# Patient Record
Sex: Female | Born: 1950 | Race: White | Hispanic: No | Smoking: Current every day smoker
Health system: Southern US, Community
[De-identification: ages and names within clinical notes are randomized; demographics above are authoritative.]

## PROBLEM LIST (undated history)

## (undated) ENCOUNTER — Telehealth: Attending: Family | Primary: Family

## (undated) ENCOUNTER — Encounter

## (undated) ENCOUNTER — Ambulatory Visit: Payer: MEDICARE | Attending: Family | Primary: Family

## (undated) ENCOUNTER — Ambulatory Visit: Payer: MEDICARE | Attending: Medical | Primary: Medical

## (undated) ENCOUNTER — Encounter: Attending: Family | Primary: Family

## (undated) ENCOUNTER — Ambulatory Visit: Payer: MEDICARE

## (undated) ENCOUNTER — Telehealth

## (undated) ENCOUNTER — Ambulatory Visit

## (undated) ENCOUNTER — Inpatient Hospital Stay

## (undated) ENCOUNTER — Telehealth: Payer: MEDICARE | Attending: Family | Primary: Family

## (undated) ENCOUNTER — Encounter: Attending: Gastroenterology | Primary: Gastroenterology

## (undated) ENCOUNTER — Ambulatory Visit: Payer: MEDICARE | Attending: Anesthesiology | Primary: Anesthesiology

## (undated) DIAGNOSIS — I10 Essential (primary) hypertension: Secondary | ICD-10-CM

## (undated) DIAGNOSIS — E559 Vitamin D deficiency, unspecified: Secondary | ICD-10-CM

## (undated) DIAGNOSIS — J449 Chronic obstructive pulmonary disease, unspecified: Secondary | ICD-10-CM

## (undated) DIAGNOSIS — R7303 Prediabetes: Secondary | ICD-10-CM

## (undated) DIAGNOSIS — K860 Alcohol-induced chronic pancreatitis: Secondary | ICD-10-CM

## (undated) DIAGNOSIS — B1920 Unspecified viral hepatitis C without hepatic coma: Secondary | ICD-10-CM

## (undated) DIAGNOSIS — Q780 Osteogenesis imperfecta: Secondary | ICD-10-CM

## (undated) DIAGNOSIS — E079 Disorder of thyroid, unspecified: Secondary | ICD-10-CM

## (undated) DIAGNOSIS — E876 Hypokalemia: Secondary | ICD-10-CM

## (undated) DIAGNOSIS — E7800 Pure hypercholesterolemia, unspecified: Secondary | ICD-10-CM

## (undated) DIAGNOSIS — G894 Chronic pain syndrome: Secondary | ICD-10-CM

## (undated) HISTORY — DX: Prediabetes: R73.03

## (undated) HISTORY — PX: PACEMAKER IMPLANT: EP1218

## (undated) HISTORY — PX: APPENDECTOMY: SHX54

## (undated) HISTORY — DX: Alcohol-induced chronic pancreatitis: K86.0

## (undated) HISTORY — DX: Chronic pain syndrome: G89.4

## (undated) HISTORY — DX: Hypokalemia: E87.6

## (undated) HISTORY — PX: TONSILLECTOMY: SUR1361

## (undated) HISTORY — DX: Unspecified viral hepatitis C without hepatic coma: B19.20

## (undated) HISTORY — PX: ABDOMINAL HYSTERECTOMY: SHX81

## (undated) HISTORY — DX: Vitamin D deficiency, unspecified: E55.9

## (undated) HISTORY — PX: ERCP: SHX60

---
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
## 2006-10-23 ENCOUNTER — Emergency Department (HOSPITAL_COMMUNITY): Admission: EM | Admit: 2006-10-23 | Discharge: 2006-10-23 | Payer: Self-pay | Admitting: Emergency Medicine

## 2015-09-06 DIAGNOSIS — J449 Chronic obstructive pulmonary disease, unspecified: Secondary | ICD-10-CM | POA: Insufficient documentation

## 2015-09-06 DIAGNOSIS — I495 Sick sinus syndrome: Secondary | ICD-10-CM

## 2015-09-06 HISTORY — DX: Sick sinus syndrome: I49.5

## 2016-06-06 DIAGNOSIS — M7502 Adhesive capsulitis of left shoulder: Secondary | ICD-10-CM

## 2016-06-06 HISTORY — DX: Adhesive capsulitis of left shoulder: M75.02

## 2016-07-08 DIAGNOSIS — E039 Hypothyroidism, unspecified: Secondary | ICD-10-CM

## 2016-07-08 DIAGNOSIS — H9191 Unspecified hearing loss, right ear: Secondary | ICD-10-CM

## 2016-07-08 HISTORY — DX: Hypothyroidism, unspecified: E03.9

## 2016-07-08 HISTORY — DX: Unspecified hearing loss, right ear: H91.91

## 2017-02-27 ENCOUNTER — Ambulatory Visit (HOSPITAL_COMMUNITY): Payer: Self-pay | Admitting: Psychiatry

## 2017-04-07 ENCOUNTER — Ambulatory Visit
Admission: RE | Admit: 2017-04-07 | Discharge: 2017-04-07 | Disposition: A | Discharge status: Home / Self Care | Payer: TRICARE (CHAMPUS) | Attending: Obstetrics & Gynecology | Admitting: Obstetrics & Gynecology

## 2017-04-07 DIAGNOSIS — Z124 Encounter for screening for malignant neoplasm of cervix: Principal | ICD-10-CM

## 2017-04-20 ENCOUNTER — Ambulatory Visit
Admission: RE | Admit: 2017-04-20 | Discharge: 2017-04-20 | Disposition: A | Discharge status: Home / Self Care | Payer: TRICARE (CHAMPUS) | Attending: Gastroenterology | Admitting: Gastroenterology

## 2017-04-20 DIAGNOSIS — B182 Chronic viral hepatitis C: Principal | ICD-10-CM

## 2017-04-28 MED ORDER — LEDIPASVIR 90 MG-SOFOSBUVIR 400 MG TABLET
ORAL_TABLET | Freq: Every day | ORAL | 2 refills | 0 days | Status: CP
Start: 2017-04-28 — End: 2017-04-30

## 2017-04-29 NOTE — Unmapped (Signed)
Per test claim for Harvoni at the [facility], patient needs Medication Assistance Program for High Cop[place].

## 2017-04-30 ENCOUNTER — Ambulatory Visit: Admission: RE | Admit: 2017-04-30 | Discharge: 2017-04-30 | Payer: TRICARE (CHAMPUS)

## 2017-04-30 DIAGNOSIS — Z1211 Encounter for screening for malignant neoplasm of colon: Principal | ICD-10-CM

## 2017-04-30 MED ORDER — LEDIPASVIR 90 MG-SOFOSBUVIR 400 MG TABLET
ORAL_TABLET | Freq: Every day | ORAL | 2 refills | 0.00000 days | Status: CP
Start: 2017-04-30 — End: 2018-05-04

## 2017-04-30 NOTE — Unmapped (Signed)
Initial Counseling for HCV Treatment     Planned regimen: Harvoni (ledipasvir/sofosbuvir 90/400mg ) x 12 weeks  Planned start date: TBD, pending delivery    Pharmacy: [facility] [phone]    Patient is ready to start Harvoni.     Following topics were discussed during counseling:      1. Indications for medication, dosage and administration.     A. Harvoni 90/400mg  1 tablet to take daily with or without food. Patient plans to take in the mornings.    2. Common side effects of medications and management strategies. (fatigue, headache)    3. Importance of adherence to regimen, follow-up clinic visits and lab monitoring.     A. Asked patient to call [facility] [phone] to establish start date for treatment and to schedule appointment 4 weeks before starting treatment. Also sent a message to [place] [facility] to call patient for follow up appointment.    4. Drug-drug interaction.      A. Current medications have been reviewed and assessed for possible interaction. Endorses Tums prn.  We discussed the mechanism of drug-drug interaction with acid lowering agents. Instructed to separate by at least 4 hours. Advised to check with MD or pharmacist before taking any OTC/herbal medications, with emphasis regarding indigestion/heartburn medications.  Denies use of herbal medication such as milk thistle or St. John's wart.  Allergies have been verified.    5. Importance of informing pharmacy and clinic of updated contact information. Stressed importance of being able to reach over the phone to set up refills. Advised patient to call pharmacy when down to about 7-14 day supply left to ensure there's no interruption in therapy.    Patient verbalized understanding. Provided contact information for any questions/concerns.     Park Breed, Pharm D., BCPS, CGP, CPP  [facility]  [address]  [place], [place] [address]  [phone]

## 2017-05-07 NOTE — Unmapped (Signed)
Reason for call: Patient had questions r/t Harvoni    Genotype? 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 Patient changed start date to 05/13/17 and stated she will take Harvoni in the morning with her other medications. Patient is aware if she needs to take Antacids she will separate by at least 4 hrs.   Patient stated she takes Tylenol PM and was not sure if she could take the medication. Educated patient that she may take up to 2,000 mg of Tylenol per day. Patient is aware that Tylenol can be used for potential headaches. Patient verbalized understanding.     Patient was instructed to call and make follow up appointment with [place] [facility] at [phone]. Patient stated transportation is an issue and her car is in bad shape and she has to pay someone gas money. Patient was hoping she could have labs completed in [facility]. Notified patient that I will address this with the Provider and get back with her.         Vertell Limber RN, BSN  Nursing Care Coordinator   Pharmacy Adult GI Medicine  [facility]  [address]   [place], [place] [address]  [phone]

## 2017-05-08 NOTE — Unmapped (Signed)
Reason for call: Patient returned call for TW #4 follow up questions    HCV   Genotype? 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 Notified patient that the Providers would like her to call Vincenza Hews at phone number [phone] to schedule the TW # 4 office visit. Providers will be able to go over  ultra sound results and provide assessment and labs at time of visit. Notified patient that I see she has [employer]. Instructed patient to call the insurance to see if she quantifies for transportation assistance. Patient stated her husband was vested at the [place] and she continues with the insurance benefits. Stressed the importance of follow up care. Education provided on every 6 months visits for [facility] monitoring. Patient verbalized understanding.         Vertell Limber RN, BSN  Nursing Care Coordinator   Pharmacy Adult GI Medicine  [facility]  [address]   [place], [place] [address]  [phone]

## 2017-05-12 ENCOUNTER — Ambulatory Visit
Admission: RE | Admit: 2017-05-12 | Discharge: 2017-05-12 | Disposition: A | Discharge status: Home / Self Care | Payer: TRICARE (CHAMPUS) | Attending: Obstetrics & Gynecology | Admitting: Obstetrics & Gynecology

## 2017-05-12 ENCOUNTER — Ambulatory Visit
Admission: RE | Admit: 2017-05-12 | Discharge: 2017-05-12 | Disposition: A | Discharge status: Home / Self Care | Payer: TRICARE (CHAMPUS)

## 2017-05-12 DIAGNOSIS — R87610 Atypical squamous cells of undetermined significance on cytologic smear of cervix (ASC-US): Principal | ICD-10-CM

## 2017-05-12 DIAGNOSIS — B182 Chronic viral hepatitis C: Principal | ICD-10-CM

## 2017-05-12 DIAGNOSIS — R87810 Cervical high risk human papillomavirus (HPV) DNA test positive: Secondary | ICD-10-CM

## 2017-05-12 NOTE — Unmapped (Signed)
Colposcopy: What to Expect at Home  Your Recovery  You may feel some soreness in your vagina for a day or two if you had a biopsy. Some vaginal bleeding or discharge is normal for up to a week after a biopsy. The discharge may be dark-colored if a solution was put on your cervix. You can use a sanitary pad for the bleeding.  It may take a week or two for you to get the test results.  This care sheet gives you a general idea about how long it will take for you to recover. But each person recovers at a different pace. Follow the steps below to feel better as quickly as possible.  How can you care for yourself at home?  Activity  ?? ?? You can return to work and most daily activities right after the test.   Exercise  ?? ?? Do not exercise for 1 day after the test.   Medicines  ?? ?? Your doctor will tell you if and when you can restart your medicines. He or she will also give you instructions about taking any new medicines.   ?? ?? If you take blood thinners, such as warfarin (Coumadin), clopidogrel (Plavix), or aspirin, be sure to talk to your doctor. He or she will tell you if and when to start taking those medicines again. Make sure that you understand exactly what your doctor wants you to do.   ?? ?? Take an over-the-counter pain medicine, such as acetaminophen (Tylenol), ibuprofen (Advil, Motrin), or naproxen (Aleve). Be safe with medicines. Read and follow all instructions on the label. Do not take two or more pain medicines at the same time unless the doctor told you to. Many pain medicines have acetaminophen, which is Tylenol. Too much acetaminophen (Tylenol) can be harmful.   Other instructions  ?? ?? Use a pad if you have some bleeding.   ?? ?? Do not douche, have sexual intercourse, or use tampons for 1 week if you had a biopsy. This will allow time for your cervix to heal.   ?? ?? You can take a bath or shower anytime after the test.   Follow-up care is a key part of your treatment and safety. Be sure to make and go to all appointments, and call your doctor if you are having problems. It's also a good idea to know your test results and keep a list of the medicines you take.  When should you call for help?  Call your doctor now or seek immediate medical care if:  ?? ?? You have severe vaginal bleeding. This means that you are soaking through your usual pads or tampons each hour for 2 or more hours.   ?? ?? You have pain that does not get better after you take pain medicine.   ?? ?? You have signs of infection, such as:  ?? Increased pain.  ?? Bad-smelling vaginal discharge.  ?? A fever.   ??Watch closely for any changes in your health, and be sure to contact your doctor if:  ?? ?? You have questions or concerns.   Where can you learn more?  Go to [facility] at [email or web address]  Select Preferences in the upper right hand corner, then select Health Library under Resources. Enter M523 in the search box to learn more about Colposcopy: What to Expect at Home.  Current as of: Feb 15, 2016  Content Version: 11.7  ?? 2006-2018 [employer]. Care instructions adapted under license by [facility]. If  you have questions about a medical condition or this instruction, always ask your healthcare professional. Healthwise, Incorporated disclaims any warranty or liability for your use of this information.

## 2017-05-12 NOTE — Unmapped (Signed)
CC: ASCUS, positive HPV    HPI:   Zoe Rice is a 66 y.o. y/o G3P1021  who presents today for evaluation and management of a Pap smear on 04/07/2017 that showed ASCUS with positive HPV.      Patient has a history of abnormal Pap smears with cryotherapy in the past. We discussed in depth her pap smear result and the connection with the Human Papilloma Virus.  We also discussed the importance of routine follow up and that dysplasia is a condition that can lead to cancer if ignored.  The risks and benefits of the procedure and written informed consent obtained.    Past obstetric, gynecologic, medical, surgical, family, and social history reviewed and updated.     LMP: No LMP recorded. Patient is postmenopausal.     ROS: Otherwise negative across the balance of 10 systems except as noted per HPI.      Physical Exam:  -Vitals:   BP 84/62  - Ht 162.6 cm (5' 4)  - Wt 54.4 kg (120 lb)  - BMI 20.60 kg/m??   Constitutional: Well-developed, well-nourished female in no acute distress  Respiratory: Clear to auscultation bilaterally. Good air movement with normal work of   breathing.  Cardiovascular: Regular rate and rhythm. Extremities grossly normal, nontender with no edema; pulses regular  Gastrointestinal: Soft, nontender, nondistended. No masses or hernias appreciated. No hepatosplenomegaly. No fluid wave. No rebound or guarding.  Genitourinary:     -EGBUS are normal.         -Vagina is pink, well rugated vaginal mucosa, and without gross lesions: yes         -Cervix:           -Pap smear performed today: no  Procedure Note:          -Colposcopy performed with 4% acetic acid.               -Satisfactory: yes               -Lesion(s) Identified:yes date mosaicism               -Location(s): 9 o'clock.               -Biopsy performed at this area, postmenopausal tissue very firm and difficult to biopsy. Hemostasis identified afterwards with monsels               -ECC performed        Assessment/Plan:      Zoe Rice is a 66 y.o. female [phone] with ASCUS, positive HPV. Colposcopy with ECC and directed biopsies were performed today. She'll follow-up next week to review the results.    Chaperone was present for entire exam and procedure

## 2017-05-25 NOTE — Unmapped (Signed)
Follow-Up Counseling for HCV Treatment    Regimen: Harvoni (ledipasvir/sofosbuvir 90/400mg ) x 12 weeks  Start Date: 05/13/17  Completed Treatment Week #2    Pharmacy: [facility] [phone]    Following topics were reviewed during the phone call:  Patient Counseling    Counseled the patient on the following:  doses and administration discussed, possible adverse effects and management discussed, lab monitoring and follow-up discussed, therapeutic rationale discussed, adherence and missed doses discussed, pharmacy contact information discussed         1. Medication administration - Takes Harvoni every morning from 7:30am-8am    2. Importance of adherence -   Medication Adherence    Patient reported X missed doses in the last month:  0  Specialty Medication:  Harvoni  Patient is on additional specialty medications:  No  Patient is on more than two specialty medications:  No  Any gaps in refill history greater than 2 weeks in the last 3 months:  no  Demonstrates understanding of importance of adherence:  yes  Informant:  patient  Reliability of informant:  fairly reliable  Provider-estimated medication adherence level:  76-89%  Patient is at risk for Non-Adherence:  No  Reasons for non-adherence:  patient forgets  Confirmed plan for next specialty medication refill:  outside pharmacy  Medication Assistance Program  Refill Coordination  Shipping Information       Pill count over the phone revealed #16 tablets which is inappropriate, pt should have #14 tablets.  Pt does not recall missing 2 doses and thought she had been 100% adherent.  She currently takes her Harvoni every morning with her other medications, suggested she add a reminder/alarm on her phone.  Also suggested she marks off on her calendar after taking her Harvoni dose to double check that she has taken daily dose.  Pt verbalized understanding and will implement new adherence tools.     3. Side effects - Pt reported initial fatigue and headaches at the start of treatment but both symptoms have now subsided.  Pt reports taking 500mg  of acetaminophen once at the start of treatment for headaches which relieved headache pain, but has not had to take acetaminophen for headaches again.  Suggested patient is drinking enough fluids, and to rest and take short naps if need be.    Adverse Effects    Fatigue:  Pos (Comment: Pt reports fatigue at the start of treatment but symptoms have now subsided.)  Headaches:  Pos (Comment: Pt reports mild headaches but have subsided on its own.  Took 500mg  of acetaminophen one time to relieve symptoms.)         4. Drug-drug interaction -  Pt says she takes 500mg -1,000mg  of acetaminophen before she sleeps every night but is aware not to exceed 2,000mg /day, including if she needs to take acetaminophen for headache. Pt does not plan to need to take acetaminophen again for headaches.  Pt says she had a stomachache unrelated to Harvoni, and took 1 Pepto-Bismol tablet 5 hours after Harvoni dose.  Advised patient that this is appropriate, and confirmed that she has been trying to avoid all antacids/heartburn medications in general.  On another occasion, pt took Doculax in the evening to normalize bowel movement, but says that was a one time dose.  Advised patient that was appropriate, and to maintain a high-fiber diet and drink enough fluids to keep regular bowel movements.   Drug Interactions    Drug interactions evaluated:  yes  Clinically relevant drug interactions identified:  yes  Interactions list:  Tums   Drug management plan:  Instructed to separate by 4 hours.   Provided the patient with educational material regarding drug interactions:  not applicable         5. Follow up - Has follow up appointment scheduled in HCV treatment clinic on 06/10/17 with DNP Owens Shark.  Advised patient to call pharmacy when down to about 7 day supply left to ensure there's no interruption in therapy.  Provided patient with [facility]'s phone number.       All questions were answered. Pt verbalized understanding.     Casimer Bilis, PharmD Candidate  May 26, 2017 2:20 PM    I reviewed the findings, assessment and plan with Pharmacy Student and agree with the findings and plan as documented in the note.  Park Breed, Pharm D., BCPS, BCGP, CPP  [facility]  [address]  [place], [place] [address]  [phone]

## 2017-05-27 ENCOUNTER — Ambulatory Visit
Admission: RE | Admit: 2017-05-27 | Discharge: 2017-05-27 | Payer: TRICARE (CHAMPUS) | Attending: Obstetrics & Gynecology | Admitting: Obstetrics & Gynecology

## 2017-05-27 ENCOUNTER — Ambulatory Visit: Admission: RE | Admit: 2017-05-27 | Discharge: 2017-05-27 | Payer: TRICARE (CHAMPUS)

## 2017-05-27 DIAGNOSIS — Z45018 Encounter for adjustment and management of other part of cardiac pacemaker: Secondary | ICD-10-CM

## 2017-05-27 DIAGNOSIS — R87810 Cervical high risk human papillomavirus (HPV) DNA test positive: Secondary | ICD-10-CM

## 2017-05-27 DIAGNOSIS — Z720 Tobacco use: Secondary | ICD-10-CM

## 2017-05-27 DIAGNOSIS — R0989 Other specified symptoms and signs involving the circulatory and respiratory systems: Principal | ICD-10-CM

## 2017-05-27 DIAGNOSIS — I495 Sick sinus syndrome: Secondary | ICD-10-CM

## 2017-05-27 DIAGNOSIS — R87610 Atypical squamous cells of undetermined significance on cytologic smear of cervix (ASC-US): Principal | ICD-10-CM

## 2017-05-27 NOTE — Unmapped (Signed)
05/27/2017    Zoe Rice date of birth December 29, 1950 follows up for pacemaker placed for sick sinus syndrome in 2004, with generator change out 2017. She has been interrogated since then and the device is functioning fine. She has no complaints of palpitations. She does have dizziness and lightheadedness, reports very variable blood pressures, but even when ill and not able to eat and drink normally she continues to take hydrochlorothiazide 25 mg and lisinopril 40. I suggested she wait till her noon meal to check her blood pressure again and take those if her pressure is above the current readings 110/76. Not sure she needs a diuretic at all given her weight. She is losing weight. She says she stays hydrated but she was orthostatic here in the office. She did not fall or lose her balance, but going from a chair up to the exam table she was lightheaded and the initial blood pressure was low at 98/64.    Echocardiogram done prior to the device change in early 2017 was essentially normal. There was some tricuspid regurgitation and she has likely had it for RV systolic pressure elevation and right sided fluid accumulation. She has some problems with her liver but is following up at [facility]. She started Harvoni for hep C this year. Not sure why there are muscle spasms requiring baclofen. She is also now mildly hypothyroid. She continues to smoke a bit less than a pack a day.     In any case we are making no changes to her blood pressure medicines other than for her to monitor more carefully and to adjust as needed. She is welcome to call anytime there is a question.    On exam, thin 66 year old woman who is alert pleasant and very talkative. She lives alone but manages fine. Initial blood pressure 98/64 in the left arm, on my repeat I got 110/76 in both arms heart sounds are regular chest is slightly coarse but there are no regional variations, no rales rhonchi or wheezes abdomen is flat soft nontender lower extremities no edema. She weighs 118 pounds. She says she is losing weight.    Assessment: Sick sinus syndrome history, status post [employer] dual-chamber pacemaker 2004 with change out 2017.   Hypertension with low readings occasionally possibly volume related.   Normal LV size and function on echo January 2017.   Hepatitis C, being treated.   Hypothyroidism treated.    Plan no change, follow-up with Dr. Rudolpho Sevin in one year and in the device clinic as scheduled

## 2017-05-27 NOTE — Unmapped (Signed)
Chief Complaint   Patient presents with   ??? Follow-up     Colpo results       Zoe Rice is here for Follow-up of colposcopy performed on 05/12/2017. She has no new problems or complaints. We reviewed her biopsy results which show reactive atypia but no dysplasia. There is correlation between her Pap smear in the biopsies. Given these findings, I recommended that we repeat a Pap smear with HPV testing in 1 year. She voiced understanding and understands the importance of follow-up. All questions have been answered.         Current Outpatient Prescriptions:   ???  albuterol (PROVENTIL HFA;VENTOLIN HFA) 90 mcg/actuation inhaler, Inhale 2 puffs every four (4) hours as needed for wheezing or shortness of breath (and cough)., Disp: 54 g, Rfl: 1  ???  alendronate (FOSAMAX) 70 MG tablet, Take 1 tablet (70 mg total) by mouth every seven (7) days., Disp: 12 tablet, Rfl: 1  ???  baclofen (LIORESAL) 10 MG tablet, Take 1 tablet (10 mg total) by mouth every eight (8) hours as needed for muscle spasms., Disp: 90 tablet, Rfl: 0  ???  calcium carbonate (TUMS) 200 mg calcium (500 mg) chewable tablet, Chew 1 tablet (500 mg total) 2 (two) times a day with meals., Disp: 60 tablet, Rfl: 0  ???  cholecalciferol, vitamin D3, 400 unit Tab, Take 1 tablet (400 Units total) by mouth 2 (two) times a day with meals., Disp: 60 tablet, Rfl: 0  ???  gabapentin (NEURONTIN) 300 MG capsule, Take 1 capsule (300 mg total) by mouth Two (2) times a day., Disp: 180 capsule, Rfl: 0  ???  hydroCHLOROthiazide (HYDRODIURIL) 25 MG tablet, Take 1 tablet (25 mg total) by mouth daily., Disp: 90 tablet, Rfl: 1  ???  ledipasvir 90 mg-sofosbuvir 400 mg (HARVONI) tablet, Take 1 tablet by mouth daily., Disp: 28 tablet, Rfl: 2  ???  levothyroxine (SYNTHROID, LEVOTHROID) 25 MCG tablet, Take 1 tablet (25 mcg total) by mouth daily at 0600., Disp: 90 tablet, Rfl: 1  ???  lisinopril (PRINIVIL,ZESTRIL) 40 MG tablet, Take 1 tablet (40 mg total) by mouth daily., Disp: 90 tablet, Rfl: 1  ??? tiotropium bromide 1.25 mcg/actuation Mist, Inhale 2.5 mcg daily., Disp: 225 g, Rfl: 1    Allergies   Allergen Reactions   ??? Antihistamines - Alkylamine Nausea Only   ??? Aspirin Nausea Only     GI upset       Past Medical History:   Diagnosis Date   ??? Abnormal Pap smear of cervix    ??? Abuse, drug or alcohol    ??? Alcoholism (CMS-HCC)    ??? Arthritis    ??? COPD (chronic obstructive pulmonary disease) (CMS-HCC)    ??? Coronary artery disease    ??? GERD (gastroesophageal reflux disease)    ??? Hepatitis C 1972   ??? HPV (human papilloma virus) infection    ??? Hyperlipidemia    ??? Hypertension    ??? Infectious viral hepatitis 1972   ??? Myocardial infarction (CMS-HCC)    ??? Post-menopausal    ??? Shortness of breath    ??? SSS (sick sinus syndrome) (CMS-HCC)        Past Surgical History:   Procedure Laterality Date   ??? APPENDECTOMY     ??? INSERT / REPLACE / REMOVE PACEMAKER  2004   ??? ORIF ANKLE FRACTURE Right    ??? PR REMVL PERM PM PLS GEN W/REPL PLSE GEN 2 LEAD SYS N/A 02/20/2016    Procedure:  dppm c/o DEVICE Radio broadcast assistant;  Surgeon: Sandy Salaam, MD;  Location: [facility] EP;  Service: Cardiology   ??? TONSILLECTOMY     ??? TUBAL LIGATION         OB History   Gravida Para Term Preterm AB Living   3 1 1   2 1    SAB TAB Ectopic Molar Multiple Live Births   1   1            # Outcome Date GA Lbr Len/2nd Weight Sex Delivery Anes PTL Lv   3 SAB            2 Ectopic            1 Term                   Social History     Social History   ??? Marital status: Widowed     Spouse name: N/A   ??? Number of children: N/A   ??? Years of education: N/A     Social History Main Topics   ??? Smoking status: Current Every Day Smoker     Packs/day: 0.50     Years: 50.00     Types: Cigarettes   ??? Smokeless tobacco: Never Used      Comment: Per pt hx of smoking for 50 yrs    ??? 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    ??? Drug use: Yes     Types: Hydrocodone   ??? Sexual activity: Not on file     Other Topics Concern   ??? Not on file     Social History Narrative   ??? No narrative on file       Family History   Problem Relation Age of Onset   ??? Emphysema Mother    ??? Cancer Mother         LUNG   ??? COPD Mother    ??? Hypothyroidism Mother    ??? Heart attack Father    ??? Early death Father    ??? Hypothyroidism Daughter    ??? Hypothyroidism Grandchild          Review Of Systems   REVIEW OF SYSTEMS:  General: no fevers  Skin: no rashes, lesions, itching,  HEENT: no frequent headaches, hearing changes, sore throat, dental problems, sinus problems    Lymph: no tender or swollen lymph nodes   Breasts: no lumps, nipple discharge, breast pain  Resp: no SOB, cough, wheezing  Cardio: no chest pain, palpitations, edema  [id number], bloating, reflux, blood in stool, leakage of stool   GU: no frequency, dysuria, flank pain, hematuria, incontinence, retention        Musk: no weakness, arthralgia, joint swelling, ROM limitations  Endocrine: no polydypsia, polyphagia, polyuria, heat/cold intolerance, hot flashes, dry skin  Psych: no depression, anxiety, panic attacks           PHYSICAL EXAM:  There were no vitals taken for this visit.  Constitutional: Well-developed, well-nourished female in no acute distress    Assessment/Plan:      Zoe Rice is a 66 y.o. female [phone] for ASCUS, positive HPV. Colposcopy shows reactive atypia with no dysplasia. We will repeat a Pap smear with HPV testing in 1 year.    Problem List Items Addressed This Visit     None  Visit Diagnoses     ASCUS with positive high risk HPV cervical    -  Primary          Return in about 1 year (around 05/27/2018) for Annual physical.

## 2017-06-03 NOTE — Unmapped (Signed)
Treatment lab protocol - orders to [facility].  Will mail copy of orders to patient.

## 2017-06-10 ENCOUNTER — Ambulatory Visit
Admission: RE | Admit: 2017-06-10 | Discharge: 2017-06-10 | Disposition: A | Discharge status: Home / Self Care | Payer: TRICARE (CHAMPUS) | Attending: Family | Admitting: Family

## 2017-06-10 DIAGNOSIS — B182 Chronic viral hepatitis C: Principal | ICD-10-CM

## 2017-06-10 DIAGNOSIS — Z5181 Encounter for therapeutic drug level monitoring: Secondary | ICD-10-CM

## 2017-06-10 DIAGNOSIS — R768 Other specified abnormal immunological findings in serum: Secondary | ICD-10-CM

## 2017-06-10 LAB — URINALYSIS
BACTERIA: NONE SEEN /HPF
BILIRUBIN UA: NEGATIVE
BLOOD UA: NEGATIVE
GLUCOSE UA: NEGATIVE
KETONES UA: NEGATIVE
NITRITE UA: NEGATIVE
PH UA: 5.5 (ref 5.0–9.0)
SPECIFIC GRAVITY UA: 1.019 (ref 1.003–1.030)
SQUAMOUS EPITHELIAL: 1 /HPF (ref 0–5)
UROBILINOGEN UA: 0.2
WBC UA: 17 /HPF — ABNORMAL HIGH (ref 0–5)

## 2017-06-10 LAB — BASIC METABOLIC PANEL
ANION GAP: 14 mmol/L (ref 9–15)
BLOOD UREA NITROGEN: 19 mg/dL (ref 7–21)
BUN / CREAT RATIO: 22
CALCIUM: 9.8 mg/dL (ref 8.5–10.2)
CHLORIDE: 103 mmol/L (ref 98–107)
CO2: 24 mmol/L (ref 22.0–30.0)
CREATININE: 0.86 mg/dL (ref 0.60–1.00)
EGFR MDRD AF AMER: >=60 mL/min/{1.73_m2} (ref >=60)
GLUCOSE RANDOM: 99 mg/dL (ref 65–179)
POTASSIUM: 4.2 mmol/L (ref 3.5–5.0)
SODIUM: 141 mmol/L (ref 135–145)

## 2017-06-10 LAB — HEPATIC FUNCTION PANEL
ALKALINE PHOSPHATASE: 81 U/L (ref 38–126)
ALT (SGPT): 28 U/L (ref 15–48)
AST (SGOT): 33 U/L (ref 14–38)
BILIRUBIN DIRECT: 0.4 mg/dL (ref 0.00–0.40)

## 2017-06-10 LAB — TOXICOLOGY SCREEN, URINE
BARBITURATE SCREEN URINE: <200
CANNABINOID SCREEN URINE: <20
COCAINE(METAB.)SCREEN, URINE: <150
METHADONE SCREEN, URINE: <300

## 2017-06-10 LAB — CBC W/ AUTO DIFF
EOSINOPHILS ABSOLUTE COUNT: 0.2 10*9/L (ref 0.0–0.4)
HEMATOCRIT: 42.1 % (ref 36.0–46.0)
HEMOGLOBIN: 14.1 g/dL (ref 12.0–16.0)
LYMPHOCYTES ABSOLUTE COUNT: 2.2 10*9/L (ref 1.5–5.0)
MEAN CORPUSCULAR HEMOGLOBIN: 33.3 pg (ref 26.0–34.0)
MEAN CORPUSCULAR VOLUME: 99.3 fL (ref 80.0–100.0)
MEAN PLATELET VOLUME: 9.9 fL (ref 7.0–10.0)
MONOCYTES ABSOLUTE COUNT: 0.6 10*9/L (ref 0.2–0.8)
NEUTROPHILS ABSOLUTE COUNT: 6.1 10*9/L (ref 2.0–7.5)
PLATELET COUNT: 414 10*9/L (ref 150–440)
RED BLOOD CELL COUNT: 4.24 10*12/L (ref 4.00–5.20)
WBC ADJUSTED: 9.3 10*9/L (ref 4.5–11.0)

## 2017-06-10 LAB — INR: Lab: 0.95

## 2017-06-10 LAB — GLUCOSE RANDOM: Glucose:MCnc:Pt:Ser/Plas:Qn:: 99

## 2017-06-10 LAB — LYMPHOCYTES ABSOLUTE COUNT: Lab: 2.2

## 2017-06-10 LAB — NITRITE UA: Lab: NEGATIVE

## 2017-06-10 LAB — OPIATE SCREEN URINE

## 2017-06-10 LAB — BILIRUBIN TOTAL: Bilirubin:MCnc:Pt:Ser/Plas:Qn:: 0.6

## 2017-06-10 LAB — PROTIME-INR: PROTIME: 10.8 s (ref 10.2–12.8)

## 2017-06-10 NOTE — Unmapped (Addendum)
1.  Laboratory studies done today specifically to check liver enzymes and your hepatitis C viral level.   2.  Office follow up nine weeks, sooner if any concerns. Follow up for end of treatment.   3.  Need to continue to take Harvoni one tablet daily. Do not miss any doses of treatment.  4.  No alcohol use.   5.  Any questions or concern please notify our office.   6.  Will follow up with you about how to proceed with further evaluation of pancreatic finding.   7.  First hepatitis A vaccination given today. Warrant second vaccination in six months.

## 2017-06-10 NOTE — Unmapped (Signed)
[facility]    Woodfin Ganja, M.D.  Assistant Professor of Medicine  [facility]  [place] [facility] [place] [facility] [place]    [phone]    PRIMARY CARE PROVIDER: Legrand Pitts, MD    SUBJECTIVE:     REASON FOR VISIT: Treatment follow up chronic HCV   Harvoni x 12 weeks  Start 05/13/2017   TW #4    HISTORY OF PRESENT ILLNESS:      Zoe Rice is a 66 year old woman with a history of COPD, CAD, HTN, hyperlipidemia, arthritis, GERD, osteoporosis seen in consultation for chronic HCV. She was diagnosed with Non-A, non-B hepatitis when she was 19. She thinks that she contracted HCV via a blood transfusion at age 87 after an accident. She has not used injection drugs. She denies jaundice episodes or GI bleeding.     She presents alone today.  She advises she has missed two doses of Harvoni since starting. She is unsure why this has occurred except that she states her medications got out of priority of taking them.  She states she has moved Harvoni to the top of this list now.  She has experienced headaches.  She has experienced possible potential adverse events inclusive of fatigue and headaches. Noted ten pound weight loss over the past year. Denies any persistent GI symptoms.     She was persistently focused on requesting information involving alcohol absorption rate in the setting of HCV.  July of last year, she was charged with DUI.  She advises she only drank twenty four ounce beer. She is not sure why three hours later her score was 0.10.  She is requesting our office to provide letter of information to her for legal input.  Patient was not established with our office at the time of this event.    Liver Section:   1. Chronic hepatitis C, treatment naive, genotype 1A  Baseline HCV RNA:   11/12/16~ RNA 766,000 IU/mL  HCV RNA TW #4 - Not detected.     2.  Immunity status Hep A and B: + hep B core total, #1 hepatitis A vaccine 06/10/2017  3.  Fibrosure 0.60/F3  4.  Liver screening: abdominal U/S 05/12/2017 - Dilated pancreatic duct with apparent calculus within the pancreatic duct. No gallstones. No hydronephrosis. Abdominal aortic atherosclerosis.  5.  HIV status ~ non reactive    REVIEW OF SYSTEMS:   All systems reviewed and negative except in HPI.    PAST MEDICAL HISTORY:  Past Medical History:   Diagnosis Date   ??? Abnormal Pap smear of cervix    ??? Abuse, drug or alcohol    ??? Alcoholism (CMS-HCC)    ??? Arthritis    ??? COPD (chronic obstructive pulmonary disease) (CMS-HCC)    ??? Coronary artery disease    ??? GERD (gastroesophageal reflux disease)    ??? Hepatitis C 1972   ??? HPV (human papilloma virus) infection    ??? Hyperlipidemia    ??? Hypertension    ??? Infectious viral hepatitis 1972   ??? Myocardial infarction (CMS-HCC)    ??? Post-menopausal    ??? Shortness of breath    ??? SSS (sick sinus syndrome) (CMS-HCC)      Patient Active Problem List    Diagnosis Date Noted   ??? Uncomplicated alcohol dependence (CMS-HCC) 01/24/2017   ??? Intentional drug overdose (CMS-HCC) 01/24/2017   ??? Tobacco use 07/08/2016   ??? Hypercholesterolemia 07/08/2016   ??? Hypertriglyceridemia 07/08/2016   ???  Hearing loss, right 07/08/2016   ??? Perforation of right tympanic membrane 07/08/2016   ??? Hypotension due to drugs 07/08/2016   ??? Labile hypertension 07/08/2016   ??? Chronic fatigue 07/08/2016   ??? Hypothyroidism 07/08/2016   ??? Influenza vaccination declined by patient 06/06/2016   ??? Pneumococcal vaccination declined by patient 06/06/2016   ??? Breast screening declined 06/06/2016   ??? Shortness of breath 06/06/2016   ??? Osteoarthritis of thoracic spine 06/06/2016   ??? Osteopenia 06/06/2016   ??? Pathological fracture of left humerus 06/06/2016   ??? Lumbar degenerative disc disease 06/06/2016   ??? Vertebral osteophyte 06/06/2016   ??? Primary osteoarthritis of left hip 06/06/2016   ??? Adhesive capsulitis of left shoulder 06/06/2016   ??? Primary osteoarthritis of left shoulder 06/06/2016   ??? Chronic pain of left knee 06/06/2016   ??? GERD (gastroesophageal reflux disease) 06/05/2016   ??? Hepatitis C    ??? SSS (sick sinus syndrome) (CMS-HCC) 09/06/2015   ??? Essential hypertension, benign 09/06/2015   ??? COPD (chronic obstructive pulmonary disease) (CMS-HCC) 09/06/2015   ??? Toxic metabolic encephalopathy 09/06/2015   ??? Traumatic closed nondisplaced fracture of distal end of radius 07/19/2015   ??? Pacemaker [employer] 06/12/2015   ??? Bradycardia 06/12/2015       PAST SURGICAL HISTORY:  Past Surgical History:   Procedure Laterality Date   ??? APPENDECTOMY     ??? INSERT / REPLACE / REMOVE PACEMAKER  2004   ??? ORIF ANKLE FRACTURE Right    ??? 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 TONSILLECTOMY     ??? TUBAL LIGATION         MEDICATIONS:  Current Outpatient Prescriptions   Medication Sig Dispense Refill   ??? albuterol (PROVENTIL HFA;VENTOLIN HFA) 90 mcg/actuation inhaler Inhale 2 puffs every four (4) hours as needed for wheezing or shortness of breath (and cough). 54 g 1   ??? alendronate (FOSAMAX) 70 MG tablet Take 1 tablet (70 mg total) by mouth every seven (7) days. 12 tablet 1   ??? baclofen (LIORESAL) 10 MG tablet Take 1 tablet (10 mg total) by mouth every eight (8) hours as needed for muscle spasms. 90 tablet 0   ??? calcium carbonate (TUMS) 200 mg calcium (500 mg) chewable tablet Chew 1 tablet (500 mg total) 2 (two) times a day with meals. 60 tablet 0   ??? cholecalciferol, vitamin D3, 400 unit Tab Take 1 tablet (400 Units total) by mouth 2 (two) times a day with meals. 60 tablet 0   ??? gabapentin (NEURONTIN) 300 MG capsule Take 1 capsule (300 mg total) by mouth Two (2) times a day. 180 capsule 0   ??? hydroCHLOROthiazide (HYDRODIURIL) 25 MG tablet Take 1 tablet (25 mg total) by mouth daily. 90 tablet 1   ??? ledipasvir 90 mg-sofosbuvir 400 mg (HARVONI) tablet Take 1 tablet by mouth daily. 28 tablet 2   ??? levothyroxine (SYNTHROID, LEVOTHROID) 25 MCG tablet Take 1 tablet (25 mcg total) by mouth daily at 0600. 90 tablet 1   ??? lisinopril (PRINIVIL,ZESTRIL) 40 MG tablet Take 1 tablet (40 mg total) by mouth daily. 90 tablet 1   ??? tiotropium bromide 1.25 mcg/actuation Mist Inhale 2.5 mcg daily. 225 g 1     No current facility-administered medications for this visit.  ALLERGIES:  Antihistamines - alkylamine and Aspirin    FAMILY HISTORY:  family history includes COPD in her mother; Cancer in her mother; Early death in her father; Emphysema in her mother; Heart attack in her father; Hypothyroidism in her daughter, grandchild, and mother.     No liver cancer or liver disease in her family     SOCIAL HISTORY:  Social History   Substance Use Topics   ??? Smoking status: Current Every Day Smoker     Packs/day: 0.50     Years: 50.00     Types: Cigarettes   ??? Smokeless tobacco: Never Used      Comment: Per pt hx of smoking for 50 yrs    ??? 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    Currently smoking 1 ppd, since 66 years old    OBJECTIVE:   VITAL SIGNS: BP 141/91  - Pulse 75  - Resp 20  - Ht 162.6 cm (5' 4)  - Wt 54.1 kg (119 lb 3.2 oz)  - SpO2 96%  - BMI 20.46 kg/m??     PHYSICAL EXAM:  Constitutional: Alert, Oriented x 3, No acute distress, slender. Extreme movement during interviewing process.  Inability to sit still and inappropriate facial expresses.   Neuro: No gross neurological deficits.   Mental Status: Manic like behavior.  Unable to stay focused one thought process.       Laboratory results:  Results for orders placed or performed in visit on 06/10/17   Toxicology Screen, Urine   Result Value Ref Range    Amphetamine Screen, Ur <500 ng/mL Not Applicable    Barbiturate Screen, Ur <200 ng/mL Not Applicable    Benzodiazepine Screen, Urine =/>200 ng/mL (A) Not Applicable    Cannabinoid Scrn, Ur <20 ng/mL Not Applicable    Methadone Screen, Urine <300 ng/mL Not Applicable    Cocaine(Metab.)Screen, Urine <150 ng/mL Not Applicable    Opiate Scrn, Ur =/>300 ng/mL (A) Not Applicable   Urinalysis   Result Value Ref Range    Color, UA Yellow     Clarity, UA Cloudy     Specific Gravity, UA 1.019 1.003 - 1.030    pH, UA 5.5 5.0 - 9.0    Leukocyte Esterase, UA Large (A) Negative    Nitrite, UA Negative Negative    Protein, UA Negative Negative    Glucose, UA Negative Negative    Ketones, UA Negative Negative    Urobilinogen, UA 0.2 mg/dL 0.2 mg/dL, 1.0 mg/dL    Bilirubin, UA Negative Negative    Blood, UA Negative Negative    RBC, UA 2 <4 /HPF    WBC, UA 17 (H) 0 - 5 /HPF    Squam Epithel, UA 1 0 - 5 /HPF    Bacteria, UA None Seen None Seen /HPF    Amorphous Crystal, UA Few /HPF    Mucus, UA Rare (A) None Seen /HPF   Basic metabolic panel   Result Value Ref Range    Sodium 141 135 - 145 mmol/L    Potassium 4.2 3.5 - 5.0 mmol/L    Chloride 103 98 - 107 mmol/L    CO2 24.0 22.0 - 30.0 mmol/L    BUN 19 7 - 21 mg/dL    Creatinine 2.84 1.32 - 1.00 mg/dL    BUN/Creatinine Ratio 22     EGFR  MDRD Non Af Amer >=60 >=60 mL/min/1.46m2    EGFR MDRD Af Amer >=60 >=60 mL/min/1.76m2    Anion Gap 14 9 - 15 mmol/L    Glucose 99 65 - 179 mg/dL    Calcium 9.8 8.5 - 16.1 mg/dL   Hepatic Function Panel   Result Value Ref Range    Albumin 4.3 3.5 - 5.0 g/dL    Total Protein 7.9 6.5 - 8.3 g/dL    Total Bilirubin 0.6 0.0 - 1.2 mg/dL    Bilirubin, Direct 0.96 0.00 - 0.40 mg/dL    AST 33 14 - 38 U/L    ALT 28 15 - 48 U/L    Alkaline Phosphatase 81 38 - 126 U/L   PT-INR   Result Value Ref Range    PT 10.8 10.2 - 12.8 sec    INR 0.95    Hepatitis C RNA, Quantitative, PCR   Result Value Ref Range    HCV RNA Not Detected     HCV RNA (IU)  <=0 IU/mL    HCV RNA Log(10)  <0.00 log IU/mL    HCV RNA Comment       Hepatitis C virus RNA quantification is performed using the FDA-approved Abbott RealTime PCR HCV test, targeting the 5' UTR. This test can quantify HCV RNA over the range of 12-100,000,000 IU/mL (1.08 log(10) - 8.0 log(10) IU/mL). Both the limit of detection and limit of quantification are 12 IU/mL. The reference range for this assay is Not Detected.   Hepatitis B Surface Antigen   Result Value Ref Range    Hepatitis B Surface Ag Nonreactive Nonreactive   CBC w/ Differential   Result Value Ref Range    WBC 9.3 4.5 - 11.0 10*9/L    RBC 4.24 4.00 - 5.20 10*12/L    HGB 14.1 12.0 - 16.0 g/dL    HCT 04.5 40.9 - 81.1 %    MCV 99.3 80.0 - 100.0 fL    MCH 33.3 26.0 - 34.0 pg    MCHC 33.5 31.0 - 37.0 g/dL    RDW 91.4 78.2 - 95.6 %    MPV 9.9 7.0 - 10.0 fL    Platelet 414 150 - 440 10*9/L    Absolute Neutrophils 6.1 2.0 - 7.5 10*9/L    Absolute Lymphocytes 2.2 1.5 - 5.0 10*9/L    Absolute Monocytes 0.6 0.2 - 0.8 10*9/L    Absolute Eosinophils 0.2 0.0 - 0.4 10*9/L    Absolute Basophils 0.1 0.0 - 0.1 10*9/L    Large Unstained Cells 2 0 - 4 %    Macrocytosis Slight (A) Not Present       ASSESSMENT AND PLAN:   1. Chronic hepatitis C, treatment naive, genotype1A  Zoe Rice is a 66 year old Caucasian woman with a history of COPD, CAD, HTN, hyperlipidemia, arthritis, GERD, alcohol as well as polysubstance abuse, and osteoporosis. She presents today for treatment follow up ~ TW #4.  She advises she has missed two doses of treatment ~ Harvoni. She is unsure why she missed her doses.  Taking one tablet daily ~ 7 AM-8 AM. She experienced minor potential adverse events inclusive of headaches and fatigue. She took Pepto-bismol one time ~ 5 hours after having taken Harvoni. + alcohol use during treatment. + toxic screen today for opioids and benzos (medications not listed MAR).      ~ HCV safety labs ordered today.   ~ Office follow up nine weeks, sooner if any concerns.   ~ Stressed  importance of taking Harvoni one tablet daily.  Avoid missing any additional doses.   ~ No alcohol use or anti acid therapy during treatment.     2.  History of alcohol and polysubstance abuse ~ + toxic screen today.  Will stress to patient during discussion of results the importance of strict sobriety. + DUI ~ July 2017.     3.  Abdominal GI imaging result ~ Dilated pancreatic duct with apparent calculus within the pancreatic duct. Patient unable to have MRI/MRCP ~ + pacemaker. Given the fact, patient is unable to proceed with MRI/MRCP of abdomen, will address with Dr. Woodfin Ganja recommended imaging measure at this time. CT scan of abdomen with pancreatic protocol versus EUS.     4.  Immunity status Hep A ~ #1 vaccination administered today.     5.  Immunity status Hep B ~ + Hep B core total. Hepatitis B surface antigen ordered in the setting of DAA therapy.     All patient's questions were answered to her satisfaction during office visit today.     Rodman Key, DNP, FNP-BC  [facility]  [id number]  [place] [address]  Phone [phone]

## 2017-06-11 LAB — HEPATITIS B SURFACE ANTIGEN: Hepatitis B virus surface Ag:PrThr:Pt:Ser:Ord:: NONREACTIVE

## 2017-06-11 LAB — HCV RNA COMMENT: Lab: 0

## 2017-06-11 LAB — HEPATITIS C RNA, QUANTITATIVE, PCR

## 2017-06-12 NOTE — Unmapped (Signed)
Left message for patient to call our office to discuss laboratory results.  HCV RNA level not detected at this point in her treatment. + toxic screen.  Will proceed with CT scan of abdomen at this time based on Abd U/S finding.  Patient needs to be scheduled. Requested patient to return my phone call. Order for CT scan has been placed.

## 2017-06-15 NOTE — Unmapped (Signed)
Left message for patient to call me to discuss test results.  I was returning her call in reference to having called her on 06/12/2017.

## 2017-06-23 NOTE — Unmapped (Signed)
Received call from patient: returning call from [facility] regarding  Labs. Please call her with your concerns.

## 2017-06-24 NOTE — Unmapped (Signed)
pt stated she had not received her recent lab results.  reminded pt. that Clinical research associate spoke with her and recalled conversation.  pt stated she thought she was talking to someone else.  went over lab results again.  pt verbalized understanding with no further questions.

## 2017-07-22 ENCOUNTER — Ambulatory Visit
Admission: RE | Admit: 2017-07-22 | Discharge: 2017-07-22 | Disposition: A | Discharge status: Home / Self Care | Payer: TRICARE (CHAMPUS)

## 2017-07-22 DIAGNOSIS — R933 Abnormal findings on diagnostic imaging of other parts of digestive tract: Principal | ICD-10-CM

## 2017-07-26 ENCOUNTER — Ambulatory Visit
Admission: RE | Admit: 2017-07-26 | Discharge: 2017-07-26 | Disposition: A | Discharge status: Home / Self Care | Payer: TRICARE (CHAMPUS)

## 2017-07-26 DIAGNOSIS — R933 Abnormal findings on diagnostic imaging of other parts of digestive tract: Principal | ICD-10-CM

## 2017-07-27 NOTE — Unmapped (Signed)
Follow-Up Counseling for HCV Treatment    Regimen: Harvoni (ledipasvir/sofosbuvir 90/400mg ) x 12 weeks  Start Date: 05/13/17  Completed Treatment Week # 10    Pharmacy: [facility] - [phone]  ??  Following topics were reviewed during the phone call:    1. Medication administration - Takes Harvoni daily.    2. Importance of adherence -   Medication Adherence    Patient Reported X Missed Doses in the Last Month:  0  Specialty Medication:  Harvoni  Informant:  patient  Provider-Estimated Medication Adherence Level:  90-100%  Other Non-Adherence Reason:  forgot 2 days of Harvoni in the first month of treatment  Confirmed Plan for Next Specialty Medication Refill:  not applicable  Medication Assistance Program  Has Prior Authorization Been Obtained:  Yes  Refill Coordination  Shipping Information        Pill count over the phone revealed #10 tablets which is appropriate taking into consideration the 2 missed doses in the first 3 weeks of treatment.    3. Side effects - No new side effects to report.      4. Drug-drug interaction -   Drug Interactions    Drug interactions evaluated:  yes  Clinically relevant drug interactions identified:  yes   Interactions list:  Tums, Mylanta   Drug management plan:  Patient separated by 4 hourss.          5. Follow up - Has follow up appointment scheduled in HCV treatment clinic on 09/14/17.  Informed patient that there is still a small chance of relapse after finishing the treatment. Explained to the patient the importance of a 12-week post-treatment follow-up to check HCV viral levels before determining if cured.    All questions were answered.    Thoren Hosang. Meta Hatchet, PharmD, [facility]  [phone]

## 2017-07-31 NOTE — Unmapped (Signed)
Left message to speak to patient about her CT scan of abdomen findings.  Advised patient I will try to reach her tomorrow or Monday to review everything.

## 2017-08-03 NOTE — Unmapped (Signed)
Spoke with patient about imaging result and findings felt to be consistent with chronic pancreatitis.  Patient is symptomatic with abdominal pain associated with eating as well as nausea.  She wishes to be referred under [facility] system. Advised will need to proceed with seeing who I am able to refer her too and will let her know. Patient voiced understanding and in agreement.

## 2017-08-18 DIAGNOSIS — B182 Chronic viral hepatitis C: Secondary | ICD-10-CM | POA: Insufficient documentation

## 2017-10-08 ENCOUNTER — Ambulatory Visit: Admit: 2017-10-08 | Discharge: 2017-10-08 | Payer: MEDICARE | Attending: Family | Primary: Family

## 2017-10-08 DIAGNOSIS — K861 Other chronic pancreatitis: Secondary | ICD-10-CM

## 2017-10-08 DIAGNOSIS — B182 Chronic viral hepatitis C: Principal | ICD-10-CM

## 2017-10-08 DIAGNOSIS — R768 Other specified abnormal immunological findings in serum: Secondary | ICD-10-CM

## 2017-10-08 DIAGNOSIS — K8689 Other specified diseases of pancreas: Secondary | ICD-10-CM

## 2017-11-03 ENCOUNTER — Ambulatory Visit: Admit: 2017-11-03 | Discharge: 2017-11-04 | Payer: MEDICARE | Attending: Gastroenterology | Primary: Gastroenterology

## 2017-11-03 DIAGNOSIS — R9389 Abnormal findings on diagnostic imaging of other specified body structures: Principal | ICD-10-CM

## 2017-11-03 DIAGNOSIS — K861 Other chronic pancreatitis: Secondary | ICD-10-CM

## 2017-11-03 MED ORDER — OXYCODONE 5 MG CAPSULE
ORAL_CAPSULE | Freq: Four times a day (QID) | ORAL | 0 refills | 0 days | Status: SS
Start: 2017-11-03 — End: 2017-12-08

## 2017-11-09 ENCOUNTER — Encounter: Admit: 2017-11-09 | Discharge: 2017-11-09 | Payer: MEDICARE

## 2017-11-09 ENCOUNTER — Ambulatory Visit: Admit: 2017-11-09 | Discharge: 2017-11-09 | Payer: MEDICARE

## 2017-11-09 DIAGNOSIS — K8689 Other specified diseases of pancreas: Principal | ICD-10-CM

## 2017-11-09 MED ORDER — OXYCODONE 5 MG CAPSULE
ORAL_CAPSULE | Freq: Four times a day (QID) | ORAL | 0 refills | 0 days | Status: CP
Start: 2017-11-09 — End: 2017-11-14

## 2017-12-08 ENCOUNTER — Ambulatory Visit: Admit: 2017-12-08 | Discharge: 2017-12-08 | Payer: MEDICARE

## 2017-12-08 ENCOUNTER — Encounter: Admit: 2017-12-08 | Discharge: 2017-12-08 | Payer: MEDICARE | Attending: Anesthesiology | Primary: Anesthesiology

## 2017-12-08 DIAGNOSIS — K861 Other chronic pancreatitis: Principal | ICD-10-CM

## 2017-12-08 MED ORDER — OXYCODONE 5 MG CAPSULE
ORAL_CAPSULE | Freq: Four times a day (QID) | ORAL | 0 refills | 0.00000 days | Status: CP
Start: 2017-12-08 — End: 2017-12-13

## 2018-02-08 ENCOUNTER — Encounter
Admit: 2018-02-08 | Discharge: 2018-02-08 | Payer: MEDICARE | Attending: Critical Care Medicine | Primary: Critical Care Medicine

## 2018-02-08 ENCOUNTER — Ambulatory Visit: Admit: 2018-02-08 | Discharge: 2018-02-08 | Payer: MEDICARE

## 2018-02-08 DIAGNOSIS — K861 Other chronic pancreatitis: Principal | ICD-10-CM

## 2018-02-08 MED ORDER — OXYCODONE 5 MG TABLET
ORAL_TABLET | Freq: Four times a day (QID) | ORAL | 0 refills | 0.00000 days | Status: CP | PRN
Start: 2018-02-08 — End: 2018-02-18

## 2018-05-03 ENCOUNTER — Ambulatory Visit
Admit: 2018-05-03 | Discharge: 2018-05-05 | Disposition: A | Discharge status: Home / Self Care | Payer: MEDICARE | Source: Other Acute Inpatient Hospital | Admitting: Internal Medicine

## 2018-05-03 ENCOUNTER — Encounter
Admit: 2018-05-03 | Discharge: 2018-05-05 | Disposition: A | Discharge status: Home / Self Care | Payer: MEDICARE | Source: Other Acute Inpatient Hospital | Attending: Anesthesiology | Admitting: Internal Medicine

## 2018-05-03 DIAGNOSIS — K858 Other acute pancreatitis without necrosis or infection: Principal | ICD-10-CM

## 2018-05-04 DIAGNOSIS — K858 Other acute pancreatitis without necrosis or infection: Principal | ICD-10-CM

## 2018-05-05 MED ORDER — OXYCODONE 10 MG TABLET
ORAL_TABLET | ORAL | 0 refills | 0 days | Status: CP | PRN
Start: 2018-05-05 — End: 2018-06-17

## 2018-05-05 MED ORDER — PANTOPRAZOLE 40 MG TABLET,DELAYED RELEASE
ORAL_TABLET | Freq: Every day | ORAL | 0 refills | 0.00000 days | Status: CP
Start: 2018-05-05 — End: 2018-06-17

## 2018-05-05 MED ORDER — LISINOPRIL 40 MG TABLET
ORAL_TABLET | Freq: Every day | ORAL | 0 refills | 0 days
Start: 2018-05-05 — End: 2019-03-21

## 2018-06-08 ENCOUNTER — Ambulatory Visit: Admit: 2018-06-08 | Discharge: 2018-06-09 | Payer: MEDICARE | Attending: Family | Primary: Family

## 2018-06-08 DIAGNOSIS — K861 Other chronic pancreatitis: Principal | ICD-10-CM

## 2018-06-17 ENCOUNTER — Ambulatory Visit: Admit: 2018-06-17 | Discharge: 2018-06-17 | Payer: MEDICARE

## 2018-06-17 ENCOUNTER — Encounter: Admit: 2018-06-17 | Discharge: 2018-06-17 | Payer: MEDICARE

## 2018-06-17 DIAGNOSIS — Z4659 Encounter for fitting and adjustment of other gastrointestinal appliance and device: Principal | ICD-10-CM

## 2018-06-17 MED ORDER — OXYCODONE 5 MG CAPSULE
ORAL_CAPSULE | Freq: Four times a day (QID) | ORAL | 0 refills | 0 days | Status: CP | PRN
Start: 2018-06-17 — End: 2018-06-22

## 2018-08-02 DIAGNOSIS — G894 Chronic pain syndrome: Secondary | ICD-10-CM | POA: Insufficient documentation

## 2018-12-09 DIAGNOSIS — K860 Alcohol-induced chronic pancreatitis: Principal | ICD-10-CM

## 2018-12-09 DIAGNOSIS — K740 Hepatic fibrosis: Principal | ICD-10-CM

## 2018-12-09 DIAGNOSIS — Z8619 Personal history of other infectious and parasitic diseases: Principal | ICD-10-CM

## 2018-12-14 DIAGNOSIS — K860 Alcohol-induced chronic pancreatitis: Principal | ICD-10-CM

## 2018-12-14 DIAGNOSIS — K740 Hepatic fibrosis: Principal | ICD-10-CM

## 2018-12-14 DIAGNOSIS — Z8619 Personal history of other infectious and parasitic diseases: Principal | ICD-10-CM

## 2019-03-01 ENCOUNTER — Institutional Professional Consult (permissible substitution): Admit: 2019-03-01 | Discharge: 2019-03-02 | Payer: MEDICARE | Attending: Family | Primary: Family

## 2019-03-01 DIAGNOSIS — Z8619 Personal history of other infectious and parasitic diseases: Secondary | ICD-10-CM

## 2019-03-01 DIAGNOSIS — K740 Hepatic fibrosis: Principal | ICD-10-CM

## 2019-03-22 ENCOUNTER — Institutional Professional Consult (permissible substitution): Admit: 2019-03-22 | Discharge: 2019-03-23 | Payer: MEDICARE | Attending: Family | Primary: Family

## 2019-10-12 ENCOUNTER — Encounter: Payer: Self-pay | Admitting: Internal Medicine

## 2019-10-18 ENCOUNTER — Other Ambulatory Visit: Payer: Self-pay

## 2019-10-18 ENCOUNTER — Emergency Department (HOSPITAL_BASED_OUTPATIENT_CLINIC_OR_DEPARTMENT_OTHER)
Admission: EM | Admit: 2019-10-18 | Discharge: 2019-10-19 | Disposition: A | Discharge status: Home / Self Care | Payer: Medicare Other | Attending: Emergency Medicine | Admitting: Emergency Medicine

## 2019-10-18 ENCOUNTER — Encounter (HOSPITAL_BASED_OUTPATIENT_CLINIC_OR_DEPARTMENT_OTHER): Payer: Self-pay

## 2019-10-18 DIAGNOSIS — Z950 Presence of cardiac pacemaker: Secondary | ICD-10-CM | POA: Diagnosis not present

## 2019-10-18 DIAGNOSIS — R1012 Left upper quadrant pain: Secondary | ICD-10-CM | POA: Diagnosis present

## 2019-10-18 DIAGNOSIS — K2900 Acute gastritis without bleeding: Secondary | ICD-10-CM

## 2019-10-18 DIAGNOSIS — Z888 Allergy status to other drugs, medicaments and biological substances status: Secondary | ICD-10-CM | POA: Diagnosis not present

## 2019-10-18 DIAGNOSIS — J449 Chronic obstructive pulmonary disease, unspecified: Secondary | ICD-10-CM | POA: Diagnosis not present

## 2019-10-18 DIAGNOSIS — Z886 Allergy status to analgesic agent status: Secondary | ICD-10-CM | POA: Insufficient documentation

## 2019-10-18 DIAGNOSIS — K859 Acute pancreatitis without necrosis or infection, unspecified: Secondary | ICD-10-CM | POA: Insufficient documentation

## 2019-10-18 DIAGNOSIS — Z20822 Contact with and (suspected) exposure to covid-19: Secondary | ICD-10-CM | POA: Diagnosis not present

## 2019-10-18 DIAGNOSIS — F17210 Nicotine dependence, cigarettes, uncomplicated: Secondary | ICD-10-CM | POA: Insufficient documentation

## 2019-10-18 DIAGNOSIS — E079 Disorder of thyroid, unspecified: Secondary | ICD-10-CM | POA: Diagnosis not present

## 2019-10-18 DIAGNOSIS — E782 Mixed hyperlipidemia: Secondary | ICD-10-CM | POA: Diagnosis not present

## 2019-10-18 HISTORY — DX: Osteogenesis imperfecta: Q78.0

## 2019-10-18 HISTORY — DX: Essential (primary) hypertension: I10

## 2019-10-18 HISTORY — DX: Disorder of thyroid, unspecified: E07.9

## 2019-10-18 HISTORY — DX: Pure hypercholesterolemia, unspecified: E78.00

## 2019-10-18 HISTORY — DX: Chronic obstructive pulmonary disease, unspecified: J44.9

## 2019-10-18 NOTE — ED Triage Notes (Signed)
Pt c/o upper abd pain and pain that goes to the back. Pt has had N/V.

## 2019-10-19 ENCOUNTER — Encounter (HOSPITAL_BASED_OUTPATIENT_CLINIC_OR_DEPARTMENT_OTHER): Payer: Self-pay

## 2019-10-19 ENCOUNTER — Emergency Department (HOSPITAL_BASED_OUTPATIENT_CLINIC_OR_DEPARTMENT_OTHER): Payer: Medicare Other

## 2019-10-19 ENCOUNTER — Telehealth (HOSPITAL_BASED_OUTPATIENT_CLINIC_OR_DEPARTMENT_OTHER): Payer: Self-pay | Admitting: Emergency Medicine

## 2019-10-19 DIAGNOSIS — K2900 Acute gastritis without bleeding: Secondary | ICD-10-CM | POA: Diagnosis not present

## 2019-10-19 LAB — CBC
HCT: 45.6 % (ref 36.0–46.0)
Hemoglobin: 15.1 g/dL — ABNORMAL HIGH (ref 12.0–15.0)
MCH: 29.5 pg (ref 26.0–34.0)
MCHC: 33.1 g/dL (ref 30.0–36.0)
MCV: 89.2 fL (ref 80.0–100.0)
Platelets: 288 10*3/uL (ref 150–400)
RBC: 5.11 MIL/uL (ref 3.87–5.11)
RDW: 12.4 % (ref 11.5–15.5)
WBC: 9.1 10*3/uL (ref 4.0–10.5)
nRBC: 0 % (ref 0.0–0.2)

## 2019-10-19 LAB — COMPREHENSIVE METABOLIC PANEL
ALT: 12 U/L (ref 0–44)
AST: 21 U/L (ref 15–41)
Albumin: 3.8 g/dL (ref 3.5–5.0)
Alkaline Phosphatase: 56 U/L (ref 38–126)
Anion gap: 10 (ref 5–15)
BUN: 15 mg/dL (ref 8–23)
CO2: 27 mmol/L (ref 22–32)
Calcium: 9.5 mg/dL (ref 8.9–10.3)
Chloride: 98 mmol/L (ref 98–111)
Creatinine, Ser: 0.92 mg/dL (ref 0.44–1.00)
GFR calc Af Amer: >60 mL/min (ref >60)
GFR calc non Af Amer: >60 mL/min (ref >60)
Glucose, Bld: 127 mg/dL — ABNORMAL HIGH (ref 70–99)
Potassium: 3.8 mmol/L (ref 3.5–5.1)
Sodium: 135 mmol/L (ref 135–145)
Total Bilirubin: 0.5 mg/dL (ref 0.3–1.2)
Total Protein: 7.7 g/dL (ref 6.5–8.1)

## 2019-10-19 LAB — SARS CORONAVIRUS 2 AG (30 MIN TAT): SARS Coronavirus 2 Ag: NEGATIVE

## 2019-10-19 LAB — LIPASE, BLOOD: Lipase: 183 U/L — ABNORMAL HIGH (ref 11–51)

## 2019-10-19 MED ORDER — ALUM & MAG HYDROXIDE-SIMETH 200-200-20 MG/5ML PO SUSP
30.0000 mL | Freq: Once | ORAL | Status: AC
  Administered 2019-10-19: 04:00:00 30 mL via ORAL
  Filled 2019-10-19: qty 30

## 2019-10-19 MED ORDER — FENTANYL CITRATE (PF) 100 MCG/2ML IJ SOLN
50.0000 ug | Freq: Once | INTRAMUSCULAR | Status: AC
  Administered 2019-10-19: 02:00:00 50 ug via INTRAVENOUS
  Filled 2019-10-19: qty 2

## 2019-10-19 MED ORDER — IOHEXOL 300 MG/ML  SOLN
100.0000 mL | Freq: Once | INTRAMUSCULAR | Status: AC
  Administered 2019-10-19: 80 mL via INTRAVENOUS

## 2019-10-19 MED ORDER — SUCRALFATE 1 GM/10ML PO SUSP: 1.0000 g | Freq: Three times a day (TID) | ORAL | 0 refills | Status: DC

## 2019-10-19 MED ORDER — KETOROLAC TROMETHAMINE 30 MG/ML IJ SOLN
15.0000 mg | Freq: Once | INTRAMUSCULAR | Status: AC
  Administered 2019-10-19: 02:00:00 15 mg via INTRAVENOUS
  Filled 2019-10-19: qty 1

## 2019-10-19 NOTE — ED Provider Notes (Signed)
[facility] EMERGENCY DEPARTMENT Provider Note   CSN: [id number] Arrival date & time: 10/18/19  2306     History Chief Complaint  Patient presents with  . Abdominal Pain    Natasha Jacobson is a 69 y.o. female.  The history is provided by the patient.  Abdominal Pain Pain location:  LUQ, RUQ and periumbilical Pain quality: aching and sharp   Pain severity:  Severe Onset quality:  Gradual Duration:  2 days Timing:  Constant Progression:  Unchanged Chronicity: acute on chronic. Context comment:  H/o pancreatitis Relieved by:  Nothing Worsened by:  Nothing Ineffective treatments: home narcotics  Associated symptoms: nausea and vomiting   Associated symptoms: no chest pain, no chills, no cough, no diarrhea, no fever, no flatus and no shortness of breath   Risk factors: not obese   Patient with chronic pancreatitis on home narcotics presents with upper abdominal pain n/v.  The pain started 2 days ago with this episode.       Past Medical History:  Diagnosis Date  . Brittle bone disease   . COPD (chronic obstructive pulmonary disease) ([place])   . High cholesterol   . Hypertension   . Thyroid disease     There are no problems to display for this patient.   Past Surgical History:  Procedure Laterality Date  . ABDOMINAL HYSTERECTOMY    . APPENDECTOMY    . PACEMAKER IMPLANT    . TONSILLECTOMY       OB History   No obstetric history on file.     History reviewed. No pertinent family history.  Social History   Tobacco Use  . Smoking status: Current Every Day Smoker  . Smokeless tobacco: Never Used  Substance Use Topics  . Alcohol use: Not Currently  . Drug use: Not Currently    Home Medications Prior to Admission medications   Medication Sig Start Date End Date Taking? Authorizing Provider  sucralfate (CARAFATE) 1 GM/10ML suspension Take 10 mLs (1 g total) by mouth 4 (four) times daily -  with meals and at bedtime. 10/19/19   Tayt Moyers, MD     Allergies    Buprenorphine and Aspirin  Review of Systems   Review of Systems  Constitutional: Negative for chills and fever.  HENT: Negative for congestion.   Eyes: Negative for visual disturbance.  Respiratory: Negative for cough and shortness of breath.   Cardiovascular: Negative for chest pain.  Gastrointestinal: Positive for abdominal pain, nausea and vomiting. Negative for diarrhea and flatus.  Genitourinary: Negative for difficulty urinating.  Musculoskeletal: Negative for arthralgias.  Neurological: Negative for dizziness.  Psychiatric/Behavioral: Negative for agitation.  All other systems reviewed and are negative.   Physical Exam Updated Vital Signs BP 116/69 (BP Location: Left Arm)   Pulse 74   Temp 97.6 F (36.4 C) (Oral)   Resp 16   Ht '5\' 4"'  (1.626 m)   Wt 50.8 kg   SpO2 93%   BMI 19.22 kg/m   Physical Exam Vitals and nursing note reviewed.  Constitutional:      General: She is not in acute distress.    Appearance: Normal appearance. She is not ill-appearing.  HENT:     Head: Normocephalic and atraumatic.     Nose: Nose normal.  Eyes:     Extraocular Movements: Extraocular movements intact.     Comments: pinpoint  Cardiovascular:     Rate and Rhythm: Normal rate and regular rhythm.     Pulses: Normal pulses.  Heart sounds: Normal heart sounds.  Pulmonary:     Effort: Pulmonary effort is normal.     Breath sounds: Normal breath sounds.  Abdominal:     General: Abdomen is flat. Bowel sounds are normal.     Tenderness: There is no abdominal tenderness. There is no guarding or rebound.  Musculoskeletal:        General: Normal range of motion.     Cervical back: Normal range of motion and neck supple.  Skin:    General: Skin is warm and dry.     Capillary Refill: Capillary refill takes less than 2 seconds.  Neurological:     General: No focal deficit present.     Mental Status: She is alert and oriented to person, place, and time.     Deep  Tendon Reflexes: Reflexes normal.  Psychiatric:        Mood and Affect: Mood normal.        Behavior: Behavior normal.     ED Results / Procedures / Treatments   Labs (all labs ordered are listed, but only abnormal results are displayed) Results for orders placed or performed during the hospital encounter of 10/18/19  SARS Coronavirus 2 Ag (30 min TAT) - Nasal Swab (BD Veritor Kit)   Specimen: Nasal Swab (BD Veritor Kit)  Result Value Ref Range   SARS Coronavirus 2 Ag NEGATIVE NEGATIVE  Lipase, blood  Result Value Ref Range   Lipase 183 (H) 11 - 51 U/L  Comprehensive metabolic panel  Result Value Ref Range   Sodium 135 135 - 145 mmol/L   Potassium 3.8 3.5 - 5.1 mmol/L   Chloride 98 98 - 111 mmol/L   CO2 27 22 - 32 mmol/L   Glucose, Bld 127 (H) 70 - 99 mg/dL   BUN 15 8 - 23 mg/dL   Creatinine, Ser 0.92 0.44 - 1.00 mg/dL   Calcium 9.5 8.9 - 10.3 mg/dL   Total Protein 7.7 6.5 - 8.1 g/dL   Albumin 3.8 3.5 - 5.0 g/dL   AST 21 15 - 41 U/L   ALT 12 0 - 44 U/L   Alkaline Phosphatase 56 38 - 126 U/L   Total Bilirubin 0.5 0.3 - 1.2 mg/dL   GFR calc non Af Amer >60 >60 mL/min   GFR calc Af Amer >60 >60 mL/min   Anion gap 10 5 - 15  CBC  Result Value Ref Range   WBC 9.1 4.0 - 10.5 K/uL   RBC 5.11 3.87 - 5.11 MIL/uL   Hemoglobin 15.1 (H) 12.0 - 15.0 g/dL   HCT 45.6 36.0 - 46.0 %   MCV 89.2 80.0 - 100.0 fL   MCH 29.5 26.0 - 34.0 pg   MCHC 33.1 30.0 - 36.0 g/dL   RDW 12.4 11.5 - 15.5 %   Platelets 288 150 - 400 K/uL   nRBC 0.0 0.0 - 0.2 %   CT ABDOMEN PELVIS W CONTRAST  Result Date: 10/19/2019 CLINICAL DATA:  Upper abdominal pain, nausea vomiting EXAM: CT ABDOMEN AND PELVIS WITH CONTRAST TECHNIQUE: Multidetector CT imaging of the abdomen and pelvis was performed using the standard protocol following bolus administration of intravenous contrast. CONTRAST:  74m OMNIPAQUE IOHEXOL 300 MG/ML  SOLN COMPARISON:  June 12, 2019 FINDINGS: Lower chest: The visualized heart size within  normal limits. No pericardial fluid/thickening. No hiatal hernia. Streaky atelectasis or scarring at both lung bases. Hepatobiliary: The liver is normal in density without focal abnormality.The main portal vein is patent. No evidence  of calcified gallstones, gallbladder wall thickening or biliary dilatation. Pancreas: There appears to be mild fat stranding changes seen around the pancreatic head with diffuse coarse calcifications seen throughout. There is midbody pancreatic ductal dilatation measuring up to 4 cm in length. No pancreatic fluid collections are seen. There is normal pancreatic parenchymal enhancement seen throughout. Spleen: Normal in size without focal abnormality. Adrenals/Urinary Tract: Both adrenal glands appear normal. A 1 cm low-density lesion seen in the upper pole the right kidney. No hydronephrosis or renal calculi. The bladder is fluid-filled and distended. Stomach/Bowel: At the gastric pylorus and gastroduodenal junction there appears to be diffuse wall thickening with hyperenhancement and surrounding mesenteric fat stranding changes. The remainder of the small bowel and colon are unremarkable. There is a moderate to large amount of colonic stool present. Vascular/Lymphatic: There are no enlarged mesenteric, retroperitoneal, or pelvic lymph nodes. Scattered aortic atherosclerotic calcifications are seen without aneurysmal dilatation. Reproductive: The uterus and adnexa are unremarkable. Other: No evidence of abdominal wall mass or hernia. Musculoskeletal: No acute or significant osseous findings. Degenerative changes are most notable at L4-L5. IMPRESSION: Findings suggestive of acute on chronic pancreatitis. Midbody pancreatic ductal dilatation measuring up to 4 mm. No pancreatic pseudocyst or necrosis. Diffuse hyperenhancement with wall thickening at the gastric pylorus and gastroduodenal junction, which could be due to gastritis/duodenitis. Electronically Signed   By: Prudencio Pair M.D.    On: 10/19/2019 02:13    EKG EKG Interpretation  Date/Time:  Tuesday October 18 2019 23:54:18 EST Ventricular Rate:  73 PR Interval:    QRS Duration: 99 QT Interval:  399 QTC Calculation: 440 R Axis:   -31 Text Interpretation: Sinus rhythm Confirmed by Randal Buba, Kieren Ricci ([id number]) on 10/19/2019 1:13:10 AM   Radiology CT ABDOMEN PELVIS W CONTRAST  Result Date: 10/19/2019 CLINICAL DATA:  Upper abdominal pain, nausea vomiting EXAM: CT ABDOMEN AND PELVIS WITH CONTRAST TECHNIQUE: Multidetector CT imaging of the abdomen and pelvis was performed using the standard protocol following bolus administration of intravenous contrast. CONTRAST:  13m OMNIPAQUE IOHEXOL 300 MG/ML  SOLN COMPARISON:  June 12, 2019 FINDINGS: Lower chest: The visualized heart size within normal limits. No pericardial fluid/thickening. No hiatal hernia. Streaky atelectasis or scarring at both lung bases. Hepatobiliary: The liver is normal in density without focal abnormality.The main portal vein is patent. No evidence of calcified gallstones, gallbladder wall thickening or biliary dilatation. Pancreas: There appears to be mild fat stranding changes seen around the pancreatic head with diffuse coarse calcifications seen throughout. There is midbody pancreatic ductal dilatation measuring up to 4 cm in length. No pancreatic fluid collections are seen. There is normal pancreatic parenchymal enhancement seen throughout. Spleen: Normal in size without focal abnormality. Adrenals/Urinary Tract: Both adrenal glands appear normal. A 1 cm low-density lesion seen in the upper pole the right kidney. No hydronephrosis or renal calculi. The bladder is fluid-filled and distended. Stomach/Bowel: At the gastric pylorus and gastroduodenal junction there appears to be diffuse wall thickening with hyperenhancement and surrounding mesenteric fat stranding changes. The remainder of the small bowel and colon are unremarkable. There is a moderate to large amount  of colonic stool present. Vascular/Lymphatic: There are no enlarged mesenteric, retroperitoneal, or pelvic lymph nodes. Scattered aortic atherosclerotic calcifications are seen without aneurysmal dilatation. Reproductive: The uterus and adnexa are unremarkable. Other: No evidence of abdominal wall mass or hernia. Musculoskeletal: No acute or significant osseous findings. Degenerative changes are most notable at L4-L5. IMPRESSION: Findings suggestive of acute on chronic pancreatitis. Midbody pancreatic ductal dilatation measuring  up to 4 mm. No pancreatic pseudocyst or necrosis. Diffuse hyperenhancement with wall thickening at the gastric pylorus and gastroduodenal junction, which could be due to gastritis/duodenitis. Electronically Signed   By: Prudencio Pair M.D.   On: 10/19/2019 02:13    Procedures Procedures (including critical care time)  Medications Ordered in ED Medications  ketorolac (TORADOL) 30 MG/ML injection 15 mg (15 mg Intravenous Given 10/19/19 0137)  fentaNYL (SUBLIMAZE) injection 50 mcg (50 mcg Intravenous Given 10/19/19 0137)  iohexol (OMNIPAQUE) 300 MG/ML solution 100 mL (80 mLs Intravenous Contrast Given 10/19/19 0152)  alum & mag hydroxide-simeth (MAALOX/MYLANTA) 200-200-20 MG/5ML suspension 30 mL (30 mLs Oral Given 10/19/19 0334)    ED Course  I have reviewed the triage vital signs and the nursing notes.  Pertinent labs & imaging results that were available during my care of the patient were reviewed by me and considered in my medical decision making (see chart for details).    She has been sleeping in the room with the covers over her head since she arrived and I had to arouse her to take the history.  She appears to be somnolent and has pinpoint pupils.   I have reviewed her previous lipases and those were over 5000.  Today she is 183.  She is very well appearing and no longer nauseated and wants to drink.  I do not believe she requires admission at this time as she appeared  very comfortable even before any medication was given. Has pancreatitis that is uncomplicated as well as gastritis/duodenitis on CT scan.  As she is on pain management she may take her home pain medications. I will add carafate to help heal the gastritis/duodenitis.    Final Clinical Impression(s) / ED Diagnoses Final diagnoses:  Acute gastritis without hemorrhage, unspecified gastritis type  Acute pancreatitis, unspecified complication status, unspecified pancreatitis type   Return for intractable cough, coughing up blood,fevers >100.4 unrelieved by medication, shortness of breath, intractable vomiting, chest pain, shortness of breath, weakness,numbness, changes in speech, facial asymmetry,abdominal pain, passing out,Inability to tolerate liquids or food, cough, altered mental status or any concerns. No signs of systemic illness or infection. The patient is nontoxic-appearing on exam and vital signs are within normal limits.   I have reviewed the triage vital signs and the nursing notes. Pertinent labs &imaging results that were available during my care of the patient were reviewed by me and considered in my medical decision making (see chart for details).  After history, exam, and medical workup I feel the patient has been appropriately medically screened and is safe for discharge home. Pertinent diagnoses were discussed with the patient. Patient was given return    Rx / DC Orders ED Discharge Orders         Ordered    sucralfate (CARAFATE) 1 GM/10ML suspension  3 times daily with meals & bedtime     10/19/19 0255           Annaya Bangert, MD 10/19/19 [id number]

## 2019-10-19 NOTE — ED Notes (Signed)
This tech informed pt. On need for urine. Pt. States she is unable without something to drink. Per MD npo at this time.

## 2019-10-24 ENCOUNTER — Emergency Department (HOSPITAL_BASED_OUTPATIENT_CLINIC_OR_DEPARTMENT_OTHER): Payer: Medicare Other

## 2019-10-24 ENCOUNTER — Encounter (HOSPITAL_BASED_OUTPATIENT_CLINIC_OR_DEPARTMENT_OTHER): Payer: Self-pay

## 2019-10-24 ENCOUNTER — Other Ambulatory Visit: Payer: Self-pay

## 2019-10-24 ENCOUNTER — Emergency Department (HOSPITAL_BASED_OUTPATIENT_CLINIC_OR_DEPARTMENT_OTHER)
Admission: EM | Admit: 2019-10-24 | Discharge: 2019-10-24 | Disposition: A | Discharge status: Home / Self Care | Payer: Medicare Other | Attending: Emergency Medicine | Admitting: Emergency Medicine

## 2019-10-24 DIAGNOSIS — J449 Chronic obstructive pulmonary disease, unspecified: Secondary | ICD-10-CM | POA: Diagnosis not present

## 2019-10-24 DIAGNOSIS — R109 Unspecified abdominal pain: Secondary | ICD-10-CM | POA: Diagnosis present

## 2019-10-24 DIAGNOSIS — Z950 Presence of cardiac pacemaker: Secondary | ICD-10-CM | POA: Insufficient documentation

## 2019-10-24 DIAGNOSIS — R1013 Epigastric pain: Secondary | ICD-10-CM | POA: Insufficient documentation

## 2019-10-24 DIAGNOSIS — F17210 Nicotine dependence, cigarettes, uncomplicated: Secondary | ICD-10-CM | POA: Diagnosis not present

## 2019-10-24 DIAGNOSIS — I10 Essential (primary) hypertension: Secondary | ICD-10-CM | POA: Diagnosis not present

## 2019-10-24 LAB — CBC WITH DIFFERENTIAL/PLATELET
Abs Immature Granulocytes: 0.02 10*3/uL (ref 0.00–0.07)
Basophils Absolute: 0.1 10*3/uL (ref 0.0–0.1)
Basophils Relative: 1 %
Eosinophils Absolute: 0.2 10*3/uL (ref 0.0–0.5)
Eosinophils Relative: 2 %
HCT: 49.4 % — ABNORMAL HIGH (ref 36.0–46.0)
Hemoglobin: 16.1 g/dL — ABNORMAL HIGH (ref 12.0–15.0)
Immature Granulocytes: 0 %
Lymphocytes Relative: 27 %
Lymphs Abs: 2.5 10*3/uL (ref 0.7–4.0)
MCH: 29.2 pg (ref 26.0–34.0)
MCHC: 32.6 g/dL (ref 30.0–36.0)
MCV: 89.7 fL (ref 80.0–100.0)
Monocytes Absolute: 0.7 10*3/uL (ref 0.1–1.0)
Monocytes Relative: 8 %
Neutro Abs: 5.7 10*3/uL (ref 1.7–7.7)
Neutrophils Relative %: 62 %
Platelets: 373 10*3/uL (ref 150–400)
RBC: 5.51 MIL/uL — ABNORMAL HIGH (ref 3.87–5.11)
RDW: 12.3 % (ref 11.5–15.5)
WBC: 9.3 10*3/uL (ref 4.0–10.5)
nRBC: 0 % (ref 0.0–0.2)

## 2019-10-24 LAB — COMPREHENSIVE METABOLIC PANEL
ALT: 16 U/L (ref 0–44)
AST: 33 U/L (ref 15–41)
Albumin: 4.1 g/dL (ref 3.5–5.0)
Alkaline Phosphatase: 59 U/L (ref 38–126)
Anion gap: 9 (ref 5–15)
BUN: 13 mg/dL (ref 8–23)
CO2: 24 mmol/L (ref 22–32)
Calcium: 9.8 mg/dL (ref 8.9–10.3)
Chloride: 104 mmol/L (ref 98–111)
Creatinine, Ser: 1.04 mg/dL — ABNORMAL HIGH (ref 0.44–1.00)
GFR calc Af Amer: >60 mL/min (ref >60)
GFR calc non Af Amer: 56 mL/min — ABNORMAL LOW (ref >60)
Glucose, Bld: 100 mg/dL — ABNORMAL HIGH (ref 70–99)
Potassium: 3.7 mmol/L (ref 3.5–5.1)
Sodium: 137 mmol/L (ref 135–145)
Total Bilirubin: 0.5 mg/dL (ref 0.3–1.2)
Total Protein: 8 g/dL (ref 6.5–8.1)

## 2019-10-24 LAB — LIPASE, BLOOD: Lipase: 268 U/L — ABNORMAL HIGH (ref 11–51)

## 2019-10-24 MED ORDER — MORPHINE SULFATE (PF) 4 MG/ML IV SOLN
4.0000 mg | Freq: Once | INTRAVENOUS | Status: AC
  Administered 2019-10-24: 4 mg via INTRAVENOUS
  Filled 2019-10-24: qty 1

## 2019-10-24 MED ORDER — IOHEXOL 300 MG/ML  SOLN
100.0000 mL | Freq: Once | INTRAMUSCULAR | Status: AC | PRN
  Administered 2019-10-24: 100 mL via INTRAVENOUS

## 2019-10-24 MED ORDER — SODIUM CHLORIDE 0.9 % IV SOLN
80.0000 mg | Freq: Once | INTRAVENOUS | Status: AC
  Administered 2019-10-24: 80 mg via INTRAVENOUS
  Filled 2019-10-24: qty 80

## 2019-10-24 MED ORDER — PANTOPRAZOLE SODIUM 40 MG IV SOLR
INTRAVENOUS | Status: AC
  Filled 2019-10-24: qty 80

## 2019-10-24 MED ORDER — DICYCLOMINE HCL 20 MG PO TABS: 20.0000 mg | ORAL_TABLET | Freq: Three times a day (TID) | ORAL | 0 refills | Status: DC | PRN

## 2019-10-24 MED ORDER — ONDANSETRON HCL 4 MG/2ML IJ SOLN
4.0000 mg | Freq: Once | INTRAMUSCULAR | Status: AC
  Administered 2019-10-24: 4 mg via INTRAVENOUS
  Filled 2019-10-24: qty 2

## 2019-10-24 MED ORDER — SODIUM CHLORIDE 0.9 % IV BOLUS
1000.0000 mL | Freq: Once | INTRAVENOUS | Status: AC
  Administered 2019-10-24: 1000 mL via INTRAVENOUS

## 2019-10-24 NOTE — Discharge Instructions (Signed)
You were seen in the emergency department today with worsening abdominal pain.  Please continue your medication for gastritis and call your pain management as well as your gastroenterology doctors.

## 2019-10-24 NOTE — ED Notes (Signed)
Attempted IV access and blood draw x 2; unable to obtain.

## 2019-10-24 NOTE — ED Triage Notes (Signed)
Pt arrives to ED POV with reports of chronic pancreatitis states that she was seen here last week for the same.Pt states that she was dx with gastritis and given medication. She reports that she has not been able to eat and has not been taking it like she should

## 2019-10-24 NOTE — ED Provider Notes (Signed)
Emergency Department Provider Note   I have reviewed the triage vital signs and the nursing notes.   HISTORY  Chief Complaint Abdominal Pain   HPI Natasha Jacobson is a 69 y.o. female with PMH of COPD, HLD, and HTN presents to the ED with abdominal pain and nausea worsening over the last 2 days with nausea. She was seen in the ED on 10/19/19 with similar complaint. At that time she was diagnosed with chronic pancreatitis and gastritis. CT scan was reviewed. Patient has filled the Carafate Rx but is not feeling better. Symptoms worsened in the last 2 days. She denies fever, chills, diarrhea. She has had nausea and "spitting up" with very poor PO intake. She has a history of biliary stenting required at [facility] in the past. Denies CP or SOB symptoms. She does not drink EtOH. No radiation of symptoms or modifying factors.    Past Medical History:  Diagnosis Date  . Brittle bone disease   . COPD (chronic obstructive pulmonary disease) (HCC)   . High cholesterol   . Hypertension   . Thyroid disease     There are no problems to display for this patient.   Past Surgical History:  Procedure Laterality Date  . ABDOMINAL HYSTERECTOMY    . APPENDECTOMY    . PACEMAKER IMPLANT    . TONSILLECTOMY      Allergies Buprenorphine and Aspirin  No family history on file.  Social History Social History   Tobacco Use  . Smoking status: Current Every Day Smoker  . Smokeless tobacco: Never Used  Substance Use Topics  . Alcohol use: Not Currently  . Drug use: Not Currently    Review of Systems  Constitutional: No fever/chills Eyes: No visual changes. ENT: No sore throat. Cardiovascular: Denies chest pain. Respiratory: Denies shortness of breath. Gastrointestinal: Positive epigastric abdominal pain. Positive nausea and vomiting.  No diarrhea.  No constipation. Genitourinary: Negative for dysuria. Musculoskeletal: Negative for back pain. Skin: Negative for rash. Neurological: Negative  for headaches, focal weakness or numbness.  10-point ROS otherwise negative.  ____________________________________________   PHYSICAL EXAM:  VITAL SIGNS: ED Triage Vitals  Enc Vitals Group     BP 10/24/19 1051 (!) 165/100     Pulse Rate 10/24/19 1051 83     Resp 10/24/19 1051 18     Temp 10/24/19 1051 97.7 F (36.5 C)     Temp Source 10/24/19 1051 Oral     SpO2 10/24/19 1051 99 %     Weight 10/24/19 1054 113 lb 11.2 oz (51.6 kg)     Height 10/24/19 1054 5\' 4"  (1.626 m)   Constitutional: Alert and oriented. Well appearing and in no acute distress. Eyes: Conjunctivae are normal.  Head: Atraumatic. Nose: No congestion/rhinnorhea. Mouth/Throat: Mucous membranes are moist.   Neck: No stridor.   Cardiovascular: Normal rate, regular rhythm. Good peripheral circulation. Grossly normal heart sounds.   Respiratory: Normal respiratory effort.  No retractions. Lungs CTAB. Gastrointestinal: Soft with diffuse mild tenderness. No focal tenderness, rebound, or guarding. No distention.  Musculoskeletal: No gross deformities of extremities. Neurologic:  Normal speech and language.  Skin:  Skin is warm, dry and intact. No rash noted.   ____________________________________________   LABS (all labs ordered are listed, but only abnormal results are displayed)  Labs Reviewed  COMPREHENSIVE METABOLIC PANEL - Abnormal; Notable for the following components:      Result Value   Glucose, Bld 100 (*)    Creatinine, Ser 1.04 (*)    GFR  calc non Af Amer 56 (*)    All other components within normal limits  LIPASE, BLOOD - Abnormal; Notable for the following components:   Lipase 268 (*)    All other components within normal limits  CBC WITH DIFFERENTIAL/PLATELET - Abnormal; Notable for the following components:   RBC 5.51 (*)    Hemoglobin 16.1 (*)    HCT 49.4 (*)    All other components within normal limits   ____________________________________________  RADIOLOGY  CT ABDOMEN PELVIS W  CONTRAST  Result Date: 10/24/2019 CLINICAL DATA:  Abdominal distension. Large amount of stool in the colon on abdomen radiographs earlier today. EXAM: CT ABDOMEN AND PELVIS WITH CONTRAST TECHNIQUE: Multidetector CT imaging of the abdomen and pelvis was performed using the standard protocol following bolus administration of intravenous contrast. CONTRAST:  OMNIPAQUE IOHEXOL 300 MG/ML  SOLN COMPARISON:  Abdomen radiographs obtained earlier today. Abdomen and pelvis CT dated 10/19/2019. FINDINGS: Lower chest: Stable left lower lobe calcified granuloma. The lungs remain hyperexpanded at the lung bases with mild bullous changes. Stable scarring in the right lower lobe. Normal sized heart with a pacemaker lead with its tip at the right ventricular apex. Hepatobiliary: No focal liver abnormality is seen. No gallstones, gallbladder wall thickening, or biliary dilatation. Pancreas: Stable mild-to-moderate atrophy of the pancreatic body and tail. Coarse pancreatic calcifications are again demonstrated and dilatation of the pancreatic duct is also again demonstrated, measuring 4.6 mm in maximum diameter today. No peripancreatic soft tissue stranding is seen. No visible mass. A mild amount of peripancreatic fluid is demonstrated adjacent to the head and neck of the pancreas and between the head of the pancreas and duodenum, with improvement. Previously demonstrated ill-defined soft tissue stranding in the adjacent peripancreatic fat is improved. The wall thickening and enhancement in the adjacent portion of the duodenum is mildly more prominent. Spleen: Normal in size without focal abnormality. Adrenals/Urinary Tract: Normal appearing adrenal glands. Small bilateral renal cysts. Stable distention of the urinary bladder and prominent bilateral extrarenal pelves. No ureteral dilatation or calculi seen. Stomach/Bowel: Thickened and enhancing medial wall of the 2nd portion of the duodenum with mild progression. Similar  changes involving the gastric pylorus and duodenal bulb are also mildly more prominent. Normal caliber fluid-filled loops of small bowel with mild diffuse mucosal enhancement. Normal caliber colon with prominent stool in the right, transverse and left colon. Surgically absent appendix. Vascular/Lymphatic: Atheromatous arterial calcifications without aneurysm. No enlarged lymph nodes. Reproductive: Uterus and bilateral adnexa are unremarkable. Other: No abdominal wall hernia or abnormality. No abdominopelvic ascites. Musculoskeletal: Lumbar and lower thoracic spine degenerative changes. IMPRESSION: 1. Mildly progressive changes of duodenitis and pyloric gastritis. 2. Decreased peripancreatic edema involving the head and neck of the pancreas. This could be due to edema secondary to the pyloric gastritis and duodenitis. Improving acute pancreatitis with mildly progressive secondary inflammation of the gastric pylorus and duodenitis is also a possibility. 3. Diffuse small bowel wall enhancement without thickening. This could represent mild changes of gastroenteritis. 4. Stable changes of chronic pancreatitis with stable pancreatic ductal dilatation. 5. Prominent stool in the right, transverse and left colon. 6. Mild changes of COPD. Electronically Signed   By: Beckie Salts M.D.   On: 10/24/2019 13:28   DG Abdomen Acute W/Chest  Result Date: 10/24/2019 CLINICAL DATA:  Abdominal pain. Additional history provided by technologist: Abdominal pain for 3 weeks. EXAM: DG ABDOMEN ACUTE W/ 1V CHEST COMPARISON:  CT abdomen/pelvis 10/19/2019, chest radiograph 06/16/2019 FINDINGS: Heart size within normal limits. Aortic  atherosclerosis. Left chest dual lead pacer device. Redemonstrated calcified granulomas within the right lung. No evidence of airspace consolidation. Scarring at the right lung base. No sizable pleural effusion or evidence of pneumothorax. Air distention of what appears to be large bowel within the left  hemiabdomen. Moderate colonic stool burden within the right and left colon. No evidence of free air. No acute bony abnormality. Lower lumbar spondylosis. IMPRESSION: Heart size within normal limits.  Aortic atherosclerosis. No airspace consolidation within the lungs. Scarring at the right lung base. Air distention of what appears to be large bowel within the left hemiabdomen. Moderate colonic stool burden. Consider CT abdomen/pelvis for further evaluation, as clinically warranted. Electronically Signed   By: Kellie Simmering DO   On: 10/24/2019 11:29    ____________________________________________   PROCEDURES  Procedure(s) performed:   Procedures  None ____________________________________________   INITIAL IMPRESSION / ASSESSMENT AND PLAN / ED COURSE  Pertinent labs & imaging results that were available during my care of the patient were reviewed by me and considered in my medical decision making (see chart for details).   Patient with chronic pancreatitis and abdominal pain presents with worsening symptoms over the past 2 days.  Abdominal exam is nonconcerning.  Labs are within normal limits.  CT abdomen pelvis reviewed from 1/13.  Plan for repeat labs this patient has required biliary stenting for gallstone pancreatitis in the past at [facility].  I do not see an indication for repeat CT abdomen pelvis at this time.  We will obtain acute abdomen series to assess for possible free air in the setting of gastritis and possible PUD but overall very low suspicion for this clinically.   CT with worsening gastritis. Pancreas inflammation improved with only slightly elevated lipase. CXR and remaining labs reviewed. Patient feeling "a little better." Appears to have Deliana Avalos history of chronic abdominal pain. No vomiting in the ED or at home. Patient requesting "something for pain" at home. She describes failing Suboxone and "some kind of patch." I do not have stronger pain meds than those to provide. Patient  became very upset that I would not prescribe Percocet for pain. She became tearful and then very angry. I offered bently and encouraged continuing the GERD meds and f/u with pain mgmt and GI as an outpatient.  ____________________________________________  FINAL CLINICAL IMPRESSION(S) / ED DIAGNOSES  Final diagnoses:  Epigastric pain     MEDICATIONS GIVEN DURING THIS VISIT:  Medications  sodium chloride 0.9 % bolus 1,000 mL (0 mLs Intravenous Stopped 10/24/19 1427)  morphine 4 MG/ML injection 4 mg (4 mg Intravenous Given 10/24/19 1240)  ondansetron (ZOFRAN) injection 4 mg (4 mg Intravenous Given 10/24/19 1239)  pantoprazole (PROTONIX) 80 mg in sodium chloride 0.9 % 100 mL IVPB (0 mg Intravenous Stopped 10/24/19 1427)  pantoprazole (PROTONIX) 40 MG injection (  Return to [facility] 10/24/19 1427)  iohexol (OMNIPAQUE) 300 MG/ML solution 100 mL (100 mLs Intravenous Contrast Given 10/24/19 1258)     NEW OUTPATIENT MEDICATIONS STARTED DURING THIS VISIT:  Discharge Medication List as of 10/24/2019  2:15 PM    START taking these medications   Details  dicyclomine (BENTYL) 20 MG tablet Take 1 tablet (20 mg total) by mouth 3 (three) times daily as needed for spasms (abdominal cramping)., Starting Mon 10/24/2019, Print        Note:  This document was prepared using Dragon voice recognition software and may include unintentional dictation errors.  Nanda Quinton, MD, [facility] Emergency Medicine    Rayyan Orsborn, Vonna Kotyk  G, MD 10/24/19 1814

## 2019-10-25 DIAGNOSIS — K8689 Other specified diseases of pancreas: Principal | ICD-10-CM

## 2019-10-28 ENCOUNTER — Ambulatory Visit: Admit: 2019-10-28 | Discharge: 2019-11-01 | Disposition: A | Discharge status: Home / Self Care | Payer: MEDICARE

## 2019-10-28 ENCOUNTER — Encounter
Admit: 2019-10-28 | Discharge: 2019-11-01 | Disposition: A | Discharge status: Home / Self Care | Payer: MEDICARE | Attending: Certified Registered"

## 2019-11-01 MED ORDER — POLYETHYLENE GLYCOL 3350 17 GRAM ORAL POWDER PACKET
PACK | Freq: Two times a day (BID) | ORAL | 0 refills | 30 days | Status: CP
Start: 2019-11-01 — End: 2019-12-01

## 2019-11-01 MED ORDER — ACETAMINOPHEN 500 MG TABLET
ORAL_TABLET | Freq: Three times a day (TID) | ORAL | 0 refills | 5.00000 days
Start: 2019-11-01 — End: ?

## 2019-11-01 MED ORDER — PANTOPRAZOLE 40 MG TABLET,DELAYED RELEASE
ORAL_TABLET | Freq: Every day | ORAL | 0 refills | 30 days | Status: CP
Start: 2019-11-01 — End: 2019-12-01

## 2019-11-01 MED ORDER — OXYCODONE 10 MG TABLET
ORAL_TABLET | ORAL | 0 refills | 5 days | Status: CP | PRN
Start: 2019-11-01 — End: 2019-11-06

## 2019-11-01 MED ORDER — SENNOSIDES 8.6 MG TABLET
ORAL_TABLET | Freq: Two times a day (BID) | ORAL | 0 refills | 30 days | Status: CP
Start: 2019-11-01 — End: 2019-12-01

## 2019-11-01 MED ORDER — NICOTINE (POLACRILEX) 2 MG GUM
BUCCAL | 0 refills | 5 days | PRN
Start: 2019-11-01 — End: 2019-12-01

## 2019-11-01 MED ORDER — ONDANSETRON 8 MG DISINTEGRATING TABLET
ORAL_TABLET | Freq: Three times a day (TID) | ORAL | 0 refills | 7 days | Status: CP | PRN
Start: 2019-11-01 — End: 2019-11-08

## 2019-11-01 MED ORDER — NICOTINE (POLACRILEX) 2 MG BUCCAL LOZENGE
BUCCAL | 0 refills | 5 days | PRN
Start: 2019-11-01 — End: 2019-12-01

## 2019-11-01 MED ORDER — NICOTINE 14 MG/24 HR DAILY TRANSDERMAL PATCH
MEDICATED_PATCH | Freq: Every day | TRANSDERMAL | 0 refills | 28 days
Start: 2019-11-01 — End: ?

## 2019-11-03 ENCOUNTER — Ambulatory Visit

## 2019-11-11 ENCOUNTER — Ambulatory Visit: Payer: Medicare Other | Attending: Internal Medicine

## 2019-11-11 DIAGNOSIS — Z23 Encounter for immunization: Secondary | ICD-10-CM | POA: Insufficient documentation

## 2019-11-11 NOTE — Progress Notes (Signed)
   Covid-19 Vaccination Clinic  Name:  Natasha Jacobson    MRN: [id number] DOB: 1950-11-14  11/11/2019  Natasha Jacobson was observed post Covid-19 immunization for 15 minutes without incidence. She was provided with Vaccine Information Sheet and instruction to access the V-Safe system.   Natasha Jacobson was instructed to call 911 with any severe reactions post vaccine: Marland Kitchen Difficulty breathing  . Swelling of your face and throat  . A fast heartbeat  . A bad rash all over your body  . Dizziness and weakness    Immunizations Administered    Name Date Dose VIS Date Route   Pfizer COVID-19 Vaccine 11/11/2019  2:44 PM 0.3 mL 09/16/2019 Intramuscular   Manufacturer: [employer], [employer]   Lot: [id number]   NDC: [address]-2

## 2019-11-13 ENCOUNTER — Encounter (INDEPENDENT_AMBULATORY_CARE_PROVIDER_SITE_OTHER): Payer: Self-pay

## 2019-11-14 ENCOUNTER — Ambulatory Visit

## 2019-11-17 ENCOUNTER — Other Ambulatory Visit: Payer: Self-pay

## 2019-11-17 ENCOUNTER — Ambulatory Visit (INDEPENDENT_AMBULATORY_CARE_PROVIDER_SITE_OTHER): Payer: Medicare Other | Admitting: Internal Medicine

## 2019-11-17 ENCOUNTER — Encounter: Payer: Self-pay | Admitting: Internal Medicine

## 2019-11-17 ENCOUNTER — Other Ambulatory Visit (INDEPENDENT_AMBULATORY_CARE_PROVIDER_SITE_OTHER): Payer: Medicare Other

## 2019-11-17 VITALS — BP 124/86 | HR 110 | Temp 97.8°F | Ht 64.0 in | Wt 108.0 lb

## 2019-11-17 DIAGNOSIS — K8689 Other specified diseases of pancreas: Secondary | ICD-10-CM

## 2019-11-17 DIAGNOSIS — R1011 Right upper quadrant pain: Secondary | ICD-10-CM | POA: Diagnosis not present

## 2019-11-17 DIAGNOSIS — R10811 Right upper quadrant abdominal tenderness: Secondary | ICD-10-CM

## 2019-11-17 DIAGNOSIS — K860 Alcohol-induced chronic pancreatitis: Secondary | ICD-10-CM | POA: Diagnosis not present

## 2019-11-17 DIAGNOSIS — G894 Chronic pain syndrome: Secondary | ICD-10-CM

## 2019-11-17 LAB — CBC WITH DIFFERENTIAL/PLATELET
Basophils Absolute: 0.1 10*3/uL (ref 0.0–0.1)
Basophils Relative: 0.9 % (ref 0.0–3.0)
Eosinophils Absolute: 0.3 10*3/uL (ref 0.0–0.7)
Eosinophils Relative: 3.3 % (ref 0.0–5.0)
HCT: 44 % (ref 36.0–46.0)
Hemoglobin: 14.6 g/dL (ref 12.0–15.0)
Lymphocytes Relative: 32.5 % (ref 12.0–46.0)
Lymphs Abs: 3 10*3/uL (ref 0.7–4.0)
MCHC: 33.2 g/dL (ref 30.0–36.0)
MCV: 89 fl (ref 78.0–100.0)
Monocytes Absolute: 0.9 10*3/uL (ref 0.1–1.0)
Monocytes Relative: 9.4 % (ref 3.0–12.0)
Neutro Abs: 5 10*3/uL (ref 1.4–7.7)
Neutrophils Relative %: 53.9 % (ref 43.0–77.0)
Platelets: 373 10*3/uL (ref 150.0–400.0)
RBC: 4.94 Mil/uL (ref 3.87–5.11)
RDW: 14 % (ref 11.5–15.5)
WBC: 9.3 10*3/uL (ref 4.0–10.5)

## 2019-11-17 LAB — AMYLASE: Amylase: 171 U/L — ABNORMAL HIGH (ref 27–131)

## 2019-11-17 LAB — COMPREHENSIVE METABOLIC PANEL
ALT: 8 U/L (ref 0–35)
AST: 20 U/L (ref 0–37)
Albumin: 4.3 g/dL (ref 3.5–5.2)
Alkaline Phosphatase: 67 U/L (ref 39–117)
BUN: 19 mg/dL (ref 6–23)
CO2: 26 mEq/L (ref 19–32)
Calcium: 10.1 mg/dL (ref 8.4–10.5)
Chloride: 102 mEq/L (ref 96–112)
Creatinine, Ser: 1.1 mg/dL (ref 0.40–1.20)
GFR: 49.58 mL/min — ABNORMAL LOW (ref >60.00)
Glucose, Bld: 90 mg/dL (ref 70–99)
Potassium: 4.4 mEq/L (ref 3.5–5.1)
Sodium: 135 mEq/L (ref 135–145)
Total Bilirubin: 0.5 mg/dL (ref 0.2–1.2)
Total Protein: 8.2 g/dL (ref 6.0–8.3)

## 2019-11-17 LAB — LIPASE: Lipase: 192 U/L — ABNORMAL HIGH (ref 11.0–59.0)

## 2019-11-17 MED ORDER — OXYCODONE HCL 5 MG PO CAPS: 5.0000 mg | ORAL_CAPSULE | ORAL | 0 refills | Status: AC | PRN

## 2019-11-17 MED ORDER — OXYCODONE HCL 5 MG PO CAPS: 5.0000 mg | ORAL_CAPSULE | ORAL | 0 refills | Status: DC | PRN

## 2019-11-17 NOTE — Assessment & Plan Note (Signed)
Complicated situation having had multiple endoscopic interventions in [facility] through Dr. Jamse Mead.  Most recently a pancreatico gastrostomy to treat pancreatic duct stricture and dilated pancreatic duct.  Since that time she has been having a new right upper quadrant pain and tenderness.  She has not been evaluated since.  I will evaluate with CT abdomen and pelvis and laboratory testing.  She is tender has legitimate pain issues.  Number 30 5mg  oxycodone prescribed for short-term pain relief.  She has been given a pain contract to look at, I am not committing to long-term pain medication but I wanted to review the pain contract at this point.  She has previously been seen in a pain clinic through [facility] but she is dissatisfied with the care there.  Review of the narcotic database does not raise any red flags at this point.   Pending review of the CT scan anticipate discussing with Dr. [facility].  The patient has transportation issues and lack of help with that and care which makes going to [facility] difficult.  I do not know what intervention we might be able to do here I may review with Dr. [facility] as well.  The last note from Dr. Meridee Score indicated that surgical treatment most likely necessary.  She does not seem to have gotten adequate relief with decompression he provided though this is my first meeting with her.  Orders Placed This Encounter  Procedures  . CT Abdomen Pelvis W Contrast  . CBC with Differential/Platelet  . Comprehensive metabolic panel  . Amylase  . Lipase

## 2019-11-17 NOTE — Assessment & Plan Note (Signed)
As per chronic pancreatitis assessment and plan

## 2019-11-17 NOTE — Progress Notes (Signed)
Natasha Jacobson 69 y.o. 1951-03-03 [id number]  Assessment & Plan:   Encounter Diagnoses  Name Primary?  . 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 Pancreatic duct stricture   . RUQ pain   . Right upper quadrant abdominal tenderness without rebound tenderness   . Chronic pain syndrome     Alcohol-induced chronic pancreatitis ([place]) Complicated situation having had multiple endoscopic interventions in [facility] through Dr. Okey Dupre.  Most recently a pancreatico gastrostomy to treat pancreatic duct stricture and dilated pancreatic duct.  Since that time she has been having a new right upper quadrant pain and tenderness.  She has not been evaluated since.  I will evaluate with CT abdomen and pelvis and laboratory testing.  She is tender has legitimate pain issues.  Number 30 5mg  oxycodone prescribed for short-term pain relief.  She has been given a pain contract to look at, I am not committing to long-term pain medication but I wanted to review the pain contract at this point.  She has previously been seen in a pain clinic through [facility] but she is dissatisfied with the care there.  Review of the narcotic database does not raise any red flags at this point.   Pending review of the CT scan anticipate discussing with Dr. Okey Dupre.  The patient has transportation issues and lack of help with that and care which makes going to [facility] difficult.  I do not know what intervention we might be able to do here I may review with Dr. Rush Landmark as well.  The last note from Dr. Okey Dupre indicated that surgical treatment most likely necessary.  She does not seem to have gotten adequate relief with decompression he provided though this is my first meeting with her.  Orders Placed This Encounter  Procedures  . CT Abdomen Pelvis W Contrast  . CBC with Differential/Platelet  . Comprehensive metabolic panel  . Amylase  . Lipase     Pancreatic duct stricture As per chronic pancreatitis assessment and  plan  Chronic pain syndrome Continue current medications short-term prescription for oxycodone prescribed.  No commitment for long-term narcotics at this point.  However sometimes patients with chronic pancreatitis do need those.  We have put her in for a next available appointment with me as well though may need care recommendations or perhaps even procedures prior to that.  I think there is a high likelihood she will need tertiary care again.  Clarify eradication of hepatitis C upon return  I appreciate the opportunity to care for this patient. CC: Concepcion Elk, MD Dr. Gayleen Orem Subjective:   Chief Complaint: Pancreatitis abdominal pain and nausea  HPI This 69 year old white woman is self-referred, she has a history of chronic alcoholic pancreatitis previously cared for by [facility] more recently having had care from Dr. Gayleen Orem at [facility].  Recent history notable for emergency room presentation October 23, 2018, due to upper abdominal pain, showing mildly progressive changes of duodenitis, pyloric gastritis, peripancreatic edema which was decreased, small bowel wall enhancement without thickening and otherwise stable changes of chronic pancreatitis with chronic pancreatic duct dilation on CT scan that date of 10/24/2019.  Lipase was 268.  Normal LFTs.  She communicated with [facility] and underwent ERCP and EUS with EUS guided pancreatico gastrostomy because of a mid pancreatic duct stricture.  6 cm x 8 mm fully covered metal stent was placed into the pancreatic duct through EUS guided pancreatico gastrostomy.  Since that time she struggled with right upper quadrant pain and  tenderness.  She was admitted briefly after that for observation and thought that in addition to postprocedural pain she likely had some opioid induced constipation as well.  She used up to 30 10 mg oxycodone she was prescribed at that time.  Current issues are this new right upper quadrant pain and tenderness that is been  an issue off and on since the procedure.  It can be postprandial and can be something that occurs spontaneously without eating.  In general if she is bent over it helps.  She wonders if it is related to her stent.  She has a chronic intermittent but relatively frequent nightly stabbing left upper quadrant pain and some back pain.  She takes Creon which she does not think helps her pain but thinks helps her maintain weight though there is no history of stay had a Rea.  If anything she is "a little constipated".  Appetite comes and goes sometimes she is afraid to eat because of the pain.  Weight is down some.  Nausea frequently associated with pain often no vomiting.  She has been abstinent from alcohol for 8 years in April.  First Covid vaccine February 5 second due March 2, Pfizer   Wt Readings from Last 3 Encounters:  11/17/19 108 lb (49 kg)  10/24/19 113 lb 11.2 oz (51.6 kg)  10/18/19 112 lb (50.8 kg)   Previous interventions have included multiple ERCPs at [facility] that include pancreatic sphincterotomy and clearance of pancreatic duct stones  Allergies  Allergen Reactions  . Buprenorphine Nausea Only  . Aspirin Nausea Only    GI upset GI upset    Current Meds  Medication Sig  . acetaminophen (TYLENOL) 500 MG tablet Take 2 tablets by mouth every 8 (eight) hours as needed.  Marland Kitchen amLODipine (NORVASC) 5 MG tablet Take 1 tablet by mouth daily.  . baclofen (LIORESAL) 10 MG tablet Take 1 tablet by mouth daily as needed.  . dicyclomine (BENTYL) 20 MG tablet Take 1 tablet (20 mg total) by mouth 3 (three) times daily as needed for spasms (abdominal cramping).  . fenofibrate (TRICOR) 48 MG tablet Take 1 tablet by mouth daily.  Marland Kitchen gabapentin (NEURONTIN) 300 MG capsule Take 1 capsule by mouth 3 (three) times daily.  Marland Kitchen levothyroxine (SYNTHROID) 25 MCG tablet Take 1 tablet by mouth daily.  . Melatonin 5 MG CAPS Take 2 capsules by mouth at bedtime.  . Pancrelipase, Lip-Prot-Amyl, (CREON) 24000-76000 units  CPEP Take 1 capsule by mouth 3 (three) times daily.  . sucralfate (CARAFATE) 1 g tablet Take 1 g by mouth in the morning, at noon, in the evening, and at bedtime.  . Tiotropium Bromide Monohydrate (SPIRIVA RESPIMAT) 1.25 MCG/ACT AERS Inhale 2 puffs into the lungs daily.   Past Medical History:  Diagnosis Date  . 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 Alcohol-induced chronic pancreatitis (HCC)   . Brittle bone disease   . Chronic pain syndrome   . COPD (chronic obstructive pulmonary disease) (HCC)   . Hearing loss, right 07/08/2016  . Hepatitis C    Eradicated with antiviral treatment per patient  . High cholesterol   . Hypertension   . Hypopotassemia   . Hypothyroidism 07/08/2016  . Prediabetes   . SSS (sick sinus syndrome) (HCC) 09/06/2015  . Thyroid disease   . Vitamin D deficiency    Past Surgical History:  Procedure Laterality Date  . ABDOMINAL HYSTERECTOMY    . APPENDECTOMY    .  ERCP     multiple with stents placed   . PACEMAKER IMPLANT    . TONSILLECTOMY     Social History   Social History Narrative   Widowed 1 daughter daughter lives in [place]   Income through [place] survivor pension and Social Security survivor benefits   Smoker has not been able to quit   No alcohol since April 2013   No drug use no other tobacco   family history includes Emphysema in her mother; Heart attack in her brother; Heart block in her father; Lung cancer in her mother; Thyroid disease in her daughter, granddaughter, and mother.   Review of Systems As per HPI.  All other review of systems negative  Objective:   Physical Exam @BP  124/86 (BP Location: Left Arm, Patient Position: Sitting, Cuff Size: Normal)   Pulse (!) 110   Temp 97.8 F (36.6 C)   Ht 5\' 4"  (1.626 m)   Wt 108 lb (49 kg)   SpO2 92%   BMI 18.54 kg/m @  General:  Petite chronically ill-appearing thin white woman no acute distress Eyes:  anicteric. Neck:   supple w/o  thyromegaly or mass.  Lungs: Clear to auscultation bilaterally. Heart:   S1S2, no rubs, murmurs, gallops. Abdomen:  soft,tender in right upper quadrant without mass, no hepatosplenomegaly, hernia, or mass and BS+.  Lymph:  no cervical or supraclavicular adenopathy. Extremities:   no edema, cyanosis or clubbing Skin   no rash. Neuro:  A&O x 3.  Psych:  appropriate mood and  Affect.  Is anxious   Data Reviewed:  See HPI for extensive review of [facility] records [facility] records over the last several years and [place] records this year and last year includes imaging endoscopic procedures labs

## 2019-11-17 NOTE — Assessment & Plan Note (Signed)
Continue current medications short-term prescription for oxycodone prescribed.  No commitment for long-term narcotics at this point.  However sometimes patients with chronic pancreatitis do need those.

## 2019-11-17 NOTE — Patient Instructions (Addendum)
You have been scheduled for a CT scan of the abdomen and pelvis at [facility]. You are scheduled on 11/23/2019 at [facility] should arrive 15 minutes prior to your appointment time for registration. Please follow the written instructions below on the day of your exam:  WARNING: IF YOU ARE ALLERGIC TO IODINE/X-RAY DYE, PLEASE NOTIFY RADIOLOGY IMMEDIATELY AT [phone]! YOU WILL BE GIVEN A 13 HOUR PREMEDICATION PREP.  1) Do not eat or drink anything after 5:00AM (4 hours prior to your test) 2) You have been given 2 bottles of oral contrast to drink. The solution may taste better if refrigerated, but do NOT add ice or any other liquid to this solution. Shake well before drinking.    Drink 1 bottle of contrast @ 7:00AM (2 hours prior to your exam)  Drink 1 bottle of contrast @ 8:00AM (1 hour prior to your exam)  You may take any medications as prescribed with a small amount of water, if necessary. If you take any of the following medications: METFORMIN, GLUCOPHAGE, GLUCOVANCE, AVANDAMET, RIOMET, FORTAMET, [place] MET, JANUMET, GLUMETZA or METAGLIP, you MAY be asked to HOLD this medication 48 hours AFTER the exam.  The purpose of you drinking the oral contrast is to aid in the visualization of your intestinal tract. The contrast solution may cause some diarrhea. Depending on your individual set of symptoms, you may also receive an intravenous injection of x-ray contrast/dye.  This test typically takes 30-45 minutes to complete.   ______________________________________________________________________  Your provider has requested that you go to the basement level for lab work before leaving today. Press "B" on the elevator. The lab is located at the first door on the left as you exit the elevator.  Due to recent changes in healthcare laws, you may see the results of your imaging and laboratory studies on MyChart before your provider has had a chance to review them.  We understand that in  some cases there may be results that are confusing or concerning to you. Not all laboratory results come back in the same time frame and the provider may be waiting for multiple results in order to interpret others.  Please give [place] 48 hours in order for your provider to thoroughly review all the results before contacting the office for clarification of your results.     We have sent the following medications to your pharmacy for you to pick up at your convenience: Oxycodone  We are giving you a copy of the pain contract to take with you and read over.   We want to see you back on 12/20/2019 at 11:10 AM.   I appreciate the opportunity to care for you. Silvano Rusk, MD, [facility]

## 2019-11-21 ENCOUNTER — Telehealth: Payer: Self-pay | Admitting: Internal Medicine

## 2019-11-21 NOTE — Telephone Encounter (Signed)
That is the next step in evaluation.  She will still likely need to go back to [facility] as well but was trying to get CT scan here for her.

## 2019-11-21 NOTE — Telephone Encounter (Signed)
Pt is scheduled for a CT 11/23/19 and is concerned about drinking the contrast.  She stated that it is painful to eat or drink.  Pt would like to discuss.

## 2019-11-21 NOTE — Telephone Encounter (Signed)
Highly anxious patient who is very concerned about the contrast causing significant pain. I spoke with the patient and told the patient the benefits of the study outweighed the risk of possible pain from ingestion of the contrast. The patient continued to be concerned during the conversation despite sound advise from the RN. The patient continued to talk about her concerns she mentioned from online sources giving information about the contrast. The patient was given the number to radiology scheduling to call and reschedule CT if she desired to do so.  Note: The patient was heavily leaning toward "postponing" her CT scan due to fear of pancreatic pain from drinking the CT contrast.

## 2019-11-23 ENCOUNTER — Ambulatory Visit (HOSPITAL_BASED_OUTPATIENT_CLINIC_OR_DEPARTMENT_OTHER): Payer: Medicare Other

## 2019-11-29 ENCOUNTER — Telehealth: Payer: Self-pay | Admitting: Internal Medicine

## 2019-11-29 ENCOUNTER — Ambulatory Visit (HOSPITAL_BASED_OUTPATIENT_CLINIC_OR_DEPARTMENT_OTHER)

## 2019-11-29 NOTE — Telephone Encounter (Signed)
Pt called requesting to speak with Dr. Leone Payor about ct scan that she is scheduled for this coming Friday, she does not think that it is necessary now but she would like to explain this directly to him so it is not misinterpreted.

## 2019-11-29 NOTE — Telephone Encounter (Signed)
Dr Leone Payor the pt prefers to speak directly with you

## 2019-11-29 NOTE — Telephone Encounter (Signed)
She is feeling much better so we can cancel the CT scan for Friday  She will keep 3/16 appointment but cancel if feeling ok in the days before  She has another procedure with Dr. Edyth Gunnels for early April  No need to contact patient

## 2019-11-30 NOTE — Telephone Encounter (Signed)
CT scan cancelled.  

## 2019-12-02 ENCOUNTER — Other Ambulatory Visit (HOSPITAL_BASED_OUTPATIENT_CLINIC_OR_DEPARTMENT_OTHER)

## 2019-12-06 ENCOUNTER — Ambulatory Visit: Payer: Medicare Other | Attending: Internal Medicine

## 2019-12-06 DIAGNOSIS — Z23 Encounter for immunization: Secondary | ICD-10-CM

## 2019-12-06 NOTE — Progress Notes (Signed)
   Covid-19 Vaccination Clinic  Name:  Natasha Jacobson    MRN: [id number] DOB: December 09, 1950  12/06/2019  [name] was observed post Covid-19 immunization for 15 minutes without incident. She was provided with Vaccine Information Sheet and instruction to access the V-Safe system.   [name] was instructed to call 911 with any severe reactions post vaccine: Marland Kitchen Difficulty breathing  . Swelling of face and throat  . A fast heartbeat  . A bad rash all over body  . Dizziness and weakness   Immunizations Administered    Name Date Dose VIS Date Route   Pfizer COVID-19 Vaccine 12/06/2019  2:49 PM 0.3 mL 09/16/2019 Intramuscular   Manufacturer: [employer], [employer]   Lot: [id number]   NDC: [address]-9

## 2019-12-09 ENCOUNTER — Telehealth: Payer: Self-pay | Admitting: Internal Medicine

## 2019-12-09 NOTE — Telephone Encounter (Signed)
The patient talked about her entire history and essentially asked after being redirected a couple of times to try to keep her focused if she needed to keep her appointment that is scheduled on 3/16. The patient was told to keep her appointment due to the difficulty of trying to figure out exactly what the patient needed.

## 2019-12-09 NOTE — Telephone Encounter (Signed)
Left message for the patient to call back.

## 2019-12-09 NOTE — Telephone Encounter (Signed)
Patient called with concerns about her medical hx and states Dr.Gessner spoke with her in reference to pain management. She is not sure if she should keep the upcoming appt on 12/20/19.

## 2019-12-20 ENCOUNTER — Encounter: Payer: Self-pay | Admitting: Internal Medicine

## 2019-12-20 ENCOUNTER — Ambulatory Visit (INDEPENDENT_AMBULATORY_CARE_PROVIDER_SITE_OTHER): Payer: Medicare Other | Admitting: Internal Medicine

## 2019-12-20 VITALS — BP 170/100 | HR 80 | Temp 98.2°F | Ht 64.5 in | Wt 108.2 lb

## 2019-12-20 DIAGNOSIS — K8689 Other specified diseases of pancreas: Secondary | ICD-10-CM

## 2019-12-20 DIAGNOSIS — K860 Alcohol-induced chronic pancreatitis: Secondary | ICD-10-CM | POA: Diagnosis not present

## 2019-12-20 DIAGNOSIS — F411 Generalized anxiety disorder: Secondary | ICD-10-CM | POA: Insufficient documentation

## 2019-12-20 DIAGNOSIS — B182 Chronic viral hepatitis C: Secondary | ICD-10-CM | POA: Diagnosis not present

## 2019-12-20 DIAGNOSIS — R195 Other fecal abnormalities: Secondary | ICD-10-CM

## 2019-12-20 DIAGNOSIS — G894 Chronic pain syndrome: Secondary | ICD-10-CM | POA: Diagnosis not present

## 2019-12-20 MED ORDER — OXYCODONE HCL 5 MG PO TABS: 5.0000 mg | ORAL_TABLET | ORAL | 0 refills | Status: AC | PRN

## 2019-12-20 NOTE — Patient Instructions (Addendum)
We have sent the following medications to your pharmacy for you to pick up at your convenience: Oxycodone   We are giving you information on Behavioral medicine. You have to call them to set up an appointment. Phone # [phone].   We will work on a referral to pain management.    I appreciate the opportunity to care for you. Stan Head, MD, [facility]

## 2019-12-20 NOTE — Progress Notes (Signed)
Natasha Jacobson 69 y.o. [birth year] [id number]  Assessment & Plan:   Encounter Diagnoses  Name Primary?  . Alcohol-induced chronic pancreatitis ([place]) Yes  . Pancreatic duct stricture   . Chronic hepatitis C without hepatic coma ([place])   . Chronic pain syndrome   . Generalized anxiety disorder   . Heme + stool iFOBT     Complicated situation. For pancreatic duct stricture intervention with stent removal/replacement (pancreaticogastrosteomy) 4/8 at [facility], Dr. Lysle Rubens Needs better pain management but complexity is beyond my skill set.  Short-term oxycodone Rx provided  Will refer to pain management and send letter to explain her issues  Have asked her to seek help from psychiatry for anxiety - Behavioral Health number given for her to call  No colonoscopy now - see about it after next pancreatic intervention  She says she has had HCV eradicated  Will need confirmatory RNA level unless she can provide  She has latched onto me to try to help her which I want to do but have again explained that the complexity of her problems requires other input.  Meds ordered this encounter  Medications  . oxyCODONE (OXY IR/ROXICODONE) 5 MG immediate release tablet    Sig: Take 1 tablet (5 mg total) by mouth every 4 (four) hours as needed for up to 7 days (may take 2 severe pain).    Dispense:  40 tablet    Refill:  0   After her visit I was able to see that she was denied further narcotics from Dr. Evelene Croon of [facility] because she took oxycodone from a friend and had violated her pain management contract 2x   "A/P 06/30/2019: Patient with chronic abdominal pain on subutex. She has now violated her opioid contract X2. Despite warnings. She recently admittedly took a friends oxycodone. This is documented in ED chart on 9/5 and also with UDS performed at the time of that visit. She in the past used valium despite not having a prescription for this medication. Will no longer prescribe  controlled substances. Patient also with TCA on UDS. No active script for TCA. Will not offer any medications at this time. Patient would be better off seeking care with addiction medicine clinic. She states she is not interested in receiving this type of care including psychiatry."   Will refer to Dr. Brandy Hale at [facility], Opal Sidles, MD Gayleen Orem, MD Kirstie Mirza, FNP    Subjective:   Chief Complaint: pancreatitis, abdominal pain  HPI Natasha Jacobson is here for follow-up, I met her on November 17, 2019 after she had undergone complex EUS and ERCP on October 28, 2019 at [facility], by Dr. Okey Dupre.  She had a pancreatico gastrostomy performed by EUS with stenting using 2 stents.  She had improved and then she was having pain when she saw me and I was going to have her do a CT scan but her pain resolved so we canceled the CT scan.  I had prescribed 35 mg oxycodone on February 11.  She has used those up and is still having some intermittent pain at night that requires narcotics to relieve she says.  She has had a terrible experience with a pain clinic before and is dissatisfied.  Buprenorphine was prescribed made her terrible nauseous and sleepy and they would not use other narcotics.  She is admittedly anxious overall this and has been told to see a psychiatrist before but the way it was presented to her made her think that  they thought she needed that for her pain management and that she was imagining things.  She relates to me how she very much enjoys getting out and working in the yard she has 7 acres of land, and wants to be able to do things but she has held back by her health conditions and lack of what she perceives is adequate pain management.  She continues to be abstinent from alcohol for many years.   Wt Readings from Last 3 Encounters:  12/20/19 108 lb 4 oz (49.1 kg)  11/17/19 108 lb (49 kg)  10/24/19 113 lb 11.2 oz (51.6 kg)    She relates that she thinks she needs a  colonoscopy but does not want to do that now.  She had a positive I FOBT in the fall 2020 actually twice August and September I am not sure why it was repeated CBC was NL then except mild elevation of PLT's and was all NL 11/17/19  She completed Covid vaccine 12/06/19  She has had bad experience with other local gastroenterologist to when called for consultation in the hospitals declined to see her she says. Allergies  Allergen Reactions  . Buprenorphine Nausea Only  . Aspirin Nausea Only    GI upset GI upset    Current Meds  Medication Sig  . acetaminophen (TYLENOL) 500 MG tablet Take 2 tablets by mouth every 8 (eight) hours as needed.  Marland Kitchen albuterol (VENTOLIN HFA) 108 (90 Base) MCG/ACT inhaler Inhale 2 puffs into the lungs every 4 (four) hours as needed.  Marland Kitchen amLODipine (NORVASC) 5 MG tablet Take 1 tablet by mouth daily.  . baclofen (LIORESAL) 10 MG tablet Take 1 tablet by mouth daily as needed.  . ergocalciferol (VITAMIN D2) 1.25 MG (50000 UT) capsule Take 1 capsule by mouth once a week.  . fenofibrate (TRICOR) 48 MG tablet Take 1 tablet by mouth daily.  Marland Kitchen gabapentin (NEURONTIN) 300 MG capsule Take 1 capsule by mouth 3 (three) times daily.  Marland Kitchen levothyroxine (SYNTHROID) 50 MCG tablet Take 50 mcg by mouth daily before breakfast.  . Melatonin 5 MG CAPS Take 2 capsules by mouth at bedtime.  Marland Kitchen oxyCODONE (OXY IR/ROXICODONE) 5 MG immediate release tablet Take 1 tablet (5 mg total) by mouth every 4 (four) hours as needed for up to 7 days (may take 2 severe pain).  . Pancrelipase, Lip-Prot-Amyl, (CREON) 24000-76000 units CPEP Take 1 capsule by mouth 3 (three) times daily.  . pantoprazole (PROTONIX) 40 MG tablet Take 40 mg by mouth daily.  . polyethylene glycol powder (GLYCOLAX/MIRALAX) 17 GM/SCOOP powder Take 17 g by mouth as needed.  . senna (SENOKOT) 8.6 MG TABS tablet Take 1 tablet by mouth as needed for mild constipation.  . Tiotropium Bromide Monohydrate (SPIRIVA RESPIMAT) 1.25 MCG/ACT AERS  Inhale 2 puffs into the lungs daily.  . [DISCONTINUED] oxyCODONE (OXY IR/ROXICODONE) 5 MG immediate release tablet Take 5 mg by mouth every 4 (four) hours as needed.   Past Medical History:  Diagnosis Date  . 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 Alcohol-induced chronic pancreatitis ([place])   . Brittle bone disease   . Chronic pain syndrome   . COPD (chronic obstructive pulmonary disease) ([place])   . Hearing loss, right 07/08/2016  . Hepatitis C    Eradicated with antiviral treatment per patient  . High cholesterol   . Hypertension   . Hypopotassemia   . Hypothyroidism 07/08/2016  . Prediabetes   . SSS (sick sinus  syndrome) ([place]) 09/06/2015  . Thyroid disease   . Vitamin D deficiency    Past Surgical History:  Procedure Laterality Date  . ABDOMINAL HYSTERECTOMY    . APPENDECTOMY    . ERCP     multiple with stents placed   . PACEMAKER IMPLANT    . TONSILLECTOMY     Social History   Social History Narrative   Widowed 1 daughter daughter lives in [place] through [place] survivor pension and Social Security survivor benefits   Smoker has not been able to quit   No alcohol since April 2013   No drug use no other tobacco   family history includes Emphysema in her mother; Heart attack in her brother; Heart block in her father; Lung cancer in her mother; Thyroid disease in her daughter, granddaughter, and mother.   Review of Systems As above  Objective:   Physical Exam BP (!) 170/100 (BP Location: Left Arm, Patient Position: Sitting, Cuff Size: Normal)   Pulse 80   Temp 98.2 F (36.8 C)   Ht 5' 4.5" (1.638 m) Comment: height measured without shoes  Wt 108 lb 4 oz (49.1 kg)   BMI 18.29 kg/m  Thin, anxious, talkative Anicteric abd soft, hyperesthetic

## 2019-12-28 ENCOUNTER — Telehealth: Payer: Self-pay

## 2019-12-28 NOTE — Telephone Encounter (Signed)
Left patient a message on her voice mail to call me back tomorrow as we are closing soon and the phones are off.

## 2019-12-28 NOTE — Telephone Encounter (Signed)
-----   Message from Iva Boop, MD sent at 12/28/2019 12:40 PM EDT ----- Regarding: pain management Please call and explain that we tried to set her up with a clinic ([facility]) but not taking new patients.  As I have little to no experience with these type of clinics I do not know where else to turn and in addition I have discovered that she violated her pain contract in last clinic so that will make it more difficult to gain access to a clinic.  Her last pain clinic MD has recommended that she seek help from and addiction and pain medication center.  I am not going to be able to help further with this - I simply do not know where else to turn.  She may need to ask her PCP or she can look herself for these clinics and ask if they will take her.  I am happy to send a letter to one she chooses.

## 2019-12-29 NOTE — Telephone Encounter (Signed)
I have informed patient and she will investigate and call us back if she finds a pain clinic for [place] to send a letter to.

## 2020-01-12 ENCOUNTER — Encounter: Admit: 2020-01-12 | Discharge: 2020-01-12 | Payer: MEDICARE | Attending: Anesthesiology | Primary: Anesthesiology

## 2020-01-12 ENCOUNTER — Ambulatory Visit: Admit: 2020-01-12 | Discharge: 2020-01-12 | Payer: MEDICARE

## 2020-01-12 MED ORDER — OXYCODONE 10 MG TABLET
ORAL_TABLET | Freq: Four times a day (QID) | ORAL | 0 refills | 2.00000 days | Status: CP | PRN
Start: 2020-01-12 — End: ?

## 2020-01-13 ENCOUNTER — Emergency Department (HOSPITAL_BASED_OUTPATIENT_CLINIC_OR_DEPARTMENT_OTHER): Payer: Medicare Other

## 2020-01-13 ENCOUNTER — Other Ambulatory Visit: Payer: Self-pay

## 2020-01-13 ENCOUNTER — Encounter (HOSPITAL_BASED_OUTPATIENT_CLINIC_OR_DEPARTMENT_OTHER): Payer: Self-pay | Admitting: *Deleted

## 2020-01-13 ENCOUNTER — Inpatient Hospital Stay (HOSPITAL_BASED_OUTPATIENT_CLINIC_OR_DEPARTMENT_OTHER)
Admission: EM | Admit: 2020-01-13 | Discharge: 2020-02-10 | DRG: 326 | Disposition: A | Discharge status: Hospice | Payer: Medicare Other | Attending: Internal Medicine | Admitting: Internal Medicine

## 2020-01-13 DIAGNOSIS — K920 Hematemesis: Secondary | ICD-10-CM

## 2020-01-13 DIAGNOSIS — Z681 Body mass index (BMI) 19 or less, adult: Secondary | ICD-10-CM

## 2020-01-13 DIAGNOSIS — R6521 Severe sepsis with septic shock: Secondary | ICD-10-CM | POA: Diagnosis present

## 2020-01-13 DIAGNOSIS — Z515 Encounter for palliative care: Secondary | ICD-10-CM | POA: Diagnosis not present

## 2020-01-13 DIAGNOSIS — F1123 Opioid dependence with withdrawal: Secondary | ICD-10-CM | POA: Diagnosis present

## 2020-01-13 DIAGNOSIS — K860 Alcohol-induced chronic pancreatitis: Secondary | ICD-10-CM | POA: Diagnosis present

## 2020-01-13 DIAGNOSIS — J189 Pneumonia, unspecified organism: Secondary | ICD-10-CM

## 2020-01-13 DIAGNOSIS — Z781 Physical restraint status: Secondary | ICD-10-CM

## 2020-01-13 DIAGNOSIS — J690 Pneumonitis due to inhalation of food and vomit: Secondary | ICD-10-CM | POA: Diagnosis not present

## 2020-01-13 DIAGNOSIS — K8521 Alcohol induced acute pancreatitis with uninfected necrosis: Secondary | ICD-10-CM | POA: Diagnosis not present

## 2020-01-13 DIAGNOSIS — E119 Type 2 diabetes mellitus without complications: Secondary | ICD-10-CM | POA: Diagnosis present

## 2020-01-13 DIAGNOSIS — K625 Hemorrhage of anus and rectum: Secondary | ICD-10-CM | POA: Diagnosis not present

## 2020-01-13 DIAGNOSIS — Z9889 Other specified postprocedural states: Secondary | ICD-10-CM | POA: Diagnosis not present

## 2020-01-13 DIAGNOSIS — R0603 Acute respiratory distress: Secondary | ICD-10-CM | POA: Diagnosis not present

## 2020-01-13 DIAGNOSIS — Y95 Nosocomial condition: Secondary | ICD-10-CM | POA: Diagnosis not present

## 2020-01-13 DIAGNOSIS — Z66 Do not resuscitate: Secondary | ICD-10-CM | POA: Diagnosis present

## 2020-01-13 DIAGNOSIS — R609 Edema, unspecified: Secondary | ICD-10-CM | POA: Diagnosis not present

## 2020-01-13 DIAGNOSIS — K922 Gastrointestinal hemorrhage, unspecified: Secondary | ICD-10-CM | POA: Diagnosis not present

## 2020-01-13 DIAGNOSIS — Z8349 Family history of other endocrine, nutritional and metabolic diseases: Secondary | ICD-10-CM

## 2020-01-13 DIAGNOSIS — Z8249 Family history of ischemic heart disease and other diseases of the circulatory system: Secondary | ICD-10-CM

## 2020-01-13 DIAGNOSIS — Z7189 Other specified counseling: Secondary | ICD-10-CM | POA: Diagnosis not present

## 2020-01-13 DIAGNOSIS — E878 Other disorders of electrolyte and fluid balance, not elsewhere classified: Secondary | ICD-10-CM | POA: Diagnosis not present

## 2020-01-13 DIAGNOSIS — E43 Unspecified severe protein-calorie malnutrition: Secondary | ICD-10-CM | POA: Diagnosis present

## 2020-01-13 DIAGNOSIS — J9601 Acute respiratory failure with hypoxia: Secondary | ICD-10-CM | POA: Diagnosis present

## 2020-01-13 DIAGNOSIS — K861 Other chronic pancreatitis: Secondary | ICD-10-CM | POA: Diagnosis not present

## 2020-01-13 DIAGNOSIS — E039 Hypothyroidism, unspecified: Secondary | ICD-10-CM | POA: Diagnosis not present

## 2020-01-13 DIAGNOSIS — N390 Urinary tract infection, site not specified: Secondary | ICD-10-CM | POA: Diagnosis present

## 2020-01-13 DIAGNOSIS — I959 Hypotension, unspecified: Secondary | ICD-10-CM | POA: Diagnosis present

## 2020-01-13 DIAGNOSIS — D696 Thrombocytopenia, unspecified: Secondary | ICD-10-CM | POA: Diagnosis not present

## 2020-01-13 DIAGNOSIS — Z825 Family history of asthma and other chronic lower respiratory diseases: Secondary | ICD-10-CM

## 2020-01-13 DIAGNOSIS — E871 Hypo-osmolality and hyponatremia: Secondary | ICD-10-CM | POA: Diagnosis not present

## 2020-01-13 DIAGNOSIS — N170 Acute kidney failure with tubular necrosis: Secondary | ICD-10-CM | POA: Diagnosis present

## 2020-01-13 DIAGNOSIS — E875 Hyperkalemia: Secondary | ICD-10-CM | POA: Diagnosis not present

## 2020-01-13 DIAGNOSIS — A419 Sepsis, unspecified organism: Secondary | ICD-10-CM | POA: Diagnosis not present

## 2020-01-13 DIAGNOSIS — K859 Acute pancreatitis without necrosis or infection, unspecified: Secondary | ICD-10-CM | POA: Diagnosis not present

## 2020-01-13 DIAGNOSIS — G894 Chronic pain syndrome: Secondary | ICD-10-CM | POA: Diagnosis present

## 2020-01-13 DIAGNOSIS — D649 Anemia, unspecified: Secondary | ICD-10-CM

## 2020-01-13 DIAGNOSIS — D638 Anemia in other chronic diseases classified elsewhere: Secondary | ICD-10-CM | POA: Diagnosis present

## 2020-01-13 DIAGNOSIS — J9602 Acute respiratory failure with hypercapnia: Secondary | ICD-10-CM | POA: Diagnosis present

## 2020-01-13 DIAGNOSIS — J449 Chronic obstructive pulmonary disease, unspecified: Secondary | ICD-10-CM | POA: Diagnosis present

## 2020-01-13 DIAGNOSIS — Q780 Osteogenesis imperfecta: Secondary | ICD-10-CM | POA: Diagnosis not present

## 2020-01-13 DIAGNOSIS — E785 Hyperlipidemia, unspecified: Secondary | ICD-10-CM | POA: Diagnosis present

## 2020-01-13 DIAGNOSIS — E8770 Fluid overload, unspecified: Secondary | ICD-10-CM | POA: Diagnosis not present

## 2020-01-13 DIAGNOSIS — J431 Panlobular emphysema: Secondary | ICD-10-CM | POA: Diagnosis not present

## 2020-01-13 DIAGNOSIS — R578 Other shock: Secondary | ICD-10-CM | POA: Diagnosis present

## 2020-01-13 DIAGNOSIS — K259 Gastric ulcer, unspecified as acute or chronic, without hemorrhage or perforation: Secondary | ICD-10-CM | POA: Diagnosis not present

## 2020-01-13 DIAGNOSIS — J90 Pleural effusion, not elsewhere classified: Secondary | ICD-10-CM | POA: Diagnosis not present

## 2020-01-13 DIAGNOSIS — K7200 Acute and subacute hepatic failure without coma: Secondary | ICD-10-CM | POA: Diagnosis not present

## 2020-01-13 DIAGNOSIS — N179 Acute kidney failure, unspecified: Secondary | ICD-10-CM | POA: Diagnosis not present

## 2020-01-13 DIAGNOSIS — I10 Essential (primary) hypertension: Secondary | ICD-10-CM | POA: Diagnosis not present

## 2020-01-13 DIAGNOSIS — I729 Aneurysm of unspecified site: Secondary | ICD-10-CM | POA: Diagnosis not present

## 2020-01-13 DIAGNOSIS — Z0189 Encounter for other specified special examinations: Secondary | ICD-10-CM

## 2020-01-13 DIAGNOSIS — I495 Sick sinus syndrome: Secondary | ICD-10-CM | POA: Diagnosis present

## 2020-01-13 DIAGNOSIS — E874 Mixed disorder of acid-base balance: Secondary | ICD-10-CM | POA: Diagnosis not present

## 2020-01-13 DIAGNOSIS — R0600 Dyspnea, unspecified: Secondary | ICD-10-CM

## 2020-01-13 DIAGNOSIS — R4182 Altered mental status, unspecified: Secondary | ICD-10-CM | POA: Diagnosis not present

## 2020-01-13 DIAGNOSIS — E861 Hypovolemia: Secondary | ICD-10-CM | POA: Diagnosis not present

## 2020-01-13 DIAGNOSIS — Z888 Allergy status to other drugs, medicaments and biological substances status: Secondary | ICD-10-CM

## 2020-01-13 DIAGNOSIS — E870 Hyperosmolality and hypernatremia: Secondary | ICD-10-CM | POA: Diagnosis not present

## 2020-01-13 DIAGNOSIS — R159 Full incontinence of feces: Secondary | ICD-10-CM | POA: Diagnosis not present

## 2020-01-13 DIAGNOSIS — B182 Chronic viral hepatitis C: Secondary | ICD-10-CM | POA: Diagnosis present

## 2020-01-13 DIAGNOSIS — J9600 Acute respiratory failure, unspecified whether with hypoxia or hypercapnia: Secondary | ICD-10-CM | POA: Diagnosis not present

## 2020-01-13 DIAGNOSIS — G9341 Metabolic encephalopathy: Secondary | ICD-10-CM | POA: Diagnosis not present

## 2020-01-13 DIAGNOSIS — Z978 Presence of other specified devices: Secondary | ICD-10-CM

## 2020-01-13 DIAGNOSIS — B1920 Unspecified viral hepatitis C without hepatic coma: Secondary | ICD-10-CM | POA: Diagnosis present

## 2020-01-13 DIAGNOSIS — Z01818 Encounter for other preprocedural examination: Secondary | ICD-10-CM

## 2020-01-13 DIAGNOSIS — G9340 Encephalopathy, unspecified: Secondary | ICD-10-CM | POA: Diagnosis not present

## 2020-01-13 DIAGNOSIS — K449 Diaphragmatic hernia without obstruction or gangrene: Secondary | ICD-10-CM | POA: Diagnosis present

## 2020-01-13 DIAGNOSIS — K8500 Idiopathic acute pancreatitis without necrosis or infection: Secondary | ICD-10-CM | POA: Diagnosis not present

## 2020-01-13 DIAGNOSIS — K319 Disease of stomach and duodenum, unspecified: Secondary | ICD-10-CM | POA: Diagnosis present

## 2020-01-13 DIAGNOSIS — Z79899 Other long term (current) drug therapy: Secondary | ICD-10-CM

## 2020-01-13 DIAGNOSIS — H9191 Unspecified hearing loss, right ear: Secondary | ICD-10-CM | POA: Diagnosis present

## 2020-01-13 DIAGNOSIS — Z79890 Hormone replacement therapy (postmenopausal): Secondary | ICD-10-CM

## 2020-01-13 DIAGNOSIS — L02211 Cutaneous abscess of abdominal wall: Secondary | ICD-10-CM | POA: Diagnosis not present

## 2020-01-13 DIAGNOSIS — R54 Age-related physical debility: Secondary | ICD-10-CM

## 2020-01-13 DIAGNOSIS — I2510 Atherosclerotic heart disease of native coronary artery without angina pectoris: Secondary | ICD-10-CM | POA: Diagnosis present

## 2020-01-13 DIAGNOSIS — R0902 Hypoxemia: Secondary | ICD-10-CM

## 2020-01-13 DIAGNOSIS — K921 Melena: Secondary | ICD-10-CM | POA: Diagnosis not present

## 2020-01-13 DIAGNOSIS — K852 Alcohol induced acute pancreatitis without necrosis or infection: Secondary | ICD-10-CM | POA: Diagnosis not present

## 2020-01-13 DIAGNOSIS — F17210 Nicotine dependence, cigarettes, uncomplicated: Secondary | ICD-10-CM | POA: Diagnosis present

## 2020-01-13 DIAGNOSIS — F1020 Alcohol dependence, uncomplicated: Secondary | ICD-10-CM | POA: Diagnosis present

## 2020-01-13 DIAGNOSIS — I728 Aneurysm of other specified arteries: Secondary | ICD-10-CM | POA: Diagnosis present

## 2020-01-13 DIAGNOSIS — D62 Acute posthemorrhagic anemia: Secondary | ICD-10-CM | POA: Diagnosis present

## 2020-01-13 DIAGNOSIS — Z20822 Contact with and (suspected) exposure to covid-19: Secondary | ICD-10-CM | POA: Diagnosis present

## 2020-01-13 DIAGNOSIS — E7800 Pure hypercholesterolemia, unspecified: Secondary | ICD-10-CM | POA: Diagnosis present

## 2020-01-13 DIAGNOSIS — Z950 Presence of cardiac pacemaker: Secondary | ICD-10-CM

## 2020-01-13 DIAGNOSIS — Z7951 Long term (current) use of inhaled steroids: Secondary | ICD-10-CM

## 2020-01-13 DIAGNOSIS — Z886 Allergy status to analgesic agent status: Secondary | ICD-10-CM

## 2020-01-13 DIAGNOSIS — K651 Peritoneal abscess: Secondary | ICD-10-CM | POA: Diagnosis not present

## 2020-01-13 DIAGNOSIS — K8681 Exocrine pancreatic insufficiency: Secondary | ICD-10-CM | POA: Diagnosis present

## 2020-01-13 DIAGNOSIS — B952 Enterococcus as the cause of diseases classified elsewhere: Secondary | ICD-10-CM | POA: Diagnosis present

## 2020-01-13 DIAGNOSIS — K228 Other specified diseases of esophagus: Secondary | ICD-10-CM | POA: Diagnosis not present

## 2020-01-13 DIAGNOSIS — E876 Hypokalemia: Secondary | ICD-10-CM | POA: Diagnosis not present

## 2020-01-13 LAB — URINALYSIS, ROUTINE W REFLEX MICROSCOPIC
Bilirubin Urine: NEGATIVE
Glucose, UA: NEGATIVE mg/dL
Ketones, ur: NEGATIVE mg/dL
Nitrite: NEGATIVE
Protein, ur: NEGATIVE mg/dL
Specific Gravity, Urine: 1.015 (ref 1.005–1.030)
pH: 5.5 (ref 5.0–8.0)

## 2020-01-13 LAB — CBC WITH DIFFERENTIAL/PLATELET
Abs Immature Granulocytes: 0.12 10*3/uL — ABNORMAL HIGH (ref 0.00–0.07)
Basophils Absolute: 0.1 10*3/uL (ref 0.0–0.1)
Basophils Relative: 0 %
Eosinophils Absolute: 0.1 10*3/uL (ref 0.0–0.5)
Eosinophils Relative: 1 %
HCT: 26.3 % — ABNORMAL LOW (ref 36.0–46.0)
Hemoglobin: 8.4 g/dL — ABNORMAL LOW (ref 12.0–15.0)
Immature Granulocytes: 1 %
Lymphocytes Relative: 14 %
Lymphs Abs: 2.5 10*3/uL (ref 0.7–4.0)
MCH: 30.1 pg (ref 26.0–34.0)
MCHC: 31.9 g/dL (ref 30.0–36.0)
MCV: 94.3 fL (ref 80.0–100.0)
Monocytes Absolute: 1.5 10*3/uL — ABNORMAL HIGH (ref 0.1–1.0)
Monocytes Relative: 8 %
Neutro Abs: 13.6 10*3/uL — ABNORMAL HIGH (ref 1.7–7.7)
Neutrophils Relative %: 76 %
Platelets: 293 10*3/uL (ref 150–400)
RBC: 2.79 MIL/uL — ABNORMAL LOW (ref 3.87–5.11)
RDW: 13.8 % (ref 11.5–15.5)
WBC: 17.9 10*3/uL — ABNORMAL HIGH (ref 4.0–10.5)
nRBC: 0 % (ref 0.0–0.2)

## 2020-01-13 LAB — COMPREHENSIVE METABOLIC PANEL
ALT: 10 U/L (ref 0–44)
AST: 21 U/L (ref 15–41)
Albumin: 2.8 g/dL — ABNORMAL LOW (ref 3.5–5.0)
Alkaline Phosphatase: 39 U/L (ref 38–126)
Anion gap: 11 (ref 5–15)
BUN: 32 mg/dL — ABNORMAL HIGH (ref 8–23)
CO2: 21 mmol/L — ABNORMAL LOW (ref 22–32)
Calcium: 8.3 mg/dL — ABNORMAL LOW (ref 8.9–10.3)
Chloride: 105 mmol/L (ref 98–111)
Creatinine, Ser: 1.68 mg/dL — ABNORMAL HIGH (ref 0.44–1.00)
GFR calc Af Amer: 36 mL/min — ABNORMAL LOW (ref >60)
GFR calc non Af Amer: 31 mL/min — ABNORMAL LOW (ref >60)
Glucose, Bld: 112 mg/dL — ABNORMAL HIGH (ref 70–99)
Potassium: 4.3 mmol/L (ref 3.5–5.1)
Sodium: 137 mmol/L (ref 135–145)
Total Bilirubin: 0.4 mg/dL (ref 0.3–1.2)
Total Protein: 5.4 g/dL — ABNORMAL LOW (ref 6.5–8.1)

## 2020-01-13 LAB — URINALYSIS, MICROSCOPIC (REFLEX): WBC, UA: >50 WBC/hpf (ref 0–5)

## 2020-01-13 LAB — LIPASE, BLOOD: Lipase: 256 U/L — ABNORMAL HIGH (ref 11–51)

## 2020-01-13 MED ORDER — IOHEXOL 300 MG/ML  SOLN
100.0000 mL | Freq: Once | INTRAMUSCULAR | Status: AC | PRN
  Administered 2020-01-13: 80 mL via INTRAVENOUS

## 2020-01-13 MED ORDER — PIPERACILLIN-TAZOBACTAM 3.375 G IVPB 30 MIN
3.3750 g | Freq: Once | INTRAVENOUS | Status: AC
  Administered 2020-01-13: 3.375 g via INTRAVENOUS
  Filled 2020-01-13 (×2): qty 50

## 2020-01-13 MED ORDER — PANTOPRAZOLE SODIUM 40 MG IV SOLR
INTRAVENOUS | Status: AC
  Filled 2020-01-13: qty 40

## 2020-01-13 MED ORDER — ONDANSETRON HCL 4 MG/2ML IJ SOLN
4.0000 mg | Freq: Once | INTRAMUSCULAR | Status: AC
  Administered 2020-01-13: 4 mg via INTRAVENOUS
  Filled 2020-01-13: qty 2

## 2020-01-13 MED ORDER — SODIUM CHLORIDE 0.9 % IV SOLN
8.0000 mg/h | INTRAVENOUS | Status: DC
  Administered 2020-01-13 – 2020-01-15 (×4): 8 mg/h via INTRAVENOUS
  Filled 2020-01-13 (×5): qty 80

## 2020-01-13 MED ORDER — METOCLOPRAMIDE HCL 5 MG/ML IJ SOLN
5.0000 mg | Freq: Once | INTRAMUSCULAR | Status: AC
  Administered 2020-01-13: 5 mg via INTRAVENOUS
  Filled 2020-01-13: qty 2

## 2020-01-13 MED ORDER — HYDROMORPHONE HCL 1 MG/ML IJ SOLN
1.0000 mg | Freq: Once | INTRAMUSCULAR | Status: AC
  Administered 2020-01-14: 1 mg via INTRAVENOUS

## 2020-01-13 MED ORDER — PANTOPRAZOLE SODIUM 40 MG IV SOLR
INTRAVENOUS | Status: AC
  Filled 2020-01-13: qty 80

## 2020-01-13 MED ORDER — HYDROMORPHONE HCL 1 MG/ML IJ SOLN
1.0000 mg | Freq: Once | INTRAMUSCULAR | Status: AC
  Administered 2020-01-13: 1 mg via INTRAVENOUS
  Filled 2020-01-13: qty 1

## 2020-01-13 MED ORDER — MORPHINE SULFATE (PF) 4 MG/ML IV SOLN
4.0000 mg | Freq: Once | INTRAVENOUS | Status: AC
  Administered 2020-01-13: 4 mg via INTRAVENOUS
  Filled 2020-01-13: qty 1

## 2020-01-13 MED ORDER — SODIUM CHLORIDE 0.9 % IV SOLN
80.0000 mg | Freq: Once | INTRAVENOUS | Status: AC
  Administered 2020-01-13: 80 mg via INTRAVENOUS
  Filled 2020-01-13: qty 80

## 2020-01-13 MED ORDER — SODIUM CHLORIDE 0.9 % IV BOLUS
500.0000 mL | Freq: Once | INTRAVENOUS | Status: AC
  Administered 2020-01-13: 500 mL via INTRAVENOUS

## 2020-01-13 MED ORDER — SODIUM CHLORIDE 0.9 % IV SOLN: Freq: Once | INTRAVENOUS | Status: AC

## 2020-01-13 NOTE — ED Notes (Signed)
In room to medicate patient for nausea and pain; patient requesting to give the medication time to give her relief before and further testing or lab collection. No active vomiting noted.

## 2020-01-13 NOTE — ED Notes (Signed)
2 unsuccessful IV attempts. 1 left forearm and 1 left AC

## 2020-01-13 NOTE — ED Provider Notes (Signed)
Emergency Department Provider Note   I have reviewed the triage vital signs and the nursing notes.   HISTORY  Chief Complaint Abdominal Pain   HPI Natasha Jacobson is a 69 y.o. 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 Lysle Rubens presents today with worsening epigastric abdominal pain and one episode of moderate volume bright red bloody emesis.  Patient continues to have epigastric abdominal pain with nausea but no additional vomiting.  She is not having diarrhea or lower abdominal pain.  No radiation of symptoms or other modifying factors.  After surgery she was given oxycodone 10 mg tabs and has been taking these with no lasting relief in symptoms.  She has not experienced fever, chills.  She denies chest pain.  She notes some occasional SOB with standing only.    Past Medical History:  Diagnosis Date  . 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 Alcohol-induced chronic pancreatitis ([place])   . Brittle bone disease   . Chronic pain syndrome   . COPD (chronic obstructive pulmonary disease) ([place])   . Hearing loss, right 07/08/2016  . Hepatitis C    Eradicated with antiviral treatment per patient  . High cholesterol   . Hypertension   . Hypopotassemia   . Hypothyroidism 07/08/2016  . Prediabetes   . SSS (sick sinus syndrome) ([place]) 09/06/2015  . Thyroid disease   . Vitamin D deficiency     Patient Active Problem List   Diagnosis Date Noted  . Pancreatitis, acute 01/13/2020  . Generalized anxiety disorder 12/20/2019  . Alcohol-induced chronic pancreatitis ([place]) 11/17/2019  . 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 Chronic pain syndrome 08/02/2018  . Chronic hepatitis C without hepatic coma ([place]) 08/18/2017  . Hypothyroidism 07/08/2016  . Hearing loss, right 07/08/2016  . COPD (chronic obstructive pulmonary disease) ([place]) 09/06/2015  . SSS (sick sinus syndrome)  ([place]) 09/06/2015    Past Surgical History:  Procedure Laterality Date  . ABDOMINAL HYSTERECTOMY    . APPENDECTOMY    . ERCP     multiple with stents placed   . PACEMAKER IMPLANT    . TONSILLECTOMY      Allergies Buprenorphine and Aspirin  Family History  Problem Relation Age of Onset  . Lung cancer Mother   . Emphysema Mother   . Thyroid disease Mother   . Heart block Father   . Heart attack Brother   . Thyroid disease Daughter   . Thyroid disease Granddaughter     Social History Social History   Tobacco Use  . Smoking status: Current Every Day Smoker    Types: Cigarettes  . Smokeless tobacco: Never Used  Substance Use Topics  . Alcohol use: Not Currently  . Drug use: Not Currently    Review of Systems  Constitutional: No fever/chills Eyes: No visual changes. ENT: No sore throat. Cardiovascular: Denies chest pain. Respiratory: Positive shortness of breath with standing.  Gastrointestinal: Positive epigastric abdominal pain. Positive nausea, no vomiting.  No diarrhea.  No constipation. One episode of hematemesis.  Genitourinary: Negative for dysuria. Musculoskeletal: Negative for back pain. Skin: Negative for rash. Neurological: Negative for headaches, focal weakness or numbness.  10-point ROS otherwise negative.  ____________________________________________   PHYSICAL EXAM:  VITAL SIGNS: ED Triage Vitals  Enc Vitals Group     BP 01/13/20 1604 103/63     Pulse Rate 01/13/20 1604 83     Resp 01/13/20 1604 16  Temp 01/13/20 1604 97.9 F (36.6 C)     Temp Source 01/13/20 1604 Oral     SpO2 01/13/20 1552 98 %   Constitutional: Alert and oriented. Well appearing and in no acute distress. Eyes: Conjunctivae are normal. Head: Atraumatic. Nose: No congestion/rhinnorhea. Mouth/Throat: Mucous membranes are moist.  Neck: No stridor. Cardiovascular: Normal rate, regular rhythm. Good peripheral circulation. Grossly normal heart sounds.   Respiratory:  Normal respiratory effort.  No retractions. Lungs CTAB. Gastrointestinal: Soft mild epigastric abdominal tenderness without rebound or guarding.  No distention.  Musculoskeletal: No lower extremity tenderness nor edema. No gross deformities of extremities. Neurologic:  Normal speech and language. No gross focal neurologic deficits are appreciated.  Skin:  Skin is warm, dry and intact. No rash noted.   ____________________________________________   LABS (all labs ordered are listed, but only abnormal results are displayed)  Labs Reviewed  COMPREHENSIVE METABOLIC PANEL - Abnormal; Notable for the following components:      Result Value   CO2 21 (*)    Glucose, Bld 112 (*)    BUN 32 (*)    Creatinine, Ser 1.68 (*)    Calcium 8.3 (*)    Total Protein 5.4 (*)    Albumin 2.8 (*)    GFR calc non Af Amer 31 (*)    GFR calc Af Amer 36 (*)    All other components within normal limits  LIPASE, BLOOD - Abnormal; Notable for the following components:   Lipase 256 (*)    All other components within normal limits  CBC WITH DIFFERENTIAL/PLATELET - Abnormal; Notable for the following components:   WBC 17.9 (*)    RBC 2.79 (*)    Hemoglobin 8.4 (*)    HCT 26.3 (*)    Neutro Abs 13.6 (*)    Monocytes Absolute 1.5 (*)    Abs Immature Granulocytes 0.12 (*)    All other components within normal limits  URINALYSIS, ROUTINE W REFLEX MICROSCOPIC - Abnormal; Notable for the following components:   APPearance CLOUDY (*)    Hgb urine dipstick TRACE (*)    Leukocytes,Ua MODERATE (*)    All other components within normal limits  URINALYSIS, MICROSCOPIC (REFLEX) - Abnormal; Notable for the following components:   Bacteria, UA FEW (*)    All other components within normal limits  URINE CULTURE  SARS CORONAVIRUS 2 (TAT 6-24 HRS)  CULTURE, BLOOD (ROUTINE X 2)  CULTURE, BLOOD (ROUTINE X 2)   ____________________________________________  RADIOLOGY  DG Chest 2 View  Result Date: 01/13/2020 CLINICAL  DATA:  Shortness of breath EXAM: CHEST - 2 VIEW COMPARISON:  06/16/2019 FINDINGS: Old granulomatous disease. Left pacer remains in place, unchanged. No confluent opacities or effusions. Heart is normal size. Aortic atherosclerosis. No acute bony abnormality. IMPRESSION: No acute cardiopulmonary disease. Old granulomatous disease. Electronically Signed   By: Charlett Nose M.D.   On: 01/13/2020 18:54   CT ABDOMEN PELVIS W CONTRAST  Result Date: 01/13/2020 CLINICAL DATA:  69 year old female with abdominal distention, nausea vomiting. History of chronic pancreatitis. Status post stent exchange. EXAM: CT ABDOMEN AND PELVIS WITH CONTRAST TECHNIQUE: Multidetector CT imaging of the abdomen and pelvis was performed using the standard protocol following bolus administration of intravenous contrast. CONTRAST:  41mL OMNIPAQUE IOHEXOL 300 MG/ML  SOLN COMPARISON:  CT abdomen pelvis dated 10/24/2019. FINDINGS: Lower chest: There is mild emphysematous changes of the visualized lung bases. Right lung base linear atelectasis/scarring. There is a small right middle lobe granuloma. Pacemaker wires noted. There is no  intra-abdominal free air or free fluid. Hepatobiliary: The liver is unremarkable. No intrahepatic biliary ductal dilatation. The gallbladder is unremarkable. Pancreas: There is diffuse coarse calcification of the pancreas with moderate atrophy of the distal gland sequela of chronic pancreatitis. Mild peripancreatic haziness noted. Correlation with pancreatic enzymes recommended to exclude recurrent acute pancreatitis. A pancreatic stent noted with pigtail ends in the second portion of the duodenum and in the body of the stomach. No peripancreatic fluid collection or pseudocyst. Spleen: Normal in size without focal abnormality. Adrenals/Urinary Tract: The adrenal glands are unremarkable. Which shaped areas of hypoenhancement in the kidneys bilaterally, left greater right concerning for areas of renal parenchyma infarct.  These are new since the prior CT. Correlation with urinalysis recommended to exclude pyelonephritis. There is a 1 cm right renal interpolar cyst. There is no hydronephrosis on either side. The visualized ureters and urinary bladder appear unremarkable. Stomach/Bowel: The stomach is distended with ingested content. There is moderate stool throughout the colon. There is no bowel obstruction or active inflammation. Appendectomy. Vascular/Lymphatic: There is advanced aortoiliac atherosclerotic disease. There is a 2.3 cm infrarenal aortic ectasia. The IVC is unremarkable. No portal venous gas. There is no adenopathy. Reproductive: Hysterectomy. No adnexal masses. Other: None Musculoskeletal: Degenerative changes primarily at L4-L5. No acute osseous pathology. IMPRESSION: 1. Mild peripancreatic haziness may represent acute on chronic pancreatitis. Correlation with pancreatic enzymes recommended. A pancreatic stent with pigtail ends in the second portion of the duodenum and in the body of the stomach noted. No peripancreatic fluid collection or pseudocyst. 2. Bilateral renal parenchyma wedge-shaped infarcts, new since the prior CT. Correlation with urinalysis recommended to exclude pyelonephritis. 3. No bowel obstruction. 4. Aortic Atherosclerosis (ICD10-I70.0). Electronically Signed   By: Elgie Collard M.D.   On: 01/13/2020 19:05    ____________________________________________   PROCEDURES  Procedure(s) performed:   Procedures  CRITICAL CARE Performed by: Maia Plan Total critical care time: 35 minutes Critical care time was exclusive of separately billable procedures and treating other patients. Critical care was necessary to treat or prevent imminent or life-threatening deterioration. Critical care was time spent personally by me on the following activities: development of treatment plan with patient and/or surrogate as well as nursing, discussions with consultants, evaluation of patient's  response to treatment, examination of patient, obtaining history from patient or surrogate, ordering and performing treatments and interventions, ordering and review of laboratory studies, ordering and review of radiographic studies, pulse oximetry and re-evaluation of patient's condition.  Alona Bene, MD Emergency Medicine  ____________________________________________   INITIAL IMPRESSION / ASSESSMENT AND PLAN / ED COURSE  Pertinent labs & imaging results that were available during my care of the patient were reviewed by me and considered in my medical decision making (see chart for details).   Patient presents to the emergency department for evaluation of abdominal pain with 1 episode of hematemesis in the setting of pancreatic stent exchange yesterday at [facility].  She is overall well-appearing with normal vital signs.  Abdominal exam with mild to moderate tenderness but no peritoneal findings.  Given her recent postoperative status plan for repeat CT imaging here along with labs, IV pain/nausea medication and reassess.   08:09 PM  Spoke to gastroenterology at [facility] Dr. Jacqulyn Bath.  Reviewed the case and lab abnormalities along with CT read.  Today would like to accept this patient but do not have any beds at this time and do not have an ETA as to when they would get those beds opened and cannot accept  her.  I asked that the patient's gastroenterologist be made aware of this likely post procedure complication and that they were unable to accept the patient.  I also suggested an ED to ED transfer to [facility] and spoke with the ED provider at [facility] who also states that they are at capacity and unable to accept the patient without an inpatient bed to go to.  Will discuss with our gastroenterologist regarding admission to the medicine service from [facility].   Spoke with Dr. Russella Dar with [facility] GI who reviewed the case with me by phone.  While not ideal, he will accept the patient to [facility] and requests hospitalist  admission.  I have started her Protonix infusion after the bolus was given here.  UA is equivocal and will follow cultures.  Covid testing sent.  Updated the patient regarding the planned admit to [facility].   Discussed patient's case with [facility], Dr. Mikeal Hawthorne to request admission. Patient and family (if present) updated with plan. Care transferred to [facility] service.  I reviewed all nursing notes, vitals, pertinent old records, EKGs, labs, imaging (as available).  ____________________________________________  FINAL CLINICAL IMPRESSION(S) / ED DIAGNOSES  Final diagnoses:  Acute pancreatitis without infection or necrosis, unspecified pancreatitis type  Hematemesis with nausea  Anemia, unspecified type  AKI (acute kidney injury) (HCC)     MEDICATIONS GIVEN DURING THIS VISIT:  Medications  pantoprazole (PROTONIX) 80 mg in sodium chloride 0.9 % 100 mL (0.8 mg/mL) infusion (8 mg/hr Intravenous New Bag/Given 01/13/20 2310)  pantoprazole (PROTONIX) 40 MG injection (has no administration in time range)  pantoprazole (PROTONIX) 40 MG injection (has no administration in time range)  pantoprazole (PROTONIX) 40 MG injection (has no administration in time range)  sodium chloride 0.9 % bolus 500 mL ( Intravenous Stopped 01/13/20 1818)  morphine 4 MG/ML injection 4 mg (4 mg Intravenous Given 01/13/20 1715)  ondansetron (ZOFRAN) injection 4 mg (4 mg Intravenous Given 01/13/20 1715)  iohexol (OMNIPAQUE) 300 MG/ML solution 100 mL (80 mLs Intravenous Contrast Given 01/13/20 1827)  HYDROmorphone (DILAUDID) injection 1 mg (1 mg Intravenous Given 01/13/20 1849)  HYDROmorphone (DILAUDID) injection 1 mg (1 mg Intravenous Given 01/13/20 2035)  metoCLOPramide (REGLAN) injection 5 mg (5 mg Intravenous Given 01/13/20 2030)  0.9 %  sodium chloride infusion ( Intravenous New Bag/Given 01/13/20 2030)  piperacillin-tazobactam (ZOSYN) IVPB 3.375 g (0 g Intravenous Stopped 01/13/20 2227)  pantoprazole (PROTONIX) 80 mg in sodium chloride 0.9 %  100 mL IVPB (0 mg Intravenous Stopped 01/13/20 2246)  HYDROmorphone (DILAUDID) injection 1 mg (1 mg Intravenous Given 01/13/20 2226)     Note:  This document was prepared using Dragon voice recognition software and may include unintentional dictation errors.  Alona Bene, MD, [facility] Emergency Medicine    Akai Dollard, Arlyss Repress, MD 01/13/20 [phone]

## 2020-01-13 NOTE — ED Notes (Signed)
Report to [facility] 

## 2020-01-13 NOTE — ED Notes (Signed)
1 unsuccessful IV attempt.

## 2020-01-13 NOTE — ED Triage Notes (Addendum)
Arrived ems c/o abd pain onset last pm w n/v   Had pantcreatic duct replacement yesterday per pt Started vomiting npure blood at noon today

## 2020-01-14 ENCOUNTER — Encounter (HOSPITAL_COMMUNITY): Payer: Self-pay | Admitting: Internal Medicine

## 2020-01-14 ENCOUNTER — Inpatient Hospital Stay (HOSPITAL_COMMUNITY): Payer: Medicare Other

## 2020-01-14 ENCOUNTER — Encounter (HOSPITAL_COMMUNITY)
Admission: EM | Disposition: A | Discharge status: Hospice | Payer: Self-pay | Source: Home / Self Care | Attending: Internal Medicine

## 2020-01-14 ENCOUNTER — Inpatient Hospital Stay (HOSPITAL_COMMUNITY)
Admission: EM | Disposition: A | Discharge status: Hospice | Payer: Self-pay | Source: Home / Self Care | Attending: Internal Medicine

## 2020-01-14 DIAGNOSIS — J9600 Acute respiratory failure, unspecified whether with hypoxia or hypercapnia: Secondary | ICD-10-CM

## 2020-01-14 DIAGNOSIS — K920 Hematemesis: Secondary | ICD-10-CM

## 2020-01-14 DIAGNOSIS — J9601 Acute respiratory failure with hypoxia: Secondary | ICD-10-CM

## 2020-01-14 DIAGNOSIS — K228 Other specified diseases of esophagus: Secondary | ICD-10-CM

## 2020-01-14 DIAGNOSIS — I10 Essential (primary) hypertension: Secondary | ICD-10-CM | POA: Diagnosis present

## 2020-01-14 DIAGNOSIS — K922 Gastrointestinal hemorrhage, unspecified: Secondary | ICD-10-CM | POA: Diagnosis not present

## 2020-01-14 DIAGNOSIS — K859 Acute pancreatitis without necrosis or infection, unspecified: Secondary | ICD-10-CM | POA: Diagnosis present

## 2020-01-14 DIAGNOSIS — K8521 Alcohol induced acute pancreatitis with uninfected necrosis: Secondary | ICD-10-CM | POA: Diagnosis not present

## 2020-01-14 HISTORY — PX: ESOPHAGOGASTRODUODENOSCOPY (EGD) WITH PROPOFOL: SHX5813

## 2020-01-14 LAB — URINALYSIS, ROUTINE W REFLEX MICROSCOPIC
Bilirubin Urine: NEGATIVE
Glucose, UA: >=500 mg/dL — AB
Ketones, ur: NEGATIVE mg/dL
Nitrite: NEGATIVE
Protein, ur: 100 mg/dL — AB
Specific Gravity, Urine: 1.012 (ref 1.005–1.030)
WBC, UA: >50 WBC/hpf — ABNORMAL HIGH (ref 0–5)
pH: 7 (ref 5.0–8.0)

## 2020-01-14 LAB — POCT I-STAT 7, (LYTES, BLD GAS, ICA,H+H)
Acid-base deficit: 23 mmol/L — ABNORMAL HIGH (ref 0.0–2.0)
Acid-base deficit: 7 mmol/L — ABNORMAL HIGH (ref 0.0–2.0)
Bicarbonate: 8.1 mmol/L — ABNORMAL LOW (ref 20.0–28.0)
Bicarbonate: 17 mmol/L — ABNORMAL LOW (ref 20.0–28.0)
Calcium, Ion: 0.98 mmol/L — ABNORMAL LOW (ref 1.15–1.40)
Calcium, Ion: 0.92 mmol/L — ABNORMAL LOW (ref 1.15–1.40)
HCT: 28 % — ABNORMAL LOW (ref 36.0–46.0)
HCT: 36 % (ref 36.0–46.0)
Hemoglobin: 9.5 g/dL — ABNORMAL LOW (ref 12.0–15.0)
Hemoglobin: 12.2 g/dL (ref 12.0–15.0)
O2 Saturation: 100 %
O2 Saturation: 95 %
Patient temperature: 97.6
Patient temperature: 31.6
Potassium: 4.9 mmol/L (ref 3.5–5.1)
Potassium: 3.3 mmol/L — ABNORMAL LOW (ref 3.5–5.1)
Sodium: 141 mmol/L (ref 135–145)
Sodium: 144 mmol/L (ref 135–145)
TCO2: 9 mmol/L — ABNORMAL LOW (ref 22–32)
TCO2: 18 mmol/L — ABNORMAL LOW (ref 22–32)
pCO2 arterial: 38.8 mmHg (ref 32.0–48.0)
pCO2 arterial: 24 mmHg — ABNORMAL LOW (ref 32.0–48.0)
pH, Arterial: 6.923 — CL (ref 7.350–7.450)
pH, Arterial: 7.433 (ref 7.350–7.450)
pO2, Arterial: 543 mmHg — ABNORMAL HIGH (ref 83.0–108.0)
pO2, Arterial: 54 mmHg — ABNORMAL LOW (ref 83.0–108.0)

## 2020-01-14 LAB — CBC WITH DIFFERENTIAL/PLATELET
Abs Immature Granulocytes: 2.15 10*3/uL — ABNORMAL HIGH (ref 0.00–0.07)
Basophils Absolute: 0 10*3/uL (ref 0.0–0.1)
Basophils Relative: 0 %
Eosinophils Absolute: 0.1 10*3/uL (ref 0.0–0.5)
Eosinophils Relative: 0 %
HCT: 13.6 % — ABNORMAL LOW (ref 36.0–46.0)
Hemoglobin: 3.9 g/dL — CL (ref 12.0–15.0)
Immature Granulocytes: 6 %
Lymphocytes Relative: 10 %
Lymphs Abs: 3.7 10*3/uL (ref 0.7–4.0)
MCH: 32 pg (ref 26.0–34.0)
MCHC: 28.7 g/dL — ABNORMAL LOW (ref 30.0–36.0)
MCV: 111.5 fL — ABNORMAL HIGH (ref 80.0–100.0)
Monocytes Absolute: 2.6 10*3/uL — ABNORMAL HIGH (ref 0.1–1.0)
Monocytes Relative: 7 %
Neutro Abs: 29.4 10*3/uL — ABNORMAL HIGH (ref 1.7–7.7)
Neutrophils Relative %: 77 %
Platelets: 189 10*3/uL (ref 150–400)
RBC: 1.22 MIL/uL — ABNORMAL LOW (ref 3.87–5.11)
RDW: 14.2 % (ref 11.5–15.5)
WBC: 38 10*3/uL — ABNORMAL HIGH (ref 4.0–10.5)
nRBC: 0 % (ref 0.0–0.2)

## 2020-01-14 LAB — GLUCOSE, CAPILLARY
Glucose-Capillary: 109 mg/dL — ABNORMAL HIGH (ref 70–99)
Glucose-Capillary: 37 mg/dL — CL (ref 70–99)
Glucose-Capillary: 204 mg/dL — ABNORMAL HIGH (ref 70–99)
Glucose-Capillary: 238 mg/dL — ABNORMAL HIGH (ref 70–99)
Glucose-Capillary: 279 mg/dL — ABNORMAL HIGH (ref 70–99)

## 2020-01-14 LAB — TRIGLYCERIDES: Triglycerides: 106 mg/dL (ref <150)

## 2020-01-14 LAB — HEPATIC FUNCTION PANEL
ALT: 124 U/L — ABNORMAL HIGH (ref 0–44)
AST: 214 U/L — ABNORMAL HIGH (ref 15–41)
Albumin: 1.6 g/dL — ABNORMAL LOW (ref 3.5–5.0)
Alkaline Phosphatase: 50 U/L (ref 38–126)
Bilirubin, Direct: <0.1 mg/dL (ref 0.0–0.2)
Total Bilirubin: 0.3 mg/dL (ref 0.3–1.2)
Total Protein: 3.1 g/dL — ABNORMAL LOW (ref 6.5–8.1)

## 2020-01-14 LAB — BASIC METABOLIC PANEL
BUN: 37 mg/dL — ABNORMAL HIGH (ref 8–23)
CO2: <7 mmol/L — ABNORMAL LOW (ref 22–32)
Calcium: 7.7 mg/dL — ABNORMAL LOW (ref 8.9–10.3)
Chloride: 113 mmol/L — ABNORMAL HIGH (ref 98–111)
Creatinine, Ser: 2.38 mg/dL — ABNORMAL HIGH (ref 0.44–1.00)
GFR calc Af Amer: 24 mL/min — ABNORMAL LOW (ref >60)
GFR calc non Af Amer: 21 mL/min — ABNORMAL LOW (ref >60)
Glucose, Bld: 56 mg/dL — ABNORMAL LOW (ref 70–99)
Potassium: 4.8 mmol/L (ref 3.5–5.1)
Sodium: 145 mmol/L (ref 135–145)

## 2020-01-14 LAB — MAGNESIUM: Magnesium: 1.7 mg/dL (ref 1.7–2.4)

## 2020-01-14 LAB — PREPARE RBC (CROSSMATCH)

## 2020-01-14 LAB — MRSA PCR SCREENING: MRSA by PCR: NEGATIVE

## 2020-01-14 LAB — PROTIME-INR
INR: 3.5 — ABNORMAL HIGH (ref 0.8–1.2)
Prothrombin Time: 34.9 seconds — ABNORMAL HIGH (ref 11.4–15.2)

## 2020-01-14 LAB — ABO/RH: ABO/RH(D): A NEG

## 2020-01-14 LAB — HEMOGLOBIN AND HEMATOCRIT, BLOOD
HCT: 37.9 % (ref 36.0–46.0)
Hemoglobin: 13.2 g/dL (ref 12.0–15.0)

## 2020-01-14 LAB — HIV ANTIBODY (ROUTINE TESTING W REFLEX): HIV Screen 4th Generation wRfx: NONREACTIVE

## 2020-01-14 LAB — SARS CORONAVIRUS 2 (TAT 6-24 HRS): SARS Coronavirus 2: NEGATIVE

## 2020-01-14 SURGERY — ESOPHAGOGASTRODUODENOSCOPY (EGD) WITH PROPOFOL
Anesthesia: Moderate Sedation

## 2020-01-14 SURGERY — ESOPHAGOGASTRODUODENOSCOPY (EGD) WITH PROPOFOL
Anesthesia: Monitor Anesthesia Care

## 2020-01-14 MED ORDER — IOHEXOL 350 MG/ML SOLN
80.0000 mL | Freq: Once | INTRAVENOUS | Status: AC | PRN
  Administered 2020-01-14: 16:00:00 80 mL via INTRAVENOUS

## 2020-01-14 MED ORDER — SODIUM CHLORIDE 0.9% IV SOLUTION: Freq: Once | INTRAVENOUS | Status: DC

## 2020-01-14 MED ORDER — LIDOCAINE HCL 1 % IJ SOLN
INTRAMUSCULAR | Status: AC
  Filled 2020-01-14: qty 20

## 2020-01-14 MED ORDER — INSULIN ASPART 100 UNIT/ML ~~LOC~~ SOLN
0.0000 [IU] | SUBCUTANEOUS | Status: DC
  Administered 2020-01-14: 3 [IU] via SUBCUTANEOUS
  Administered 2020-01-14: 2 [IU] via SUBCUTANEOUS
  Administered 2020-01-15: 3 [IU] via SUBCUTANEOUS
  Administered 2020-01-15 – 2020-01-24 (×5): 1 [IU] via SUBCUTANEOUS
  Administered 2020-01-24 – 2020-01-26 (×8): 2 [IU] via SUBCUTANEOUS
  Administered 2020-01-26: 1 [IU] via SUBCUTANEOUS
  Administered 2020-01-26: 2 [IU] via SUBCUTANEOUS
  Administered 2020-01-26 – 2020-02-01 (×11): 1 [IU] via SUBCUTANEOUS
  Administered 2020-02-02 (×2): 2 [IU] via SUBCUTANEOUS
  Administered 2020-02-02 – 2020-02-05 (×3): 1 [IU] via SUBCUTANEOUS

## 2020-01-14 MED ORDER — NOREPINEPHRINE 4 MG/250ML-% IV SOLN
0.0000 ug/min | INTRAVENOUS | Status: DC
  Administered 2020-01-14: 5 ug/min via INTRAVENOUS

## 2020-01-14 MED ORDER — LABETALOL HCL 5 MG/ML IV SOLN
INTRAVENOUS | Status: AC
  Filled 2020-01-14: qty 4

## 2020-01-14 MED ORDER — ONDANSETRON HCL 4 MG PO TABS: 4.0000 mg | ORAL_TABLET | Freq: Four times a day (QID) | ORAL | Status: DC | PRN

## 2020-01-14 MED ORDER — DIPHENHYDRAMINE HCL 50 MG/ML IJ SOLN
INTRAMUSCULAR | Status: AC
  Filled 2020-01-14: qty 1

## 2020-01-14 MED ORDER — FENTANYL CITRATE (PF) 100 MCG/2ML IJ SOLN
INTRAMUSCULAR | Status: AC
  Filled 2020-01-14: qty 4

## 2020-01-14 MED ORDER — VITAMIN K1 10 MG/ML IJ SOLN
10.0000 mg | Freq: Once | INTRAVENOUS | Status: AC
  Administered 2020-01-14: 17:00:00 10 mg via INTRAVENOUS
  Filled 2020-01-14: qty 1

## 2020-01-14 MED ORDER — HYDROMORPHONE HCL 1 MG/ML IJ SOLN: 1.0000 mg | Freq: Once | INTRAMUSCULAR | Status: DC

## 2020-01-14 MED ORDER — ONDANSETRON HCL 4 MG/2ML IJ SOLN
4.0000 mg | Freq: Four times a day (QID) | INTRAMUSCULAR | Status: DC | PRN
  Filled 2020-01-14: qty 2

## 2020-01-14 MED ORDER — ORAL CARE MOUTH RINSE
15.0000 mL | OROMUCOSAL | Status: DC
  Administered 2020-01-14 – 2020-01-15 (×14): 15 mL via OROMUCOSAL

## 2020-01-14 MED ORDER — SODIUM CHLORIDE 0.9 % IV SOLN
2.0000 g | INTRAVENOUS | Status: DC
  Administered 2020-01-14 – 2020-01-15 (×2): 2 g via INTRAVENOUS
  Filled 2020-01-14 (×3): qty 20

## 2020-01-14 MED ORDER — MIDAZOLAM HCL 2 MG/2ML IJ SOLN
2.0000 mg | Freq: Once | INTRAMUSCULAR | Status: AC
  Administered 2020-01-14: 10:00:00 2 mg via INTRAVENOUS

## 2020-01-14 MED ORDER — SODIUM CHLORIDE 0.9 % IV SOLN: INTRAVENOUS | Status: DC | PRN

## 2020-01-14 MED ORDER — HYDROMORPHONE HCL 1 MG/ML IJ SOLN
0.5000 mg | INTRAMUSCULAR | Status: DC | PRN
  Administered 2020-01-14: 0.5 mg via INTRAVENOUS
  Filled 2020-01-14: qty 0.5

## 2020-01-14 MED ORDER — HYDROMORPHONE HCL 1 MG/ML IJ SOLN
1.0000 mg | INTRAMUSCULAR | Status: DC | PRN
  Administered 2020-01-14 (×3): 1 mg via INTRAVENOUS
  Filled 2020-01-14 (×4): qty 1

## 2020-01-14 MED ORDER — CHLORHEXIDINE GLUCONATE CLOTH 2 % EX PADS
6.0000 | MEDICATED_PAD | Freq: Every day | CUTANEOUS | Status: DC
  Administered 2020-01-14 – 2020-02-09 (×24): 6 via TOPICAL

## 2020-01-14 MED ORDER — ETOMIDATE 2 MG/ML IV SOLN
20.0000 mg | Freq: Once | INTRAVENOUS | Status: AC
  Administered 2020-01-14: 10:00:00 20 mg via INTRAVENOUS

## 2020-01-14 MED ORDER — LABETALOL HCL 5 MG/ML IV SOLN
INTRAVENOUS | Status: AC | PRN
  Administered 2020-01-14: 10 mg via INTRAVENOUS

## 2020-01-14 MED ORDER — SODIUM CHLORIDE 0.9 % IV SOLN
1.0000 g | Freq: Once | INTRAVENOUS | Status: AC
  Administered 2020-01-14: 15:00:00 1 g via INTRAVENOUS
  Filled 2020-01-14: qty 10

## 2020-01-14 MED ORDER — ALBUTEROL SULFATE (2.5 MG/3ML) 0.083% IN NEBU
2.5000 mg | INHALATION_SOLUTION | RESPIRATORY_TRACT | Status: DC | PRN
  Administered 2020-01-14 – 2020-01-16 (×2): 2.5 mg via RESPIRATORY_TRACT
  Filled 2020-01-14 (×2): qty 3

## 2020-01-14 MED ORDER — CHLORHEXIDINE GLUCONATE 0.12% ORAL RINSE (MEDLINE KIT)
15.0000 mL | Freq: Two times a day (BID) | OROMUCOSAL | Status: DC
  Administered 2020-01-14 – 2020-01-15 (×3): 15 mL via OROMUCOSAL

## 2020-01-14 MED ORDER — DEXTROSE 50 % IV SOLN: 50.0000 mL | Freq: Once | INTRAVENOUS | Status: DC

## 2020-01-14 MED ORDER — SODIUM BICARBONATE 8.4 % IV SOLN
INTRAVENOUS | Status: AC
  Filled 2020-01-14: qty 200

## 2020-01-14 MED ORDER — HYDRALAZINE HCL 20 MG/ML IJ SOLN
10.0000 mg | INTRAMUSCULAR | Status: DC | PRN
  Administered 2020-01-20: 10 mg via INTRAVENOUS
  Filled 2020-01-14: qty 1

## 2020-01-14 MED ORDER — IOHEXOL 300 MG/ML  SOLN
150.0000 mL | Freq: Once | INTRAMUSCULAR | Status: AC | PRN
  Administered 2020-01-14: 17:00:00 35 mL via INTRA_ARTERIAL

## 2020-01-14 MED ORDER — ROCURONIUM BROMIDE 50 MG/5ML IV SOLN
50.0000 mg | Freq: Once | INTRAVENOUS | Status: AC
  Administered 2020-01-14: 10:00:00 50 mg via INTRAVENOUS
  Filled 2020-01-14: qty 5

## 2020-01-14 MED ORDER — DEXTROSE 10 % IV SOLN: INTRAVENOUS | Status: DC

## 2020-01-14 MED ORDER — LEVOTHYROXINE SODIUM 100 MCG/5ML IV SOLN
25.0000 ug | Freq: Every day | INTRAVENOUS | Status: DC
  Administered 2020-01-15 – 2020-01-16 (×2): 25 ug via INTRAVENOUS
  Filled 2020-01-14 (×5): qty 5

## 2020-01-14 MED ORDER — DEXTROSE 50 % IV SOLN
INTRAVENOUS | Status: AC
  Administered 2020-01-14: 11:00:00 50 mL
  Filled 2020-01-14: qty 50

## 2020-01-14 MED ORDER — SODIUM BICARBONATE 8.4 % IV SOLN
INTRAVENOUS | Status: AC
  Filled 2020-01-14: qty 100

## 2020-01-14 MED ORDER — FENTANYL CITRATE (PF) 100 MCG/2ML IJ SOLN
INTRAMUSCULAR | Status: AC | PRN
  Administered 2020-01-14: 100 ug via INTRAVENOUS

## 2020-01-14 MED ORDER — FENTANYL CITRATE (PF) 100 MCG/2ML IJ SOLN
INTRAMUSCULAR | Status: AC
  Filled 2020-01-14: qty 2

## 2020-01-14 MED ORDER — LABETALOL HCL 5 MG/ML IV SOLN
20.0000 mg | Freq: Once | INTRAVENOUS | Status: AC
  Administered 2020-01-14: 10 mg via INTRAVENOUS

## 2020-01-14 MED ORDER — HYDROMORPHONE HCL 1 MG/ML IJ SOLN
INTRAMUSCULAR | Status: AC
  Filled 2020-01-14: qty 1

## 2020-01-14 MED ORDER — NOREPINEPHRINE 4 MG/250ML-% IV SOLN
INTRAVENOUS | Status: AC
  Filled 2020-01-14: qty 250

## 2020-01-14 MED ORDER — NOREPINEPHRINE 16 MG/250ML-% IV SOLN
0.0000 ug/min | INTRAVENOUS | Status: DC
  Administered 2020-01-14: 15 ug/min via INTRAVENOUS
  Filled 2020-01-14: qty 250

## 2020-01-14 MED ORDER — SODIUM CHLORIDE 0.9 % IV SOLN
1.0000 g | Freq: Once | INTRAVENOUS | Status: AC
  Administered 2020-01-14: 1 g via INTRAVENOUS
  Filled 2020-01-14: qty 10

## 2020-01-14 MED ORDER — FENTANYL CITRATE (PF) 100 MCG/2ML IJ SOLN
50.0000 ug | INTRAMUSCULAR | Status: DC | PRN
  Administered 2020-01-14: 22:00:00 50 ug via INTRAVENOUS
  Filled 2020-01-14: qty 2

## 2020-01-14 MED ORDER — LIDOCAINE HCL 1 % IJ SOLN
INTRAMUSCULAR | Status: AC | PRN
  Administered 2020-01-14: 10 mL

## 2020-01-14 MED ORDER — SODIUM BICARBONATE 8.4 % IV SOLN
100.0000 meq | INTRAVENOUS | Status: AC
  Administered 2020-01-14: 12:00:00 100 meq via INTRAVENOUS

## 2020-01-14 MED ORDER — FENTANYL CITRATE (PF) 100 MCG/2ML IJ SOLN
100.0000 ug | Freq: Once | INTRAMUSCULAR | Status: AC
  Administered 2020-01-14: 10:00:00 100 ug via INTRAVENOUS

## 2020-01-14 MED ORDER — SODIUM BICARBONATE 8.4 % IV SOLN
200.0000 meq | Freq: Once | INTRAVENOUS | Status: AC
  Administered 2020-01-14: 14:00:00 200 meq via INTRAVENOUS

## 2020-01-14 MED ORDER — SODIUM BICARBONATE-DEXTROSE 150-5 MEQ/L-% IV SOLN
150.0000 meq | INTRAVENOUS | Status: DC
  Administered 2020-01-14 – 2020-01-15 (×3): 150 meq via INTRAVENOUS
  Filled 2020-01-14 (×6): qty 1000

## 2020-01-14 MED ORDER — LACTATED RINGERS IV SOLN: INTRAVENOUS | Status: DC

## 2020-01-14 MED ORDER — CHLORHEXIDINE GLUCONATE 0.12% ORAL RINSE (MEDLINE KIT): 15.0000 mL | Freq: Two times a day (BID) | OROMUCOSAL | Status: DC

## 2020-01-14 MED ORDER — ORAL CARE MOUTH RINSE: 15.0000 mL | OROMUCOSAL | Status: DC

## 2020-01-14 MED ORDER — MIDAZOLAM HCL 2 MG/2ML IJ SOLN
INTRAMUSCULAR | Status: AC
  Filled 2020-01-14: qty 2

## 2020-01-14 MED ORDER — TRANEXAMIC ACID-NACL 1000-0.7 MG/100ML-% IV SOLN
1000.0000 mg | Freq: Once | INTRAVENOUS | Status: AC
  Administered 2020-01-14: 13:00:00 1000 mg via INTRAVENOUS
  Filled 2020-01-14 (×2): qty 100

## 2020-01-14 MED ORDER — SODIUM CHLORIDE 0.9% IV SOLUTION: Freq: Once | INTRAVENOUS | Status: AC

## 2020-01-14 MED ORDER — MAGNESIUM SULFATE 2 GM/50ML IV SOLN
2.0000 g | Freq: Once | INTRAVENOUS | Status: AC
  Administered 2020-01-14: 23:00:00 2 g via INTRAVENOUS
  Filled 2020-01-14: qty 50

## 2020-01-14 MED ORDER — SODIUM CHLORIDE 0.9 % IV SOLN: Freq: Once | INTRAVENOUS | Status: AC

## 2020-01-14 MED ORDER — UMECLIDINIUM BROMIDE 62.5 MCG/INH IN AEPB
1.0000 | INHALATION_SPRAY | Freq: Every day | RESPIRATORY_TRACT | Status: DC
  Filled 2020-01-14: qty 7

## 2020-01-14 MED ORDER — PROPOFOL 1000 MG/100ML IV EMUL
5.0000 ug/kg/min | INTRAVENOUS | Status: DC
  Administered 2020-01-14: 10 ug/kg/min via INTRAVENOUS
  Administered 2020-01-15: 06:00:00 16 ug/kg/min via INTRAVENOUS
  Filled 2020-01-14 (×2): qty 100

## 2020-01-14 MED ORDER — MIDAZOLAM HCL (PF) 5 MG/ML IJ SOLN
INTRAMUSCULAR | Status: AC
  Filled 2020-01-14: qty 3

## 2020-01-14 SURGICAL SUPPLY — 15 items

## 2020-01-14 NOTE — H&P (View-Only) (Signed)
Consultation  Referring Provider:     Dr. Toniann Fail Primary Care Physician:  Ardyth Gal, MD Primary Gastroenterologist:        Leone Payor Reason for Consultation:     GIB, pancreatitis         HPI:   Natasha Jacobson is a 69 y.o. female with a history of chronic alcoholic pancreatitis.  She has had issues with chronic pain, seen by Dr. Leone Payor, on chronic narcotics.  She had a very complex EUS and ERCP on January 22 at [facility] with Dr. Jamse Mead.  She had a pancreatico gastrostomy performed with EUS, and 2 stents placed into her pancreas.  She has been having chronic pain and had a follow-up procedure with Dr. Jamse Mead at [facility] again on April 8.  She underwent dilation of the main pancreatic duct, had an occluded plastic stent removed from the pancreatic duct, another stent was removed from the pancreaticogastrostomy, and then dilated the PD with a balloon to 33mm and placed a plastic stent into the ventral pancreatic duct via the pancreaticogastrostomy.   She presented to the ER in [facility] yesterday with worsening abdominal pain, nausea vomiting and reported episodes of hematemesis.  CT scan performed there showed peripancreatic haziness with evidence of acute on chronic pancreatitis.  Pancreatic stent with pigtails was noted in the second portion of the duodenum and body of the stomach.  There was no evidence of peripancreatic fluid collection or pseudocyst.  She also had evidence of bilateral renal parenchyma wedge-shaped infarcts that appeared new.  Her previous baseline hemoglobin in February was 14.6.  Upon arrival to [facility] her hemoglobin was 8.41 with a white count of 17.9.  I had recommended that she be transferred to [facility] given the complexity of her case and recent pancreatic stenting per Dr. Jamse Mead.  The ED apparently called them twice, and she was denied setting no bed availability.  She was then transferred to [facility].  Upon walking in the room to evaluate her, she has multiple nurses at  her bedside after experiencing a significant hypotensive episode with multiple red bloody bowel movements per rectum.  She is responding to questions by nodding her head but is not verbalizing much.  There was a question of whether or not she had too much Dilaudid and consideration for Narcan but she was breathing spontaneously on her own.  She was given a bolus of fluid and her blood pressure improved from 50 systolic to low 100s.  They have not been able to get labs on her since her arrival to the hospital last night.      Past Medical History:  Diagnosis Date  . 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 Alcohol-induced chronic pancreatitis (HCC)   . Brittle bone disease   . Chronic pain syndrome   . COPD (chronic obstructive pulmonary disease) (HCC)   . Hearing loss, right 07/08/2016  . Hepatitis C    Eradicated with antiviral treatment per patient  . High cholesterol   . Hypertension   . Hypopotassemia   . Hypothyroidism 07/08/2016  . Prediabetes   . SSS (sick sinus syndrome) (HCC) 09/06/2015  . Thyroid disease   . Vitamin D deficiency     Past Surgical History:  Procedure Laterality Date  . ABDOMINAL HYSTERECTOMY    . APPENDECTOMY    . ERCP     multiple with stents placed   . PACEMAKER IMPLANT    . TONSILLECTOMY  Family History  Problem Relation Age of Onset  . Lung cancer Mother   . Emphysema Mother   . Thyroid disease Mother   . Heart block Father   . Heart attack Brother   . Thyroid disease Daughter   . Thyroid disease Granddaughter      Social History   Tobacco Use  . Smoking status: Current Every Day Smoker    Types: Cigarettes  . Smokeless tobacco: Never Used  Substance Use Topics  . Alcohol use: Not Currently  . Drug use: Not Currently    Prior to Admission medications   Medication Sig Start Date End Date Taking? Authorizing Provider  acetaminophen (TYLENOL) 500 MG tablet Take 2 tablets by mouth every  8 (eight) hours as needed. 11/01/19   [provider]  albuterol (VENTOLIN HFA) 108 (90 Base) MCG/ACT inhaler Inhale 2 puffs into the lungs every 4 (four) hours as needed. 11/24/18   [provider]  amLODipine (NORVASC) 5 MG tablet Take 1 tablet by mouth daily. 09/22/19   [provider]  baclofen (LIORESAL) 10 MG tablet Take 1 tablet by mouth daily as needed. 11/11/19   [provider]  ergocalciferol (VITAMIN D2) 1.25 MG (50000 UT) capsule Take 1 capsule by mouth once a week. 11/18/19   [provider]  fenofibrate (TRICOR) 48 MG tablet Take 1 tablet by mouth daily. 05/13/18   [provider]  gabapentin (NEURONTIN) 300 MG capsule Take 1 capsule by mouth 3 (three) times daily. 11/11/19 11/10/20  [provider]  levothyroxine (SYNTHROID) 50 MCG tablet Take 50 mcg by mouth daily before breakfast.    [provider]  Melatonin 5 MG CAPS Take 2 capsules by mouth at bedtime.    [provider]  Pancrelipase, Lip-Prot-Amyl, (CREON) 24000-76000 units CPEP Take 1 capsule by mouth 3 (three) times daily. 08/18/19   [provider]  pantoprazole (PROTONIX) 40 MG tablet Take 40 mg by mouth daily.    [provider]  polyethylene glycol powder (GLYCOLAX/MIRALAX) 17 GM/SCOOP powder Take 17 g by mouth as needed.    [provider]  senna (SENOKOT) 8.6 MG TABS tablet Take 1 tablet by mouth as needed for mild constipation.    [provider]  Tiotropium Bromide Monohydrate (SPIRIVA RESPIMAT) 1.25 MCG/ACT AERS Inhale 2 puffs into the lungs daily. 11/15/19 11/14/20  [provider]    Current Facility-Administered Medications  Medication Dose Route Frequency Provider Last Rate Last Admin  . 0.9 %  sodium chloride infusion (Manually program via Guardrails IV Fluids)   Intravenous Once Shahzaib Azevedo, Willaim Rayas, MD      . 0.9 %  sodium chloride infusion   Intravenous Once Osei-Bonsu, Greggory Stallion, MD      .  albuterol (PROVENTIL) (2.5 MG/3ML) 0.083% nebulizer solution 2.5 mg  2.5 mg Inhalation Q4H PRN Eduard Clos, MD      . cefTRIAXone (ROCEPHIN) 2 g in sodium chloride 0.9 % 100 mL IVPB  2 g Intravenous Q24H Eduard Clos, MD      . hydrALAZINE (APRESOLINE) injection 10 mg  10 mg Intravenous Q4H PRN Eduard Clos, MD      . HYDROmorphone (DILAUDID) 1 MG/ML injection           . HYDROmorphone (DILAUDID) injection 1 mg  1 mg Intravenous Q2H PRN Eduard Clos, MD   1 mg at 01/14/20 0709  . HYDROmorphone (DILAUDID) injection 1 mg  1 mg Intravenous Once Jackie Plum, MD      .  lactated ringers infusion   Intravenous Continuous Eduard Clos, MD 150 mL/hr at 01/14/20 0335 New Bag at 01/14/20 0335  . levothyroxine (SYNTHROID, LEVOTHROID) injection 25 mcg  25 mcg Intravenous Daily Eduard Clos, MD      . ondansetron [facility]) tablet 4 mg  4 mg Oral Q6H PRN Eduard Clos, MD       Or  . ondansetron [facility]) injection 4 mg  4 mg Intravenous Q6H PRN Eduard Clos, MD      . pantoprazole (PROTONIX) 40 MG injection           . pantoprazole (PROTONIX) 40 MG injection           . pantoprazole (PROTONIX) 40 MG injection           . pantoprazole (PROTONIX) 80 mg in sodium chloride 0.9 % 100 mL (0.8 mg/mL) infusion  8 mg/hr Intravenous Continuous Eduard Clos, MD 10 mL/hr at 01/13/20 2310 8 mg/hr at 01/13/20 2310  . umeclidinium bromide (INCRUSE ELLIPTA) 62.5 MCG/INH 1 puff  1 puff Inhalation Daily Eduard Clos, MD        Allergies as of 01/13/2020 - Review Complete 01/13/2020  Allergen Reaction Noted  . Buprenorphine Nausea Only 06/16/2019  . Aspirin Nausea Only 06/06/2016     Review of Systems:    As per HPI, otherwise negative    Physical Exam:  Vital signs in last 24 hours: Temp:  [97.6 F (36.4 C)-98 F (36.7 C)] 97.6 F (36.4 C) (04/10 0500) Pulse Rate:  [65-98] 65 (04/10 0751) Resp:  [14-20] 19 (04/10 0751) BP:  (92-124)/(55-97) 124/97 (04/10 0751) SpO2:  [90 %-100 %] 93 % (04/10 0751) Weight:  [50.7 kg] 50.7 kg (04/10 0108) Last BM Date: 01/13/20 General:   Female lying in bed, nodding yes or no not verbalizing Head:  Normocephalic and atraumatic. Eyes:   No icterus.   Conjunctiva pale Lungs:  Respirations even and unlabored.  Heart:  Regular rate and rhythm;  Abdomen:  Soft, epigastric TTP,  Extremities:  Without edema.   LAB RESULTS: Recent Labs    01/13/20 1705  WBC 17.9*  HGB 8.4*  HCT 26.3*  PLT 293   BMET Recent Labs    01/13/20 1705  NA 137  K 4.3  CL 105  CO2 21*  GLUCOSE 112*  BUN 32*  CREATININE 1.68*  CALCIUM 8.3*   LFT Recent Labs    01/13/20 1705  PROT 5.4*  ALBUMIN 2.8*  AST 21  ALT 10  ALKPHOS 39  BILITOT 0.4   PT/INR No results for input(s): LABPROT, INR in the last 72 hours.  STUDIES: DG Chest 2 View  Result Date: 01/13/2020 CLINICAL DATA:  Shortness of breath EXAM: CHEST - 2 VIEW COMPARISON:  06/16/2019 FINDINGS: Old granulomatous disease. Left pacer remains in place, unchanged. No confluent opacities or effusions. Heart is normal size. Aortic atherosclerosis. No acute bony abnormality. IMPRESSION: No acute cardiopulmonary disease. Old granulomatous disease. Electronically Signed   By: Charlett Nose M.D.   On: 01/13/2020 18:54   CT ABDOMEN PELVIS W CONTRAST  Result Date: 01/13/2020 CLINICAL DATA:  69 year old female with abdominal distention, nausea vomiting. History of chronic pancreatitis. Status post stent exchange. EXAM: CT ABDOMEN AND PELVIS WITH CONTRAST TECHNIQUE: Multidetector CT imaging of the abdomen and pelvis was performed using the standard protocol following bolus administration of intravenous contrast. CONTRAST:  54mL OMNIPAQUE IOHEXOL 300 MG/ML  SOLN COMPARISON:  CT abdomen pelvis dated 10/24/2019. FINDINGS: Lower chest: There  is mild emphysematous changes of the visualized lung bases. Right lung base linear atelectasis/scarring. There  is a small right middle lobe granuloma. Pacemaker wires noted. There is no intra-abdominal free air or free fluid. Hepatobiliary: The liver is unremarkable. No intrahepatic biliary ductal dilatation. The gallbladder is unremarkable. Pancreas: There is diffuse coarse calcification of the pancreas with moderate atrophy of the distal gland sequela of chronic pancreatitis. Mild peripancreatic haziness noted. Correlation with pancreatic enzymes recommended to exclude recurrent acute pancreatitis. A pancreatic stent noted with pigtail ends in the second portion of the duodenum and in the body of the stomach. No peripancreatic fluid collection or pseudocyst. Spleen: Normal in size without focal abnormality. Adrenals/Urinary Tract: The adrenal glands are unremarkable. Which shaped areas of hypoenhancement in the kidneys bilaterally, left greater right concerning for areas of renal parenchyma infarct. These are new since the prior CT. Correlation with urinalysis recommended to exclude pyelonephritis. There is a 1 cm right renal interpolar cyst. There is no hydronephrosis on either side. The visualized ureters and urinary bladder appear unremarkable. Stomach/Bowel: The stomach is distended with ingested content. There is moderate stool throughout the colon. There is no bowel obstruction or active inflammation. Appendectomy. Vascular/Lymphatic: There is advanced aortoiliac atherosclerotic disease. There is a 2.3 cm infrarenal aortic ectasia. The IVC is unremarkable. No portal venous gas. There is no adenopathy. Reproductive: Hysterectomy. No adnexal masses. Other: None Musculoskeletal: Degenerative changes primarily at L4-L5. No acute osseous pathology. IMPRESSION: 1. Mild peripancreatic haziness may represent acute on chronic pancreatitis. Correlation with pancreatic enzymes recommended. A pancreatic stent with pigtail ends in the second portion of the duodenum and in the body of the stomach noted. No peripancreatic fluid  collection or pseudocyst. 2. Bilateral renal parenchyma wedge-shaped infarcts, new since the prior CT. Correlation with urinalysis recommended to exclude pyelonephritis. 3. No bowel obstruction. 4. Aortic Atherosclerosis (ICD10-I70.0). Electronically Signed   By: Arash  Radparvar M.D.   On: 01/13/2020 19:05     Impression / Plan:   68-year-old female with a complicated history of chronic alcoholic pancreatitis with pancreatic strictures, she has undergone complex EUS guided pancreaticogastrostomy with Dr. Barron at [facility] for treatment of this.  Most recently on April 8 had a clogged pancreatic stent removed and was replaced after dilation of the pancreatic duct.  She appears to have developed acute on chronic pancreatitis from this, as well as has had a severe upper GI bleed.  It's possible she could be bleeding from her pancreas given the recent intervention she had, but we need to perform an urgent upper endoscopy to clarify where this is coming from and exclude other etiology.  Her CT scan at [place] did not show any active extravasation however upon my evaluation of her this morning her condition has markedly worsened since I last heard about her overnight.  They have not been able to get labs on her, her mental status is changed which may be due to multiple doses of Dilaudid but she is breathing on her own. She has been hypotensive and with ongoing hemorrhage from upper GI bleed.  Recommend: - I have consulted critical care urgently, the patient warrants transfer to the ICU for intubation to protect her airway, central line for access / labs / resuscitation - stat labs when able to draw them, PRBC transfusion 2 units of blood to start ASAP - she will need urgent bedside EGD in ICU once her airway is protected and resuscitated, likely in the next few hours.  I need to   clarify where her bleeding is coming from and ensure no other cause such as a Mallory-Weiss from her vomiting. If the bleeding is  coming from the pancreas, will need IR evaluation and possibly surgery - further recommendations pending EGD.  I will check back with her when she is been transferred to the ICU and more prepared for endoscopy. Please call me in the interim with any changes in her status.  Chappell Cellar, MD [facility]astroenterology

## 2020-01-14 NOTE — Progress Notes (Signed)
Called daughter and updated her on patient's grave condition.  Myrla Halsted MD PCCM

## 2020-01-14 NOTE — Consult Note (Signed)
NAME:  Natasha Jacobson, MRN:  [id number], DOB:  May 05, 1951, LOS: 1 ADMISSION DATE:  01/13/2020, CONSULTATION DATE:  01/14/20 REFERRING MD:  Adela Lank, CHIEF COMPLAINT:  AMS   Brief History   69 year old woman admitted 4/9 with reports of dark stools, hematemesis and epigastric pain after recent exchange of pancreatico gastrostomy stent at [facility] (4/8).  BRBPR with symptomatic anemia.  Tx to ICU 4/10 for EGD.   History of present illness   69 year old woman with hx of alcoholic pancreatitis s/p recent stenting at [facility] presenting with abdominal pain, hematemesis, and dark stool.  Hgb 8, baseline 14 g/dL based on prior records.   Unfortunately this AM started to have hematochezia found to be more altered and hypotensive requiring urgent transfer to ICU.  Past Medical History  . Abnormal Pap smear of cervix  . Abuse, drug or alcohol (CMS-HCC)  . Alcoholism (CMS-HCC)  . Arthritis  . COPD (chronic obstructive pulmonary disease) (CMS-HCC)  . Coronary artery disease  . Disease of thyroid gland  . GERD (gastroesophageal reflux disease)  . Hepatitis C 1972  . History of transfusion  . HPV (human papilloma virus) infection  . Hyperlipidemia  . Hypertension  . Infectious viral hepatitis 1972  . Myocardial infarction (CMS-HCC)  . Post-menopausal  . Shortness of breath  . SSS (sick sinus syndrome) (CMS-HCC)  . Succinylcholine adverse reaction    Significant Hospital Events   4/9 admitted 4/10 transfer to ICU  Consults:  GI  Procedures:  4/10 Intubation, central line, EGD  Significant Diagnostic Tests:  CT A/P 01/13/20 IMPRESSION: 1. Mild peripancreatic haziness may represent acute on chronic pancreatitis. Correlation with pancreatic enzymes recommended. A pancreatic stent with pigtail ends in the second portion of the duodenum and in the body of the stomach noted. No peripancreatic fluid collection or pseudocyst. 2. Bilateral renal parenchyma wedge-shaped infarcts, new since the prior CT.  Correlation with urinalysis recommended to exclude pyelonephritis. 3. No bowel obstruction. 4. Aortic Atherosclerosis (ICD10-I70.0).  Micro Data:  UA +, Urine culture>> 4/9 COVID Neg  Antimicrobials:  4/9 Zosyn x 1 4/10 Ceftriaxone >>   Interim history/subjective:  Consulted.  Objective   Blood pressure (!) 109/46, pulse 68, temperature 97.6 F (36.4 C), resp. rate 19, height 5\' 4"  (1.626 m), weight 50.7 kg, SpO2 (!) 81 %.        Intake/Output Summary (Last 24 hours) at 01/14/2020 0950 Last data filed at 01/13/2020 1851 Gross per 24 hour  Intake 501.13 ml  Output --  Net 501.13 ml   Filed Weights   01/14/20 0108  Weight: 50.7 kg    Examination: General: confused ill appearing woman writhing around in bed HENT: NRB in place, trachea midline Lungs: shallow inspiratory effort, + accessory muscle use Cardiovascular: RRR, ext lukewarm Abdomen: Soft, diffusely tender to palpation Extremities: no edema Neuro: moving all 4 ext but not to command Skin: ashen  Resolved Hospital Problem list   N/A  Assessment & Plan:  Acute GIB, presumed upper and related to recent instrumentation - PPI gtt - q6h H/H - stat 4/4/1 transfusion - may need to go to [facility] if bleeding from pancreas  Shock related to above - Levophed PRN  Worsening hypoxemic respiratory failure - Intubate, VAP prevention bundle - Goal pH > 7.3, goal PaO2 > 65  Alcoholic pancreatitis managed by [facility] at [facility], no beds were available for admission yesterday  Best practice:  Diet: NPO Pain/Anxiety/Delirium protocol (if indicated): PRNs VAP protocol (if indicated): ordered 4/10  DVT prophylaxis: SCDs GI prophylaxis: PPI gtt Glucose control: SSI Mobility: BR Code Status: Full Family Communication: will reach out Disposition: urgent ICU transfer, we will take over, appreciate TRH consult  Labs   CBC: Recent Labs  Lab 01/13/20 1705  WBC 17.9*  NEUTROABS 13.6*  HGB 8.4*  HCT 26.3*  MCV 94.3  PLT  293    Basic Metabolic Panel: Recent Labs  Lab 01/13/20 1705  NA 137  K 4.3  CL 105  CO2 21*  GLUCOSE 112*  BUN 32*  CREATININE 1.68*  CALCIUM 8.3*   GFR: Estimated Creatinine Clearance: 26.4 mL/min (A) (by C-G formula based on SCr of 1.68 mg/dL (H)). Recent Labs  Lab 01/13/20 1705  WBC 17.9*    Liver Function Tests: Recent Labs  Lab 01/13/20 1705  AST 21  ALT 10  ALKPHOS 39  BILITOT 0.4  PROT 5.4*  ALBUMIN 2.8*   Recent Labs  Lab 01/13/20 1705  LIPASE 256*   No results for input(s): AMMONIA in the last 168 hours.  ABG No results found for: PHART, PCO2ART, PO2ART, HCO3, TCO2, ACIDBASEDEF, O2SAT   Coagulation Profile: No results for input(s): INR, PROTIME in the last 168 hours.  Cardiac Enzymes: No results for input(s): CKTOTAL, CKMB, CKMBINDEX, TROPONINI in the last 168 hours.  HbA1C: No results found for: HGBA1C  CBG: Recent Labs  Lab 01/14/20 0612  GLUCAP 109*    Review of Systems:   Too encephalopathic to answer  Past Medical History  She,  has a past medical history of Adhesive capsulitis of left shoulder (06/06/2016), Alcohol-induced chronic pancreatitis (HCC), Brittle bone disease, Chronic pain syndrome, COPD (chronic obstructive pulmonary disease) (HCC), Hearing loss, right (07/08/2016), Hepatitis C, High cholesterol, Hypertension, Hypopotassemia, Hypothyroidism (07/08/2016), Prediabetes, SSS (sick sinus syndrome) (HCC) (09/06/2015), Thyroid disease, and Vitamin D deficiency.   Surgical History    Past Surgical History:  Procedure Laterality Date  . ABDOMINAL HYSTERECTOMY    . APPENDECTOMY    . ERCP     multiple with stents placed   . PACEMAKER IMPLANT    . TONSILLECTOMY       Social History   reports that she has been smoking cigarettes. She has never used smokeless tobacco. She reports previous alcohol use. She reports previous drug use.   Family History   Her family history includes Emphysema in her mother; Heart attack in her  brother; Heart block in her father; Lung cancer in her mother; Thyroid disease in her daughter, granddaughter, and mother.   Allergies Allergies  Allergen Reactions  . Buprenorphine Nausea Only  . Aspirin Nausea Only    GI upset GI upset      Home Medications  Prior to Admission medications   Medication Sig Start Date End Date Taking? Authorizing Provider  acetaminophen (TYLENOL) 500 MG tablet Take 2 tablets by mouth every 8 (eight) hours as needed. 11/01/19   [provider]  albuterol (VENTOLIN HFA) 108 (90 Base) MCG/ACT inhaler Inhale 2 puffs into the lungs every 4 (four) hours as needed. 11/24/18   [provider]  amLODipine (NORVASC) 5 MG tablet Take 1 tablet by mouth daily. 09/22/19   [provider]  baclofen (LIORESAL) 10 MG tablet Take 1 tablet by mouth daily as needed. 11/11/19   [provider]  ergocalciferol (VITAMIN D2) 1.25 MG (50000 UT) capsule Take 1 capsule by mouth once a week. 11/18/19   [provider]  fenofibrate (TRICOR) 48 MG tablet Take 1 tablet by mouth daily. 05/13/18  [provider]  gabapentin (NEURONTIN) 300 MG capsule Take 1 capsule by mouth 3 (three) times daily. 11/11/19 11/10/20  [provider]  levothyroxine (SYNTHROID) 50 MCG tablet Take 50 mcg by mouth daily before breakfast.    [provider]  Melatonin 5 MG CAPS Take 2 capsules by mouth at bedtime.    [provider]  Pancrelipase, Lip-Prot-Amyl, (CREON) 24000-76000 units CPEP Take 1 capsule by mouth 3 (three) times daily. 08/18/19   [provider]  pantoprazole (PROTONIX) 40 MG tablet Take 40 mg by mouth daily.    [provider]  polyethylene glycol powder (GLYCOLAX/MIRALAX) 17 GM/SCOOP powder Take 17 g by mouth as needed.    [provider]  senna (SENOKOT) 8.6 MG TABS tablet Take 1 tablet by mouth as needed for mild constipation.    [provider]  Tiotropium Bromide Monohydrate  (SPIRIVA RESPIMAT) 1.25 MCG/ACT AERS Inhale 2 puffs into the lungs daily. 11/15/19 11/14/20  [provider]     Critical care time: 45 minutes not including any separately billable procedures

## 2020-01-14 NOTE — Progress Notes (Signed)
Writer called to  Get pain med order from MD this am due to patient yelling "nobody is helping me I am in pain " obtained hydromorphone order,went down to room patient was almost unresponsive,pale,clammy,told me she could not breathe unable to obtain o2 sat,placed o2 on patient they came up to 72%,then 85% with rebreather mask,had to add additional o2 via high flow later got sats up to 100%. Her temp was hypothermic,her bp in the low 80's then down to low 50's SBP order was obtained for fluid bolus. I had called a rapid response on patient initially when I went to the room the team arrived and assisted me to stabilize patient for transport [facility] ICU. She was steadily bleeding from her rectum bright red blood with visible clots,Dr.Armbruster came in and seen her during rapid ordered for her go to ICU for possible emergent intubation and stated would probably have emergent EGD today. He gave writer stat orders for blood transfusion etc. Daughter was called about above aware of rapid response and rationale for ICU transfer. Dr.Osei-Bonsu also seen patient before transport to [facility] ICU new orders were noted from him as well. I have attempted call report to [facility] ICU nurse is tied up with patient at present left message and my phone number.

## 2020-01-14 NOTE — Significant Event (Signed)
Rapid Response Event Note  Overview: Time Called: 0826 Arrival Time: 0830 Event Type: Respiratory  Called for pt being hard to arouse, pale, clammy.  Initial Focused Assessment: Pt lying in bed, opens eyes to voice. Pt intermittently thrashing in bed. Pt is able to follow simple commands. Pt is pale, diaphoretic. Intermittently will state she is in pain. Pt noted to have lost IV access. Unable to get pulse oximetry reading. Extremities are cold to touch, likely pt is hypothermic. Pt rhythm appears to be atrial paced with a heart rate of 70-85 bpm. RR 20-24. Oxygen saturation 95% when reading on pt's forehead, pt on 100% NRB and 15L HFNC. Pt noted to have GIB. Blood per rectum is red with clots. Per primary RN pt has been having bloody BMs throughout the night into this morning.   Pt noted to have received 1mg  IV Dilaudid at 0002, 0206, 0327, 0512, 0709. Prior to the RN call rapid response, pain was complaining of pain requesting additional pain medication.   Interventions: -IV access established -1L IVF bolus -Warm blankets applied  Plan of Care (if not transferred): -PCCM consulted, pt transferred to ICU.   Event Summary: Name of Physician Notified: Dr. [id number] at   Outcome: Transferred (Comment)(Pt transferred to ICU) Event End Time: 0950  0951

## 2020-01-14 NOTE — Procedures (Signed)
  Procedure: Selective splenic arteriogram and coil embolization pseudoaneurysm EBL:   minimal Complications:  none immediate  See full dictation in [employer].  Thora Lance MD Main # [phone] Pager  [phone]

## 2020-01-14 NOTE — Progress Notes (Signed)
Patient transported to CT then IR & back to room 2M08 while on the vent with no problems.

## 2020-01-14 NOTE — Progress Notes (Signed)
Patient did require restraints be placed bilateral wrist and legs so emergent treatment could be given she was confused and interfering with treatment. She was transferred in her bed to 6M in the restraints so she would not pull her lines she is very confused at times on transfer.

## 2020-01-14 NOTE — Progress Notes (Addendum)
Late entry.  Admission H&P reviewed and noted.  Patient admitted to the floor this morning and had an episode of agitation with complaints of abdominal pain.  Subsequently noted to have rectal bleeding with hypotension and hypoxia.  Patient was in restraints, with systolics in the low 70s CCM consulted after fluid boluses and transferred to [facility] care.  Patient also discussed with GI consultant Dr. Adela Lank

## 2020-01-14 NOTE — Progress Notes (Signed)
Critical ABG results given to Dr Katrinka Blazing [facility]- 6.74 PCO2-38.5 POS-551 HCO3- 5.3

## 2020-01-14 NOTE — Consult Note (Addendum)
Consultation  Referring Provider:     Dr. Toniann Fail Primary Care Physician:  Ardyth Gal, MD Primary Gastroenterologist:        Leone Payor Reason for Consultation:     GIB, pancreatitis         HPI:   Natasha Jacobson is a 69 y.o. female with a history of chronic alcoholic pancreatitis.  She has had issues with chronic pain, seen by Dr. Leone Payor, on chronic narcotics.  She had a very complex EUS and ERCP on January 22 at [facility] with Dr. Jamse Mead.  She had a pancreatico gastrostomy performed with EUS, and 2 stents placed into her pancreas.  She has been having chronic pain and had a follow-up procedure with Dr. Jamse Mead at [facility] again on April 8.  She underwent dilation of the main pancreatic duct, had an occluded plastic stent removed from the pancreatic duct, another stent was removed from the pancreaticogastrostomy, and then dilated the PD with a balloon to 33mm and placed a plastic stent into the ventral pancreatic duct via the pancreaticogastrostomy.   She presented to the ER in [facility] yesterday with worsening abdominal pain, nausea vomiting and reported episodes of hematemesis.  CT scan performed there showed peripancreatic haziness with evidence of acute on chronic pancreatitis.  Pancreatic stent with pigtails was noted in the second portion of the duodenum and body of the stomach.  There was no evidence of peripancreatic fluid collection or pseudocyst.  She also had evidence of bilateral renal parenchyma wedge-shaped infarcts that appeared new.  Her previous baseline hemoglobin in February was 14.6.  Upon arrival to [facility] her hemoglobin was 8.41 with a white count of 17.9.  I had recommended that she be transferred to [facility] given the complexity of her case and recent pancreatic stenting per Dr. Jamse Mead.  The ED apparently called them twice, and she was denied setting no bed availability.  She was then transferred to [facility].  Upon walking in the room to evaluate her, she has multiple nurses at  her bedside after experiencing a significant hypotensive episode with multiple red bloody bowel movements per rectum.  She is responding to questions by nodding her head but is not verbalizing much.  There was a question of whether or not she had too much Dilaudid and consideration for Narcan but she was breathing spontaneously on her own.  She was given a bolus of fluid and her blood pressure improved from 50 systolic to low 100s.  They have not been able to get labs on her since her arrival to the hospital last night.      Past Medical History:  Diagnosis Date  . 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 Alcohol-induced chronic pancreatitis (HCC)   . Brittle bone disease   . Chronic pain syndrome   . COPD (chronic obstructive pulmonary disease) (HCC)   . Hearing loss, right 07/08/2016  . Hepatitis C    Eradicated with antiviral treatment per patient  . High cholesterol   . Hypertension   . Hypopotassemia   . Hypothyroidism 07/08/2016  . Prediabetes   . SSS (sick sinus syndrome) (HCC) 09/06/2015  . Thyroid disease   . Vitamin D deficiency     Past Surgical History:  Procedure Laterality Date  . ABDOMINAL HYSTERECTOMY    . APPENDECTOMY    . ERCP     multiple with stents placed   . PACEMAKER IMPLANT    . TONSILLECTOMY  Family History  Problem Relation Age of Onset  . Lung cancer Mother   . Emphysema Mother   . Thyroid disease Mother   . Heart block Father   . Heart attack Brother   . Thyroid disease Daughter   . Thyroid disease Granddaughter      Social History   Tobacco Use  . Smoking status: Current Every Day Smoker    Types: Cigarettes  . Smokeless tobacco: Never Used  Substance Use Topics  . Alcohol use: Not Currently  . Drug use: Not Currently    Prior to Admission medications   Medication Sig Start Date End Date Taking? Authorizing Provider  acetaminophen (TYLENOL) 500 MG tablet Take 2 tablets by mouth every  8 (eight) hours as needed. 11/01/19   [provider]  albuterol (VENTOLIN HFA) 108 (90 Base) MCG/ACT inhaler Inhale 2 puffs into the lungs every 4 (four) hours as needed. 11/24/18   [provider]  amLODipine (NORVASC) 5 MG tablet Take 1 tablet by mouth daily. 09/22/19   [provider]  baclofen (LIORESAL) 10 MG tablet Take 1 tablet by mouth daily as needed. 11/11/19   [provider]  ergocalciferol (VITAMIN D2) 1.25 MG (50000 UT) capsule Take 1 capsule by mouth once a week. 11/18/19   [provider]  fenofibrate (TRICOR) 48 MG tablet Take 1 tablet by mouth daily. 05/13/18   [provider]  gabapentin (NEURONTIN) 300 MG capsule Take 1 capsule by mouth 3 (three) times daily. 11/11/19 11/10/20  [provider]  levothyroxine (SYNTHROID) 50 MCG tablet Take 50 mcg by mouth daily before breakfast.    [provider]  Melatonin 5 MG CAPS Take 2 capsules by mouth at bedtime.    [provider]  Pancrelipase, Lip-Prot-Amyl, (CREON) 24000-76000 units CPEP Take 1 capsule by mouth 3 (three) times daily. 08/18/19   [provider]  pantoprazole (PROTONIX) 40 MG tablet Take 40 mg by mouth daily.    [provider]  polyethylene glycol powder (GLYCOLAX/MIRALAX) 17 GM/SCOOP powder Take 17 g by mouth as needed.    [provider]  senna (SENOKOT) 8.6 MG TABS tablet Take 1 tablet by mouth as needed for mild constipation.    [provider]  Tiotropium Bromide Monohydrate (SPIRIVA RESPIMAT) 1.25 MCG/ACT AERS Inhale 2 puffs into the lungs daily. 11/15/19 11/14/20  [provider]    Current Facility-Administered Medications  Medication Dose Route Frequency Provider Last Rate Last Admin  . 0.9 %  sodium chloride infusion (Manually program via Guardrails IV Fluids)   Intravenous Once Shiasia Porro, Willaim Rayas, MD      . 0.9 %  sodium chloride infusion   Intravenous Once Osei-Bonsu, Greggory Stallion, MD      .  albuterol (PROVENTIL) (2.5 MG/3ML) 0.083% nebulizer solution 2.5 mg  2.5 mg Inhalation Q4H PRN Eduard Clos, MD      . cefTRIAXone (ROCEPHIN) 2 g in sodium chloride 0.9 % 100 mL IVPB  2 g Intravenous Q24H Eduard Clos, MD      . hydrALAZINE (APRESOLINE) injection 10 mg  10 mg Intravenous Q4H PRN Eduard Clos, MD      . HYDROmorphone (DILAUDID) 1 MG/ML injection           . HYDROmorphone (DILAUDID) injection 1 mg  1 mg Intravenous Q2H PRN Eduard Clos, MD   1 mg at 01/14/20 0709  . HYDROmorphone (DILAUDID) injection 1 mg  1 mg Intravenous Once Jackie Plum, MD      .  lactated ringers infusion   Intravenous Continuous Eduard Clos, MD 150 mL/hr at 01/14/20 0335 New Bag at 01/14/20 0335  . levothyroxine (SYNTHROID, LEVOTHROID) injection 25 mcg  25 mcg Intravenous Daily Eduard Clos, MD      . ondansetron [facility]) tablet 4 mg  4 mg Oral Q6H PRN Eduard Clos, MD       Or  . ondansetron [facility]) injection 4 mg  4 mg Intravenous Q6H PRN Eduard Clos, MD      . pantoprazole (PROTONIX) 40 MG injection           . pantoprazole (PROTONIX) 40 MG injection           . pantoprazole (PROTONIX) 40 MG injection           . pantoprazole (PROTONIX) 80 mg in sodium chloride 0.9 % 100 mL (0.8 mg/mL) infusion  8 mg/hr Intravenous Continuous Eduard Clos, MD 10 mL/hr at 01/13/20 2310 8 mg/hr at 01/13/20 2310  . umeclidinium bromide (INCRUSE ELLIPTA) 62.5 MCG/INH 1 puff  1 puff Inhalation Daily Eduard Clos, MD        Allergies as of 01/13/2020 - Review Complete 01/13/2020  Allergen Reaction Noted  . Buprenorphine Nausea Only 06/16/2019  . Aspirin Nausea Only 06/06/2016     Review of Systems:    As per HPI, otherwise negative    Physical Exam:  Vital signs in last 24 hours: Temp:  [97.6 F (36.4 C)-98 F (36.7 C)] 97.6 F (36.4 C) (04/10 0500) Pulse Rate:  [65-98] 65 (04/10 0751) Resp:  [14-20] 19 (04/10 0751) BP:  (92-124)/(55-97) 124/97 (04/10 0751) SpO2:  [90 %-100 %] 93 % (04/10 0751) Weight:  [50.7 kg] 50.7 kg (04/10 0108) Last BM Date: 01/13/20 General:   Female lying in bed, nodding yes or no not verbalizing Head:  Normocephalic and atraumatic. Eyes:   No icterus.   Conjunctiva pale Lungs:  Respirations even and unlabored.  Heart:  Regular rate and rhythm;  Abdomen:  Soft, epigastric TTP,  Extremities:  Without edema.   LAB RESULTS: Recent Labs    01/13/20 1705  WBC 17.9*  HGB 8.4*  HCT 26.3*  PLT 293   BMET Recent Labs    01/13/20 1705  NA 137  K 4.3  CL 105  CO2 21*  GLUCOSE 112*  BUN 32*  CREATININE 1.68*  CALCIUM 8.3*   LFT Recent Labs    01/13/20 1705  PROT 5.4*  ALBUMIN 2.8*  AST 21  ALT 10  ALKPHOS 39  BILITOT 0.4   PT/INR No results for input(s): LABPROT, INR in the last 72 hours.  STUDIES: DG Chest 2 View  Result Date: 01/13/2020 CLINICAL DATA:  Shortness of breath EXAM: CHEST - 2 VIEW COMPARISON:  06/16/2019 FINDINGS: Old granulomatous disease. Left pacer remains in place, unchanged. No confluent opacities or effusions. Heart is normal size. Aortic atherosclerosis. No acute bony abnormality. IMPRESSION: No acute cardiopulmonary disease. Old granulomatous disease. Electronically Signed   By: Charlett Nose M.D.   On: 01/13/2020 18:54   CT ABDOMEN PELVIS W CONTRAST  Result Date: 01/13/2020 CLINICAL DATA:  69 year old female with abdominal distention, nausea vomiting. History of chronic pancreatitis. Status post stent exchange. EXAM: CT ABDOMEN AND PELVIS WITH CONTRAST TECHNIQUE: Multidetector CT imaging of the abdomen and pelvis was performed using the standard protocol following bolus administration of intravenous contrast. CONTRAST:  54mL OMNIPAQUE IOHEXOL 300 MG/ML  SOLN COMPARISON:  CT abdomen pelvis dated 10/24/2019. FINDINGS: Lower chest: There  is mild emphysematous changes of the visualized lung bases. Right lung base linear atelectasis/scarring. There  is a small right middle lobe granuloma. Pacemaker wires noted. There is no intra-abdominal free air or free fluid. Hepatobiliary: The liver is unremarkable. No intrahepatic biliary ductal dilatation. The gallbladder is unremarkable. Pancreas: There is diffuse coarse calcification of the pancreas with moderate atrophy of the distal gland sequela of chronic pancreatitis. Mild peripancreatic haziness noted. Correlation with pancreatic enzymes recommended to exclude recurrent acute pancreatitis. A pancreatic stent noted with pigtail ends in the second portion of the duodenum and in the body of the stomach. No peripancreatic fluid collection or pseudocyst. Spleen: Normal in size without focal abnormality. Adrenals/Urinary Tract: The adrenal glands are unremarkable. Which shaped areas of hypoenhancement in the kidneys bilaterally, left greater right concerning for areas of renal parenchyma infarct. These are new since the prior CT. Correlation with urinalysis recommended to exclude pyelonephritis. There is a 1 cm right renal interpolar cyst. There is no hydronephrosis on either side. The visualized ureters and urinary bladder appear unremarkable. Stomach/Bowel: The stomach is distended with ingested content. There is moderate stool throughout the colon. There is no bowel obstruction or active inflammation. Appendectomy. Vascular/Lymphatic: There is advanced aortoiliac atherosclerotic disease. There is a 2.3 cm infrarenal aortic ectasia. The IVC is unremarkable. No portal venous gas. There is no adenopathy. Reproductive: Hysterectomy. No adnexal masses. Other: None Musculoskeletal: Degenerative changes primarily at L4-L5. No acute osseous pathology. IMPRESSION: 1. Mild peripancreatic haziness may represent acute on chronic pancreatitis. Correlation with pancreatic enzymes recommended. A pancreatic stent with pigtail ends in the second portion of the duodenum and in the body of the stomach noted. No peripancreatic fluid  collection or pseudocyst. 2. Bilateral renal parenchyma wedge-shaped infarcts, new since the prior CT. Correlation with urinalysis recommended to exclude pyelonephritis. 3. No bowel obstruction. 4. Aortic Atherosclerosis (ICD10-I70.0). Electronically Signed   By: Elgie CollardArash  Radparvar M.D.   On: 01/13/2020 19:05     Impression / Plan:   69 year old female with a complicated history of chronic alcoholic pancreatitis with pancreatic strictures, she has undergone complex EUS guided pancreaticogastrost[facility].  Most recently on April 8 had a clogged pancreatic stent removed and was replaced after dilation of the pancreatic duct.  She appears to have developed acute on chronic pancreatitis from this, as well as has had a severe upper GI bleed.  It's possible she could be bleeding from her pancreas given the recent intervention she had, but we need to perform an urgent upper endoscopy to clarify where this is coming from and exclude other etiology.  [facility] did not show any active extravasation however upon my evaluation of her this morning her condition has markedly worsened since I last heard about her overnight.  They have not been able to get labs on her, her mental status is changed which may be due to multiple doses of Dilaudid but she is breathing on her own. She has been hypotensive and with ongoing hemorrhage from upper GI bleed.  Recommend: - I have consulted critical care urgently, the patient warrants transfer to the ICU for intubation to protect her airway, central line for access / labs / resuscitation - stat labs when able to draw them, PRBC transfusion 2 units of blood to start ASAP - she will need urgent bedside EGD in ICU once her airway is protected and resuscitated, likely in the next few hours.  I need to  clarify where her bleeding is coming from and ensure no other cause such as a Mallory-Weiss from her vomiting. If the bleeding is  coming from the pancreas, will need IR evaluation and possibly surgery - further recommendations pending EGD.  I will check back with her when she is been transferred to the ICU and more prepared for endoscopy. Please call me in the interim with any changes in her status.  Chappell Cellar, MD [facility]

## 2020-01-14 NOTE — Procedures (Signed)
Arterial Catheter Insertion Procedure Note Natasha Jacobson [id number] [birth year]  Procedure: Insertion of Arterial Catheter  Indications: Blood pressure monitoring  Procedure Details Consent: Unable to obtain consent because of emergent medical necessity. Time Out: Verified patient identification, verified procedure, site/side was marked, verified correct patient position, special equipment/implants available, medications/allergies/relevent history reviewed, required imaging and test results available.  Performed  Maximum sterile technique was used including antiseptics, cap, gloves, gown, hand hygiene, mask and sheet. Skin prep: Chlorhexidine; local anesthetic administered 20 gauge catheter was inserted into right radial artery using the Seldinger technique. ULTRASOUND GUIDANCE USED: YES Evaluation Blood flow good; BP tracing good. Complications: No apparent complications.

## 2020-01-14 NOTE — Interval H&P Note (Signed)
History and Physical Interval Note:  01/14/2020 1:02 PM  Natasha Jacobson  has presented today for surgery, with the diagnosis of GIB.  The various methods of treatment have been discussed with the patient and family. After consideration of risks, benefits and other options for treatment, the patient has consented to  Procedure(s): ESOPHAGOGASTRODUODENOSCOPY (EGD) WITH PROPOFOL (N/A) as a surgical intervention.  The patient's history has been reviewed, patient examined, no change in status, stable for surgery.  I have reviewed the patient's chart and labs.  Questions were answered to the patient's satisfaction.     Viviann Spare P Laasia Arcos

## 2020-01-14 NOTE — Op Note (Signed)
[facility] Patient Name: Natasha Jacobson Procedure Date : 01/14/2020 MRN: [id number] Attending MD: Willaim Rayas. 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 Adela Lank, MD, [place] Lodge RN, RN,                            Beryle Beams, Technician, Marc Morgans, Technician Referring MD:              Medicines:                Monitored Anesthesia Care Complications:            active GI bleeding unable to stop endoscopically Estimated Blood Loss:     ongoing active gi bleeding Procedure:                Pre-Anesthesia Assessment:                           - Prior to the procedure, a History and Physical                            was performed, and patient medications and                            allergies were reviewed. The patient's tolerance of                            previous anesthesia was also reviewed. The risks                            and benefits of the procedure and the sedation                            options and risks were discussed with the patient.                            All questions were answered, and informed consent  was obtained. Prior Anticoagulants: The patient has                            taken no previous anticoagulant or antiplatelet                            agents. ASA Grade Assessment: IV - A patient with                            severe systemic disease that is a constant threat                            to life. After  reviewing the risks and benefits,                            the patient was deemed in satisfactory condition to                            undergo the procedure.                           After obtaining informed consent, the endoscope was                            passed under direct vision. Throughout the                            procedure, the patient's blood pressure, pulse, and                            oxygen saturations were monitored continuously. The                            GIF-H190 ([id number]) Olympus gastroscope was                            introduced through the mouth, and advanced to the                            second part of duodenum. The upper GI endoscopy was                            technically difficult and complex due to excessive                            bleeding. The patient tolerated the procedure. Scope In: Scope Out: Findings:      Red blood was found in the entire esophagus. Time was taken to lavage       the esophagus but did not appreciate any active lesions / bleeding from       the esophagus.      The exam of the esophagus was otherwise normal.      A significant amount of red blood and large clot burden was found in the  entire examined stomach. I could not clearly see the       pancreaticogastrostomy due to the burden of blood. There was also       residual food fragments in the stomach which also prohibited       visualization. As the blood was suctioned from the stomach it filled up       the lumen just was quickly, could not get good visualization of any       portion of the stomach to clarify where the source of bleeding was, red       blood noted from the fundus down into the antrum. Multiple large clots      Red blood and clot was found in the duodenal bulb and in the second       portion of the duodenum. Again this prohibited visualization. There       appeared to be a large clot coming off the ampulla, I presume the        pancreatic stent was within it. No obvious luminal pathology bleeding in       the duodenum but visualization was again poor. Given the severity of       bleeding and poor visualization with this endoscopy, concern for       possible bleeding from the pancreas given findings although unable to       confirm due to poor visualization, the procedure was aborted as outlined. Impression:               - Red blood and clots filling the entire stomach                            and duodenum - clot appreciated at the ampulla,                            assuming this is overlying the stent,                            pancreaticogastrostomy unable to be visualized.                            Procedure terminated given active significant                            hemorrhage, poor visualization to clarify where the                            source is or be able to treat it.                           Bleeding from within the pancreas is possible given                            location of blood versus other pathology, although                            nothing otherwise noted on endoscopic procedure  report from [facility] on 4/8. Recommendation:           - Remain in the ICU for ongoing care.                           - NPO.                           - continue IV PPI                           - I have contacted Dr. Deanne Coffer of IR - plan on STAT                            CT angio with possible embolization pending findings                           - General surgery consult if bleeding persists and                            unable to stop per IR                           - Continue supportive care - blood products, trend                            Hgb                           - I have called the patient's daughter and updated                            her on the condition of the patient - she is                            critically ill with severe hemorrhage that may be                             difficult to control, have asked her to come to the                            hospital.                           - Call with questions, we will contiue to follow Procedure Code(s):        --- Professional ---                           [phone], Esophagogastroduodenoscopy, flexible,                            transoral; diagnostic, including collection of                            specimen(s) by brushing or washing, when performed                            (  separate procedure) Diagnosis Code(s):        --- Professional ---                           K22.8, Other specified diseases of esophagus                           K92.2, Gastrointestinal hemorrhage, unspecified CPT copyright 2019 [employer]. All rights reserved. The codes documented in this report are preliminary and upon coder review may  be revised to meet current compliance requirements. Viviann Spare P. Zaiyah Sottile, MD 01/14/2020 2:17:00 PM This report has been signed electronically. Number of Addenda: 0

## 2020-01-14 NOTE — Procedures (Signed)
Intubation Procedure Note Natasha Jacobson [id number] 1951/01/28  Procedure: Intubation Indications: Airway protection and maintenance  Procedure Details Consent: Unable to obtain consent because of altered level of consciousness. Time Out: Verified patient identification, verified procedure, site/side was marked, verified correct patient position, special equipment/implants available, medications/allergies/relevent history reviewed, required imaging and test results available.  Performed  Maximum sterile technique was used including gloves, gown, hand hygiene and mask.  MAC  videolarygnoscope number 4  Grade 1 view of VC  Sedated with versed, fentanyl, etomidate and rocuronium   Evaluation Hemodynamic Status: BP stable throughout; O2 sats: stable throughout Patient's Current Condition: stable Complications: No apparent complications Patient did tolerate procedure well. Chest X-ray ordered to verify placement.  CXR: pending.  Patient was intubated with direct assistance from myself and Luisa Hart, MD, PGY-1  Natasha Jacobson Natasha Jacobson 01/14/2020

## 2020-01-14 NOTE — Progress Notes (Signed)
PCCM Interval progress:  Pt rounded on, remains intubated and sedated with steady hematemesis.  Most recent Hgb 12.2.  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  ABG improved:  I-STAT 7, (LYTES, BLD GAS, ICA, H+H) [[id number]] (Abnormal)   Collected: 01/14/20 1944   Updated: 01/14/20 1948    pH, Arterial 7.433   pCO2 arterial 24.0Low  mmHg   pO2, Arterial 54.0Low  mmHg   Bicarbonate 17.0Low  mmol/L   TCO2 18Low  mmol/L   O2 Saturation 95.0 %   Acid-base deficit 7.0High  mmol/L   Sodium 144 mmol/L   Potassium 3.3Low  mmol/L   Calcium, Ion 0.92Low  mmol/L   HCT 36.0 %   Hemoglobin 12.2 g/dL   Patient temperature 45.9 C   Collection site RADIAL, ALLEN'S TEST ACCEPTABLE   Drawn by Operator   Sample type ARTERIAL     P: -follow serial h/h -family aware of patient's grave condition     Darcella Gasman Ankush Gintz, PA-C

## 2020-01-14 NOTE — Sedation Documentation (Signed)
Pt on vent 100% O2 resp 30 5 peep 200 tidal vol.

## 2020-01-14 NOTE — Progress Notes (Signed)
Critical ABG results given to Dr Katrinka Blazing [facility]- 6.923 PCO2-38.8 PO2-543 HCO3- 8.1

## 2020-01-14 NOTE — Progress Notes (Signed)
Consult placed for PIV stat. Able to get 1 20G PIV with ultrasound. Attempted for second site, unable, small veins. Recommend a central line, RN aware.

## 2020-01-14 NOTE — Consult Note (Signed)
**Note Natasha-Identified via Obfuscation** Reason for Consult: GI bleed Referring Physician: Roselie SkinnerDaniel Jacobson  Natasha Jacobson is an 69 y.o. female.  HPI: [id number]   Past Medical History:  Diagnosis Date  . 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 Alcohol-induced chronic pancreatitis (HCC)   . Brittle bone disease   . Chronic pain syndrome   . COPD (chronic obstructive pulmonary disease) (HCC)   . Hearing loss, right 07/08/2016  . Hepatitis C    Eradicated with antiviral treatment per patient  . High cholesterol   . Hypertension   . Hypopotassemia   . Hypothyroidism 07/08/2016  . Prediabetes   . SSS (sick sinus syndrome) (HCC) 09/06/2015  . Thyroid disease   . Vitamin D deficiency     Past Surgical History:  Procedure Laterality Date  . ABDOMINAL HYSTERECTOMY    . APPENDECTOMY    . ERCP     multiple with stents placed   . PACEMAKER IMPLANT    . TONSILLECTOMY      Family History  Problem Relation Age of Onset  . Lung cancer Mother   . Emphysema Mother   . Thyroid disease Mother   . Heart block Father   . Heart attack Brother   . Thyroid disease Daughter   . Thyroid disease Granddaughter     Social History:  reports that she has been smoking cigarettes. She has never used smokeless tobacco. She reports previous alcohol use. She reports previous drug use.  Allergies:  Allergies  Allergen Reactions  . Buprenorphine Nausea Only  . Aspirin Nausea Only    GI upset GI upset     Medications: I have reviewed the patient's current medications.  Results for orders placed or performed during the hospital encounter of 01/13/20 (from the past 48 hour(s))  Comprehensive metabolic panel     Status: Abnormal   Collection Time: 01/13/20  5:05 PM  Result Value Ref Range   Sodium 137 135 - 145 mmol/L   Potassium 4.3 3.5 - 5.1 mmol/L   Chloride 105 98 - 111 mmol/L   CO2 21 (L) 22 - 32 mmol/L   Glucose, Bld 112 (H) 70 - 99 mg/dL    Comment: Glucose reference range applies only to  samples taken after fasting for at least 8 hours.   BUN 32 (H) 8 - 23 mg/dL   Creatinine, Ser 0.451.68 (H) 0.44 - 1.00 mg/dL   Calcium 8.3 (L) 8.9 - 10.3 mg/dL   Total Protein 5.4 (L) 6.5 - 8.1 g/dL   Albumin 2.8 (L) 3.5 - 5.0 g/dL   AST 21 15 - 41 U/L   ALT 10 0 - 44 U/L   Alkaline Phosphatase 39 38 - 126 U/L   Total Bilirubin 0.4 0.3 - 1.2 mg/dL   GFR calc non Af Amer 31 (L) >60 mL/min   GFR calc Af Amer 36 (L) >60 mL/min   Anion gap 11 5 - 15    Comment: Performed at [facility] [place], [address] [place], [place] [address]  Lipase, blood     Status: Abnormal   Collection Time: 01/13/20  5:05 PM  Result Value Ref Range   Lipase 256 (H) 11 - 51 U/L    Comment: Performed at [facility] [place], [address] [place], [place] [address]  CBC with Differential     Status: Abnormal   Collection Time: 01/13/20  5:05 PM  Result Value Ref Range   WBC 17.9 (H)  4.0 - 10.5 K/uL   RBC 2.79 (L) 3.87 - 5.11 MIL/uL   Hemoglobin 8.4 (L) 12.0 - 15.0 g/dL   HCT 26.3 (L) 36.0 - 46.0 %   MCV 94.3 80.0 - 100.0 fL   MCH 30.1 26.0 - 34.0 pg   MCHC 31.9 30.0 - 36.0 g/dL   RDW 13.8 11.5 - 15.5 %   Platelets 293 150 - 400 K/uL   nRBC 0.0 0.0 - 0.2 %   Neutrophils Relative % 76 %   Neutro Abs 13.6 (H) 1.7 - 7.7 K/uL   Lymphocytes Relative 14 %   Lymphs Abs 2.5 0.7 - 4.0 K/uL   Monocytes Relative 8 %   Monocytes Absolute 1.5 (H) 0.1 - 1.0 K/uL   Eosinophils Relative 1 %   Eosinophils Absolute 0.1 0.0 - 0.5 K/uL   Basophils Relative 0 %   Basophils Absolute 0.1 0.0 - 0.1 K/uL   Immature Granulocytes 1 %   Abs Immature Granulocytes 0.12 (H) 0.00 - 0.07 K/uL    Comment: Performed at [facility], [address] [place], [place] [address]  SARS CORONAVIRUS 2 (TAT 6-24 HRS) Nasopharyngeal Nasopharyngeal Swab     Status: None   Collection Time: 01/13/20 10:00 PM   Specimen: Nasopharyngeal Swab  Result Value Ref Range   SARS Coronavirus 2 NEGATIVE NEGATIVE    Comment:  (NOTE) SARS-CoV-2 target nucleic acids are NOT DETECTED. The SARS-CoV-2 RNA is generally detectable in upper and lower respiratory specimens during the acute phase of infection. Negative results do not preclude SARS-CoV-2 infection, do not rule out co-infections with other pathogens, and should not be used as the sole basis for treatment or other patient management decisions. Negative results must be combined with clinical observations, patient history, and epidemiological information. The expected result is Negative. Fact Sheet for Patients: SugarRoll.be Fact Sheet for Healthcare Providers: https://www.woods-mathews.com/ This test is not yet approved or cleared by the [place] FDA and  has been authorized for detection and/or diagnosis of SARS-CoV-2 by FDA under an Emergency Use Authorization (EUA). This EUA will remain  in effect (meaning this test can be used) for the duration of the COVID-19 declaration under Section 56 4(b)(1) of the Act, 21 U.S.C. section 360bbb-3(b)(1), unless the authorization is terminated or revoked sooner. Performed at [facility], [place] [address] [place], [place] [address]   Urinalysis, Routine w reflex microscopic     Status: Abnormal   Collection Time: 01/13/20 10:30 PM  Result Value Ref Range   Color, Urine YELLOW YELLOW   APPearance CLOUDY (A) CLEAR   Specific Gravity, Urine 1.015 1.005 - 1.030   pH 5.5 5.0 - 8.0   Glucose, UA NEGATIVE NEGATIVE mg/dL   Hgb urine dipstick TRACE (A) NEGATIVE   Bilirubin Urine NEGATIVE NEGATIVE   Ketones, ur NEGATIVE NEGATIVE mg/dL   Protein, ur NEGATIVE NEGATIVE mg/dL   Nitrite NEGATIVE NEGATIVE   Leukocytes,Ua MODERATE (A) NEGATIVE    Comment: Performed at [facility], [address] [place], [place] [address]  Urinalysis, Microscopic (reflex)     Status: Abnormal   Collection Time: 01/13/20 10:30 PM  Result Value Ref Range   RBC / HPF 0-5 0 - 5  RBC/hpf   WBC, UA >50 0 - 5 WBC/hpf   Bacteria, UA FEW (A) NONE SEEN   Squamous Epithelial / LPF 0-5 0 - 5    Comment: Performed at [facility], [address] [place],  [place] [address]  HIV Antibody (routine testing w rflx)     Status: None   Collection Time: 01/14/20  5:43 AM  Result Value Ref Range   HIV Screen 4th Generation wRfx NON REACTIVE NON REACTIVE    Comment: Performed at [facility], [address] [place], [place] [address]  Glucose, capillary     Status: Abnormal   Collection Time: 01/14/20  6:12 AM  Result Value Ref Range   Glucose-Capillary 109 (H) 70 - 99 mg/dL    Comment: Glucose reference range applies only to samples taken after fasting for at least 8 hours.  Type and screen [facility]     Status: None (Preliminary result)   Collection Time: 01/14/20  9:27 AM  Result Value Ref Range   ABO/RH(D) A NEG    Antibody Screen NEG    Sample Expiration 01/17/2020,2359    Unit Number [id number]    Blood Component Type RED CELLS,LR    Unit division 00    Status of Unit ISSUED    Unit tag comment EMERGENCY RELEASE    Transfusion Status OK TO TRANSFUSE    Crossmatch Result COMPATIBLE    Unit Number [id number]    Blood Component Type RBC LR PHER2    Unit division 00    Status of Unit ISSUED    Unit tag comment EMERGENCY RELEASE    Transfusion Status OK TO TRANSFUSE    Crossmatch Result COMPATIBLE    Unit Number [id number]    Blood Component Type RED CELLS,LR    Unit division 00    Status of Unit ISSUED    Unit tag comment EMERGENCY RELEASE    Transfusion Status OK TO TRANSFUSE    Crossmatch Result COMPATIBLE    Unit Number [id number]    Blood Component Type RED CELLS,LR    Unit division 00    Status of Unit ISSUED    Unit tag comment EMERGENCY RELEASE    Transfusion Status OK TO TRANSFUSE    Crossmatch Result COMPATIBLE    Unit Number [id number]    Blood Component Type RED CELLS,LR    Unit division 00     Status of Unit ISSUED    Transfusion Status OK TO TRANSFUSE    Crossmatch Result Compatible    Unit Number [id number]    Blood Component Type RED CELLS,LR    Unit division 00    Status of Unit ISSUED    Transfusion Status OK TO TRANSFUSE    Crossmatch Result Compatible    Unit Number [id number]    Blood Component Type RED CELLS,LR    Unit division 00    Status of Unit ISSUED    Transfusion Status OK TO TRANSFUSE    Crossmatch Result Compatible    Unit Number [id number]    Blood Component Type RED CELLS,LR    Unit division 00    Status of Unit ISSUED    Transfusion Status OK TO TRANSFUSE    Crossmatch Result      Compatible Performed at [facility], [address] [place], [place] [address]    Unit Number [id number]    Blood Component Type RED CELLS,LR    Unit division 00    Status of Unit ISSUED    Transfusion Status OK TO TRANSFUSE    Crossmatch Result Compatible    Unit Number [id number]    Blood Component Type RED CELLS,LR    Unit division 00    Status of Unit ISSUED  Transfusion Status OK TO TRANSFUSE    Crossmatch Result Compatible   MRSA PCR Screening     Status: None   Collection Time: 01/14/20 10:21 AM   Specimen: Nasal Mucosa; Nasopharyngeal  Result Value Ref Range   MRSA by PCR NEGATIVE NEGATIVE    Comment:        The GeneXpert MRSA Assay (FDA approved for NASAL specimens only), is one component of a comprehensive MRSA colonization surveillance program. It is not intended to diagnose MRSA infection nor to guide or monitor treatment for MRSA infections. Performed at [facility], [address], [place], [place] [address]   Protime-INR     Status: Abnormal   Collection Time: 01/14/20 10:42 AM  Result Value Ref Range   Prothrombin Time 34.9 (H) 11.4 - 15.2 seconds   INR 3.5 (H) 0.8 - 1.2    Comment: (NOTE) INR goal varies based on device and disease states. Performed at [facility], [address]  [place], [place] [address]   CBC with Differential/Platelet     Status: Abnormal   Collection Time: 01/14/20 10:42 AM  Result Value Ref Range   WBC 38.0 (H) 4.0 - 10.5 K/uL   RBC 1.22 (L) 3.87 - 5.11 MIL/uL   Hemoglobin 3.9 (LL) 12.0 - 15.0 g/dL    Comment: REPEATED TO VERIFY THIS CRITICAL RESULT HAS VERIFIED AND BEEN CALLED TO A.LEWIS,RN BY PAMELA HENDERSON ON 04 10 2021 AT 1130, AND HAS BEEN READ BACK.     HCT 13.6 (L) 36.0 - 46.0 %   MCV 111.5 (H) 80.0 - 100.0 fL   MCH 32.0 26.0 - 34.0 pg   MCHC 28.7 (L) 30.0 - 36.0 g/dL   RDW 09.8 11.9 - 14.7 %   Platelets 189 150 - 400 K/uL   nRBC 0.0 0.0 - 0.2 %   Neutrophils Relative % 77 %   Neutro Abs 29.4 (H) 1.7 - 7.7 K/uL   Lymphocytes Relative 10 %   Lymphs Abs 3.7 0.7 - 4.0 K/uL   Monocytes Relative 7 %   Monocytes Absolute 2.6 (H) 0.1 - 1.0 K/uL   Eosinophils Relative 0 %   Eosinophils Absolute 0.1 0.0 - 0.5 K/uL   Basophils Relative 0 %   Basophils Absolute 0.0 0.0 - 0.1 K/uL   Immature Granulocytes 6 %   Abs Immature Granulocytes 2.15 (H) 0.00 - 0.07 K/uL    Comment: Performed at [facility], [address], [place], [place] [address]  Hepatic function panel     Status: Abnormal   Collection Time: 01/14/20 10:42 AM  Result Value Ref Range   Total Protein 3.1 (L) 6.5 - 8.1 g/dL   Albumin 1.6 (L) 3.5 - 5.0 g/dL   AST 213 (H) 15 - 41 U/L   ALT 124 (H) 0 - 44 U/L   Alkaline Phosphatase 50 38 - 126 U/L   Total Bilirubin 0.3 0.3 - 1.2 mg/dL   Bilirubin, Direct <0.8 0.0 - 0.2 mg/dL   Indirect Bilirubin NOT CALCULATED 0.3 - 0.9 mg/dL    Comment: Performed at [facility], [address], [place], [place] [address]  Basic metabolic panel     Status: Abnormal   Collection Time: 01/14/20 10:42 AM  Result Value Ref Range   Sodium 145 135 - 145 mmol/L   Potassium 4.8 3.5 - 5.1 mmol/L   Chloride 113 (H) 98 - 111 mmol/L   CO2 <7 (L) 22 - 32 mmol/L  Glucose, Bld 56 (L) 70 - 99 mg/dL    Comment: Glucose reference range  applies only to samples taken after fasting for at least 8 hours.   BUN 37 (H) 8 - 23 mg/dL   Creatinine, Ser 1.82 (H) 0.44 - 1.00 mg/dL   Calcium 7.7 (L) 8.9 - 10.3 mg/dL   GFR calc non Af Amer 21 (L) >60 mL/min   GFR calc Af Amer 24 (L) >60 mL/min   Anion gap NOT CALCULATED 5 - 15    Comment: Performed at [facility], [address], [place], [place] [address]  ABO/Rh     Status: None   Collection Time: 01/14/20 10:42 AM  Result Value Ref Range   ABO/RH(D)      A NEG Performed at [facility], [address], [place], [place] [address]   Glucose, capillary     Status: Abnormal   Collection Time: 01/14/20 11:20 AM  Result Value Ref Range   Glucose-Capillary 37 (LL) 70 - 99 mg/dL    Comment: Glucose reference range applies only to samples taken after fasting for at least 8 hours.  Prepare RBC (crossmatch)     Status: None   Collection Time: 01/14/20 12:52 PM  Result Value Ref Range   Order Confirmation      ORDER PROCESSED BY BLOOD BANK Performed at [facility], [address] [place], [place] [address]   Prepare RBC (crossmatch)     Status: None   Collection Time: 01/14/20  1:42 PM  Result Value Ref Range   Order Confirmation      ORDER PROCESSED BY BLOOD BANK Performed at [facility], [address], [place], [place] [address]   Prepare fresh frozen plasma     Status: None (Preliminary result)   Collection Time: 01/14/20  1:43 PM  Result Value Ref Range   Unit Number [id number]    Blood Component Type THAWED PLASMA    Unit division 00    Status of Unit ISSUED    Transfusion Status OK TO TRANSFUSE    Unit Number [id number]    Blood Component Type THAWED PLASMA    Unit division 00    Status of Unit ISSUED    Transfusion Status      OK TO TRANSFUSE Performed at [facility], [address] [place], [place] [address]    Unit Number [id number]    Blood Component Type THAWED PLASMA    Unit division 00    Status of Unit  ISSUED    Transfusion Status OK TO TRANSFUSE    Unit Number [id number]    Blood Component Type THAWED PLASMA    Unit division 00    Status of Unit ISSUED    Transfusion Status OK TO TRANSFUSE   I-STAT 7, (LYTES, BLD GAS, ICA, H+H)     Status: Abnormal   Collection Time: 01/14/20  1:48 PM  Result Value Ref Range   pH, Arterial 6.923 (LL) 7.350 - 7.450   pCO2 arterial 38.8 32.0 - 48.0 mmHg   pO2, Arterial 543.0 (H) 83.0 - 108.0 mmHg   Bicarbonate 8.1 (L) 20.0 - 28.0 mmol/L   TCO2 9 (L) 22 - 32 mmol/L   O2 Saturation 100.0 %   Acid-base deficit 23.0 (H) 0.0 - 2.0 mmol/L   Sodium 141 135 - 145 mmol/L   Potassium 4.9 3.5 - 5.1 mmol/L   Calcium, Ion 0.98 (L) 1.15 - 1.40 mmol/L   HCT  28.0 (L) 36.0 - 46.0 %   Hemoglobin 9.5 (L) 12.0 - 15.0 g/dL   Patient temperature 71.2 F    Collection site ARTERIAL LINE    Drawn by Operator    Sample type ARTERIAL    Comment NOTIFIED PHYSICIAN     DG Chest 2 View  Result Date: 01/13/2020 CLINICAL DATA:  Shortness of breath EXAM: CHEST - 2 VIEW COMPARISON:  06/16/2019 FINDINGS: Old granulomatous disease. Left pacer remains in place, unchanged. No confluent opacities or effusions. Heart is normal size. Aortic atherosclerosis. No acute bony abnormality. IMPRESSION: No acute cardiopulmonary disease. Old granulomatous disease. Electronically Signed   By: Charlett Nose M.D.   On: 01/13/2020 18:54   CT ABDOMEN PELVIS W CONTRAST  Result Date: 01/13/2020 CLINICAL DATA:  69 year old female with abdominal distention, nausea vomiting. History of chronic pancreatitis. Status post stent exchange. EXAM: CT ABDOMEN AND PELVIS WITH CONTRAST TECHNIQUE: Multidetector CT imaging of the abdomen and pelvis was performed using the standard protocol following bolus administration of intravenous contrast. CONTRAST:  87mL OMNIPAQUE IOHEXOL 300 MG/ML  SOLN COMPARISON:  CT abdomen pelvis dated 10/24/2019. FINDINGS: Lower chest: There is mild emphysematous changes of the visualized  lung bases. Right lung base linear atelectasis/scarring. There is a small right middle lobe granuloma. Pacemaker wires noted. There is no intra-abdominal free air or free fluid. Hepatobiliary: The liver is unremarkable. No intrahepatic biliary ductal dilatation. The gallbladder is unremarkable. Pancreas: There is diffuse coarse calcification of the pancreas with moderate atrophy of the distal gland sequela of chronic pancreatitis. Mild peripancreatic haziness noted. Correlation with pancreatic enzymes recommended to exclude recurrent acute pancreatitis. A pancreatic stent noted with pigtail ends in the second portion of the duodenum and in the body of the stomach. No peripancreatic fluid collection or pseudocyst. Spleen: Normal in size without focal abnormality. Adrenals/Urinary Tract: The adrenal glands are unremarkable. Which shaped areas of hypoenhancement in the kidneys bilaterally, left greater right concerning for areas of renal parenchyma infarct. These are new since the prior CT. Correlation with urinalysis recommended to exclude pyelonephritis. There is a 1 cm right renal interpolar cyst. There is no hydronephrosis on either side. The visualized ureters and urinary bladder appear unremarkable. Stomach/Bowel: The stomach is distended with ingested content. There is moderate stool throughout the colon. There is no bowel obstruction or active inflammation. Appendectomy. Vascular/Lymphatic: There is advanced aortoiliac atherosclerotic disease. There is a 2.3 cm infrarenal aortic ectasia. The IVC is unremarkable. No portal venous gas. There is no adenopathy. Reproductive: Hysterectomy. No adnexal masses. Other: None Musculoskeletal: Degenerative changes primarily at L4-L5. No acute osseous pathology. IMPRESSION: 1. Mild peripancreatic haziness may represent acute on chronic pancreatitis. Correlation with pancreatic enzymes recommended. A pancreatic stent with pigtail ends in the second portion of the duodenum  and in the body of the stomach noted. No peripancreatic fluid collection or pseudocyst. 2. Bilateral renal parenchyma wedge-shaped infarcts, new since the prior CT. Correlation with urinalysis recommended to exclude pyelonephritis. 3. No bowel obstruction. 4. Aortic Atherosclerosis (ICD10-I70.0). Electronically Signed   By: Elgie Collard M.D.   On: 01/13/2020 19:05   Portable Chest x-ray  Result Date: 01/14/2020 CLINICAL DATA:  Status post ET tube placement. EXAM: PORTABLE CHEST 1 VIEW COMPARISON:  Multiple x-rays since Feb 15, 2016 FINDINGS: The ETT is in good position terminating in the mid trachea. A right IJ terminates in the central SVC. No pneumothorax. Bilateral calcified granulomas are seen in the lungs. No suspicious nodules or masses. No focal infiltrates. The heart, hila,  mediastinum are normal. A stable pacemaker is identified. IMPRESSION: The ETT is in good position. The right IJ is in good position with no pneumothorax. No acute abnormalities. Electronically Signed   By: Gerome Sam III M.D   On: 01/14/2020 11:59    Review of Systems  All other systems reviewed and are negative.  PE Blood pressure (!) 155/73, pulse 69, temperature (!) 96 F (35.6 C), resp. rate (!) 30, height  (1.626 m), weight 50.7 kg, SpO2 100 %. Constitutional: NAD; sedated, intubated; no deformities Eyes: Moist conjunctiva; no lid lag; anicteric; PERRL Neck: Trachea midline; no thyromegaly Lungs: assisted ventilation; no tactile fremitus, tachypneic CV: RRR; no palpable thrills; no pitting edema GI: Abd soft, no guarding; no palpable hepatosplenomegaly MSK: unable to assess gait; no clubbing/cyanosis Psychiatric: sedated and somnolent, unable to assess thought content Lymphatic: No palpable cervical or axillary lymphadenopathy   Assessment/Plan: 69 yo female with history of alcohol abuse and chronic pancreatitis underwent cystogastrostomy in [facility] last week. Presented with acute GI bleed. Underwent  endoscopy today with bleeding seen in the stomach without identification of bleed. -agree with plan for IR, if this is unsuccessful I think surgery has little likelihood of successful identification and hemostasis -continue blood product resuscitation for perfusion goals -serial CBC and coags as additional end points -if INR is unable to be corrected because of liver dysfunction, likelihood of surgery improving her bleeding is very very poor  Natasha Jacobson [place] 01/14/2020, 2:44 PM

## 2020-01-14 NOTE — Progress Notes (Signed)
Hypoglycemic Event  CBG: 37  Treatment: D50 50 mL (25 gm)  Symptoms: None  Follow-up CBG: Time:1145 CBG Result:204   Possible Reasons for Event: Inadequate meal intake  Comments/MD notified:Yes     Kimberli Winne E

## 2020-01-14 NOTE — Progress Notes (Signed)
Writer finally got in touch with 58[facility] ICU nurse and gave her report on [phone] cell,talked with April,RN-she had been tied up with patient when I called earlier.

## 2020-01-14 NOTE — Progress Notes (Signed)
Platelets not able to scan into chart. Verified with Barbie Banner RN.

## 2020-01-14 NOTE — Progress Notes (Signed)
CRITICAL VALUE ALERT  Critical Value:  hgb 3.9  Date & Time Notied:  01/14/2020 11:31 AM   Provider Notified: Yes  Orders Received/Actions taken: Blood products infusing

## 2020-01-14 NOTE — Progress Notes (Signed)
GI UPDATE   Spoke with patient's daughter Natasha Jacobson, updated her on the situation. She has life threatening hemorrhage. Patient is now intubated in the ICU, on pressors. Hgb was 3.9 pre transfusion, given 4 units RBC already and 2 more ordered STAT to be given with her proecures, her BP and HR have improved. EGD recommended to clarify source - luminal bowel or pancreas, and then will see if endoscopically amenable to treatment or if this will require IR or surgical treatment. Discussed risks / benefits of the exam and they wanted / consented to proceed. Will continue to give RBCs as needed and update her after the procedure.  Ileene Patrick, MD [facility]

## 2020-01-14 NOTE — Progress Notes (Signed)
Plasma unable to scan 4 units given via rapid transfuser. Plasma verified by myself and Layne Benton RN.

## 2020-01-14 NOTE — Procedures (Signed)
Central Venous Catheter Insertion Procedure Note Adisynn Suleiman [id number] 11/13/1950  Procedure: Insertion of Central Venous Catheter Indications: Drug and/or fluid administration  Procedure Details Consent: Unable to obtain consent because of emergent medical necessity. Time Out: Verified patient identification, verified procedure, site/side was marked, verified correct patient position, special equipment/implants available, medications/allergies/relevent history reviewed, required imaging and test results available.  Performed  Maximum sterile technique was used including antiseptics, cap, gloves, gown, hand hygiene, mask and sheet. Skin prep: Chlorhexidine; local anesthetic administered A antimicrobial bonded/coated triple lumen catheter was placed in the right internal jugular vein using the Seldinger technique. Catheter placed to 15 cm. Blood aspirated via all 3 ports and then flushed x 3. Line sutured x 4 and dressing applied.  Ultrasound guidance used.Yes.    Evaluation Blood flow good Complications: No apparent complications Patient did tolerate procedure well. Chest X-ray ordered to verify placement.  CXR: pending.  Dr. Verdene Lennert Internal Medicine PGY-1  Pager: [phone] 01/14/2020, 11:23 AM

## 2020-01-14 NOTE — H&P (Signed)
History and Physical    Tehillah Cipriani LPF:[id number] DOB: 1950-11-19 DOA: 01/13/2020  PCP: Ardyth Gal, MD  Patient coming from: Home.  Chief Complaint: Abdominal pain hematemesis.  HPI: Natasha Jacobson is a 69 y.o. female with history of chronic alcoholic pancreatitis status post exchange of pancreatico gastrostomy stent at [facility] on January 12, 2020 started developing abdominal pain later in the evening which continued to worsen and the following day had some hematemesis.  Patient also had some dark stools following which patient decided to come to the ER.  Pain has been continuous epigastric in nature radiating to the back.  Denies any chest pain or shortness of breath.  ED Course: In the ER patient had a CT abdomen pelvis which shows features concerning for acute on chronic pancreatitis.  Labs show hemoglobin of 8.4 which is significant drop from 14.62 months ago.  WBC count of 17.9 UA shows features concerning for UTI.  ER physician tried to transfer patient to [facility] but since there was no bed available ER physician discussed with on-call above gastroenterologist Dr. Russella Dar and patient was admitted for further management.  Patient was started on fluid bolus pain relief medication empiric antibiotics and Protonix drip.  Review of Systems: As per HPI, rest all negative.   Past Medical History:  Diagnosis Date  . 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 Alcohol-induced chronic pancreatitis (HCC)   . Brittle bone disease   . Chronic pain syndrome   . COPD (chronic obstructive pulmonary disease) (HCC)   . Hearing loss, right 07/08/2016  . Hepatitis C    Eradicated with antiviral treatment per patient  . High cholesterol   . Hypertension   . Hypopotassemia   . Hypothyroidism 07/08/2016  . Prediabetes   . SSS (sick sinus syndrome) (HCC) 09/06/2015  . Thyroid disease   . Vitamin D deficiency     Past Surgical History:  Procedure  Laterality Date  . ABDOMINAL HYSTERECTOMY    . APPENDECTOMY    . ERCP     multiple with stents placed   . PACEMAKER IMPLANT    . TONSILLECTOMY       reports that she has been smoking cigarettes. She has never used smokeless tobacco. She reports previous alcohol use. She reports previous drug use.  Allergies  Allergen Reactions  . Buprenorphine Nausea Only  . Aspirin Nausea Only    GI upset GI upset     Family History  Problem Relation Age of Onset  . Lung cancer Mother   . Emphysema Mother   . Thyroid disease Mother   . Heart block Father   . Heart attack Brother   . Thyroid disease Daughter   . Thyroid disease Granddaughter     Prior to Admission medications   Medication Sig Start Date End Date Taking? Authorizing Provider  acetaminophen (TYLENOL) 500 MG tablet Take 2 tablets by mouth every 8 (eight) hours as needed. 11/01/19   [provider]  albuterol (VENTOLIN HFA) 108 (90 Base) MCG/ACT inhaler Inhale 2 puffs into the lungs every 4 (four) hours as needed. 11/24/18   [provider]  amLODipine (NORVASC) 5 MG tablet Take 1 tablet by mouth daily. 09/22/19   [provider]  baclofen (LIORESAL) 10 MG tablet Take 1 tablet by mouth daily as needed. 11/11/19   [provider]  ergocalciferol (VITAMIN D2) 1.25 MG (50000 UT) capsule Take 1 capsule by mouth once a week.  11/18/19   [provider]  fenofibrate (TRICOR) 48 MG tablet Take 1 tablet by mouth daily. 05/13/18   [provider]  gabapentin (NEURONTIN) 300 MG capsule Take 1 capsule by mouth 3 (three) times daily. 11/11/19 11/10/20  [provider]  levothyroxine (SYNTHROID) 50 MCG tablet Take 50 mcg by mouth daily before breakfast.    [provider]  Melatonin 5 MG CAPS Take 2 capsules by mouth at bedtime.    [provider]  Pancrelipase, Lip-Prot-Amyl, (CREON) 24000-76000 units CPEP Take 1 capsule by mouth 3 (three) times daily. 08/18/19   [provider]  pantoprazole (PROTONIX) 40 MG tablet Take 40 mg by mouth daily.    [provider]  polyethylene glycol powder (GLYCOLAX/MIRALAX) 17 GM/SCOOP powder Take 17 g by mouth as needed.    [provider]  senna (SENOKOT) 8.6 MG TABS tablet Take 1 tablet by mouth as needed for mild constipation.    [provider]  Tiotropium Bromide Monohydrate (SPIRIVA RESPIMAT) 1.25 MCG/ACT AERS Inhale 2 puffs into the lungs daily. 11/15/19 11/14/20  [provider]    Physical Exam: Constitutional: Moderately built and nourished. Vitals:   01/13/20 2253 01/14/20 0106 01/14/20 0108 01/14/20 0500  BP: (!) 107/58 111/64  124/65  Pulse: 85 90  98  Resp: 14 18  20   Temp:    97.6 F (36.4 C)  TempSrc:      SpO2: 90% 100%  99%  Height:   5\' 4"  (1.626 m)    Eyes: Anicteric no pallor. ENMT: No discharge from the ears eyes nose or mouth. Neck: No mass felt.  No neck rigidity. Respiratory: No rhonchi or crepitations. Cardiovascular: S1-S2 heard. Abdomen: Epigastric tenderness no guarding or rigidity. Musculoskeletal: No edema. Skin: No rash. Neurologic: Alert awake oriented to time place and person.  Moves all extremities. Psychiatric: Appears normal per normal affect.   Labs on Admission: I have personally reviewed following labs and imaging studies  CBC: Recent Labs  Lab 01/13/20 1705  WBC 17.9*  NEUTROABS 13.6*  HGB 8.4*  HCT 26.3*  MCV 94.3  PLT 893   Basic Metabolic Panel: Recent Labs  Lab 01/13/20 1705  NA 137  K 4.3  CL 105  CO2 21*  GLUCOSE 112*  BUN 32*  CREATININE 1.68*  CALCIUM 8.3*   GFR: CrCl cannot be calculated (Unknown ideal weight.). Liver Function Tests: Recent Labs  Lab 01/13/20 1705  AST 21  ALT 10  ALKPHOS 39  BILITOT 0.4  PROT 5.4*  ALBUMIN 2.8*   Recent Labs  Lab 01/13/20 1705  LIPASE 256*   No results for input(s): AMMONIA in the last 168 hours. Coagulation Profile: No results for input(s): INR,  PROTIME in the last 168 hours. Cardiac Enzymes: No results for input(s): CKTOTAL, CKMB, CKMBINDEX, TROPONINI in the last 168 hours. BNP (last 3 results) No results for input(s): PROBNP in the last 8760 hours. HbA1C: No results for input(s): HGBA1C in the last 72 hours. CBG: No results for input(s): GLUCAP in the last 168 hours. Lipid Profile: No results for input(s): CHOL, HDL, LDLCALC, TRIG, CHOLHDL, LDLDIRECT in the last 72 hours. Thyroid Function Tests: No results for input(s): TSH, T4TOTAL, FREET4, T3FREE, THYROIDAB in the last 72 hours. Anemia Panel: No results for input(s): VITAMINB12, FOLATE, FERRITIN, TIBC, IRON, RETICCTPCT in the last 72 hours. Urine analysis:    Component Value Date/Time   COLORURINE YELLOW 01/13/2020 2230   APPEARANCEUR CLOUDY (A) 01/13/2020 2230   LABSPEC 1.015  01/13/2020 2230   PHURINE 5.5 01/13/2020 2230   GLUCOSEU NEGATIVE 01/13/2020 2230   HGBUR TRACE (A) 01/13/2020 2230   BILIRUBINUR NEGATIVE 01/13/2020 2230   KETONESUR NEGATIVE 01/13/2020 2230   PROTEINUR NEGATIVE 01/13/2020 2230   NITRITE NEGATIVE 01/13/2020 2230   LEUKOCYTESUR MODERATE (A) 01/13/2020 2230   Sepsis Labs: @LABRCNTIP (procalcitonin:4,lacticidven:4) )No results found for this or any previous visit (from the past 240 hour(s)).   Radiological Exams on Admission: DG Chest 2 View  Result Date: 01/13/2020 CLINICAL DATA:  Shortness of breath EXAM: CHEST - 2 VIEW COMPARISON:  06/16/2019 FINDINGS: Old granulomatous disease. Left pacer remains in place, unchanged. No confluent opacities or effusions. Heart is normal size. Aortic atherosclerosis. No acute bony abnormality. IMPRESSION: No acute cardiopulmonary disease. Old granulomatous disease. Electronically Signed   By: 08/16/2019 M.D.   On: 01/13/2020 18:54   CT ABDOMEN PELVIS W CONTRAST  Result Date: 01/13/2020 CLINICAL DATA:  69 year old female with abdominal distention, nausea vomiting. History of chronic pancreatitis. Status post  stent exchange. EXAM: CT ABDOMEN AND PELVIS WITH CONTRAST TECHNIQUE: Multidetector CT imaging of the abdomen and pelvis was performed using the standard protocol following bolus administration of intravenous contrast. CONTRAST:  61mL OMNIPAQUE IOHEXOL 300 MG/ML  SOLN COMPARISON:  CT abdomen pelvis dated 10/24/2019. FINDINGS: Lower chest: There is mild emphysematous changes of the visualized lung bases. Right lung base linear atelectasis/scarring. There is a small right middle lobe granuloma. Pacemaker wires noted. There is no intra-abdominal free air or free fluid. Hepatobiliary: The liver is unremarkable. No intrahepatic biliary ductal dilatation. The gallbladder is unremarkable. Pancreas: There is diffuse coarse calcification of the pancreas with moderate atrophy of the distal gland sequela of chronic pancreatitis. Mild peripancreatic haziness noted. Correlation with pancreatic enzymes recommended to exclude recurrent acute pancreatitis. A pancreatic stent noted with pigtail ends in the second portion of the duodenum and in the body of the stomach. No peripancreatic fluid collection or pseudocyst. Spleen: Normal in size without focal abnormality. Adrenals/Urinary Tract: The adrenal glands are unremarkable. Which shaped areas of hypoenhancement in the kidneys bilaterally, left greater right concerning for areas of renal parenchyma infarct. These are new since the prior CT. Correlation with urinalysis recommended to exclude pyelonephritis. There is a 1 cm right renal interpolar cyst. There is no hydronephrosis on either side. The visualized ureters and urinary bladder appear unremarkable. Stomach/Bowel: The stomach is distended with ingested content. There is moderate stool throughout the colon. There is no bowel obstruction or active inflammation. Appendectomy. Vascular/Lymphatic: There is advanced aortoiliac atherosclerotic disease. There is a 2.3 cm infrarenal aortic ectasia. The IVC is unremarkable. No portal  venous gas. There is no adenopathy. Reproductive: Hysterectomy. No adnexal masses. Other: None Musculoskeletal: Degenerative changes primarily at L4-L5. No acute osseous pathology. IMPRESSION: 1. Mild peripancreatic haziness may represent acute on chronic pancreatitis. Correlation with pancreatic enzymes recommended. A pancreatic stent with pigtail ends in the second portion of the duodenum and in the body of the stomach noted. No peripancreatic fluid collection or pseudocyst. 2. Bilateral renal parenchyma wedge-shaped infarcts, new since the prior CT. Correlation with urinalysis recommended to exclude pyelonephritis. 3. No bowel obstruction. 4. Aortic Atherosclerosis (ICD10-I70.0). Electronically Signed   By: 10/26/2019 M.D.   On: 01/13/2020 19:05      Assessment/Plan Principal Problem:   Pancreatitis, acute Active Problems:   COPD (chronic obstructive pulmonary disease) (HCC)   Hypothyroidism   Essential hypertension   Acute pancreatitis   Hematemesis with nausea    1. Acute on  chronic pancreatitis status post ERCP and exchange of pancreatico gastrostomy stent 2 days ago at [facility].  We will keep patient on IV fluids pain really medication and n.p.o.  About GI to see. 2. Acute GI bleed for which patient has been started on Protonix infusion.  Follow serial CBC transfuse when hemoglobin less than 7.  Upper GI to see. 3. Hypothyroidism we will keep patient on IV Synthroid. 4. Hypertension we will keep patient on as needed IV hydralazine. 5. COPD not actively wheezing. 6. Possible UTI on empiric antibiotics.  Given that patient has acute pancreatitis with acute GI bleed will need close monitoring for any further deterioration in inpatient status.  Covid test is pending.   DVT prophylaxis: SCDs.  Avoiding anticoagulation due to acute GI bleed. Code Status: Full code. Family Communication: Discussed with patient. Disposition Plan: Home. Consults called: ER physician  discussed with [facility]. Admission status: Inpatient.   Eduard Clos MD [facility] Pager [phone].  If 7PM-7AM, please contact night-coverage [email or web address] Password [email or web address]  01/14/2020, 5:07 AM

## 2020-01-15 ENCOUNTER — Inpatient Hospital Stay (HOSPITAL_COMMUNITY): Payer: Medicare Other

## 2020-01-15 DIAGNOSIS — R578 Other shock: Secondary | ICD-10-CM | POA: Diagnosis not present

## 2020-01-15 DIAGNOSIS — N179 Acute kidney failure, unspecified: Secondary | ICD-10-CM | POA: Diagnosis not present

## 2020-01-15 DIAGNOSIS — D649 Anemia, unspecified: Secondary | ICD-10-CM

## 2020-01-15 DIAGNOSIS — J9601 Acute respiratory failure with hypoxia: Secondary | ICD-10-CM | POA: Diagnosis not present

## 2020-01-15 HISTORY — PX: IR ANGIOGRAM VISCERAL SELECTIVE: IMG657

## 2020-01-15 HISTORY — PX: IR EMBO ART  VEN HEMORR LYMPH EXTRAV  INC GUIDE ROADMAPPING: IMG5450

## 2020-01-15 HISTORY — PX: IR US GUIDE VASC ACCESS RIGHT: IMG2390

## 2020-01-15 HISTORY — PX: IR ANGIOGRAM SELECTIVE EACH ADDITIONAL VESSEL: IMG667

## 2020-01-15 LAB — POCT I-STAT 7, (LYTES, BLD GAS, ICA,H+H)
Acid-Base Excess: 12 mmol/L — ABNORMAL HIGH (ref 0.0–2.0)
Bicarbonate: 33.9 mmol/L — ABNORMAL HIGH (ref 20.0–28.0)
Calcium, Ion: 0.92 mmol/L — ABNORMAL LOW (ref 1.15–1.40)
HCT: 34 % — ABNORMAL LOW (ref 36.0–46.0)
Hemoglobin: 11.6 g/dL — ABNORMAL LOW (ref 12.0–15.0)
O2 Saturation: 94 %
Patient temperature: 38
Potassium: 2.8 mmol/L — ABNORMAL LOW (ref 3.5–5.1)
Sodium: 146 mmol/L — ABNORMAL HIGH (ref 135–145)
TCO2: 35 mmol/L — ABNORMAL HIGH (ref 22–32)
pCO2 arterial: 34.9 mmHg (ref 32.0–48.0)
pH, Arterial: 7.598 — ABNORMAL HIGH (ref 7.350–7.450)
pO2, Arterial: 63 mmHg — ABNORMAL LOW (ref 83.0–108.0)

## 2020-01-15 LAB — BASIC METABOLIC PANEL
Anion gap: 16 — ABNORMAL HIGH (ref 5–15)
BUN: 39 mg/dL — ABNORMAL HIGH (ref 8–23)
CO2: 30 mmol/L (ref 22–32)
Calcium: 7.5 mg/dL — ABNORMAL LOW (ref 8.9–10.3)
Chloride: 102 mmol/L (ref 98–111)
Creatinine, Ser: 2.66 mg/dL — ABNORMAL HIGH (ref 0.44–1.00)
GFR calc Af Amer: 21 mL/min — ABNORMAL LOW (ref >60)
GFR calc non Af Amer: 18 mL/min — ABNORMAL LOW (ref >60)
Glucose, Bld: 134 mg/dL — ABNORMAL HIGH (ref 70–99)
Potassium: 2.9 mmol/L — ABNORMAL LOW (ref 3.5–5.1)
Sodium: 148 mmol/L — ABNORMAL HIGH (ref 135–145)

## 2020-01-15 LAB — HEMOGLOBIN AND HEMATOCRIT, BLOOD
HCT: 34.3 % — ABNORMAL LOW (ref 36.0–46.0)
HCT: 36.2 % (ref 36.0–46.0)
HCT: 33.9 % — ABNORMAL LOW (ref 36.0–46.0)
HCT: 33 % — ABNORMAL LOW (ref 36.0–46.0)
Hemoglobin: 12.3 g/dL (ref 12.0–15.0)
Hemoglobin: 13.3 g/dL (ref 12.0–15.0)
Hemoglobin: 12.3 g/dL (ref 12.0–15.0)
Hemoglobin: 11.8 g/dL — ABNORMAL LOW (ref 12.0–15.0)

## 2020-01-15 LAB — GLUCOSE, CAPILLARY
Glucose-Capillary: 100 mg/dL — ABNORMAL HIGH (ref 70–99)
Glucose-Capillary: 128 mg/dL — ABNORMAL HIGH (ref 70–99)
Glucose-Capillary: 146 mg/dL — ABNORMAL HIGH (ref 70–99)
Glucose-Capillary: 169 mg/dL — ABNORMAL HIGH (ref 70–99)
Glucose-Capillary: 297 mg/dL — ABNORMAL HIGH (ref 70–99)
Glucose-Capillary: 93 mg/dL (ref 70–99)

## 2020-01-15 LAB — PREPARE FRESH FROZEN PLASMA
Unit division: 0
Unit division: 0
Unit division: 0
Unit division: 0
Unit division: 0
Unit division: 0

## 2020-01-15 LAB — BPAM FFP
Blood Product Expiration Date: 202104132359
Blood Product Expiration Date: 202104132359
Blood Product Expiration Date: 202104132359
Blood Product Expiration Date: 202104132359
Blood Product Expiration Date: 202104132359
Blood Product Expiration Date: 202104132359
Blood Product Expiration Date: 202104132359
Blood Product Expiration Date: 202104132359
ISSUE DATE / TIME: 202104101106
ISSUE DATE / TIME: 202104101106
ISSUE DATE / TIME: 202104101106
ISSUE DATE / TIME: 202104101108
ISSUE DATE / TIME: 202104101351
ISSUE DATE / TIME: 202104101351
ISSUE DATE / TIME: 202104101351
ISSUE DATE / TIME: 202104101351
Unit Type and Rh: 6200
Unit Type and Rh: 6200
Unit Type and Rh: 6200
Unit Type and Rh: 6200
Unit Type and Rh: 6200
Unit Type and Rh: 6200
Unit Type and Rh: 6200
Unit Type and Rh: 6200

## 2020-01-15 LAB — PROTIME-INR
INR: 1.5 — ABNORMAL HIGH (ref 0.8–1.2)
Prothrombin Time: 18.2 seconds — ABNORMAL HIGH (ref 11.4–15.2)

## 2020-01-15 LAB — BPAM PLATELET PHERESIS
Blood Product Expiration Date: 202104112359
ISSUE DATE / TIME: 202104101106
Unit Type and Rh: 1700

## 2020-01-15 LAB — PREPARE PLATELET PHERESIS: Unit division: 0

## 2020-01-15 MED ORDER — MAGNESIUM SULFATE 2 GM/50ML IV SOLN
2.0000 g | Freq: Once | INTRAVENOUS | Status: AC
  Administered 2020-01-15: 2 g via INTRAVENOUS
  Filled 2020-01-15: qty 50

## 2020-01-15 MED ORDER — POTASSIUM CHLORIDE 2 MEQ/ML IV SOLN: INTRAVENOUS | Status: DC

## 2020-01-15 MED ORDER — SODIUM CHLORIDE 0.9 % IV SOLN
1.0000 g | Freq: Once | INTRAVENOUS | Status: AC
  Administered 2020-01-15: 10:00:00 1 g via INTRAVENOUS
  Filled 2020-01-15: qty 10

## 2020-01-15 MED ORDER — PANTOPRAZOLE SODIUM 40 MG IV SOLR
40.0000 mg | Freq: Two times a day (BID) | INTRAVENOUS | Status: DC
  Administered 2020-01-15 – 2020-01-27 (×23): 40 mg via INTRAVENOUS
  Filled 2020-01-15 (×24): qty 40

## 2020-01-15 MED ORDER — SODIUM CHLORIDE 0.9 % IV SOLN
80.0000 mg | Freq: Two times a day (BID) | INTRAVENOUS | Status: AC
  Administered 2020-01-15: 80 mg via INTRAVENOUS
  Filled 2020-01-15 (×2): qty 80

## 2020-01-15 MED ORDER — DEXMEDETOMIDINE HCL IN NACL 400 MCG/100ML IV SOLN
0.4000 ug/kg/h | INTRAVENOUS | Status: DC
  Administered 2020-01-15: 16:00:00 0.5 ug/kg/h via INTRAVENOUS
  Administered 2020-01-16: 0.8 ug/kg/h via INTRAVENOUS
  Filled 2020-01-15 (×2): qty 100

## 2020-01-15 MED ORDER — ORAL CARE MOUTH RINSE
15.0000 mL | Freq: Two times a day (BID) | OROMUCOSAL | Status: DC
  Administered 2020-01-15 – 2020-01-18 (×6): 15 mL via OROMUCOSAL

## 2020-01-15 MED ORDER — POTASSIUM CHLORIDE 20 MEQ/15ML (10%) PO SOLN: 40.0000 meq | ORAL | Status: DC

## 2020-01-15 MED ORDER — POTASSIUM CHLORIDE 10 MEQ/50ML IV SOLN
10.0000 meq | INTRAVENOUS | Status: AC
  Administered 2020-01-15 (×6): 10 meq via INTRAVENOUS
  Filled 2020-01-15 (×6): qty 50

## 2020-01-15 MED ORDER — KCL IN DEXTROSE-NACL 40-5-0.45 MEQ/L-%-% IV SOLN
INTRAVENOUS | Status: DC
  Filled 2020-01-15 (×3): qty 1000

## 2020-01-15 NOTE — Progress Notes (Signed)
Critical ABG results given to Dr Katrinka Blazing [facility] H- 7.598 PCO2-34.9 PO2-63 HCO3- 33.9

## 2020-01-15 NOTE — Progress Notes (Signed)
NAME:  Natasha Jacobson, MRN:  [id number], DOB:  Jun 30, 1951, LOS: 2 ADMISSION DATE:  01/13/2020, CONSULTATION DATE:  01/14/20 REFERRING MD:  Havery Moros, CHIEF COMPLAINT:  AMS   Brief History   69 year old woman with a hx of alcoholic pancreatitis s/p recent exchange of pancreaticogastrostomy stent at [facility] (4/8) who presented to the ED on 4/9 with hematemesis. On 4/10 she developed hematochezia, encephalopathy, and hypotension. She was transferred to the ICU and taken for emergent EGD that illustrated active upper GI hemorrhage with inadequate visualization for source control. 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 She was subsequently take for selective splenic arteriogram and coiled embolization of the pseudoaneurysm (4/10).   Since embolization she is now hemodynamically stable off pressor support and hemoglobin has stabilized.   Past Medical History  Alcohol use disorder, chronic pancreatitis, COPD, CAD, HLD, HTN, MI, sick sinus syndrome  Significant Hospital Events   4/9 admitted 4/10 transfer to ICU; EGD, IR embolization  Consults:  GI  Procedures:  4/10 Intubation>> 4/10 central line>>  Significant Diagnostic Tests:  CT A/P 01/13/20>>Mild peripancreatic haziness may represent acute on chronic Pancreatitis. A pancreatic stent with pigtail ends in the second portion of the duodenum and in the body of the stomach noted. No peripancreatic fluid collection or pseudocyst. Bilateral renal parenchyma wedge-shaped infarcts, new since the prior CT.   Micro Data:  UA +, Urine culture>> 4/9 COVID Neg  Antimicrobials:  4/9 Zosyn x 1 4/10 Ceftriaxone >>   Interim history/subjective:  Taken for embolization last night. Sedated on propofol. Off pressors. Continues to have BRBPR.  Objective   Blood pressure 107/66, pulse 94, temperature (!)  101.5 F (38.6 C), resp. rate (!) 30, height 5\' 4"  (1.626 m), weight 50.7 kg, SpO2 100 %.    Vent Mode: PRVC FiO2 (%):  [40 %-100 %] 40 % Set Rate:  [18 bmp-30 bmp] 30 bmp Vt Set:  [440 mL-500 mL] 500 mL PEEP:  [5 cmH20] 5 cmH20 Plateau Pressure:  [12 cmH20-22 cmH20] 20 cmH20   Intake/Output Summary (Last 24 hours) at 01/15/2020 4098 Last data filed at 01/15/2020 0600 Gross per 24 hour  Intake 9953.47 ml  Output 2843 ml  Net 7110.47 ml   Filed Weights   01/14/20 0108  Weight: 50.7 kg   Examination: General: Elderly female, intubated and sedated HENT: Normocephalic, atraumatic, dried blood around the mouth  Pulm: Good air movement with no wheezing or crackles  CV: RRR, no murmurs, no rubs  Abdomen: Hypoactive bowel sounds, distended   Extremities: Pulses palpable in all extremities, no LE edema  Skin: Warm and dry  Neuro: Sedated with RASS of -2  Resolved Hospital Problem list   N/A  Assessment & Plan:   Hemorrhagic shock 2/2 bleeding 48mm pseudoaneurysm and recent instrumentation s/p EGD and coil embolization by IR 4/10 - Hemoglobin remains stable but continues to have BRBPR. Continue to monitor  - Off pressors currently. Levophed PRN - Switch PPI to BID dosing   Acute hypoxic respiratory failure  - Currently sedated with propofol. Wean to RASS goal of -1 to 0  - Repeat ABG and CXR this AM  - SBT once on minimal settings and sedation has been weaned.  - VAP precautions  Fever - Febrile overnight to 101.8  - Check CBC - Currently on ceftriaxone  - Repeat CXR today  -  Blood and urine cultures with no growth to date. Continue to follow.   Best practice:  Diet: NPO Pain/Anxiety/Delirium protocol (if indicated): PRNs VAP protocol (if indicated): ordered 4/10 DVT prophylaxis: SCDs GI prophylaxis: PPI gtt Glucose control: SSI Mobility: BR Code Status: Full Family Communication: will reach out Disposition: ICU  Labs   CBC: Recent Labs  Lab 01/13/20 1705  01/13/20 1705 01/14/20 1042 01/14/20 1348 01/14/20 1944 01/14/20 2140 01/15/20 0401  WBC 17.9*  --  38.0*  --   --   --   --   NEUTROABS 13.6*  --  29.4*  --   --   --   --   HGB 8.4*   < > 3.9* 9.5* 12.2 13.2 12.3  HCT 26.3*   < > 13.6* 28.0* 36.0 37.9 34.3*  MCV 94.3  --  111.5*  --   --   --   --   PLT 293  --  189  --   --   --   --    < > = values in this interval not displayed.    Basic Metabolic Panel: Recent Labs  Lab 01/13/20 1705 01/14/20 1042 01/14/20 1348 01/14/20 1944 01/14/20 2140  NA 137 145 141 144  --   K 4.3 4.8 4.9 3.3*  --   CL 105 113*  --   --   --   CO2 21* <7*  --   --   --   GLUCOSE 112* 56*  --   --   --   BUN 32* 37*  --   --   --   CREATININE 1.68* 2.38*  --   --   --   CALCIUM 8.3* 7.7*  --   --   --   MG  --   --   --   --  1.7   GFR: Estimated Creatinine Clearance: 18.6 mL/min (A) (by C-G formula based on SCr of 2.38 mg/dL (H)). Recent Labs  Lab 01/13/20 1705 01/14/20 1042  WBC 17.9* 38.0*    Liver Function Tests: Recent Labs  Lab 01/13/20 1705 01/14/20 1042  AST 21 214*  ALT 10 124*  ALKPHOS 39 50  BILITOT 0.4 0.3  PROT 5.4* 3.1*  ALBUMIN 2.8* 1.6*   Recent Labs  Lab 01/13/20 1705  LIPASE 256*   No results for input(s): AMMONIA in the last 168 hours.  ABG    Component Value Date/Time   PHART 7.433 01/14/2020 1944   PCO2ART 24.0 (L) 01/14/2020 1944   PO2ART 54.0 (L) 01/14/2020 1944   HCO3 17.0 (L) 01/14/2020 1944   TCO2 18 (L) 01/14/2020 1944   ACIDBASEDEF 7.0 (H) 01/14/2020 1944   O2SAT 95.0 01/14/2020 1944     Coagulation Profile: Recent Labs  Lab 01/14/20 1042  INR 3.5*    Cardiac Enzymes: No results for input(s): CKTOTAL, CKMB, CKMBINDEX, TROPONINI in the last 168 hours.  HbA1C: No results found for: HGBA1C  CBG: Recent Labs  Lab 01/14/20 1147 01/14/20 1500 01/14/20 2003 01/15/20 0009 01/15/20 0429  GLUCAP 204* 238* 279* 297* 169*    Review of Systems:   Too encephalopathic to  answer  Past Medical History  She,  has a past medical history of Adhesive capsulitis of left shoulder (06/06/2016), Alcohol-induced chronic pancreatitis (HCC), Brittle bone disease, Chronic pain syndrome, COPD (chronic obstructive pulmonary disease) (HCC), Hearing loss, right (07/08/2016), Hepatitis C, High cholesterol, Hypertension, Hypopotassemia, Hypothyroidism (07/08/2016), Prediabetes, SSS (sick sinus syndrome) (HCC) (09/06/2015), Thyroid disease, and Vitamin D  deficiency.   Surgical History    Past Surgical History:  Procedure Laterality Date  . ABDOMINAL HYSTERECTOMY    . APPENDECTOMY    . ERCP     multiple with stents placed   . PACEMAKER IMPLANT    . TONSILLECTOMY       Social History   reports that she has been smoking cigarettes. She has never used smokeless tobacco. She reports previous alcohol use. She reports previous drug use.   Family History   Her family history includes Emphysema in her mother; Heart attack in her brother; Heart block in her father; Lung cancer in her mother; Thyroid disease in her daughter, granddaughter, and mother.   Allergies Allergies  Allergen Reactions  . Buprenorphine Nausea Only  . Aspirin Nausea Only    GI upset GI upset      Home Medications  Prior to Admission medications   Medication Sig Start Date End Date Taking? Authorizing Provider  acetaminophen (TYLENOL) 500 MG tablet Take 2 tablets by mouth every 8 (eight) hours as needed. 11/01/19   [provider]  albuterol (VENTOLIN HFA) 108 (90 Base) MCG/ACT inhaler Inhale 2 puffs into the lungs every 4 (four) hours as needed. 11/24/18   [provider]  amLODipine (NORVASC) 5 MG tablet Take 1 tablet by mouth daily. 09/22/19   [provider]  baclofen (LIORESAL) 10 MG tablet Take 1 tablet by mouth daily as needed. 11/11/19   [provider]  ergocalciferol (VITAMIN D2) 1.25 MG (50000 UT) capsule Take 1 capsule by mouth once a week. 11/18/19   [provider]  fenofibrate (TRICOR) 48 MG tablet Take 1 tablet by mouth daily. 05/13/18   [provider]  gabapentin (NEURONTIN) 300 MG capsule Take 1 capsule by mouth 3 (three) times daily. 11/11/19 11/10/20  [provider]  levothyroxine (SYNTHROID) 50 MCG tablet Take 50 mcg by mouth daily before breakfast.    [provider]  Melatonin 5 MG CAPS Take 2 capsules by mouth at bedtime.    [provider]  Pancrelipase, Lip-Prot-Amyl, (CREON) 24000-76000 units CPEP Take 1 capsule by mouth 3 (three) times daily. 08/18/19   [provider]  pantoprazole (PROTONIX) 40 MG tablet Take 40 mg by mouth daily.    [provider]  polyethylene glycol powder (GLYCOLAX/MIRALAX) 17 GM/SCOOP powder Take 17 g by mouth as needed.    [provider]  senna (SENOKOT) 8.6 MG TABS tablet Take 1 tablet by mouth as needed for mild constipation.    [provider]  Tiotropium Bromide Monohydrate (SPIRIVA RESPIMAT) 1.25 MCG/ACT AERS Inhale 2 puffs into the lungs daily. 11/15/19 11/14/20  [provider]          Levora Dredge, MD  [facility] PGY3  Pager: [phone]

## 2020-01-15 NOTE — Progress Notes (Signed)
Subjective Intubated sedated. IR successfully embo pseudoaneurysm  Objective: Vital signs in last 24 hours: Temp:  [87.8 F (31 C)-101.8 F (38.8 C)] 101.5 F (38.6 C) (04/11 0600) Pulse Rate:  [29-163] 84 (04/11 0700) Resp:  [17-37] 20 (04/11 0700) BP: (59-193)/(35-111) 107/66 (04/11 0600) SpO2:  [65 %-100 %] 83 % (04/11 0700) Arterial Line BP: (92-133)/(45-79) 119/45 (04/11 0700) FiO2 (%):  [40 %-100 %] 40 % (04/11 0400) Last BM Date: 01/14/20  Intake/Output from previous day: 04/10 0701 - 04/11 0700 In: 9953.5 [I.V.:5096.8; Blood:4381.2; IV Piggyback:475.5] Out: 2843 [KDXIP:3825; Stool:475] Intake/Output this shift: No intake/output data recorded.  Gen: NAD, intubated and sedated CV: RRR Abd: Soft, nondistended  Lab Results: CBC  Recent Labs    01/13/20 1705 01/13/20 1705 01/14/20 1042 01/14/20 1348 01/14/20 2140 01/15/20 0401  WBC 17.9*  --  38.0*  --   --   --   HGB 8.4*   < > 3.9*   < > 13.2 12.3  HCT 26.3*   < > 13.6*   < > 37.9 34.3*  PLT 293  --  189  --   --   --    < > = values in this interval not displayed.   BMET Recent Labs    01/13/20 1705 01/13/20 1705 01/14/20 1042 01/14/20 1042 01/14/20 1348 01/14/20 1944  NA 137   < > 145   < > 141 144  K 4.3   < > 4.8   < > 4.9 3.3*  CL 105  --  113*  --   --   --   CO2 21*  --  <7*  --   --   --   GLUCOSE 112*  --  56*  --   --   --   BUN 32*  --  37*  --   --   --   CREATININE 1.68*  --  2.38*  --   --   --   CALCIUM 8.3*  --  7.7*  --   --   --    < > = values in this interval not displayed.   PT/INR Recent Labs    01/14/20 1042  LABPROT 34.9*  INR 3.5*   ABG Recent Labs    01/14/20 1348 01/14/20 1944  PHART 6.923* 7.433  HCO3 8.1* 17.0*    Studies/Results:  Anti-infectives: Anti-infectives (From admission, onward)   Start     Dose/Rate Route Frequency Ordered Stop   01/14/20 0800  cefTRIAXone (ROCEPHIN) 2 g in sodium chloride 0.9 % 100 mL IVPB     2 g 200 mL/hr over 30  Minutes Intravenous Every 24 hours 01/14/20 0508     01/13/20 2030  piperacillin-tazobactam (ZOSYN) IVPB 3.375 g     3.375 g 100 mL/hr over 30 Minutes Intravenous  Once 01/13/20 2017 01/13/20 2227       Assessment/Plan: Patient Active Problem List   Diagnosis Date Noted  . Essential hypertension 01/14/2020  . Acute pancreatitis 01/14/2020  . Gastrointestinal hemorrhage   . Acute hypoxemic respiratory failure ([place])   . Pancreatitis, acute 01/13/2020  . Generalized anxiety disorder 12/20/2019  . Alcohol-induced chronic pancreatitis ([place]) 11/17/2019  . 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 Chronic pain syndrome 08/02/2018  . Chronic hepatitis C without hepatic coma ([place]) 08/18/2017  . Hypothyroidism 07/08/2016  . Hearing loss, right 07/08/2016  . COPD (chronic obstructive pulmonary disease) ([place]) 09/06/2015  . SSS (sick sinus syndrome) ([place]) 09/06/2015   68yoF with history of alcohol  abuse and chronic pancreatitis underwent cystogastrostomy in [facility] last week. Presented with acute GI bleed. Underwent endoscopy 4/10 with bleeding seen in the stomach without identification of bleed. CTA showed 32mm pseudoaneurysm of pancreatic branch mid pancreas where stent crosses into stomach -IR successfully embolized bleeding pseudoaneurysm; has stabilized since -Coags haven't been rechecked since resuscitation. Would recommend monitoring coags and correcting if necessary -Would also recommend transfer back to [facility] for further care now that she has been stabilized; this degree of case complexity certainly warrants tertiary level of care; additionally, this stent was just placed at [facility]   LOS: 2 days   Stephanie Coup. Cliffton Asters, M.D. [facility] Use [email or web address] to contact on call provider

## 2020-01-15 NOTE — Procedures (Signed)
Extubation Procedure Note  Patient Details:   Name: Danyiel Crespin DOB: 1951-08-19 MRN: [id number]   Airway Documentation:    Vent end date: 01/15/20 Vent end time: 1344   Evaluation  O2 sats: stable throughout Complications: No apparent complications Patient did tolerate procedure well. Bilateral Breath Sounds: Clear, Diminished   Patient extubated per MD order. Placed on 4L [place]. Patient able to speak & cough post extubation.  Jacqulynn Cadet 01/15/2020, 1:50 PM

## 2020-01-15 NOTE — Progress Notes (Signed)
Referring Physician(s): Dr. Adaline Sill  Supervising Physician: Oley Balm  Patient Status:  [facility] - In-pt  Chief Complaint:  GI bleed  Subjective:  69 y.o, female inpatient. History of chronic alcoholic pancreatitis s/p pacreatico gastrostomy stent at OSH presented to the ED in [facility] with abdominal pain followed by hematemesis and dark stools found to be anemic. Finding from EGD performed on 4.10.21 read Red blood and clots filling the entire stomach and duodenum - clot appreciated at the ampulla, assuming this is overlying the stent, pancreaticogastrostomy unable to be visualized. Procedure terminated given active significant hemorrhage, poor visualization to clarify where the source is or be able to treat it. Bleeding from within the pancreas is possible given location of blood versus other pathology, although nothing otherwise noted on endoscopic procedure report from [facility] 4.8.21. On 4.10.21 IR performed an arteriogram and embolization of the mid splenic artery.  Unable to assess review of systems due to patient condition.  Allergies: Buprenorphine and Aspirin  Medications: Prior to Admission medications   Medication Sig Start Date End Date Taking? Authorizing Provider  acetaminophen (TYLENOL) 500 MG tablet Take 2 tablets by mouth every 8 (eight) hours as needed. 11/01/19   [provider]  albuterol (VENTOLIN HFA) 108 (90 Base) MCG/ACT inhaler Inhale 2 puffs into the lungs every 4 (four) hours as needed. 11/24/18   [provider]  amLODipine (NORVASC) 5 MG tablet Take 1 tablet by mouth daily. 09/22/19   [provider]  baclofen (LIORESAL) 10 MG tablet Take 1 tablet by mouth daily as needed. 11/11/19   [provider]  ergocalciferol (VITAMIN D2) 1.25 MG (50000 UT) capsule Take 1 capsule by mouth once a week. 11/18/19   [provider]  fenofibrate (TRICOR) 48 MG tablet Take 1 tablet by mouth daily. 05/13/18   [provider]   gabapentin (NEURONTIN) 300 MG capsule Take 1 capsule by mouth 3 (three) times daily. 11/11/19 11/10/20  [provider]  levothyroxine (SYNTHROID) 50 MCG tablet Take 50 mcg by mouth daily before breakfast.    [provider]  Melatonin 5 MG CAPS Take 2 capsules by mouth at bedtime.    [provider]  Pancrelipase, Lip-Prot-Amyl, (CREON) 24000-76000 units CPEP Take 1 capsule by mouth 3 (three) times daily. 08/18/19   [provider]  pantoprazole (PROTONIX) 40 MG tablet Take 40 mg by mouth daily.    [provider]  polyethylene glycol powder (GLYCOLAX/MIRALAX) 17 GM/SCOOP powder Take 17 g by mouth as needed.    [provider]  senna (SENOKOT) 8.6 MG TABS tablet Take 1 tablet by mouth as needed for mild constipation.    [provider]  Tiotropium Bromide Monohydrate (SPIRIVA RESPIMAT) 1.25 MCG/ACT AERS Inhale 2 puffs into the lungs daily. 11/15/19 11/14/20  [provider]     Vital Signs: BP (!) 65/48   Pulse 95   Temp 98.6 F (37 C)   Resp 20   Ht 5\' 4"  (1.626 m)   Wt 111 lb 12.8 oz (50.7 kg)   SpO2 100%   BMI 19.19 kg/m   Physical Exam Vitals and nursing note reviewed.  Constitutional:      Appearance: She is well-developed. She is ill-appearing.  HENT:     Head: Normocephalic and atraumatic.  Eyes:     Conjunctiva/sclera: Conjunctivae normal.  Cardiovascular:     Comments: Access site is soft with no active bleeding and no appreciable pseudoaneurysm. Dressing is C/D/I  Pulmonary:  Comments: On ventilator Musculoskeletal:        General: Normal range of motion.     Cervical back: Normal range of motion.  Skin:    General: Skin is warm.  Neurological:     Mental Status: She is oriented to person, place, and time.     Imaging: DG Chest 2 View  Result Date: 01/13/2020 CLINICAL DATA:  Shortness of breath EXAM: CHEST - 2 VIEW COMPARISON:  06/16/2019 FINDINGS: Old granulomatous disease. Left pacer  remains in place, unchanged. No confluent opacities or effusions. Heart is normal size. Aortic atherosclerosis. No acute bony abnormality. IMPRESSION: No acute cardiopulmonary disease. Old granulomatous disease. Electronically Signed   By: Charlett Nose M.D.   On: 01/13/2020 18:54   CT ANGIO ABDOMEN W &/OR WO CONTRAST  Addendum Date: 01/14/2020   ADDENDUM REPORT: 01/14/2020 16:45 ADDENDUM: PRA, Bing Neighbors, states that Dr. Deanne Coffer has viewed the CT scan and is aware of the findings. Dr. Deanne Coffer is currently performing a procedure on this patient. Electronically Signed   By: Elberta Fortis M.D.   On: 01/14/2020 16:45   Result Date: 01/14/2020 CLINICAL DATA:  GI bleed. Recent abdominal distention with nausea and vomiting. History of chronic pancreatitis. Recent stent exchange. EXAM: CT ANGIOGRAPHY ABDOMEN TECHNIQUE: Multidetector CT imaging of the abdomen was performed using the standard protocol during bolus administration of intravenous contrast. Multiplanar reconstructed images and MIPs were obtained and reviewed to evaluate the vascular anatomy. CONTRAST:  80mL OMNIPAQUE IOHEXOL 350 MG/ML SOLN COMPARISON:  01/13/2020 and 10/24/2019 FINDINGS: VASCULAR Aorta: Abdominal aorta is normal in caliber and demonstrates mild calcified plaque distally. Celiac: Celiac axis is patent. Pancreatic branch of the celiac demonstrates a 7 mm focal dilatation likely pseudoaneurysm. There is a 4 mm hyperdense focus of contrast approximately 1 cm to the left of the pseudoaneurysm as this hyperdense focus lies just inside the gastric wall with the stent crosses into the stomach. This hyperdense blush communicates with layering contrast over the dependent portion of the stomach likely acute hemorrhage. This area hemorrhage is larger on the delayed images. SMA: SMA is patent. Renals: Single bilateral renal arteries are patent with focal narrowing over the proximal 1 cm of the right renal artery. IMA: Patent diminutive IMA. Inflow:  Calcified plaque over the visualized iliac arteries which are otherwise patent. Veins: Unremarkable. Review of the MIP images confirms the above findings. NON-VASCULAR Lower chest: Minimal bibasilar dependent atelectasis. Small amount right pleural fluid. Several calcified pulmonary granulomas. Remainder of the lung bases are unremarkable. Hepatobiliary: Liver and gallbladder are unremarkable. Mild periportal edema is present. Pancreas: Changes of chronic pancreatitis with calcifications throughout the pancreas. Patient stent is unchanged which has pigtail over the second portion the duodenum and crosses the pancreas and stomach wall with pigtail in the body of the stomach. Spleen: Unremarkable. Adrenals/Urinary Tract: Adrenal glands are normal. Kidneys are normal size and demonstrate a small right renal cyst unchanged. There are slight worsening patchy areas of low-attenuation throughout the cortex which may be due to infarction. No hydronephrosis. Stomach/Bowel: Interval significant distention of the stomach containing heterogeneous density material with mottled air collection. This heterogeneous slight increased density is likely due to mixed hemorrhage. This slightly hyperdense material extends into the duodenum. The visualized proximal jejunal loops are dilated measuring up to 3.8 cm. Mild diverticulosis of the colon. Colon is air and fluid-filled. Subtle changes which could reflect mild pneumatosis over the descending colon. Lymphatic: No adenopathy. Other: Mild amount of free peritoneal fluid is present which is  new since the previous exam. No free peritoneal air. Musculoskeletal: Unchanged. IMPRESSION: VASCULAR 1. 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 There is evidence of acute hemorrhage occurring 1 cm to the left of this pseudoaneurysm just inside the gastric wall at the site of stent crossing into the stomach. 2.  Normal caliber aorta with atherosclerotic disease. Aortic Atherosclerosis (ICD10-I70.0). 3. Patent bilateral renal arteries with focal narrowing of the proximal 1 cm of the right renal artery. NON-VASCULAR 1. Interval distension of the stomach and duodenum with heterogeneous slightly hyperdense material likely hemorrhage. Presumed site of this acute hemorrhage as described above. 2. Dilated visualized proximal small bowel loops. Air and fluid throughout the visualized colon with possible pneumatosis involving the descending colon. 3. Worsening patchy bilateral low attenuation areas over the renal cortex which may be due to infarction. 4.  Worsening free peritoneal fluid. 5.  Evidence of chronic pancreatitis. Ordering physician has been paged. Electronically Signed: By: Marin Olp M.D. On: 01/14/2020 16:33   CT ABDOMEN PELVIS W CONTRAST  Result Date: 01/13/2020 CLINICAL DATA:  69 year old female with abdominal distention, nausea vomiting. History of chronic pancreatitis. Status post stent exchange. EXAM: CT ABDOMEN AND PELVIS WITH CONTRAST TECHNIQUE: Multidetector CT imaging of the abdomen and pelvis was performed using the standard protocol following bolus administration of intravenous contrast. CONTRAST:  12mL OMNIPAQUE IOHEXOL 300 MG/ML  SOLN COMPARISON:  CT abdomen pelvis dated 10/24/2019. FINDINGS: Lower chest: There is mild emphysematous changes of the visualized lung bases. Right lung base linear atelectasis/scarring. There is a small right middle lobe granuloma. Pacemaker wires noted. There is no intra-abdominal free air or free fluid. Hepatobiliary: The liver is unremarkable. No intrahepatic biliary ductal dilatation. The gallbladder is unremarkable. Pancreas: There is diffuse coarse calcification of the pancreas with moderate atrophy of the distal gland sequela of chronic pancreatitis. Mild peripancreatic haziness noted. Correlation with pancreatic enzymes recommended to exclude recurrent acute  pancreatitis. A pancreatic stent noted with pigtail ends in the second portion of the duodenum and in the body of the stomach. No peripancreatic fluid collection or pseudocyst. Spleen: Normal in size without focal abnormality. Adrenals/Urinary Tract: The adrenal glands are unremarkable. Which shaped areas of hypoenhancement in the kidneys bilaterally, left greater right concerning for areas of renal parenchyma infarct. These are new since the prior CT. Correlation with urinalysis recommended to exclude pyelonephritis. There is a 1 cm right renal interpolar cyst. There is no hydronephrosis on either side. The visualized ureters and urinary bladder appear unremarkable. Stomach/Bowel: The stomach is distended with ingested content. There is moderate stool throughout the colon. There is no bowel obstruction or active inflammation. Appendectomy. Vascular/Lymphatic: There is advanced aortoiliac atherosclerotic disease. There is a 2.3 cm infrarenal aortic ectasia. The IVC is unremarkable. No portal venous gas. There is no adenopathy. Reproductive: Hysterectomy. No adnexal masses. Other: None Musculoskeletal: Degenerative changes primarily at L4-L5. No acute osseous pathology. IMPRESSION: 1. Mild peripancreatic haziness may represent acute on chronic pancreatitis. Correlation with pancreatic enzymes recommended. A pancreatic stent with pigtail ends in the second portion of the duodenum and in the body of the stomach noted. No peripancreatic fluid collection or pseudocyst. 2. Bilateral renal parenchyma wedge-shaped infarcts, new since the prior CT. Correlation with urinalysis recommended to exclude pyelonephritis. 3. No bowel obstruction. 4. Aortic Atherosclerosis (ICD10-I70.0). Electronically Signed   By: Anner Crete M.D.   On: 01/13/2020 19:05   IR Angiogram Visceral  Selective  Result Date: 01/15/2020 CLINICAL DATA:  GI bleed. 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 EXAM: SELECTIVE  VISCERAL ARTERIOGRAPHY; ADDITIONAL ARTERIOGRAPHY; IR ULTRASOUND GUIDANCE VASC ACCESS RIGHT; IR EMBO ART VEN HEMORR LYMPH EXTRAV INC GUIDE ROADMAPPING ANESTHESIA/SEDATION: Patient was intubated and receiving IV fentanyl MEDICATIONS: Lidocaine 1% subcutaneous CONTRAST:  Omnipaque 300 IA PROCEDURE: The procedure was performed with implied emergent consent. Right femoral region prepped and draped in usual sterile fashion. Maximal barrier sterile technique was utilized including caps, mask, sterile gowns, sterile gloves, sterile drape, hand hygiene and skin antiseptic. The right common femoral artery was localized under ultrasound. Under real-time ultrasound guidance, the vessel was accessed with a 21-gauge micropuncture needle, exchanged over a 018 guidewire for a transitional dilator, through which a 035 guidewire was advanced. Over this, a 5 [place] vascular sheath was placed, through which a 5 [place] C2 catheter was advanced and used to selectively catheterize the celiac axis for selective arteriography. This was exchanged for a 5 [place] Sos catheter. Through this, a coaxial microcatheter with 016 guidewire were advanced across the lesion in the mid splenic artery. Coil embolization immediately distal to the lesion performed with 2, 3, and 4 mm interlock coils. Separately, 5 mm coils x2 placed in the pseudoaneurysm. Separately, 2, 3, and 4 mm interlock coils were used in the native splenic artery immediately proximal to the lesion. Hemostasis of flow was achieved. Follow-up arteriogram was performed. The catheter was removed. Because of coagulopathy, sheath was secured with 0 silk suture and placed to flush system. The patient tolerated the procedure well. 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 Technically successful coil embolization of the splenic artery immediately distal and proximal to the lesion.  IMPRESSION: 1. 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 Electronically Signed   By: Corlis Leak M.D.   On: 01/15/2020 09:49   IR Angiogram Selective Each Additional Vessel  Result Date: 01/15/2020 CLINICAL DATA:  GI bleed. 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 EXAM: SELECTIVE VISCERAL ARTERIOGRAPHY; ADDITIONAL ARTERIOGRAPHY; IR ULTRASOUND GUIDANCE VASC ACCESS RIGHT; IR EMBO ART VEN HEMORR LYMPH EXTRAV INC GUIDE ROADMAPPING ANESTHESIA/SEDATION: Patient was intubated and receiving IV fentanyl MEDICATIONS: Lidocaine 1% subcutaneous CONTRAST:  Omnipaque 300 IA PROCEDURE: The procedure was performed with implied emergent consent. Right femoral region prepped and draped in usual sterile fashion. Maximal barrier sterile technique was utilized including caps, mask, sterile gowns, sterile gloves, sterile drape, hand hygiene and skin antiseptic. The right common femoral artery was localized under ultrasound. Under real-time ultrasound guidance, the vessel was accessed with a 21-gauge micropuncture needle, exchanged over a 018 guidewire for a transitional dilator, through which a 035 guidewire was advanced. Over this, a 5 [place] vascular sheath was placed, through which a 5 [place] C2 catheter was advanced and used to selectively catheterize the celiac axis for selective arteriography. This was exchanged for a 5 [place] Sos catheter. Through this, a coaxial microcatheter with 016 guidewire were advanced across the lesion in the mid splenic artery. Coil embolization immediately distal to the lesion performed with 2, 3, and 4 mm interlock coils. Separately, 5 mm coils x2 placed in the pseudoaneurysm. Separately, 2, 3, and 4 mm interlock coils were used in the native splenic artery immediately proximal to the lesion. Hemostasis of flow was achieved. Follow-up arteriogram was performed. The catheter was removed. Because of  coagulopathy, sheath was secured with 0 silk  suture and placed to flush system. The patient tolerated the procedure well. 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 Technically successful coil embolization of the splenic artery immediately distal and proximal to the lesion. IMPRESSION: 1. 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 Electronically Signed   By: Corlis Leak M.D.   On: 01/15/2020 09:49   IR US Guide Vasc Access Right  Result Date: 01/15/2020 CLINICAL DATA:  GI bleed. 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 EXAM: SELECTIVE VISCERAL ARTERIOGRAPHY; ADDITIONAL ARTERIOGRAPHY; IR ULTRASOUND GUIDANCE VASC ACCESS RIGHT; IR EMBO ART VEN HEMORR LYMPH EXTRAV INC GUIDE ROADMAPPING ANESTHESIA/SEDATION: Patient was intubated and receiving IV fentanyl MEDICATIONS: Lidocaine 1% subcutaneous CONTRAST:  Omnipaque 300 IA PROCEDURE: The procedure was performed with implied emergent consent. Right femoral region prepped and draped in usual sterile fashion. Maximal barrier sterile technique was utilized including caps, mask, sterile gowns, sterile gloves, sterile drape, hand hygiene and skin antiseptic. The right common femoral artery was localized under ultrasound. Under real-time ultrasound guidance, the vessel was accessed with a 21-gauge micropuncture needle, exchanged over a 018 guidewire for a transitional dilator, through which a 035 guidewire was advanced. Over this, a 5 [place] vascular sheath was placed, through which a 5 [place] C2 catheter was advanced and used to selectively catheterize the celiac axis for selective arteriography. This was exchanged for a 5 [place] Sos catheter. Through this, a coaxial microcatheter with 016 guidewire were advanced across the lesion in the mid splenic artery. Coil embolization  immediately distal to the lesion performed with 2, 3, and 4 mm interlock coils. Separately, 5 mm coils x2 placed in the pseudoaneurysm. Separately, 2, 3, and 4 mm interlock coils were used in the native splenic artery immediately proximal to the lesion. Hemostasis of flow was achieved. Follow-up arteriogram was performed. The catheter was removed. Because of coagulopathy, sheath was secured with 0 silk suture and placed to flush system. The patient tolerated the procedure well. 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 Technically successful coil embolization of the splenic artery immediately distal and proximal to the lesion. IMPRESSION: 1. 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 Electronically Signed   By: Corlis Leak M.D.   On: 01/15/2020 09:49   DG CHEST PORT 1 VIEW  Result Date: 01/15/2020 CLINICAL DATA:  ETT placement. EXAM: PORTABLE CHEST 1 VIEW COMPARISON:  January 14, 2020 FINDINGS: The ETT is in good position. Stable pacemaker. The heart, hila, mediastinum are normal. A right central line is again identified. No pneumothorax. Mild interstitial prominence may represent pulmonary venous congestion. No overt edema. Mild bibasilar atelectasis. No suspicious infiltrates. IMPRESSION: 1. Support apparatus as above. 2. Suggested mild pulmonary venous congestion. Electronically Signed   By: Gerome Sam III M.D   On: 01/15/2020 11:24   Portable Chest x-ray  Result Date: 01/14/2020 CLINICAL DATA:  Status post ET tube placement. EXAM: PORTABLE CHEST 1 VIEW COMPARISON:  Multiple x-rays since Feb 15, 2016 FINDINGS: The ETT is in good position terminating in the mid trachea. A right IJ terminates in the central SVC. No pneumothorax. Bilateral calcified granulomas are seen in the lungs. No suspicious nodules or masses. No focal  infiltrates. The heart, hila, mediastinum are normal. A stable pacemaker is identified. IMPRESSION: The ETT is in good position. The right  IJ is in good position with no pneumothorax. No acute abnormalities. Electronically Signed   By: Gerome Sam III M.D   On: 01/14/2020 11:59   IR EMBO ART  VEN HEMORR LYMPH EXTRAV  INC GUIDE ROADMAPPING  Result Date: 01/15/2020 CLINICAL DATA:  GI bleed. 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 EXAM: SELECTIVE VISCERAL ARTERIOGRAPHY; ADDITIONAL ARTERIOGRAPHY; IR ULTRASOUND GUIDANCE VASC ACCESS RIGHT; IR EMBO ART VEN HEMORR LYMPH EXTRAV INC GUIDE ROADMAPPING ANESTHESIA/SEDATION: Patient was intubated and receiving IV fentanyl MEDICATIONS: Lidocaine 1% subcutaneous CONTRAST:  Omnipaque 300 IA PROCEDURE: The procedure was performed with implied emergent consent. Right femoral region prepped and draped in usual sterile fashion. Maximal barrier sterile technique was utilized including caps, mask, sterile gowns, sterile gloves, sterile drape, hand hygiene and skin antiseptic. The right common femoral artery was localized under ultrasound. Under real-time ultrasound guidance, the vessel was accessed with a 21-gauge micropuncture needle, exchanged over a 018 guidewire for a transitional dilator, through which a 035 guidewire was advanced. Over this, a 5 [place] vascular sheath was placed, through which a 5 [place] C2 catheter was advanced and used to selectively catheterize the celiac axis for selective arteriography. This was exchanged for a 5 [place] Sos catheter. Through this, a coaxial microcatheter with 016 guidewire were advanced across the lesion in the mid splenic artery. Coil embolization immediately distal to the lesion performed with 2, 3, and 4 mm interlock coils. Separately, 5 mm coils x2 placed in the pseudoaneurysm. Separately, 2, 3, and 4 mm interlock coils were used in the native splenic artery immediately proximal to the lesion. Hemostasis  of flow was achieved. Follow-up arteriogram was performed. The catheter was removed. Because of coagulopathy, sheath was secured with 0 silk suture and placed to flush system. The patient tolerated the procedure well. 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 Technically successful coil embolization of the splenic artery immediately distal and proximal to the lesion. IMPRESSION: 1. 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 Electronically Signed   By: Corlis Leak M.D.   On: 01/15/2020 09:49    Labs:  CBC: Recent Labs    10/24/19 1229 10/24/19 1229 11/17/19 1146 11/17/19 1146 01/13/20 1705 01/13/20 1705 01/14/20 1042 01/14/20 1348 01/14/20 2140 01/15/20 0401 01/15/20 0833 01/15/20 1013  WBC 9.3  --  9.3  --  17.9*  --  38.0*  --   --   --   --   --   HGB 16.1*   < > 14.6   < > 8.4*   < > 3.9*   < > 13.2 12.3 11.6* 13.3  HCT 49.4*   < > 44.0   < > 26.3*   < > 13.6*   < > 37.9 34.3* 34.0* 36.2  PLT 373  --  373.0  --  293  --  189  --   --   --   --   --    < > = values in this interval not displayed.    COAGS: Recent Labs    01/14/20 1042 01/15/20 1013  INR 3.5* 1.5*    BMP: Recent Labs    10/24/19 1229 10/24/19 1229 11/17/19 1146 11/17/19 1146 01/13/20 1705 01/13/20 1705 01/14/20 1042 01/14/20 1042 01/14/20 1348 01/14/20 1944 01/15/20 0833 01/15/20 0837  NA 137   < > 135   < > 137   < > 145   < >  141 144 146* 148*  K 3.7   < > 4.4   < > 4.3   < > 4.8   < > 4.9 3.3* 2.8* 2.9*  CL 104   < > 102  --  105  --  113*  --   --   --   --  102  CO2 24   < > 26  --  21*  --  <7*  --   --   --   --  30  GLUCOSE 100*   < > 90  --  112*  --  56*  --   --   --   --  134*  BUN 13   < > 19  --  32*  --  37*  --   --   --   --  39*  CALCIUM 9.8   < > 10.1  --  8.3*  --  7.7*  --   --   --   --  7.5*   CREATININE 1.04*   < > 1.10  --  1.68*  --  2.38*  --   --   --   --  2.66*  GFRNONAA 56*  --   --   --  31*  --  21*  --   --   --   --  18*  GFRAA >60  --   --   --  36*  --  24*  --   --   --   --  21*   < > = values in this interval not displayed.    LIVER FUNCTION TESTS: Recent Labs    10/24/19 1229 11/17/19 1146 01/13/20 1705 01/14/20 1042  BILITOT 0.5 0.5 0.4 0.3  AST 33 20 21 214*  ALT 16 8 10  124*  ALKPHOS 59 67 39 50  PROT 8.0 8.2 5.4* 3.1*  ALBUMIN 4.1 4.3 2.8* 1.6*    Assessment and Plan:  69 y.o, female inpatient. History of chronic alcoholic pancreatitis s/p pacreatico gastrostomy stent at OSH [facility] with abdominal pain followed by hematemesis and dark stools found to be anemic. Finding from EGD performed on 4.10.21 read Red blood and clots filling the entire stomach and duodenum - clot appreciated at the ampulla, assuming this is overlying the stent, pancreaticogastrostomy unable to be visualized. Procedure terminated given active significant hemorrhage, poor visualization to clarify where the source is or be able to treat it. Bleeding from within the pancreas is possible given location of blood versus other pathology, although nothing otherwise noted [facility]. On 4.10.21 IR performed an arteriogram and embolization of the mid splenic artery.  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  INR 1.5,  BUN 39, Cr 2.66 Hgb  Patient remains intubated on pressors critically ill at this time.    IR will continue to follow along - plans per [facility]  Electronically Signed: Marletta Lormohundro, Chelbi Herber Corey, NP 01/15/2020, 12:17 PM   I spent a total of 25 Minutes at the the patient's bedside AND on the patient's hospital floor or unit,  greater than 50% of which was counseling/coordinating care for s/p Arteriogram with embolization

## 2020-01-15 NOTE — Progress Notes (Signed)
Progress Note   Subjective  Patient extubated. Hgb stable. She has altered mental state, not able to answer questions appropriately. Passed some dark bowel movements this AM.   Objective   Vital signs in last 24 hours: Temp:  [87.8 F (31 C)-101.8 F (38.8 C)] 97 F (36.1 C) (04/11 1600) Pulse Rate:  [30-96] 94 (04/11 1600) Resp:  [20-33] 29 (04/11 1600) BP: (65-193)/(48-101) 140/61 (04/11 1600) SpO2:  [65 %-100 %] 96 % (04/11 1600) Arterial Line BP: (92-176)/(45-79) 176/68 (04/11 1600) FiO2 (%):  [40 %-100 %] 40 % (04/11 1110) Last BM Date: 01/15/20 General:    white female, now extubated Heart:  Regular rate  Abdomen:  Soft, some epigastric TTP,  nondistended.     Intake/Output from previous day: 04/10 0701 - 04/11 0700 In: 9953.5 [I.V.:5096.8; Blood:4381.2; IV Piggyback:475.5] Out: 2843 [Urine:1368; Stool:475] Intake/Output this shift: Total I/O In: 1676.6 [I.V.:1116.1; IV Piggyback:560.5] Out: 350 [Urine:350]  Lab Results: Recent Labs    01/13/20 1705 01/13/20 1705 01/14/20 1042 01/14/20 1348 01/15/20 0401 01/15/20 0833 01/15/20 1013  WBC 17.9*  --  38.0*  --   --   --   --   HGB 8.4*   < > 3.9*   < > 12.3 11.6* 13.3  HCT 26.3*   < > 13.6*   < > 34.3* 34.0* 36.2  PLT 293  --  189  --   --   --   --    < > = values in this interval not displayed.   BMET Recent Labs    01/13/20 1705 01/13/20 1705 01/14/20 1042 01/14/20 1348 01/14/20 1944 01/15/20 0833 01/15/20 0837  NA 137   < > 145   < > 144 146* 148*  K 4.3   < > 4.8   < > 3.3* 2.8* 2.9*  CL 105  --  113*  --   --   --  102  CO2 21*  --  <7*  --   --   --  30  GLUCOSE 112*  --  56*  --   --   --  134*  BUN 32*  --  37*  --   --   --  39*  CREATININE 1.68*  --  2.38*  --   --   --  2.66*  CALCIUM 8.3*  --  7.7*  --   --   --  7.5*   < > = values in this interval not displayed.   LFT Recent Labs    01/14/20 1042  PROT 3.1*  ALBUMIN 1.6*  AST 214*  ALT 124*  ALKPHOS 50  BILITOT  0.3  BILIDIR <0.1  IBILI NOT CALCULATED   PT/INR Recent Labs    01/14/20 1042 01/15/20 1013  LABPROT 34.9* 18.2*  INR 3.5* 1.5*    Studies/Results: DG Chest 2 View  Result Date: 01/13/2020 CLINICAL DATA:  Shortness of breath EXAM: CHEST - 2 VIEW COMPARISON:  06/16/2019 FINDINGS: Old granulomatous disease. Left pacer remains in place, unchanged. No confluent opacities or effusions. Heart is normal size. Aortic atherosclerosis. No acute bony abnormality. IMPRESSION: No acute cardiopulmonary disease. Old granulomatous disease. Electronically Signed   By: Charlett Nose M.D.   On: 01/13/2020 18:54   CT ANGIO ABDOMEN W &/OR WO CONTRAST  Addendum Date: 01/14/2020   ADDENDUM REPORT: 01/14/2020 16:45 ADDENDUM: PRA, Bing Neighbors, states that Dr. Deanne Coffer has viewed the CT scan and is aware of the findings. Dr. Deanne Coffer is currently  performing a procedure on this patient. Electronically Signed   By: Elberta Fortis M.D.   On: 01/14/2020 16:45   Result Date: 01/14/2020 CLINICAL DATA:  GI bleed. Recent abdominal distention with nausea and vomiting. History of chronic pancreatitis. Recent stent exchange. EXAM: CT ANGIOGRAPHY ABDOMEN TECHNIQUE: Multidetector CT imaging of the abdomen was performed using the standard protocol during bolus administration of intravenous contrast. Multiplanar reconstructed images and MIPs were obtained and reviewed to evaluate the vascular anatomy. CONTRAST:  54mL OMNIPAQUE IOHEXOL 350 MG/ML SOLN COMPARISON:  01/13/2020 and 10/24/2019 FINDINGS: VASCULAR Aorta: Abdominal aorta is normal in caliber and demonstrates mild calcified plaque distally. Celiac: Celiac axis is patent. Pancreatic branch of the celiac demonstrates a 7 mm focal dilatation likely pseudoaneurysm. There is a 4 mm hyperdense focus of contrast approximately 1 cm to the left of the pseudoaneurysm as this hyperdense focus lies just inside the gastric wall with the stent crosses into the stomach. This hyperdense blush  communicates with layering contrast over the dependent portion of the stomach likely acute hemorrhage. This area hemorrhage is larger on the delayed images. SMA: SMA is patent. Renals: Single bilateral renal arteries are patent with focal narrowing over the proximal 1 cm of the right renal artery. IMA: Patent diminutive IMA. Inflow: Calcified plaque over the visualized iliac arteries which are otherwise patent. Veins: Unremarkable. Review of the MIP images confirms the above findings. NON-VASCULAR Lower chest: Minimal bibasilar dependent atelectasis. Small amount right pleural fluid. Several calcified pulmonary granulomas. Remainder of the lung bases are unremarkable. Hepatobiliary: Liver and gallbladder are unremarkable. Mild periportal edema is present. Pancreas: Changes of chronic pancreatitis with calcifications throughout the pancreas. Patient stent is unchanged which has pigtail over the second portion the duodenum and crosses the pancreas and stomach wall with pigtail in the body of the stomach. Spleen: Unremarkable. Adrenals/Urinary Tract: Adrenal glands are normal. Kidneys are normal size and demonstrate a small right renal cyst unchanged. There are slight worsening patchy areas of low-attenuation throughout the cortex which may be due to infarction. No hydronephrosis. Stomach/Bowel: Interval significant distention of the stomach containing heterogeneous density material with mottled air collection. This heterogeneous slight increased density is likely due to mixed hemorrhage. This slightly hyperdense material extends into the duodenum. The visualized proximal jejunal loops are dilated measuring up to 3.8 cm. Mild diverticulosis of the colon. Colon is air and fluid-filled. Subtle changes which could reflect mild pneumatosis over the descending colon. Lymphatic: No adenopathy. Other: Mild amount of free peritoneal fluid is present which is new since the previous exam. No free peritoneal air. Musculoskeletal:  Unchanged. IMPRESSION: VASCULAR 1. 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 There is evidence of acute hemorrhage occurring 1 cm to the left of this pseudoaneurysm just inside the gastric wall at the site of stent crossing into the stomach. 2. Normal caliber aorta with atherosclerotic disease. Aortic Atherosclerosis (ICD10-I70.0). 3. Patent bilateral renal arteries with focal narrowing of the proximal 1 cm of the right renal artery. NON-VASCULAR 1. Interval distension of the stomach and duodenum with heterogeneous slightly hyperdense material likely hemorrhage. Presumed site of this acute hemorrhage as described above. 2. Dilated visualized proximal small bowel loops. Air and fluid throughout the visualized colon with possible pneumatosis involving the descending colon. 3. Worsening patchy bilateral low attenuation areas over the renal cortex which may be due to infarction. 4.  Worsening free peritoneal fluid. 5.  Evidence  of chronic pancreatitis. Ordering physician has been paged. Electronically Signed: By: Elberta Fortisaniel  Boyle M.D. On: 01/14/2020 16:33   CT ABDOMEN PELVIS W CONTRAST  Result Date: 01/13/2020 CLINICAL DATA:  69 year old female with abdominal distention, nausea vomiting. History of chronic pancreatitis. Status post stent exchange. EXAM: CT ABDOMEN AND PELVIS WITH CONTRAST TECHNIQUE: Multidetector CT imaging of the abdomen and pelvis was performed using the standard protocol following bolus administration of intravenous contrast. CONTRAST:  80mL OMNIPAQUE IOHEXOL 300 MG/ML  SOLN COMPARISON:  CT abdomen pelvis dated 10/24/2019. FINDINGS: Lower chest: There is mild emphysematous changes of the visualized lung bases. Right lung base linear atelectasis/scarring. There is a small right middle lobe granuloma. Pacemaker wires noted. There is no intra-abdominal free air or free fluid. Hepatobiliary: The liver is unremarkable.  No intrahepatic biliary ductal dilatation. The gallbladder is unremarkable. Pancreas: There is diffuse coarse calcification of the pancreas with moderate atrophy of the distal gland sequela of chronic pancreatitis. Mild peripancreatic haziness noted. Correlation with pancreatic enzymes recommended to exclude recurrent acute pancreatitis. A pancreatic stent noted with pigtail ends in the second portion of the duodenum and in the body of the stomach. No peripancreatic fluid collection or pseudocyst. Spleen: Normal in size without focal abnormality. Adrenals/Urinary Tract: The adrenal glands are unremarkable. Which shaped areas of hypoenhancement in the kidneys bilaterally, left greater right concerning for areas of renal parenchyma infarct. These are new since the prior CT. Correlation with urinalysis recommended to exclude pyelonephritis. There is a 1 cm right renal interpolar cyst. There is no hydronephrosis on either side. The visualized ureters and urinary bladder appear unremarkable. Stomach/Bowel: The stomach is distended with ingested content. There is moderate stool throughout the colon. There is no bowel obstruction or active inflammation. Appendectomy. Vascular/Lymphatic: There is advanced aortoiliac atherosclerotic disease. There is a 2.3 cm infrarenal aortic ectasia. The IVC is unremarkable. No portal venous gas. There is no adenopathy. Reproductive: Hysterectomy. No adnexal masses. Other: None Musculoskeletal: Degenerative changes primarily at L4-L5. No acute osseous pathology. IMPRESSION: 1. Mild peripancreatic haziness may represent acute on chronic pancreatitis. Correlation with pancreatic enzymes recommended. A pancreatic stent with pigtail ends in the second portion of the duodenum and in the body of the stomach noted. No peripancreatic fluid collection or pseudocyst. 2. Bilateral renal parenchyma wedge-shaped infarcts, new since the prior CT. Correlation with urinalysis recommended to exclude  pyelonephritis. 3. No bowel obstruction. 4. Aortic Atherosclerosis (ICD10-I70.0). Electronically Signed   By: Elgie CollardArash  Radparvar M.D.   On: 01/13/2020 19:05   IR Angiogram Visceral Selective  Result Date: 01/15/2020 CLINICAL DATA:  GI bleed. 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 EXAM: SELECTIVE VISCERAL ARTERIOGRAPHY; ADDITIONAL ARTERIOGRAPHY; IR ULTRASOUND GUIDANCE VASC ACCESS RIGHT; IR EMBO ART VEN HEMORR LYMPH EXTRAV INC GUIDE ROADMAPPING ANESTHESIA/SEDATION: Patient was intubated and receiving IV fentanyl MEDICATIONS: Lidocaine 1% subcutaneous CONTRAST:  Omnipaque 300 IA PROCEDURE: The procedure was performed with implied emergent consent. Right femoral region prepped and draped in usual sterile fashion. Maximal barrier sterile technique was utilized including caps, mask, sterile gowns, sterile gloves, sterile drape, hand hygiene and skin antiseptic. The right common femoral artery was localized under ultrasound. Under real-time ultrasound guidance, the vessel was accessed with a 21-gauge micropuncture needle, exchanged over a 018 guidewire for a transitional dilator, through which a 035 guidewire was advanced. Over this, a 5 [place] vascular sheath was placed, through which a 5 [place] C2 catheter was advanced and used to selectively catheterize the celiac axis for selective arteriography. This was exchanged for  a 5 [place] Sos catheter. Through this, a coaxial microcatheter with 016 guidewire were advanced across the lesion in the mid splenic artery. Coil embolization immediately distal to the lesion performed with 2, 3, and 4 mm interlock coils. Separately, 5 mm coils x2 placed in the pseudoaneurysm. Separately, 2, 3, and 4 mm interlock coils were used in the native splenic artery immediately proximal to the lesion. Hemostasis of flow was achieved. Follow-up arteriogram was performed. The catheter was removed. Because of coagulopathy, sheath was secured with 0 silk suture and  placed to flush system. The patient tolerated the procedure well. 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 Technically successful coil embolization of the splenic artery immediately distal and proximal to the lesion. IMPRESSION: 1. 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 Electronically Signed   By: Lucrezia Europe M.D.   On: 01/15/2020 09:49   IR Angiogram Selective Each Additional Vessel  Result Date: 01/15/2020 CLINICAL DATA:  GI bleed. 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 EXAM: SELECTIVE VISCERAL ARTERIOGRAPHY; ADDITIONAL ARTERIOGRAPHY; IR ULTRASOUND GUIDANCE VASC ACCESS RIGHT; IR EMBO ART VEN HEMORR LYMPH EXTRAV INC GUIDE ROADMAPPING ANESTHESIA/SEDATION: Patient was intubated and receiving IV fentanyl MEDICATIONS: Lidocaine 1% subcutaneous CONTRAST:  Omnipaque 300 IA PROCEDURE: The procedure was performed with implied emergent consent. Right femoral region prepped and draped in usual sterile fashion. Maximal barrier sterile technique was utilized including caps, mask, sterile gowns, sterile gloves, sterile drape, hand hygiene and skin antiseptic. The right common femoral artery was localized under ultrasound. Under real-time ultrasound guidance, the vessel was accessed with a 21-gauge micropuncture needle, exchanged over a 018 guidewire for a transitional dilator, through which a 035 guidewire was advanced. Over this, a 5 [place] vascular sheath was placed, through which a 5 [place] C2 catheter was advanced and used to selectively catheterize the celiac axis for selective arteriography. This was exchanged for a 5 [place] Sos catheter. Through this, a coaxial microcatheter with 016 guidewire were advanced across the lesion in the mid splenic artery. Coil embolization immediately distal to the lesion performed  with 2, 3, and 4 mm interlock coils. Separately, 5 mm coils x2 placed in the pseudoaneurysm. Separately, 2, 3, and 4 mm interlock coils were used in the native splenic artery immediately proximal to the lesion. Hemostasis of flow was achieved. Follow-up arteriogram was performed. The catheter was removed. Because of coagulopathy, sheath was secured with 0 silk suture and placed to flush system. The patient tolerated the procedure well. 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 Technically successful coil embolization of the splenic artery immediately distal and proximal to the lesion. IMPRESSION: 1. 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 Electronically Signed   By: Lucrezia Europe M.D.   On: 01/15/2020 09:49   IR US Guide Vasc Access Right  Result Date: 01/15/2020 CLINICAL DATA:  GI bleed. 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 EXAM: SELECTIVE VISCERAL ARTERIOGRAPHY; ADDITIONAL ARTERIOGRAPHY; IR ULTRASOUND GUIDANCE VASC ACCESS RIGHT; IR EMBO ART VEN HEMORR LYMPH EXTRAV INC GUIDE ROADMAPPING ANESTHESIA/SEDATION: Patient was intubated and receiving IV fentanyl MEDICATIONS: Lidocaine 1% subcutaneous CONTRAST:  Omnipaque 300 IA PROCEDURE: The procedure was performed with implied emergent consent. Right femoral region prepped and draped in usual sterile fashion. Maximal barrier sterile technique was utilized including caps, mask,  sterile gowns, sterile gloves, sterile drape, hand hygiene and skin antiseptic. The right common femoral artery was localized under ultrasound. Under real-time ultrasound guidance, the vessel was accessed with a 21-gauge micropuncture needle, exchanged over a 018 guidewire for a transitional dilator, through which a 035 guidewire was advanced. Over this, a 5 [place] vascular sheath was placed,  through which a 5 [place] C2 catheter was advanced and used to selectively catheterize the celiac axis for selective arteriography. This was exchanged for a 5 [place] Sos catheter. Through this, a coaxial microcatheter with 016 guidewire were advanced across the lesion in the mid splenic artery. Coil embolization immediately distal to the lesion performed with 2, 3, and 4 mm interlock coils. Separately, 5 mm coils x2 placed in the pseudoaneurysm. Separately, 2, 3, and 4 mm interlock coils were used in the native splenic artery immediately proximal to the lesion. Hemostasis of flow was achieved. Follow-up arteriogram was performed. The catheter was removed. Because of coagulopathy, sheath was secured with 0 silk suture and placed to flush system. The patient tolerated the procedure well. 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 Technically successful coil embolization of the splenic artery immediately distal and proximal to the lesion. IMPRESSION: 1. 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 Electronically Signed   By: Corlis Leak M.D.   On: 01/15/2020 09:49   DG CHEST PORT 1 VIEW  Result Date: 01/15/2020 CLINICAL DATA:  ETT placement. EXAM: PORTABLE CHEST 1 VIEW COMPARISON:  January 14, 2020 FINDINGS: The ETT is in good position. Stable pacemaker. The heart, hila, mediastinum are normal. A right central line is again identified. No pneumothorax. Mild interstitial prominence may represent pulmonary venous congestion. No overt edema. Mild bibasilar atelectasis. No suspicious infiltrates. IMPRESSION: 1. Support apparatus as above. 2. Suggested mild pulmonary venous congestion. Electronically Signed   By: Gerome Sam III M.D   On: 01/15/2020 11:24   Portable Chest x-ray  Result Date: 01/14/2020 CLINICAL DATA:  Status post ET tube placement.  EXAM: PORTABLE CHEST 1 VIEW COMPARISON:  Multiple x-rays since Feb 15, 2016 FINDINGS: The ETT is in good position terminating in the mid trachea. A right IJ terminates in the central SVC. No pneumothorax. Bilateral calcified granulomas are seen in the lungs. No suspicious nodules or masses. No focal infiltrates. The heart, hila, mediastinum are normal. A stable pacemaker is identified. IMPRESSION: The ETT is in good position. The right IJ is in good position with no pneumothorax. No acute abnormalities. Electronically Signed   By: Gerome Sam III M.D   On: 01/14/2020 11:59   IR EMBO ART  VEN HEMORR LYMPH EXTRAV  INC GUIDE ROADMAPPING  Result Date: 01/15/2020 CLINICAL DATA:  GI bleed. 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 EXAM: SELECTIVE VISCERAL ARTERIOGRAPHY; ADDITIONAL ARTERIOGRAPHY; IR ULTRASOUND GUIDANCE VASC ACCESS RIGHT; IR EMBO ART VEN HEMORR LYMPH EXTRAV INC GUIDE ROADMAPPING ANESTHESIA/SEDATION: Patient was intubated and receiving IV fentanyl MEDICATIONS: Lidocaine 1% subcutaneous CONTRAST:  Omnipaque 300 IA PROCEDURE: The procedure was performed with implied emergent consent. Right femoral region prepped and draped in usual sterile fashion. Maximal barrier sterile technique was utilized including caps, mask, sterile gowns, sterile gloves, sterile drape, hand hygiene and skin antiseptic. The right common femoral artery was localized under ultrasound. Under real-time ultrasound guidance, the vessel was accessed with a 21-gauge micropuncture needle, exchanged over a 018 guidewire for a transitional dilator, through which  a 035 guidewire was advanced. Over this, a 5 [place] vascular sheath was placed, through which a 5 [place] C2 catheter was advanced and used to selectively catheterize the celiac axis for selective arteriography. This was exchanged for a 5 [place] Sos catheter. Through this, a coaxial microcatheter with 016 guidewire were advanced across the lesion in the  mid splenic artery. Coil embolization immediately distal to the lesion performed with 2, 3, and 4 mm interlock coils. Separately, 5 mm coils x2 placed in the pseudoaneurysm. Separately, 2, 3, and 4 mm interlock coils were used in the native splenic artery immediately proximal to the lesion. Hemostasis of flow was achieved. Follow-up arteriogram was performed. The catheter was removed. Because of coagulopathy, sheath was secured with 0 silk suture and placed to flush system. The patient tolerated the procedure well. 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 Technically successful coil embolization of the splenic artery immediately distal and proximal to the lesion. IMPRESSION: 1. 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 Electronically Signed   By: Corlis Leak M.D.   On: 01/15/2020 09:49       Assessment / Plan:    69 year old female with a complicated history of chronic alcoholic pancreatitis with pancreatic strictures, she has undergone complex EUS guided pancreaticogastrostomy with Dr. Jamse Mead at [facility] for treatment of this initially a few months ago.  Most recently on April 8 had a clogged pancreatic stent removed and was replaced after dilation of the pancreatic duct. < 48 hrs later presented with a massive upper GI bleed. Endoscopy yesterday showed active arterial bleeding and significant blood / clot burden with poor visualization throughout the entire stomach and duodenum. Sent urgently for CT angio and IR embolization of mid splenic artery pseudoaneurysm which was actively bleeding.  Hgb has been stable post massive transfusion protocol which is reassuring so far. She has passed some old blood per rectum. Is at risk for rebleeding from this. Not much to add at this point, hopefully she continues to improve with supportive  care.  Of note, [facility] is aware of this patient, they were contacted twice on admission and declined the patient due to no bed availability, will contact them this week to update them on her status.   Will follow peripherally for now, please call with questions or changes in her status in the interim. She will follow up with Dr. Leone Payor of our clinic and Dr. Edyth Gunnels of [facility].   Ileene Patrick, MD [facility]

## 2020-01-16 ENCOUNTER — Inpatient Hospital Stay (HOSPITAL_COMMUNITY): Payer: Medicare Other

## 2020-01-16 DIAGNOSIS — N179 Acute kidney failure, unspecified: Secondary | ICD-10-CM | POA: Diagnosis not present

## 2020-01-16 DIAGNOSIS — R4182 Altered mental status, unspecified: Secondary | ICD-10-CM | POA: Diagnosis not present

## 2020-01-16 DIAGNOSIS — J9601 Acute respiratory failure with hypoxia: Secondary | ICD-10-CM | POA: Diagnosis not present

## 2020-01-16 DIAGNOSIS — K859 Acute pancreatitis without necrosis or infection, unspecified: Secondary | ICD-10-CM | POA: Diagnosis not present

## 2020-01-16 DIAGNOSIS — R578 Other shock: Secondary | ICD-10-CM | POA: Diagnosis not present

## 2020-01-16 DIAGNOSIS — I729 Aneurysm of unspecified site: Secondary | ICD-10-CM

## 2020-01-16 DIAGNOSIS — K861 Other chronic pancreatitis: Secondary | ICD-10-CM | POA: Diagnosis not present

## 2020-01-16 LAB — POCT I-STAT 7, (LYTES, BLD GAS, ICA,H+H)
Acid-Base Excess: 5 mmol/L — ABNORMAL HIGH (ref 0.0–2.0)
Bicarbonate: 28.2 mmol/L — ABNORMAL HIGH (ref 20.0–28.0)
Calcium, Ion: 1.01 mmol/L — ABNORMAL LOW (ref 1.15–1.40)
HCT: 34 % — ABNORMAL LOW (ref 36.0–46.0)
Hemoglobin: 11.6 g/dL — ABNORMAL LOW (ref 12.0–15.0)
O2 Saturation: 89 %
Patient temperature: 96.6
Potassium: 5.2 mmol/L — ABNORMAL HIGH (ref 3.5–5.1)
Sodium: 144 mmol/L (ref 135–145)
TCO2: 29 mmol/L (ref 22–32)
pCO2 arterial: 35.3 mmHg (ref 32.0–48.0)
pH, Arterial: 7.506 — ABNORMAL HIGH (ref 7.350–7.450)
pO2, Arterial: 48 mmHg — ABNORMAL LOW (ref 83.0–108.0)

## 2020-01-16 LAB — TYPE AND SCREEN
ABO/RH(D): A NEG
Antibody Screen: NEGATIVE
Unit division: 0
Unit division: 0
Unit division: 0
Unit division: 0
Unit division: 0
Unit division: 0
Unit division: 0
Unit division: 0
Unit division: 0
Unit division: 0

## 2020-01-16 LAB — URINE CULTURE
Culture: >=100000 — AB
Culture: >=100000 — AB
Special Requests: NORMAL

## 2020-01-16 LAB — BPAM RBC
Blood Product Expiration Date: 202104232359
Blood Product Expiration Date: 202104232359
Blood Product Expiration Date: 202104252359
Blood Product Expiration Date: 202104272359
Blood Product Expiration Date: 202104272359
Blood Product Expiration Date: 202104272359
Blood Product Expiration Date: 202104272359
Blood Product Expiration Date: 202104280934
Blood Product Expiration Date: 202105162359
Blood Product Expiration Date: 202105162359
ISSUE DATE / TIME: 202104101105
ISSUE DATE / TIME: 202104101105
ISSUE DATE / TIME: 202104101105
ISSUE DATE / TIME: 202104101105
ISSUE DATE / TIME: 202104101300
ISSUE DATE / TIME: 202104101300
ISSUE DATE / TIME: 202104101351
ISSUE DATE / TIME: 202104101351
ISSUE DATE / TIME: 202104101351
ISSUE DATE / TIME: 202104101351
Unit Type and Rh: 5100
Unit Type and Rh: 5100
Unit Type and Rh: 5100
Unit Type and Rh: 5100
Unit Type and Rh: 600
Unit Type and Rh: 600
Unit Type and Rh: 600
Unit Type and Rh: 600
Unit Type and Rh: 600
Unit Type and Rh: 600

## 2020-01-16 LAB — GLUCOSE, CAPILLARY
Glucose-Capillary: 103 mg/dL — ABNORMAL HIGH (ref 70–99)
Glucose-Capillary: 135 mg/dL — ABNORMAL HIGH (ref 70–99)
Glucose-Capillary: 68 mg/dL — ABNORMAL LOW (ref 70–99)
Glucose-Capillary: 69 mg/dL — ABNORMAL LOW (ref 70–99)
Glucose-Capillary: 80 mg/dL (ref 70–99)
Glucose-Capillary: 81 mg/dL (ref 70–99)
Glucose-Capillary: 84 mg/dL (ref 70–99)
Glucose-Capillary: 94 mg/dL (ref 70–99)
Glucose-Capillary: 95 mg/dL (ref 70–99)

## 2020-01-16 LAB — COMPREHENSIVE METABOLIC PANEL
ALT: 658 U/L — ABNORMAL HIGH (ref 0–44)
AST: 1473 U/L — ABNORMAL HIGH (ref 15–41)
Albumin: 1.9 g/dL — ABNORMAL LOW (ref 3.5–5.0)
Alkaline Phosphatase: 45 U/L (ref 38–126)
Anion gap: 13 (ref 5–15)
BUN: 48 mg/dL — ABNORMAL HIGH (ref 8–23)
CO2: 27 mmol/L (ref 22–32)
Calcium: 7.5 mg/dL — ABNORMAL LOW (ref 8.9–10.3)
Chloride: 107 mmol/L (ref 98–111)
Creatinine, Ser: 3.04 mg/dL — ABNORMAL HIGH (ref 0.44–1.00)
GFR calc Af Amer: 18 mL/min — ABNORMAL LOW (ref >60)
GFR calc non Af Amer: 15 mL/min — ABNORMAL LOW (ref >60)
Glucose, Bld: 159 mg/dL — ABNORMAL HIGH (ref 70–99)
Potassium: 5.1 mmol/L (ref 3.5–5.1)
Sodium: 147 mmol/L — ABNORMAL HIGH (ref 135–145)
Total Bilirubin: 0.9 mg/dL (ref 0.3–1.2)
Total Protein: 4 g/dL — ABNORMAL LOW (ref 6.5–8.1)

## 2020-01-16 LAB — BASIC METABOLIC PANEL
Anion gap: 13 (ref 5–15)
BUN: 53 mg/dL — ABNORMAL HIGH (ref 8–23)
CO2: 27 mmol/L (ref 22–32)
Calcium: 7.1 mg/dL — ABNORMAL LOW (ref 8.9–10.3)
Chloride: 107 mmol/L (ref 98–111)
Creatinine, Ser: 3.22 mg/dL — ABNORMAL HIGH (ref 0.44–1.00)
GFR calc Af Amer: 17 mL/min — ABNORMAL LOW (ref >60)
GFR calc non Af Amer: 14 mL/min — ABNORMAL LOW (ref >60)
Glucose, Bld: 171 mg/dL — ABNORMAL HIGH (ref 70–99)
Potassium: 5.1 mmol/L (ref 3.5–5.1)
Sodium: 147 mmol/L — ABNORMAL HIGH (ref 135–145)

## 2020-01-16 LAB — CREATININE, URINE, RANDOM: Creatinine, Urine: 28.85 mg/dL

## 2020-01-16 LAB — HEMOGLOBIN AND HEMATOCRIT, BLOOD
HCT: 34.8 % — ABNORMAL LOW (ref 36.0–46.0)
HCT: 33.8 % — ABNORMAL LOW (ref 36.0–46.0)
Hemoglobin: 12.4 g/dL (ref 12.0–15.0)
Hemoglobin: 11.7 g/dL — ABNORMAL LOW (ref 12.0–15.0)

## 2020-01-16 LAB — CBC
HCT: 33.9 % — ABNORMAL LOW (ref 36.0–46.0)
Hemoglobin: 12 g/dL (ref 12.0–15.0)
MCH: 30.7 pg (ref 26.0–34.0)
MCHC: 35.4 g/dL (ref 30.0–36.0)
MCV: 86.7 fL (ref 80.0–100.0)
Platelets: 57 10*3/uL — ABNORMAL LOW (ref 150–400)
RBC: 3.91 MIL/uL (ref 3.87–5.11)
RDW: 15.4 % (ref 11.5–15.5)
WBC: 39.3 10*3/uL — ABNORMAL HIGH (ref 4.0–10.5)
nRBC: 0.8 % — ABNORMAL HIGH (ref 0.0–0.2)

## 2020-01-16 LAB — AMMONIA: Ammonia: 35 umol/L (ref 9–35)

## 2020-01-16 LAB — HEMOGLOBIN A1C
Hgb A1c MFr Bld: 5.4 % (ref 4.8–5.6)
Mean Plasma Glucose: 108 mg/dL

## 2020-01-16 LAB — SODIUM, URINE, RANDOM: Sodium, Ur: 31 mmol/L

## 2020-01-16 MED ORDER — DEXTROSE 10 % IV SOLN: INTRAVENOUS | Status: DC

## 2020-01-16 MED ORDER — FENTANYL CITRATE (PF) 100 MCG/2ML IJ SOLN
25.0000 ug | INTRAMUSCULAR | Status: DC | PRN
  Administered 2020-01-16: 100 ug via INTRAVENOUS
  Administered 2020-01-17: 14:00:00 50 ug via INTRAVENOUS
  Administered 2020-01-18 (×6): 100 ug via INTRAVENOUS
  Filled 2020-01-16 (×8): qty 2

## 2020-01-16 MED ORDER — FENTANYL CITRATE (PF) 100 MCG/2ML IJ SOLN
25.0000 ug | INTRAMUSCULAR | Status: DC | PRN
  Administered 2020-01-16: 25 ug via INTRAVENOUS
  Filled 2020-01-16 (×2): qty 2

## 2020-01-16 MED ORDER — SODIUM CHLORIDE 0.9 % IV SOLN
1.0000 g | Freq: Three times a day (TID) | INTRAVENOUS | Status: DC
  Administered 2020-01-16 – 2020-01-18 (×6): 1 g via INTRAVENOUS
  Filled 2020-01-16 (×8): qty 1000

## 2020-01-16 MED ORDER — DEXTROSE 50 % IV SOLN
INTRAVENOUS | Status: AC
  Filled 2020-01-16: qty 50

## 2020-01-16 MED ORDER — FUROSEMIDE 10 MG/ML IJ SOLN
80.0000 mg | Freq: Once | INTRAMUSCULAR | Status: AC
  Administered 2020-01-16: 80 mg via INTRAVENOUS
  Filled 2020-01-16: qty 8

## 2020-01-16 MED ORDER — MIDAZOLAM HCL 2 MG/2ML IJ SOLN
INTRAMUSCULAR | Status: AC
  Administered 2020-01-16: 15:00:00 2 mg
  Filled 2020-01-16: qty 4

## 2020-01-16 MED ORDER — POLYETHYLENE GLYCOL 3350 17 G PO PACK: 17.0000 g | PACK | Freq: Every day | ORAL | Status: DC

## 2020-01-16 MED ORDER — FENTANYL CITRATE (PF) 100 MCG/2ML IJ SOLN
INTRAMUSCULAR | Status: AC
  Administered 2020-01-16: 100 ug
  Filled 2020-01-16: qty 2

## 2020-01-16 MED ORDER — ETOMIDATE 2 MG/ML IV SOLN
INTRAVENOUS | Status: AC
  Administered 2020-01-16: 15:00:00 20 mg
  Filled 2020-01-16: qty 20

## 2020-01-16 MED ORDER — DEXMEDETOMIDINE HCL IN NACL 400 MCG/100ML IV SOLN
0.0000 ug/kg/h | INTRAVENOUS | Status: DC
  Administered 2020-01-16: 0.4 ug/kg/h via INTRAVENOUS
  Administered 2020-01-16: 19:00:00 0.6 ug/kg/h via INTRAVENOUS
  Administered 2020-01-17: 0.4 ug/kg/h via INTRAVENOUS
  Administered 2020-01-17: 1.2 ug/kg/h via INTRAVENOUS
  Administered 2020-01-17: 11:00:00 1 ug/kg/h via INTRAVENOUS
  Administered 2020-01-18 – 2020-01-19 (×6): 1.2 ug/kg/h via INTRAVENOUS
  Administered 2020-01-20: 02:00:00 1 ug/kg/h via INTRAVENOUS
  Filled 2020-01-16 (×12): qty 100

## 2020-01-16 MED ORDER — ROCURONIUM BROMIDE 10 MG/ML (PF) SYRINGE
PREFILLED_SYRINGE | INTRAVENOUS | Status: AC
  Filled 2020-01-16: qty 10

## 2020-01-16 MED ORDER — SODIUM CHLORIDE 0.9 % IV BOLUS
1000.0000 mL | Freq: Once | INTRAVENOUS | Status: AC
  Administered 2020-01-17: 1000 mL via INTRAVENOUS

## 2020-01-16 MED ORDER — DOCUSATE SODIUM 50 MG/5ML PO LIQD: 100.0000 mg | Freq: Two times a day (BID) | ORAL | Status: DC

## 2020-01-16 MED ORDER — SODIUM CHLORIDE 0.9 % IV BOLUS
500.0000 mL | Freq: Once | INTRAVENOUS | Status: AC
  Administered 2020-01-16: 15:00:00 500 mL via INTRAVENOUS

## 2020-01-16 NOTE — Progress Notes (Addendum)
NAME:  Natasha Jacobson, MRN:  [id number], DOB:  02-12-1951, LOS: 3 ADMISSION DATE:  01/13/2020, CONSULTATION DATE:  01/14/20 REFERRING MD:  Adela Lank, CHIEF COMPLAINT:  AMS   Brief History   69 year old woman with a hx of alcoholic pancreatitis s/p recent exchange of pancreaticogastrostomy stent at [facility] (4/8) who presented to the ED on 4/9 with hematemesis. On 4/10 she developed hematochezia, encephalopathy, and hypotension. She was transferred to the ICU and taken for emergent EGD that illustrated active upper GI hemorrhage with inadequate visualization for source control. 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 She was subsequently take for selective splenic arteriogram and coiled embolization of the pseudoaneurysm (4/10).   Since embolization she is now hemodynamically stable off pressor support and hemoglobin has stabilized.   Past Medical History  Alcohol use disorder, chronic pancreatitis, COPD, CAD, HLD, HTN, MI, sick sinus syndrome  Significant Hospital Events   4/9 admitted; mass transfusion protocol 4/10 transfer to ICU; EGD, IR embolization 4/11 extubated 4/12 white count remains elevated at 39k, LFTs significantly elevated; continues to have dark red stools; hgb stable; minimal responsiveness  Consults:  GI IR Procedures:  4/10 Intubation>>4/11 4/10 central line>>   Significant Diagnostic Tests:  CT A/P 01/13/20>>Mild peripancreatic haziness may represent acute on chronic pancreatitis. A pancreatic stent with pigtail ends in the second portion of the duodenum and in the body of the stomach noted. No peripancreatic fluid collection or pseudocyst. Bilateral renal parenchyma wedge-shaped infarcts, new since the prior CT.   4/9 CTA a/p>>22mm pseudoaneurysm over a pancreatic branch of the celiac. Evidence of acute hemorrhage.  -Patient  bilateral renal arteries with focal narrowing of the proximal 1cm of right renal artery. -interval distension of the stomach/duodenum likely 2/2 hemorrhage. Dilated proximal small bowel loops. Possible penumatosis involving descending colon  -worsening patchy bilateral low attenuation areas over the renal cortex which may be due to infarction -worsening free peritoneal fluid  Micro Data:  4/9 Urine culture>> ++ e.faecalis 4/10 urine culture>> 4/9 COVID Neg  Antimicrobials:  4/9 Zosyn x 1 4/10 Ceftriaxone >> 4/12 Ampicillin 4/12>>  Interim history/subjective:  Blood pressures remained somewhat soft overnight however not requiring pressors. Had 3 BMs overnight that were dark red. Minimally responsive this morning however does open eyes and look to you when you are talking.  Objective   Blood pressure (!) 105/54, pulse 82, temperature (!) 95.4 F (35.2 C), resp. rate (!) 33, height 5\' 4"  (1.626 m), weight 50.7 kg, SpO2 99 %.    Vent Mode: CPAP;PSV FiO2 (%):  [40 %] 40 % Set Rate:  [15 bmp] 15 bmp Vt Set:  [440 mL] 440 mL PEEP:  [5 cmH20] 5 cmH20 Pressure Support:  [10 cmH20] 10 cmH20 Plateau Pressure:  [12 cmH20] 12 cmH20   Intake/Output Summary (Last 24 hours) at 01/16/2020 0602 Last data filed at 01/16/2020 0400 Gross per 24 hour  Intake 3410.3 ml  Output 1010 ml  Net 2400.3 ml   Filed Weights   01/14/20 0108  Weight: 50.7 kg   Examination: General: critically ill appearing female HENT: on 6L [place]. Minimal bright red blood present in suction tubing Pulm: breathing comfortably on 6L [place]. Upper respiratatory sounds wet. expriatory wheezes present bilaterally L>R CV: tachycardic rate. No peripheral edema Abdomen: abd distended and firm. Pain with palpation of all quadrants. Bed pad present with  dark red blood Extremities: warm with bear hugger on Skin: no rash Neuro: awakens to voice. Does not respond to questions. Does not follow commands. PERRL.  Resolved Hospital Problem  list   N/A  Assessment & Plan:   Hemorrhagic shock 2/2 bleeding 7mm pseudoaneurysm and recent instrumentation s/p EGD and coil embolization by IR 4/10 Acute blood loss anemia Hemoglobin remains stable but continues to have BRBPR.  Pressures remained somewhat soft but stable overnight. Off pressors Suspect bloody bowel movements are residual Plan: --continue 40mg  protonix bid --continue to monitor hgb   Acute hypoxic respiratory failure  Extubated 4/11; increasing supplemental O2 requirement since yesterday--up to 6L this morning. Mental status is slightly concerning for ability to protect her airway and will need to monitor closely for need for repeat intubation ABG indicated metabolic alkalosis with concomitant respiratory alkalosis.  pulm edema vs pna vs PE.  Given her elevated white count, cannot rule out pna. Can not r/o PE; high risk given her low albumin levels and acute vs chronic illness. Does not appear hypervolemic on exam however pulm edema possible as well. Can not r/o PE; high risk given her low albumin levels and acute vs chronic illness Plan: will monitor closely. Will obtain a CXR to evaluate for pulm edema vs pna.   AKI. Slight worsening from yesterday. CT findings from admission neg for abscess however did indicate some possible areas of infarcts. RBCs present on admission UA. FENA 2.8 indicating likely intrarenal pathology. ATN could be considered with her hypotension 2/2 shock. Hypernatremia Hyperkalemia--5.1 this morning. Had d5 1/2 NS with K running this morning Plan D/c D5 1/2 NS with K. Will start her on D10 which may also help with the hypernatremia  E. Faecalis urinary tract infection. Febrile on admission.  Afebrile overnight. Suspect hypotension to be related to blood loss rather than sepsis.  Plan: will transition to ampicillin--day #1  Acute encephalopathy. Metabolic most likely however can not rule out CNS or infection. Propofol turned off yesterday  however may still be contributing as well. Ammonia obtained today is normal. Plan: delirium precautions. Avoid sedation medications. Continue to monitor.    Leukocytosis. Unclear etiology. White count significantly increased from admission 18>>39k. She did spike a temp to 101.8 on 4/10 however has become progressively hypothermic since then. Would not suspect UTI or acute illness to be solely responsible. The GI bleed may have resulted in some reactive leukocytosis but I would not suspect it to have increased to 39k with this either. Plan: Obtaining CXR to evaluate for pneumonia. Will continue to monitor.  Thrombocytopenia. Significant drop since admission 293>57. Transaminitis. Significantly increased from admission. Bili normal. Considered possibly from IR embolization however would not suspect this should cause such an increase. No prior history of liver cirhosis and not commented on in the admission CTA. Right now, highest suspicion would be from shock liver however will need to monitor closely and consider obtaining coag labs if now improvement.  Hypothyroidism. Continue synthroid.  Best practice:  Diet: NPO Pain/Anxiety/Delirium protocol (if indicated): PRNs VAP protocol (if indicated): n/a DVT prophylaxis: SCDs GI prophylaxis: PPI bid Glucose control: SSI Mobility: BR Code Status: Full Family Communication: per request Disposition: ICU  Labs   CBC: Recent Labs  Lab 01/13/20 1705 01/13/20 1705 01/14/20 1042 01/14/20 1348 01/15/20 0401 01/15/20 0833 01/15/20 1013 01/15/20 1729 01/15/20 2156  WBC 17.9*  --  38.0*  --   --   --   --   --   --   NEUTROABS  13.6*  --  29.4*  --   --   --   --   --   --   HGB 8.4*   < > 3.9*   < > 12.3 11.6* 13.3 12.3 11.8*  HCT 26.3*   < > 13.6*   < > 34.3* 34.0* 36.2 33.9* 33.0*  MCV 94.3  --  111.5*  --   --   --   --   --   --   PLT 293  --  189  --   --   --   --   --   --    < > = values in this interval not displayed.    Basic  Metabolic Panel: Recent Labs  Lab 01/13/20 1705 01/13/20 1705 01/14/20 1042 01/14/20 1348 01/14/20 1944 01/14/20 2140 01/15/20 0833 01/15/20 0837  NA 137   < > 145 141 144  --  146* 148*  K 4.3   < > 4.8 4.9 3.3*  --  2.8* 2.9*  CL 105  --  113*  --   --   --   --  102  CO2 21*  --  <7*  --   --   --   --  30  GLUCOSE 112*  --  56*  --   --   --   --  134*  BUN 32*  --  37*  --   --   --   --  39*  CREATININE 1.68*  --  2.38*  --   --   --   --  2.66*  CALCIUM 8.3*  --  7.7*  --   --   --   --  7.5*  MG  --   --   --   --   --  1.7  --   --    < > = values in this interval not displayed.   GFR: Estimated Creatinine Clearance: 16.7 mL/min (A) (by C-G formula based on SCr of 2.66 mg/dL (H)). Recent Labs  Lab 01/13/20 1705 01/14/20 1042  WBC 17.9* 38.0*    Liver Function Tests: Recent Labs  Lab 01/13/20 1705 01/14/20 1042  AST 21 214*  ALT 10 124*  ALKPHOS 39 50  BILITOT 0.4 0.3  PROT 5.4* 3.1*  ALBUMIN 2.8* 1.6*   Recent Labs  Lab 01/13/20 1705  LIPASE 256*   No results for input(s): AMMONIA in the last 168 hours.  ABG    Component Value Date/Time   PHART 7.598 (H) 01/15/2020 0833   PCO2ART 34.9 01/15/2020 0833   PO2ART 63.0 (L) 01/15/2020 0833   HCO3 33.9 (H) 01/15/2020 0833   TCO2 35 (H) 01/15/2020 0833   ACIDBASEDEF 7.0 (H) 01/14/2020 1944   O2SAT 94.0 01/15/2020 0833     Coagulation Profile: Recent Labs  Lab 01/14/20 1042 01/15/20 1013  INR 3.5* 1.5*    Cardiac Enzymes: No results for input(s): CKTOTAL, CKMB, CKMBINDEX, TROPONINI in the last 168 hours.  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   Surgical History    Past Surgical History:  Procedure Laterality Date  . ABDOMINAL HYSTERECTOMY    . APPENDECTOMY    . ERCP     multiple with stents placed   . IR ANGIOGRAM SELECTIVE EACH ADDITIONAL VESSEL  01/15/2020  . IR ANGIOGRAM VISCERAL SELECTIVE  01/15/2020  . IR EMBO ART  VEN HEMORR LYMPH EXTRAV  INC GUIDE ROADMAPPING  01/15/2020  . IR US GUIDE VASC ACCESS RIGHT  01/15/2020  . PACEMAKER IMPLANT    . TONSILLECTOMY       Social History   reports that she has been smoking cigarettes. She has never used smokeless tobacco. She reports previous alcohol use. She reports previous drug use.   Family History   Her family history includes Emphysema in her mother; Heart attack in her brother; Heart block in her father; Lung cancer in her mother; Thyroid disease in her daughter, granddaughter, and mother.   Allergies Allergies  Allergen Reactions  . Buprenorphine Nausea Only  . Aspirin Nausea Only    GI upset GI upset      Home Medications  Prior to Admission medications   Medication Sig Start Date End Date Taking? Authorizing Provider  acetaminophen (TYLENOL) 500 MG tablet Take 2 tablets by mouth every 8 (eight) hours as needed. 11/01/19   [provider]  albuterol (VENTOLIN HFA) 108 (90 Base) MCG/ACT inhaler Inhale 2 puffs into the lungs every 4 (four) hours as needed. 11/24/18   [provider]  amLODipine (NORVASC) 5 MG tablet Take 1 tablet by mouth daily. 09/22/19   [provider]  baclofen (LIORESAL) 10 MG tablet Take 1 tablet by mouth daily as needed. 11/11/19   [provider]  ergocalciferol (VITAMIN D2) 1.25 MG (50000 UT) capsule Take 1  capsule by mouth once a week. 11/18/19   [provider]  fenofibrate (TRICOR) 48 MG tablet Take 1 tablet by mouth daily. 05/13/18   [provider]  gabapentin (NEURONTIN) 300 MG capsule Take 1 capsule by mouth 3 (three) times daily. 11/11/19 11/10/20  [provider]  levothyroxine (SYNTHROID) 50 MCG tablet Take 50 mcg by mouth daily before breakfast.    [provider]  Melatonin 5 MG CAPS Take 2 capsules by mouth at bedtime.    [provider]  Pancrelipase, Lip-Prot-Amyl, (CREON) 24000-76000 units CPEP Take 1 capsule by mouth 3 (three) times daily. 08/18/19   [provider]  pantoprazole (PROTONIX) 40 MG tablet Take 40 mg by mouth daily.    [provider]  polyethylene glycol powder (GLYCOLAX/MIRALAX) 17 GM/SCOOP powder Take 17 g by mouth as needed.    [provider]  senna (SENOKOT) 8.6 MG TABS tablet Take 1 tablet by mouth as needed for mild constipation.    [provider]  Tiotropium Bromide Monohydrate (SPIRIVA RESPIMAT) 1.25 MCG/ACT AERS Inhale 2 puffs into the lungs daily. 11/15/19 11/14/20  [provider]          Independently examined pt, evaluated data & formulated above care plan with resident  Surgery, GI and IR notes reviewed  Chronic alcoholic pancreatitis with strictures status post pancreatic stent replacement on 4/8 at [facility] Admitted with upper GI bleed and hemorrhagic shock, now status post embolization of splenic artery pseudoaneurysm  She is now off pressors and hemoglobin has been stable last 24 hours although dark red blood PR continues, 3 sets bowel  movements last 12 hours. She is encephalopathic and more hypoxic up to 6 L this morning On exam-does not follow commands, decreased breath sounds bilateral, shallow respirations, S1-S2 regular, soft diffusely tender abdomen, no guarding or rigidity  1 L urine output last 24 hours Labs show increasing creatinine to 3.0, mild hyperkalemia  5.1, elevated LFTs, extreme leukocytosis that has been present since 4/10, prior to embolization.  Chest x-ray personally reviewed which shows bilateral interstitial infiltrates, no effusions   Impression/plan  Hemorrhagic shock appears to have resolved, continue IV Protonix 40 twice daily, monitor hemoglobin every 6 hours  Acute respiratory failure with hypoxia -likely pulmonary edema versus aspiration, trial of Lasix as well AKI -Fina indicates ATN, left Lasix  Acute encephalopathy-related to above, check ammonia for completion, hold off on lactulose for now  Enterococcus faecalis UTI-changed to ampicillin, extreme leukocytosis probably a combination of UTI and shock  The patient is critically ill with multiple organ systems failure and requires high complexity decision making for assessment and support, frequent evaluation and titration of therapies, application of advanced monitoring technologies and extensive interpretation of multiple databases. Critical Care Time devoted to patient care services described in this note independent of APP/resident  time is 33 minutes.   Leanna Sato Elsworth Soho MD

## 2020-01-16 NOTE — H&P (View-Only) (Signed)
Gastroenterology Inpatient Follow-up Note   PATIENT IDENTIFICATION  Natasha Jacobson is a 69 y.o. female with a pmh significant for chronic alcoholic pancreatitis (status post interventions by advanced endoscopy [facility]), chronic pain syndrome, COPD, hypertension, hypothyroidism, prediabetes, thyroid disease.  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 Hospital Day: 4  SUBJECTIVE  The patient was evaluated on rounds this morning.  She remains hemodynamically stable.  Hemoglobin is stable.  However, her mental status is such that she only responds to pain.  She has had some continued output from her rectum suggestive of melena.  However hemoglobin is in the 12 range. Etiology for altered mental status is unclear at this time. Patient unable to give me further history today.     OBJECTIVE  Scheduled Inpatient Medications:  . sodium chloride   Intravenous Once  . sodium chloride   Intravenous Once  . sodium chloride   Intravenous Once  . Chlorhexidine Gluconate Cloth  6 each Topical Daily  . insulin aspart  0-6 Units Subcutaneous Q4H  . levothyroxine  25 mcg Intravenous Daily  . mouth rinse  15 mL Mouth Rinse BID  . pantoprazole (PROTONIX) IV  40 mg Intravenous Q12H   Continuous Inpatient Infusions:  . sodium chloride 999 mL/hr at 01/14/20 1058  . ampicillin (OMNIPEN) IV Stopped (01/16/20 1128)  . dextrose 30 mL/hr at 01/16/20 1200   PRN Inpatient Medications: sodium chloride, albuterol, hydrALAZINE, ondansetron **OR** ondansetron (ZOFRAN) IV   Physical Examination  Temp:  [94.1 F (34.5 C)-99.1 F (37.3 C)] 99.1 F (37.3 C) (04/12 1200) Pulse Rate:  [72-100] 100 (04/12 1200) Resp:  [15-45] 45 (04/12 1200) BP: (78-140)/(44-72) 137/72 (04/12 1200) SpO2:  [87 %-99 %] 94 % (04/12 1200) Arterial Line BP: (91-176)/(44-68) 128/62 (04/12 0600) Temp (24hrs), Avg:96.4 F (35.8 C),  Min:94.1 F (34.5 C), Max:99.1 F (37.3 C)  Weight: 50.7 kg GEN: Chronically ill-appearing, nontoxic today PSYCH: Although cooperative, she is not answering any questions EYE: Conjunctivae pink, sclerae anicteric ENT: NG tube in place, moist mucous membranes CV: Tachycardic RESP: No wheezing present GI: Protuberant abdomen, this to palpation with the patient describing pain vomiting patient throughout the abdomen, no rebound GU: DRE not performed but melena noted MSK/EXT: Lower extremity edema present SKIN: No jaundice NEURO: Her orientation and alertness is unclear, no evidence of clonus of the upper or lower extremities   Review of Data   Laboratory Studies   Recent Labs  Lab 01/14/20 2140 01/15/20 0833 01/16/20 0504 01/16/20 0504 01/16/20 1030  NA  --    < > 147*   < > 144  K  --    < > 5.1   < > 5.2*  CL  --    < > 107  --   --   CO2  --    < > 27  --   --   BUN  --    < > 48*  --   --   CREATININE  --    < > 3.04*  --   --   GLUCOSE  --    < > 159*  --   --   CALCIUM  --    < > 7.5*  --   --   MG 1.7  --   --   --   --    < > = values in this interval not displayed.  Recent Labs  Lab 01/16/20 0504  AST 1,473*  ALT 658*  ALKPHOS 45    Recent Labs  Lab 01/13/20 1705 01/13/20 1705 01/14/20 1042 01/14/20 1348 01/16/20 0554 01/16/20 1023 01/16/20 1030  WBC 17.9*   < > 38.0*   < > 39.3*  --   --   HGB 8.4*   < > 3.9*   < > 12.0   < > 11.6*  HCT 26.3*   < > 13.6*   < > 33.9*   < > 34.0*  PLT 293  --  189  --  57*  --   --    < > = values in this interval not displayed.   Recent Labs  Lab 01/14/20 1042 01/15/20 1013  INR 3.5* 1.5*   Imaging Studies  No new relevant studies to review  GI Procedures and Studies  No new relevant studies to review   ASSESSMENT  Ms. Odor is a 69 y.o. female with a pmh significant for chronic alcoholic pancreatitis (status post interventions by advanced endoscopy [facility]), chronic pain syndrome, COPD, hypertension,  hypothyroidism, prediabetes, thyroid disease.  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  The patient has been stable overall in regards to her recent splenic pseudoaneurysm status post embolization.  Hemoglobin this morning of 12.  She does have some evidence of melena, but I would expect this to last for a few days in the setting of her progressive and severe episode of bleeding.  Patient is clinical status was updated to her primary gastroenterologist, Dr. Corliss Parish at [facility].  I personally spoke with him in the advanced endoscopy team at [facility] this morning.  He has been updated about the clinical status.  He describes no desire of removing this current stent is in place.  As long as the patient is hemodynamically stable, the patient can be discharged and a follow-up will be set.  If the patient is hemodynamically stable and able to be discharged, then we will need to reach out to Sherrie Mustache ([phone]) to work on scheduling follow-up.  If however, the patient is not hemodynamically stable to be discharged, then the medical service will need to reach out to [facility] to have the patient transferred.  The etiology of her liver test abnormalities most likely is in association with her significant dynamic compromise from over the weekend.  We will check her INR tomorrow.  I suspect this is ischemic hepatopathy but we will need to monitor her liver tests closely tomorrow.  The etiology of her mental status change is not clear.  She is developing a bit of a progressive renal insufficiency which I suspect could be a result of the contrast nephropathy as well as ATN.  If the patient's mental status does not improve, may be reasonable to have nephrology weigh in as to whether renal replacement therapy would be beneficial and whether her mental status issues could be uremia induced although her current urea is lower  than I would expect for him to be causing uremic encephalopathy.  Will allow patient service to further evaluate and consider etiologies for altered mental status.  Certainly given lactulose although her ammonia level is normal but very little downside to the use of the if desired.  She remains high risk with a guarded prognosis.  We will see how she does in the next few days and whether any transfer to [facility]  may still be required but from the standpoint of bleeding as of this morning, she does not need a transfer.   PLAN/RECOMMENDATIONS  Daily hepatic function panel and INR Every 8 hour every 12 hour CBC Maintain IV PPI for now Consider lactulose although not clear that this is PSE mental status issues Appreciate medical service further evaluating other etiologies for mental status ages Appreciate medical service monitoring any function.  Nephrology consultation dependent on progression of injury Hepatitis C most likely will monitor and do additional work-up as needed [facility] dated on 4/12 has been patient's clinical stability in regards to her pseudoaneurysm with no plan for stent extraction at this time if she remains stable and no need for transfer per their report if she is hemodynamically stable Would consider [facility] transfer if the patient remains unstable for further medical issues prevented safe discharge (Contact at time of discharge Shellee Milo - [phone]) She will need to have a stable hemoglobin for days before she could be discharged safely   Please page/call with questions or concerns.   Justice Britain, MD [facility] Office # [phone]    LOS: 3 days  Irving Copas  01/16/2020, 2:11 PM

## 2020-01-16 NOTE — Progress Notes (Signed)
2 Days Post-Op   Subjective/Chief Complaint: Pt with no acute changes    Objective: Vital signs in last 24 hours: Temp:  [94.1 F (34.5 C)-100.6 F (38.1 C)] 96.4 F (35.8 C) (04/12 0700) Pulse Rate:  [72-96] 81 (04/12 0700) Resp:  [15-36] 36 (04/12 0700) BP: (65-140)/(45-70) 126/55 (04/12 0700) SpO2:  [87 %-100 %] 96 % (04/12 0700) Arterial Line BP: (91-176)/(44-68) 128/62 (04/12 0600) FiO2 (%):  [40 %] 40 % (04/11 1110) Last BM Date: 01/15/20  Intake/Output from previous day: 04/11 0701 - 04/12 0700 In: 3643.2 [I.V.:2965.4; IV Piggyback:677.8] Out: 1085 [Urine:1085] Intake/Output this shift: No intake/output data recorded.  Constitutional: No acute distress, sedated, appears states age. Eyes: Anicteric sclerae, moist conjunctiva, no lid lag Lungs: Clear to auscultation bilaterally, normal respiratory effort CV: regular rate and rhythm, no murmurs, no peripheral edema, pedal pulses 2+ GI: Soft, no masses or hepatosplenomegaly, non-tender to palpation Skin: No rashes, palpation reveals normal turgor Psychiatric: appropriate judgment and insight, oriented to person, place, and time    Lab Results:  Recent Labs    01/14/20 1042 01/14/20 1348 01/15/20 2156 01/16/20 0554  WBC 38.0*  --   --  39.3*  HGB 3.9*   < > 11.8* 12.0  HCT 13.6*   < > 33.0* 33.9*  PLT 189  --   --  57*   < > = values in this interval not displayed.   BMET Recent Labs    01/15/20 0837 01/16/20 0504  NA 148* 147*  K 2.9* 5.1  CL 102 107  CO2 30 27  GLUCOSE 134* 159*  BUN 39* 48*  CREATININE 2.66* 3.04*  CALCIUM 7.5* 7.5*   PT/INR Recent Labs    01/14/20 1042 01/15/20 1013  LABPROT 34.9* 18.2*  INR 3.5* 1.5*   ABG Recent Labs    01/14/20 1944 01/15/20 0833  PHART 7.433 7.598*  HCO3 17.0* 33.9*    Studies/Results: CT ANGIO ABDOMEN W &/OR WO CONTRAST  Addendum Date: 01/14/2020   ADDENDUM REPORT: 01/14/2020 16:45 ADDENDUM: PRA, Bing Neighbors, states that Dr. Deanne Coffer has  viewed the CT scan and is aware of the findings. Dr. Deanne Coffer is currently performing a procedure on this patient. Electronically Signed   By: Elberta Fortis M.D.   On: 01/14/2020 16:45   Result Date: 01/14/2020 CLINICAL DATA:  GI bleed. Recent abdominal distention with nausea and vomiting. History of chronic pancreatitis. Recent stent exchange. EXAM: CT ANGIOGRAPHY ABDOMEN TECHNIQUE: Multidetector CT imaging of the abdomen was performed using the standard protocol during bolus administration of intravenous contrast. Multiplanar reconstructed images and MIPs were obtained and reviewed to evaluate the vascular anatomy. CONTRAST:  27mL OMNIPAQUE IOHEXOL 350 MG/ML SOLN COMPARISON:  01/13/2020 and 10/24/2019 FINDINGS: VASCULAR Aorta: Abdominal aorta is normal in caliber and demonstrates mild calcified plaque distally. Celiac: Celiac axis is patent. Pancreatic branch of the celiac demonstrates a 7 mm focal dilatation likely pseudoaneurysm. There is a 4 mm hyperdense focus of contrast approximately 1 cm to the left of the pseudoaneurysm as this hyperdense focus lies just inside the gastric wall with the stent crosses into the stomach. This hyperdense blush communicates with layering contrast over the dependent portion of the stomach likely acute hemorrhage. This area hemorrhage is larger on the delayed images. SMA: SMA is patent. Renals: Single bilateral renal arteries are patent with focal narrowing over the proximal 1 cm of the right renal artery. IMA: Patent diminutive IMA. Inflow: Calcified plaque over the visualized iliac arteries which are otherwise patent. Veins:  Unremarkable. Review of the MIP images confirms the above findings. NON-VASCULAR Lower chest: Minimal bibasilar dependent atelectasis. Small amount right pleural fluid. Several calcified pulmonary granulomas. Remainder of the lung bases are unremarkable. Hepatobiliary: Liver and gallbladder are unremarkable. Mild periportal edema is present. Pancreas:  Changes of chronic pancreatitis with calcifications throughout the pancreas. Patient stent is unchanged which has pigtail over the second portion the duodenum and crosses the pancreas and stomach wall with pigtail in the body of the stomach. Spleen: Unremarkable. Adrenals/Urinary Tract: Adrenal glands are normal. Kidneys are normal size and demonstrate a small right renal cyst unchanged. There are slight worsening patchy areas of low-attenuation throughout the cortex which may be due to infarction. No hydronephrosis. Stomach/Bowel: Interval significant distention of the stomach containing heterogeneous density material with mottled air collection. This heterogeneous slight increased density is likely due to mixed hemorrhage. This slightly hyperdense material extends into the duodenum. The visualized proximal jejunal loops are dilated measuring up to 3.8 cm. Mild diverticulosis of the colon. Colon is air and fluid-filled. Subtle changes which could reflect mild pneumatosis over the descending colon. Lymphatic: No adenopathy. Other: Mild amount of free peritoneal fluid is present which is new since the previous exam. No free peritoneal air. Musculoskeletal: Unchanged. IMPRESSION: VASCULAR 1. 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 There is evidence of acute hemorrhage occurring 1 cm to the left of this pseudoaneurysm just inside the gastric wall at the site of stent crossing into the stomach. 2. Normal caliber aorta with atherosclerotic disease. Aortic Atherosclerosis (ICD10-I70.0). 3. Patent bilateral renal arteries with focal narrowing of the proximal 1 cm of the right renal artery. NON-VASCULAR 1. Interval distension of the stomach and duodenum with heterogeneous slightly hyperdense material likely hemorrhage. Presumed site of this acute hemorrhage as described above. 2. Dilated visualized proximal small bowel loops. Air and  fluid throughout the visualized colon with possible pneumatosis involving the descending colon. 3. Worsening patchy bilateral low attenuation areas over the renal cortex which may be due to infarction. 4.  Worsening free peritoneal fluid. 5.  Evidence of chronic pancreatitis. Ordering physician has been paged. Electronically Signed: By: Elberta Fortisaniel  Boyle M.D. On: 01/14/2020 16:33   IR Angiogram Visceral Selective  Result Date: 01/15/2020 CLINICAL DATA:  GI bleed. 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 EXAM: SELECTIVE VISCERAL ARTERIOGRAPHY; ADDITIONAL ARTERIOGRAPHY; IR ULTRASOUND GUIDANCE VASC ACCESS RIGHT; IR EMBO ART VEN HEMORR LYMPH EXTRAV INC GUIDE ROADMAPPING ANESTHESIA/SEDATION: Patient was intubated and receiving IV fentanyl MEDICATIONS: Lidocaine 1% subcutaneous CONTRAST:  Omnipaque 300 IA PROCEDURE: The procedure was performed with implied emergent consent. Right femoral region prepped and draped in usual sterile fashion. Maximal barrier sterile technique was utilized including caps, mask, sterile gowns, sterile gloves, sterile drape, hand hygiene and skin antiseptic. The right common femoral artery was localized under ultrasound. Under real-time ultrasound guidance, the vessel was accessed with a 21-gauge micropuncture needle, exchanged over a 018 guidewire for a transitional dilator, through which a 035 guidewire was advanced. Over this, a 5 [place] vascular sheath was placed, through which a 5 [place] C2 catheter was advanced and used to selectively catheterize the celiac axis for selective arteriography. This was exchanged for a 5 [place] Sos catheter. Through this, a coaxial microcatheter with 016 guidewire were advanced across the lesion in the mid splenic artery. Coil embolization immediately distal to the lesion performed with 2, 3, and 4 mm interlock coils.  Separately, 5 mm coils x2 placed in the pseudoaneurysm. Separately, 2, 3, and 4 mm interlock coils were used in the  native splenic artery immediately proximal to the lesion. Hemostasis of flow was achieved. Follow-up arteriogram was performed. The catheter was removed. Because of coagulopathy, sheath was secured with 0 silk suture and placed to flush system. The patient tolerated the procedure well. 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 Technically successful coil embolization of the splenic artery immediately distal and proximal to the lesion. IMPRESSION: 1. 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 Electronically Signed   By: Lucrezia Europe M.D.   On: 01/15/2020 09:49   IR Angiogram Selective Each Additional Vessel  Result Date: 01/15/2020 CLINICAL DATA:  GI bleed. 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 EXAM: SELECTIVE VISCERAL ARTERIOGRAPHY; ADDITIONAL ARTERIOGRAPHY; IR ULTRASOUND GUIDANCE VASC ACCESS RIGHT; IR EMBO ART VEN HEMORR LYMPH EXTRAV INC GUIDE ROADMAPPING ANESTHESIA/SEDATION: Patient was intubated and receiving IV fentanyl MEDICATIONS: Lidocaine 1% subcutaneous CONTRAST:  Omnipaque 300 IA PROCEDURE: The procedure was performed with implied emergent consent. Right femoral region prepped and draped in usual sterile fashion. Maximal barrier sterile technique was utilized including caps, mask, sterile gowns, sterile gloves, sterile drape, hand hygiene and skin antiseptic. The right common femoral artery was localized under ultrasound. Under real-time ultrasound guidance, the vessel was accessed with a 21-gauge micropuncture needle, exchanged over a 018 guidewire for a transitional dilator, through which a 035 guidewire was advanced. Over this, a 5 [place] vascular sheath was placed, through which a 5 [place] C2 catheter was advanced and used to selectively catheterize the celiac axis for selective arteriography. This  was exchanged for a 5 [place] Sos catheter. Through this, a coaxial microcatheter with 016 guidewire were advanced across the lesion in the mid splenic artery. Coil embolization immediately distal to the lesion performed with 2, 3, and 4 mm interlock coils. Separately, 5 mm coils x2 placed in the pseudoaneurysm. Separately, 2, 3, and 4 mm interlock coils were used in the native splenic artery immediately proximal to the lesion. Hemostasis of flow was achieved. Follow-up arteriogram was performed. The catheter was removed. Because of coagulopathy, sheath was secured with 0 silk suture and placed to flush system. The patient tolerated the procedure well. 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 Technically successful coil embolization of the splenic artery immediately distal and proximal to the lesion. IMPRESSION: 1. 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 Electronically Signed   By: Lucrezia Europe M.D.   On: 01/15/2020 09:49   IR US Guide Vasc Access Right  Result Date: 01/15/2020 CLINICAL DATA:  GI bleed. 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 EXAM: SELECTIVE VISCERAL ARTERIOGRAPHY; ADDITIONAL ARTERIOGRAPHY; IR ULTRASOUND GUIDANCE VASC ACCESS RIGHT; IR EMBO ART VEN HEMORR LYMPH EXTRAV INC GUIDE ROADMAPPING ANESTHESIA/SEDATION: Patient was intubated and receiving IV fentanyl MEDICATIONS: Lidocaine 1% subcutaneous CONTRAST:  Omnipaque 300 IA PROCEDURE: The procedure was performed with implied emergent consent. Right femoral region prepped and draped in usual sterile fashion. Maximal barrier sterile technique was utilized including caps, mask, sterile gowns, sterile gloves, sterile drape, hand hygiene and skin antiseptic. The right common femoral artery was localized under ultrasound. Under real-time ultrasound  guidance, the vessel was accessed with a 21-gauge micropuncture needle, exchanged over a 018 guidewire  for a transitional dilator, through which a 035 guidewire was advanced. Over this, a 5 [place] vascular sheath was placed, through which a 5 [place] C2 catheter was advanced and used to selectively catheterize the celiac axis for selective arteriography. This was exchanged for a 5 [place] Sos catheter. Through this, a coaxial microcatheter with 016 guidewire were advanced across the lesion in the mid splenic artery. Coil embolization immediately distal to the lesion performed with 2, 3, and 4 mm interlock coils. Separately, 5 mm coils x2 placed in the pseudoaneurysm. Separately, 2, 3, and 4 mm interlock coils were used in the native splenic artery immediately proximal to the lesion. Hemostasis of flow was achieved. Follow-up arteriogram was performed. The catheter was removed. Because of coagulopathy, sheath was secured with 0 silk suture and placed to flush system. The patient tolerated the procedure well. 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 Technically successful coil embolization of the splenic artery immediately distal and proximal to the lesion. IMPRESSION: 1. 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 Electronically Signed   By: Corlis Leak M.D.   On: 01/15/2020 09:49   DG CHEST PORT 1 VIEW  Result Date: 01/15/2020 CLINICAL DATA:  ETT placement. EXAM: PORTABLE CHEST 1 VIEW COMPARISON:  January 14, 2020 FINDINGS: The ETT is in good position. Stable pacemaker. The heart, hila, mediastinum are normal. A right central line is again identified. No pneumothorax. Mild interstitial prominence may represent pulmonary venous congestion. No overt edema. Mild bibasilar atelectasis. No suspicious infiltrates. IMPRESSION: 1. Support apparatus as  above. 2. Suggested mild pulmonary venous congestion. Electronically Signed   By: Gerome Sam III M.D   On: 01/15/2020 11:24   Portable Chest x-ray  Result Date: 01/14/2020 CLINICAL DATA:  Status post ET tube placement. EXAM: PORTABLE CHEST 1 VIEW COMPARISON:  Multiple x-rays since Feb 15, 2016 FINDINGS: The ETT is in good position terminating in the mid trachea. A right IJ terminates in the central SVC. No pneumothorax. Bilateral calcified granulomas are seen in the lungs. No suspicious nodules or masses. No focal infiltrates. The heart, hila, mediastinum are normal. A stable pacemaker is identified. IMPRESSION: The ETT is in good position. The right IJ is in good position with no pneumothorax. No acute abnormalities. Electronically Signed   By: Gerome Sam III M.D   On: 01/14/2020 11:59   IR EMBO ART  VEN HEMORR LYMPH EXTRAV  INC GUIDE ROADMAPPING  Result Date: 01/15/2020 CLINICAL DATA:  GI bleed. 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 EXAM: SELECTIVE VISCERAL ARTERIOGRAPHY; ADDITIONAL ARTERIOGRAPHY; IR ULTRASOUND GUIDANCE VASC ACCESS RIGHT; IR EMBO ART VEN HEMORR LYMPH EXTRAV INC GUIDE ROADMAPPING ANESTHESIA/SEDATION: Patient was intubated and receiving IV fentanyl MEDICATIONS: Lidocaine 1% subcutaneous CONTRAST:  Omnipaque 300 IA PROCEDURE: The procedure was performed with implied emergent consent. Right femoral region prepped and draped in usual sterile fashion. Maximal barrier sterile technique was utilized including caps, mask, sterile gowns, sterile gloves, sterile drape, hand hygiene and skin antiseptic. The right common femoral artery was localized under ultrasound. Under real-time ultrasound guidance, the vessel was accessed with a 21-gauge micropuncture needle, exchanged over a 018 guidewire for a transitional dilator, through which a 035 guidewire was advanced. Over this, a 5 [place] vascular sheath was placed, through which a 5 [place] C2 catheter was  advanced and used to selectively catheterize the celiac axis for selective arteriography. This was exchanged for  a 5 [place] Sos catheter. Through this, a coaxial microcatheter with 016 guidewire were advanced across the lesion in the mid splenic artery. Coil embolization immediately distal to the lesion performed with 2, 3, and 4 mm interlock coils. Separately, 5 mm coils x2 placed in the pseudoaneurysm. Separately, 2, 3, and 4 mm interlock coils were used in the native splenic artery immediately proximal to the lesion. Hemostasis of flow was achieved. Follow-up arteriogram was performed. The catheter was removed. Because of coagulopathy, sheath was secured with 0 silk suture and placed to flush system. The patient tolerated the procedure well. 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 Technically successful coil embolization of the splenic artery immediately distal and proximal to the lesion. IMPRESSION: 1. 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 Electronically Signed   By: Corlis Leak M.D.   On: 01/15/2020 09:49    Anti-infectives: Anti-infectives (From admission, onward)   Start     Dose/Rate Route Frequency Ordered Stop   01/14/20 0800  cefTRIAXone (ROCEPHIN) 2 g in sodium chloride 0.9 % 100 mL IVPB     2 g 200 mL/hr over 30 Minutes Intravenous Every 24 hours 01/14/20 0508     01/13/20 2030  piperacillin-tazobactam (ZOSYN) IVPB 3.375 g     3.375 g 100 mL/hr over 30 Minutes Intravenous  Once 01/13/20 2017 01/13/20 2227      Assessment/Plan: Patient Active Problem List   Diagnosis Date Noted  . Essential hypertension 01/14/2020  . Acute pancreatitis 01/14/2020  . Gastrointestinal hemorrhage   . Acute hypoxemic respiratory failure (HCC)   . Pancreatitis, acute 01/13/2020  . Generalized anxiety disorder 12/20/2019  .  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 Chronic pain syndrome 08/02/2018  . Chronic hepatitis C without hepatic coma (HCC) 08/18/2017  . Hypothyroidism 07/08/2016  . Hearing loss, right 07/08/2016  . COPD (chronic obstructive pulmonary disease) (HCC) 09/06/2015  . SSS (sick sinus syndrome) (HCC) 09/06/2015   68yoF with history of alcohol abuse and chronic pancreatitis underwent cystogastrostomy in [facility] last week. Presented with acute GI bleed. Underwent endoscopy 4/10 with bleeding seen in the stomach without identification of bleed. CTA showed 75mm pseudoaneurysm of pancreatic branch mid pancreas where stent crosses into stomach -IR successfully embolized bleeding pseudoaneurysm; has stabilized since -Pt likely with residual GI blood prior to embolization -No need for surgery at this time. If there was a need that developed would rec trx to [facility] -please call with questions  LOS: 3 days    Natasha Jacobson 01/16/2020

## 2020-01-16 NOTE — Progress Notes (Signed)
eLink Physician-Brief Progress Note Patient Name: Natasha Jacobson DOB: [birth year] MRN: [id number]   Date of Service  01/16/2020  HPI/Events of Note  Request for AM labs On lab review patient was hypokalemia and anemic. Also with elevated liver enzymes on presentation  eICU Interventions  CBC CMP ordered     Intervention Category Minor Interventions: Electrolytes abnormality - evaluation and management  Darl Pikes 01/16/2020, 1:57 AM

## 2020-01-16 NOTE — Progress Notes (Signed)
 Gastroenterology Inpatient Follow-up Note   PATIENT IDENTIFICATION  Natasha Jacobson is a 68 y.o. female with a pmh significant for chronic alcoholic pancreatitis (status post interventions by advanced endoscopy [facility]), chronic pain syndrome, COPD, hypertension, hypothyroidism, prediabetes, thyroid disease.  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 Hospital Day: 4  SUBJECTIVE  The patient was evaluated on rounds this morning.  She remains hemodynamically stable.  Hemoglobin is stable.  However, her mental status is such that she only responds to pain.  She has had some continued output from her rectum suggestive of melena.  However hemoglobin is in the 12 range. Etiology for altered mental status is unclear at this time. Patient unable to give me further history today.     OBJECTIVE  Scheduled Inpatient Medications:  . sodium chloride   Intravenous Once  . sodium chloride   Intravenous Once  . sodium chloride   Intravenous Once  . Chlorhexidine Gluconate Cloth  6 each Topical Daily  . insulin aspart  0-6 Units Subcutaneous Q4H  . levothyroxine  25 mcg Intravenous Daily  . mouth rinse  15 mL Mouth Rinse BID  . pantoprazole (PROTONIX) IV  40 mg Intravenous Q12H   Continuous Inpatient Infusions:  . sodium chloride 999 mL/hr at 01/14/20 1058  . ampicillin (OMNIPEN) IV Stopped (01/16/20 1128)  . dextrose 30 mL/hr at 01/16/20 1200   PRN Inpatient Medications: sodium chloride, albuterol, hydrALAZINE, ondansetron **OR** ondansetron (ZOFRAN) IV   Physical Examination  Temp:  [94.1 F (34.5 C)-99.1 F (37.3 C)] 99.1 F (37.3 C) (04/12 1200) Pulse Rate:  [72-100] 100 (04/12 1200) Resp:  [15-45] 45 (04/12 1200) BP: (78-140)/(44-72) 137/72 (04/12 1200) SpO2:  [87 %-99 %] 94 % (04/12 1200) Arterial Line BP: (91-176)/(44-68) 128/62 (04/12 0600) Temp (24hrs), Avg:96.4 F (35.8 C),  Min:94.1 F (34.5 C), Max:99.1 F (37.3 C)  Weight: 50.7 kg GEN: Chronically ill-appearing, nontoxic today PSYCH: Although cooperative, she is not answering any questions EYE: Conjunctivae pink, sclerae anicteric ENT: NG tube in place, moist mucous membranes CV: Tachycardic RESP: No wheezing present GI: Protuberant abdomen, this to palpation with the patient describing pain vomiting patient throughout the abdomen, no rebound GU: DRE not performed but melena noted MSK/EXT: Lower extremity edema present SKIN: No jaundice NEURO: Her orientation and alertness is unclear, no evidence of clonus of the upper or lower extremities   Review of Data   Laboratory Studies   Recent Labs  Lab 01/14/20 2140 01/15/20 0833 01/16/20 0504 01/16/20 0504 01/16/20 1030  NA  --    < > 147*   < > 144  K  --    < > 5.1   < > 5.2*  CL  --    < > 107  --   --   CO2  --    < > 27  --   --   BUN  --    < > 48*  --   --   CREATININE  --    < > 3.04*  --   --   GLUCOSE  --    < > 159*  --   --   CALCIUM  --    < > 7.5*  --   --   MG 1.7  --   --   --   --    < > = values in this interval not displayed.     Recent Labs  Lab 01/16/20 0504  AST 1,473*  ALT 658*  ALKPHOS 45    Recent Labs  Lab 01/13/20 1705 01/13/20 1705 01/14/20 1042 01/14/20 1348 01/16/20 0554 01/16/20 1023 01/16/20 1030  WBC 17.9*   < > 38.0*   < > 39.3*  --   --   HGB 8.4*   < > 3.9*   < > 12.0   < > 11.6*  HCT 26.3*   < > 13.6*   < > 33.9*   < > 34.0*  PLT 293  --  189  --  57*  --   --    < > = values in this interval not displayed.   Recent Labs  Lab 01/14/20 1042 01/15/20 1013  INR 3.5* 1.5*   Imaging Studies  No new relevant studies to review  GI Procedures and Studies  No new relevant studies to review   ASSESSMENT  Natasha Jacobson is a 69 y.o. female with a pmh significant for chronic alcoholic pancreatitis (status post interventions by advanced endoscopy [facility]), chronic pain syndrome, COPD, hypertension,  hypothyroidism, prediabetes, thyroid disease.  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  The patient has been stable overall in regards to her recent splenic pseudoaneurysm status post embolization.  Hemoglobin this morning of 12.  She does have some evidence of melena, but I would expect this to last for a few days in the setting of her progressive and severe episode of bleeding.  Patient is clinical status was updated to her primary gastroenterologist, Dr. Corliss Parish at [facility].  I personally spoke with him in the advanced endoscopy team at [facility] this morning.  He has been updated about the clinical status.  He describes no desire of removing this current stent is in place.  As long as the patient is hemodynamically stable, the patient can be discharged and a follow-up will be set.  If the patient is hemodynamically stable and able to be discharged, then we will need to reach out to Sherrie Mustache ([phone]) to work on scheduling follow-up.  If however, the patient is not hemodynamically stable to be discharged, then the medical service will need to reach out to [facility] to have the patient transferred.  The etiology of her liver test abnormalities most likely is in association with her significant dynamic compromise from over the weekend.  We will check her INR tomorrow.  I suspect this is ischemic hepatopathy but we will need to monitor her liver tests closely tomorrow.  The etiology of her mental status change is not clear.  She is developing a bit of a progressive renal insufficiency which I suspect could be a result of the contrast nephropathy as well as ATN.  If the patient's mental status does not improve, may be reasonable to have nephrology weigh in as to whether renal replacement therapy would be beneficial and whether her mental status issues could be uremia induced although her current urea is lower  than I would expect for him to be causing uremic encephalopathy.  Will allow patient service to further evaluate and consider etiologies for altered mental status.  Certainly given lactulose although her ammonia level is normal but very little downside to the use of the if desired.  She remains high risk with a guarded prognosis.  We will see how she does in the next few days and whether any transfer to [facility]  may still be required but from the standpoint of bleeding as of this morning, she does not need a transfer.   PLAN/RECOMMENDATIONS  Daily hepatic function panel and INR Every 8 hour every 12 hour CBC Maintain IV PPI for now Consider lactulose although not clear that this is PSE mental status issues Appreciate medical service further evaluating other etiologies for mental status ages Appreciate medical service monitoring any function.  Nephrology consultation dependent on progression of injury Hepatitis C most likely will monitor and do additional work-up as needed [facility] dated on 4/12 has been patient's clinical stability in regards to her pseudoaneurysm with no plan for stent extraction at this time if she remains stable and no need for transfer per their report if she is hemodynamically stable Would consider [facility] transfer if the patient remains unstable for further medical issues prevented safe discharge (Contact at time of discharge Shellee Milo - [phone]) She will need to have a stable hemoglobin for days before she could be discharged safely   Please page/call with questions or concerns.   Justice Britain, MD [facility] Office # [phone]    LOS: 3 days  Irving Copas  01/16/2020, 2:11 PM

## 2020-01-16 NOTE — Procedures (Addendum)
Intubation Procedure Note Natasha Jacobson [id number] 03/12/1951  Procedure: Intubation Indications: Airway protection and maintenance , high wOB RR 40s  Procedure Details Consent: Risks of procedure as well as the alternatives and risks of each were explained to the (patient/caregiver).  Consent for procedure obtained. Time Out: Verified patient identification, verified procedure, site/side was marked, verified correct patient position, special equipment/implants available, medications/allergies/relevent history reviewed, required imaging and test results available.  Performed  Maximum sterile technique was used including gloves, gown, hand hygiene and mask.  MAC and 3  Versed 2 fent 100 mcg Etomidate 20  Evaluation Hemodynamic Status: Transient hypotension treated with fluid; O2 sats: stable throughout Patient's Current Condition: stable Complications: No apparent complications Patient did tolerate procedure well. Chest X-ray ordered to verify placement.  CXR: pending.  Procedure performed by Tamela Oddi MD under my direct supervision  [name] 01/16/2020

## 2020-01-16 NOTE — Progress Notes (Signed)
eLink Physician-Brief Progress Note Patient Name: Natasha Jacobson DOB: 1950/10/19 MRN: [id number]   Date of Service  01/16/2020  HPI/Events of Note  Multiple issues: 1. Agitation - Request for bilateral soft wrist and ankle restraints and 2. CVP = 4.   eICU Interventions  Will order: 1. Bilateral soft wrist and ankle restraints X 10 hours.  2. Bolus with 0.9 NaCL 1 liter IV over 1 hour now.      Intervention Category Major Interventions: Delirium, psychosis, severe agitation - evaluation and management;Other:  Lenell Antu 01/16/2020, 11:41 PM

## 2020-01-16 NOTE — Progress Notes (Signed)
Referring Physician(s): Dr. 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 Reintubated today for airway protection with increased WOB.  Hgb stable at 12.3  Allergies: Buprenorphine and Aspirin  Medications: Prior to Admission medications   Medication Sig Start Date End Date Taking? Authorizing Provider  acetaminophen (TYLENOL) 500 MG tablet Take 2 tablets by mouth every 8 (eight) hours as needed. 11/01/19   [provider]  albuterol (VENTOLIN HFA) 108 (90 Base) MCG/ACT inhaler Inhale 2 puffs into the lungs every 4 (four) hours as needed. 11/24/18   [provider]  amLODipine (NORVASC) 5 MG tablet Take 1 tablet by mouth daily. 09/22/19   [provider]  baclofen (LIORESAL) 10 MG tablet Take 1 tablet by mouth daily as needed. 11/11/19   [provider]  ergocalciferol (VITAMIN D2) 1.25 MG (50000 UT) capsule Take 1 capsule by mouth once a week. 11/18/19   [provider]  fenofibrate (TRICOR) 48 MG tablet Take 1 tablet by mouth daily. 05/13/18   [provider]  gabapentin (NEURONTIN) 300 MG capsule Take 1 capsule by mouth 3 (three) times daily. 11/11/19 11/10/20  [provider]  levothyroxine (SYNTHROID) 50 MCG tablet Take 50 mcg by mouth daily before breakfast.    [provider]  Melatonin 5 MG CAPS Take 2 capsules by mouth at bedtime.    [provider]  Pancrelipase, Lip-Prot-Amyl, (CREON) 24000-76000 units CPEP Take 1 capsule by mouth 3 (three) times daily. 08/18/19   [provider]  pantoprazole (PROTONIX) 40 MG tablet Take 40 mg by mouth daily.    [provider]  polyethylene glycol powder (GLYCOLAX/MIRALAX) 17 GM/SCOOP powder Take 17 g by mouth as needed.    [provider]  senna (SENOKOT) 8.6 MG TABS tablet Take 1 tablet by mouth as needed for mild  constipation.    [provider]  Tiotropium Bromide Monohydrate (SPIRIVA RESPIMAT) 1.25 MCG/ACT AERS Inhale 2 puffs into the lungs daily. 11/15/19 11/14/20  [provider]     Vital Signs: BP (!) 77/47   Pulse 88   Temp 98.8 F (37.1 C)   Resp (!) 26   Ht  (1.626 m)   Wt 111 lb 12.8 oz (50.7 kg)   SpO2 99%   BMI 19.19 kg/m   Physical Exam  Intubated, sedated Abdomen: soft, non-distended. No BRBPR.  Groin: R groin sheath in place.  Soft.   Imaging: DG Chest 2 View  Result Date: 01/13/2020 CLINICAL DATA:  Shortness of breath EXAM: CHEST - 2 VIEW COMPARISON:  06/16/2019 FINDINGS: Old granulomatous disease. Left pacer remains in place, unchanged. No confluent opacities or effusions. Heart is normal size. Aortic atherosclerosis. No acute bony abnormality. IMPRESSION: No acute cardiopulmonary disease. Old granulomatous disease. Electronically Signed   By: Charlett Nose M.D.   On: 01/13/2020 18:54   CT ANGIO ABDOMEN W &/OR WO CONTRAST  Addendum Date: 01/14/2020   ADDENDUM REPORT: 01/14/2020 16:45 ADDENDUM: PRA, Bing Neighbors, states that Dr. Deanne Coffer has viewed the CT scan and is aware of the findings. Dr. Deanne Coffer is currently performing a procedure on this patient. Electronically Signed   By: Elberta Fortis M.D.   On: 01/14/2020 16:45   Result Date: 01/14/2020 CLINICAL DATA:  GI bleed. Recent abdominal distention with nausea and vomiting. History of chronic pancreatitis. Recent stent exchange. EXAM: CT ANGIOGRAPHY ABDOMEN TECHNIQUE: Multidetector CT  imaging of the abdomen was performed using the standard protocol during bolus administration of intravenous contrast. Multiplanar reconstructed images and MIPs were obtained and reviewed to evaluate the vascular anatomy. CONTRAST:  80mL OMNIPAQUE IOHEXOL 350 MG/ML SOLN COMPARISON:  01/13/2020 and 10/24/2019 FINDINGS: VASCULAR Aorta: Abdominal aorta is normal in caliber and demonstrates mild calcified plaque distally. Celiac:  Celiac axis is patent. Pancreatic branch of the celiac demonstrates a 7 mm focal dilatation likely pseudoaneurysm. There is a 4 mm hyperdense focus of contrast approximately 1 cm to the left of the pseudoaneurysm as this hyperdense focus lies just inside the gastric wall with the stent crosses into the stomach. This hyperdense blush communicates with layering contrast over the dependent portion of the stomach likely acute hemorrhage. This area hemorrhage is larger on the delayed images. SMA: SMA is patent. Renals: Single bilateral renal arteries are patent with focal narrowing over the proximal 1 cm of the right renal artery. IMA: Patent diminutive IMA. Inflow: Calcified plaque over the visualized iliac arteries which are otherwise patent. Veins: Unremarkable. Review of the MIP images confirms the above findings. NON-VASCULAR Lower chest: Minimal bibasilar dependent atelectasis. Small amount right pleural fluid. Several calcified pulmonary granulomas. Remainder of the lung bases are unremarkable. Hepatobiliary: Liver and gallbladder are unremarkable. Mild periportal edema is present. Pancreas: Changes of chronic pancreatitis with calcifications throughout the pancreas. Patient stent is unchanged which has pigtail over the second portion the duodenum and crosses the pancreas and stomach wall with pigtail in the body of the stomach. Spleen: Unremarkable. Adrenals/Urinary Tract: Adrenal glands are normal. Kidneys are normal size and demonstrate a small right renal cyst unchanged. There are slight worsening patchy areas of low-attenuation throughout the cortex which may be due to infarction. No hydronephrosis. Stomach/Bowel: Interval significant distention of the stomach containing heterogeneous density material with mottled air collection. This heterogeneous slight increased density is likely due to mixed hemorrhage. This slightly hyperdense material extends into the duodenum. The visualized proximal jejunal loops are  dilated measuring up to 3.8 cm. Mild diverticulosis of the colon. Colon is air and fluid-filled. Subtle changes which could reflect mild pneumatosis over the descending colon. Lymphatic: No adenopathy. Other: Mild amount of free peritoneal fluid is present which is new since the previous exam. No free peritoneal air. Musculoskeletal: Unchanged. IMPRESSION: VASCULAR 1. 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 There is evidence of acute hemorrhage occurring 1 cm to the left of this pseudoaneurysm just inside the gastric wall at the site of stent crossing into the stomach. 2. Normal caliber aorta with atherosclerotic disease. Aortic Atherosclerosis (ICD10-I70.0). 3. Patent bilateral renal arteries with focal narrowing of the proximal 1 cm of the right renal artery. NON-VASCULAR 1. Interval distension of the stomach and duodenum with heterogeneous slightly hyperdense material likely hemorrhage. Presumed site of this acute hemorrhage as described above. 2. Dilated visualized proximal small bowel loops. Air and fluid throughout the visualized colon with possible pneumatosis involving the descending colon. 3. Worsening patchy bilateral low attenuation areas over the renal cortex which may be due to infarction. 4.  Worsening free peritoneal fluid. 5.  Evidence of chronic pancreatitis. Ordering physician has been paged. Electronically Signed: By: Elberta Fortis M.D. On: 01/14/2020 16:33   CT ABDOMEN PELVIS W CONTRAST  Result Date: 01/13/2020 CLINICAL DATA:  69 year old female with abdominal distention, nausea vomiting. History of chronic pancreatitis. Status post stent exchange. EXAM: CT ABDOMEN AND  PELVIS WITH CONTRAST TECHNIQUE: Multidetector CT imaging of the abdomen and pelvis was performed using the standard protocol following bolus administration of intravenous contrast. CONTRAST:  31mL OMNIPAQUE IOHEXOL 300 MG/ML  SOLN  COMPARISON:  CT abdomen pelvis dated 10/24/2019. FINDINGS: Lower chest: There is mild emphysematous changes of the visualized lung bases. Right lung base linear atelectasis/scarring. There is a small right middle lobe granuloma. Pacemaker wires noted. There is no intra-abdominal free air or free fluid. Hepatobiliary: The liver is unremarkable. No intrahepatic biliary ductal dilatation. The gallbladder is unremarkable. Pancreas: There is diffuse coarse calcification of the pancreas with moderate atrophy of the distal gland sequela of chronic pancreatitis. Mild peripancreatic haziness noted. Correlation with pancreatic enzymes recommended to exclude recurrent acute pancreatitis. A pancreatic stent noted with pigtail ends in the second portion of the duodenum and in the body of the stomach. No peripancreatic fluid collection or pseudocyst. Spleen: Normal in size without focal abnormality. Adrenals/Urinary Tract: The adrenal glands are unremarkable. Which shaped areas of hypoenhancement in the kidneys bilaterally, left greater right concerning for areas of renal parenchyma infarct. These are new since the prior CT. Correlation with urinalysis recommended to exclude pyelonephritis. There is a 1 cm right renal interpolar cyst. There is no hydronephrosis on either side. The visualized ureters and urinary bladder appear unremarkable. Stomach/Bowel: The stomach is distended with ingested content. There is moderate stool throughout the colon. There is no bowel obstruction or active inflammation. Appendectomy. Vascular/Lymphatic: There is advanced aortoiliac atherosclerotic disease. There is a 2.3 cm infrarenal aortic ectasia. The IVC is unremarkable. No portal venous gas. There is no adenopathy. Reproductive: Hysterectomy. No adnexal masses. Other: None Musculoskeletal: Degenerative changes primarily at L4-L5. No acute osseous pathology. IMPRESSION: 1. Mild peripancreatic haziness may represent acute on chronic pancreatitis.  Correlation with pancreatic enzymes recommended. A pancreatic stent with pigtail ends in the second portion of the duodenum and in the body of the stomach noted. No peripancreatic fluid collection or pseudocyst. 2. Bilateral renal parenchyma wedge-shaped infarcts, new since the prior CT. Correlation with urinalysis recommended to exclude pyelonephritis. 3. No bowel obstruction. 4. Aortic Atherosclerosis (ICD10-I70.0). Electronically Signed   By: Elgie Collard M.D.   On: 01/13/2020 19:05   IR Angiogram Visceral Selective  Result Date: 01/15/2020 CLINICAL DATA:  GI bleed. 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 EXAM: SELECTIVE VISCERAL ARTERIOGRAPHY; ADDITIONAL ARTERIOGRAPHY; IR ULTRASOUND GUIDANCE VASC ACCESS RIGHT; IR EMBO ART VEN HEMORR LYMPH EXTRAV INC GUIDE ROADMAPPING ANESTHESIA/SEDATION: Patient was intubated and receiving IV fentanyl MEDICATIONS: Lidocaine 1% subcutaneous CONTRAST:  Omnipaque 300 IA PROCEDURE: The procedure was performed with implied emergent consent. Right femoral region prepped and draped in usual sterile fashion. Maximal barrier sterile technique was utilized including caps, mask, sterile gowns, sterile gloves, sterile drape, hand hygiene and skin antiseptic. The right common femoral artery was localized under ultrasound. Under real-time ultrasound guidance, the vessel was accessed with a 21-gauge micropuncture needle, exchanged over a 018 guidewire for a transitional dilator, through which a 035 guidewire was advanced. Over this, a 5 [place] vascular sheath was placed, through which a 5 [place] C2 catheter was advanced and used to selectively catheterize the celiac axis for selective arteriography. This was exchanged for a 5 [place] Sos catheter. Through this, a coaxial microcatheter with 016 guidewire were advanced across the lesion in the mid splenic artery. Coil embolization immediately distal to the lesion performed with 2, 3, and 4 mm interlock coils.  Separately, 5 mm coils x2 placed in the pseudoaneurysm. Separately, 2,  3, and 4 mm interlock coils were used in the native splenic artery immediately proximal to the lesion. Hemostasis of flow was achieved. Follow-up arteriogram was performed. The catheter was removed. Because of coagulopathy, sheath was secured with 0 silk suture and placed to flush system. The patient tolerated the procedure well. 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 Technically successful coil embolization of the splenic artery immediately distal and proximal to the lesion. IMPRESSION: 1. 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 Electronically Signed   By: Corlis Leak M.D.   On: 01/15/2020 09:49   IR Angiogram Selective Each Additional Vessel  Result Date: 01/15/2020 CLINICAL DATA:  GI bleed. 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 EXAM: SELECTIVE VISCERAL ARTERIOGRAPHY; ADDITIONAL ARTERIOGRAPHY; IR ULTRASOUND GUIDANCE VASC ACCESS RIGHT; IR EMBO ART VEN HEMORR LYMPH EXTRAV INC GUIDE ROADMAPPING ANESTHESIA/SEDATION: Patient was intubated and receiving IV fentanyl MEDICATIONS: Lidocaine 1% subcutaneous CONTRAST:  Omnipaque 300 IA PROCEDURE: The procedure was performed with implied emergent consent. Right femoral region prepped and draped in usual sterile fashion. Maximal barrier sterile technique was utilized including caps, mask, sterile gowns, sterile gloves, sterile drape, hand hygiene and skin antiseptic. The right common femoral artery was localized under ultrasound. Under real-time ultrasound guidance, the vessel was accessed with a 21-gauge micropuncture needle, exchanged over a 018 guidewire for a transitional dilator, through which a 035 guidewire was advanced. Over this, a 5 [place] vascular sheath was placed, through which a 5  [place] C2 catheter was advanced and used to selectively catheterize the celiac axis for selective arteriography. This was exchanged for a 5 [place] Sos catheter. Through this, a coaxial microcatheter with 016 guidewire were advanced across the lesion in the mid splenic artery. Coil embolization immediately distal to the lesion performed with 2, 3, and 4 mm interlock coils. Separately, 5 mm coils x2 placed in the pseudoaneurysm. Separately, 2, 3, and 4 mm interlock coils were used in the native splenic artery immediately proximal to the lesion. Hemostasis of flow was achieved. Follow-up arteriogram was performed. The catheter was removed. Because of coagulopathy, sheath was secured with 0 silk suture and placed to flush system. The patient tolerated the procedure well. 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 Technically successful coil embolization of the splenic artery immediately distal and proximal to the lesion. IMPRESSION: 1. 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 Electronically Signed   By: Corlis Leak M.D.   On: 01/15/2020 09:49   IR US Guide Vasc Access Right  Result Date: 01/15/2020 CLINICAL DATA:  GI bleed. 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 EXAM: SELECTIVE VISCERAL ARTERIOGRAPHY; ADDITIONAL ARTERIOGRAPHY; IR ULTRASOUND GUIDANCE VASC ACCESS RIGHT; IR EMBO ART VEN HEMORR LYMPH EXTRAV INC GUIDE ROADMAPPING ANESTHESIA/SEDATION: Patient was intubated and receiving IV fentanyl MEDICATIONS: Lidocaine 1% subcutaneous CONTRAST:  Omnipaque 300 IA PROCEDURE: The procedure was performed with implied emergent consent. Right femoral region prepped and draped in usual sterile fashion. Maximal barrier sterile technique was utilized including caps, mask, sterile gowns, sterile gloves, sterile drape, hand  hygiene and skin antiseptic. The right common femoral artery was localized under ultrasound. Under real-time ultrasound guidance, the vessel was accessed with a 21-gauge micropuncture needle, exchanged over a 018 guidewire for a transitional dilator, through which a 035 guidewire was advanced. Over  this, a 5 [place] vascular sheath was placed, through which a 5 [place] C2 catheter was advanced and used to selectively catheterize the celiac axis for selective arteriography. This was exchanged for a 5 [place] Sos catheter. Through this, a coaxial microcatheter with 016 guidewire were advanced across the lesion in the mid splenic artery. Coil embolization immediately distal to the lesion performed with 2, 3, and 4 mm interlock coils. Separately, 5 mm coils x2 placed in the pseudoaneurysm. Separately, 2, 3, and 4 mm interlock coils were used in the native splenic artery immediately proximal to the lesion. Hemostasis of flow was achieved. Follow-up arteriogram was performed. The catheter was removed. Because of coagulopathy, sheath was secured with 0 silk suture and placed to flush system. The patient tolerated the procedure well. 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 Technically successful coil embolization of the splenic artery immediately distal and proximal to the lesion. IMPRESSION: 1. 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 Electronically Signed   By: Corlis Leak  Hassell M.D.   On: 01/15/2020 09:49   Portable Chest x-ray  Result Date: 01/16/2020 CLINICAL DATA:  Intubation EXAM: PORTABLE CHEST 1 VIEW COMPARISON:  01/15/2020 FINDINGS: Interval placement of esophagogastric tube, tip and side port below the diaphragm. Interval advancement of endotracheal tube, tip positioned above the carina. Right neck vascular catheter remains in position.  Unchanged mild, diffuse interstitial opacity and possible small layering pleural effusions. No new or focal airspace opacity. IMPRESSION: 1. Interval placement of esophagogastric tube, tip and side port below the diaphragm. 2. Interval advancement of endotracheal tube, tip positioned above the carina. Electronically Signed   By: Lauralyn PrimesAlex  Bibbey M.D.   On: 01/16/2020 15:54   DG CHEST PORT 1 VIEW  Result Date: 01/15/2020 CLINICAL DATA:  ETT placement. EXAM: PORTABLE CHEST 1 VIEW COMPARISON:  January 14, 2020 FINDINGS: The ETT is in good position. Stable pacemaker. The heart, hila, mediastinum are normal. A right central line is again identified. No pneumothorax. Mild interstitial prominence may represent pulmonary venous congestion. No overt edema. Mild bibasilar atelectasis. No suspicious infiltrates. IMPRESSION: 1. Support apparatus as above. 2. Suggested mild pulmonary venous congestion. Electronically Signed   By: Gerome Samavid  Williams III M.D   On: 01/15/2020 11:24   Portable Chest x-ray  Result Date: 01/14/2020 CLINICAL DATA:  Status post ET tube placement. EXAM: PORTABLE CHEST 1 VIEW COMPARISON:  Multiple x-rays since Feb 15, 2016 FINDINGS: The ETT is in good position terminating in the mid trachea. A right IJ terminates in the central SVC. No pneumothorax. Bilateral calcified granulomas are seen in the lungs. No suspicious nodules or masses. No focal infiltrates. The heart, hila, mediastinum are normal. A stable pacemaker is identified. IMPRESSION: The ETT is in good position. The right IJ is in good position with no pneumothorax. No acute abnormalities. Electronically Signed   By: Gerome Samavid  Williams III M.D   On: 01/14/2020 11:59   DG Abd Portable 1V  Result Date: 01/16/2020 CLINICAL DATA:  Nasogastric tube placement EXAM: PORTABLE ABDOMEN - 1 VIEW COMPARISON:  CT abdomen and pelvis January 13, 2020 FINDINGS: Nasogastric tube tip and side port in stomach. Stomach appears to contain extensive food material. No  bowel dilatation or air-fluid level to suggest bowel obstruction. No free air. Pancreatic stent again noted. Calcified granulomas in visualized portions of lungs. IMPRESSION: Nasogastric tube tip and side port in stomach. No bowel dilatation or free  air evident. Extensive apparent food material in stomach. Pancreatic stent present. Electronically Signed   By: Bretta Bang III M.D.   On: 01/16/2020 11:55   IR EMBO ART  VEN HEMORR LYMPH EXTRAV  INC GUIDE ROADMAPPING  Result Date: 01/15/2020 CLINICAL DATA:  GI bleed. 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 EXAM: SELECTIVE VISCERAL ARTERIOGRAPHY; ADDITIONAL ARTERIOGRAPHY; IR ULTRASOUND GUIDANCE VASC ACCESS RIGHT; IR EMBO ART VEN HEMORR LYMPH EXTRAV INC GUIDE ROADMAPPING ANESTHESIA/SEDATION: Patient was intubated and receiving IV fentanyl MEDICATIONS: Lidocaine 1% subcutaneous CONTRAST:  Omnipaque 300 IA PROCEDURE: The procedure was performed with implied emergent consent. Right femoral region prepped and draped in usual sterile fashion. Maximal barrier sterile technique was utilized including caps, mask, sterile gowns, sterile gloves, sterile drape, hand hygiene and skin antiseptic. The right common femoral artery was localized under ultrasound. Under real-time ultrasound guidance, the vessel was accessed with a 21-gauge micropuncture needle, exchanged over a 018 guidewire for a transitional dilator, through which a 035 guidewire was advanced. Over this, a 5 [place] vascular sheath was placed, through which a 5 [place] C2 catheter was advanced and used to selectively catheterize the celiac axis for selective arteriography. This was exchanged for a 5 [place] Sos catheter. Through this, a coaxial microcatheter with 016 guidewire were advanced across the lesion in the mid splenic artery. Coil embolization immediately distal to the lesion performed with 2, 3, and 4 mm interlock coils. Separately, 5 mm coils x2 placed in the pseudoaneurysm.  Separately, 2, 3, and 4 mm interlock coils were used in the native splenic artery immediately proximal to the lesion. Hemostasis of flow was achieved. Follow-up arteriogram was performed. The catheter was removed. Because of coagulopathy, sheath was secured with 0 silk suture and placed to flush system. The patient tolerated the procedure well. 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 Technically successful coil embolization of the splenic artery immediately distal and proximal to the lesion. IMPRESSION: 1. 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 Electronically Signed   By: Corlis Leak M.D.   On: 01/15/2020 09:49    Labs:  CBC: Recent Labs    11/17/19 1146 11/17/19 1146 01/13/20 1705 01/13/20 1705 01/14/20 1042 01/14/20 1348 01/15/20 2156 01/16/20 0554 01/16/20 1023 01/16/20 1030  WBC 9.3  --  17.9*  --  38.0*  --   --  39.3*  --   --   HGB 14.6   < > 8.4*   < > 3.9*   < > 11.8* 12.0 12.4 11.6*  HCT 44.0   < > 26.3*   < > 13.6*   < > 33.0* 33.9* 34.8* 34.0*  PLT 373.0  --  293  --  189  --   --  57*  --   --    < > = values in this interval not displayed.    COAGS: Recent Labs    01/14/20 1042 01/15/20 1013  INR 3.5* 1.5*    BMP: Recent Labs    01/13/20 1705 01/13/20 1705 01/14/20 1042 01/14/20 1348 01/15/20 0833 01/15/20 0837 01/16/20 0504 01/16/20 1030  NA 137   < > 145   < > 146* 148* 147* 144  K 4.3   < > 4.8   < > 2.8* 2.9* 5.1 5.2*  CL 105  --  113*  --   --  102 107  --   CO2 21*  --  <  7*  --   --  30 27  --   GLUCOSE 112*  --  56*  --   --  134* 159*  --   BUN 32*  --  37*  --   --  39* 48*  --   CALCIUM 8.3*  --  7.7*  --   --  7.5* 7.5*  --   CREATININE 1.68*  --  2.38*  --   --  2.66* 3.04*  --   GFRNONAA 31*  --  21*  --   --  18* 15*  --   GFRAA 36*  --  24*  --   --  21* 18*   --    < > = values in this interval not displayed.    LIVER FUNCTION TESTS: Recent Labs    11/17/19 1146 01/13/20 1705 01/14/20 1042 01/16/20 0504  BILITOT 0.5 0.4 0.3 0.9  AST 20 21 214* 1,473*  ALT 8 10 124* 658*  ALKPHOS 67 39 50 45  PROT 8.2 5.4* 3.1* 4.0*  ALBUMIN 4.3 2.8* 1.6* 1.9*    Assessment and Plan: GI bleed s/p embolization of mid splenic artery 4/10 WBC 39.3 HgB 12.3 Reintubated today due to increased work of breathing with altered mental status.  No further bleeding noted on exam today, per RN passing old blood.  Groin site intact.  Sheath remains in place. Her INR has trended down.  Consider sheath pull tomorrow. IR following.  Electronically Signed: Docia Barrier, PA 01/16/2020, 4:49 PM   I spent a total of 15 Minutes at the the patient's bedside AND on the patient's hospital floor or unit, greater than 50% of which was counseling/coordinating care for GI bleed.

## 2020-01-17 ENCOUNTER — Inpatient Hospital Stay (HOSPITAL_COMMUNITY): Payer: Medicare Other

## 2020-01-17 ENCOUNTER — Encounter (HOSPITAL_COMMUNITY): Payer: Self-pay | Admitting: Anesthesiology

## 2020-01-17 ENCOUNTER — Encounter (HOSPITAL_COMMUNITY)
Admission: EM | Disposition: A | Discharge status: Hospice | Payer: Self-pay | Source: Home / Self Care | Attending: Internal Medicine

## 2020-01-17 DIAGNOSIS — R578 Other shock: Secondary | ICD-10-CM

## 2020-01-17 DIAGNOSIS — D62 Acute posthemorrhagic anemia: Secondary | ICD-10-CM

## 2020-01-17 DIAGNOSIS — K259 Gastric ulcer, unspecified as acute or chronic, without hemorrhage or perforation: Secondary | ICD-10-CM

## 2020-01-17 DIAGNOSIS — N179 Acute kidney failure, unspecified: Secondary | ICD-10-CM | POA: Diagnosis not present

## 2020-01-17 DIAGNOSIS — K921 Melena: Secondary | ICD-10-CM

## 2020-01-17 DIAGNOSIS — J9601 Acute respiratory failure with hypoxia: Secondary | ICD-10-CM | POA: Diagnosis not present

## 2020-01-17 DIAGNOSIS — K922 Gastrointestinal hemorrhage, unspecified: Secondary | ICD-10-CM | POA: Diagnosis not present

## 2020-01-17 HISTORY — PX: ESOPHAGOGASTRODUODENOSCOPY: SHX5428

## 2020-01-17 HISTORY — PX: FOREIGN BODY REMOVAL: SHX962

## 2020-01-17 LAB — BASIC METABOLIC PANEL
Anion gap: 14 (ref 5–15)
BUN: 59 mg/dL — ABNORMAL HIGH (ref 8–23)
CO2: 25 mmol/L (ref 22–32)
Calcium: 7.2 mg/dL — ABNORMAL LOW (ref 8.9–10.3)
Chloride: 109 mmol/L (ref 98–111)
Creatinine, Ser: 3.43 mg/dL — ABNORMAL HIGH (ref 0.44–1.00)
GFR calc Af Amer: 15 mL/min — ABNORMAL LOW (ref >60)
GFR calc non Af Amer: 13 mL/min — ABNORMAL LOW (ref >60)
Glucose, Bld: 88 mg/dL (ref 70–99)
Potassium: 4.5 mmol/L (ref 3.5–5.1)
Sodium: 148 mmol/L — ABNORMAL HIGH (ref 135–145)

## 2020-01-17 LAB — CBC
HCT: 31.1 % — ABNORMAL LOW (ref 36.0–46.0)
HCT: 32.4 % — ABNORMAL LOW (ref 36.0–46.0)
Hemoglobin: 10.5 g/dL — ABNORMAL LOW (ref 12.0–15.0)
Hemoglobin: 11 g/dL — ABNORMAL LOW (ref 12.0–15.0)
MCH: 30.4 pg (ref 26.0–34.0)
MCH: 30.9 pg (ref 26.0–34.0)
MCHC: 33.8 g/dL (ref 30.0–36.0)
MCHC: 34 g/dL (ref 30.0–36.0)
MCV: 90.1 fL (ref 80.0–100.0)
MCV: 91 fL (ref 80.0–100.0)
Platelets: 56 10*3/uL — ABNORMAL LOW (ref 150–400)
Platelets: 55 10*3/uL — ABNORMAL LOW (ref 150–400)
RBC: 3.45 MIL/uL — ABNORMAL LOW (ref 3.87–5.11)
RBC: 3.56 MIL/uL — ABNORMAL LOW (ref 3.87–5.11)
RDW: 16.2 % — ABNORMAL HIGH (ref 11.5–15.5)
RDW: 16.1 % — ABNORMAL HIGH (ref 11.5–15.5)
WBC: 29.8 10*3/uL — ABNORMAL HIGH (ref 4.0–10.5)
WBC: 24.2 10*3/uL — ABNORMAL HIGH (ref 4.0–10.5)
nRBC: 1.1 % — ABNORMAL HIGH (ref 0.0–0.2)
nRBC: 1.6 % — ABNORMAL HIGH (ref 0.0–0.2)

## 2020-01-17 LAB — MAGNESIUM: Magnesium: 2 mg/dL (ref 1.7–2.4)

## 2020-01-17 LAB — GLUCOSE, CAPILLARY
Glucose-Capillary: 58 mg/dL — ABNORMAL LOW (ref 70–99)
Glucose-Capillary: 71 mg/dL (ref 70–99)
Glucose-Capillary: 73 mg/dL (ref 70–99)
Glucose-Capillary: 79 mg/dL (ref 70–99)
Glucose-Capillary: 81 mg/dL (ref 70–99)
Glucose-Capillary: 87 mg/dL (ref 70–99)

## 2020-01-17 LAB — PROTIME-INR
INR: 1.5 — ABNORMAL HIGH (ref 0.8–1.2)
Prothrombin Time: 17.7 seconds — ABNORMAL HIGH (ref 11.4–15.2)

## 2020-01-17 LAB — HEMOGLOBIN AND HEMATOCRIT, BLOOD
HCT: 31.5 % — ABNORMAL LOW (ref 36.0–46.0)
HCT: 32.5 % — ABNORMAL LOW (ref 36.0–46.0)
HCT: 33.6 % — ABNORMAL LOW (ref 36.0–46.0)
Hemoglobin: 10.9 g/dL — ABNORMAL LOW (ref 12.0–15.0)
Hemoglobin: 10.9 g/dL — ABNORMAL LOW (ref 12.0–15.0)
Hemoglobin: 11.1 g/dL — ABNORMAL LOW (ref 12.0–15.0)

## 2020-01-17 LAB — HEPATIC FUNCTION PANEL
ALT: 487 U/L — ABNORMAL HIGH (ref 0–44)
AST: 648 U/L — ABNORMAL HIGH (ref 15–41)
Albumin: 1.7 g/dL — ABNORMAL LOW (ref 3.5–5.0)
Alkaline Phosphatase: 64 U/L (ref 38–126)
Bilirubin, Direct: 0.3 mg/dL — ABNORMAL HIGH (ref 0.0–0.2)
Indirect Bilirubin: 0.6 mg/dL (ref 0.3–0.9)
Total Bilirubin: 0.9 mg/dL (ref 0.3–1.2)
Total Protein: 4.1 g/dL — ABNORMAL LOW (ref 6.5–8.1)

## 2020-01-17 LAB — PHOSPHORUS: Phosphorus: 6.4 mg/dL — ABNORMAL HIGH (ref 2.5–4.6)

## 2020-01-17 SURGERY — EGD (ESOPHAGOGASTRODUODENOSCOPY)
Anesthesia: Moderate Sedation

## 2020-01-17 MED ORDER — METOCLOPRAMIDE HCL 5 MG/ML IJ SOLN
5.0000 mg | Freq: Once | INTRAMUSCULAR | Status: AC
  Administered 2020-01-17: 17:00:00 5 mg via INTRAVENOUS
  Filled 2020-01-17: qty 2

## 2020-01-17 MED ORDER — SODIUM CHLORIDE 0.9 % IV SOLN
250.0000 mL | INTRAVENOUS | Status: DC
  Administered 2020-01-20 – 2020-01-28 (×4): 250 mL via INTRAVENOUS

## 2020-01-17 MED ORDER — FENTANYL CITRATE (PF) 100 MCG/2ML IJ SOLN
INTRAMUSCULAR | Status: DC | PRN
  Administered 2020-01-17 (×2): 25 ug via INTRAVENOUS
  Administered 2020-01-17: 50 ug via INTRAVENOUS
  Administered 2020-01-17 (×2): 25 ug via INTRAVENOUS

## 2020-01-17 MED ORDER — PHENYLEPHRINE HCL-NACL 10-0.9 MG/250ML-% IV SOLN
0.0000 ug/min | INTRAVENOUS | Status: DC
  Administered 2020-01-17 (×2): 90 ug/min via INTRAVENOUS
  Administered 2020-01-17: 18:00:00 70 ug/min via INTRAVENOUS
  Administered 2020-01-18: 25 ug/min via INTRAVENOUS
  Administered 2020-01-18: 50 ug/min via INTRAVENOUS
  Administered 2020-01-18: 25 ug/min via INTRAVENOUS
  Administered 2020-01-18: 70 ug/min via INTRAVENOUS
  Administered 2020-01-18: 03:00:00 60 ug/min via INTRAVENOUS
  Administered 2020-01-19 (×2): 24 ug/min via INTRAVENOUS
  Administered 2020-01-19: 26 ug/min via INTRAVENOUS
  Administered 2020-01-20: 30 ug/min via INTRAVENOUS
  Filled 2020-01-17 (×13): qty 250

## 2020-01-17 MED ORDER — DOCUSATE SODIUM 50 MG/5ML PO LIQD: 100.0000 mg | Freq: Two times a day (BID) | ORAL | Status: DC

## 2020-01-17 MED ORDER — VITAL AF 1.2 CAL PO LIQD
1000.0000 mL | ORAL | Status: DC
  Administered 2020-01-18 – 2020-01-22 (×4): 1000 mL
  Filled 2020-01-17: qty 1000

## 2020-01-17 MED ORDER — VITAL HIGH PROTEIN PO LIQD
1000.0000 mL | ORAL | Status: AC
  Administered 2020-01-17: 10:00:00 1000 mL

## 2020-01-17 MED ORDER — SODIUM CHLORIDE 0.9% FLUSH
10.0000 mL | Freq: Two times a day (BID) | INTRAVENOUS | Status: DC
  Administered 2020-01-17 – 2020-01-21 (×8): 10 mL
  Administered 2020-01-22: 40 mL
  Administered 2020-01-22: 10 mL
  Administered 2020-01-23: 40 mL
  Administered 2020-01-24 – 2020-02-01 (×15): 10 mL
  Administered 2020-02-02: 20 mL
  Administered 2020-02-02 – 2020-02-09 (×12): 10 mL

## 2020-01-17 MED ORDER — SODIUM CHLORIDE 0.9 % IV SOLN: INTRAVENOUS | Status: DC

## 2020-01-17 MED ORDER — SODIUM CHLORIDE 0.9% FLUSH
10.0000 mL | INTRAVENOUS | Status: DC | PRN
  Administered 2020-01-23: 10 mL

## 2020-01-17 MED ORDER — PHENYLEPHRINE HCL-NACL 10-0.9 MG/250ML-% IV SOLN
25.0000 ug/min | INTRAVENOUS | Status: DC
  Administered 2020-01-17: 15:00:00 50 ug/min via INTRAVENOUS
  Filled 2020-01-17: qty 250

## 2020-01-17 MED ORDER — POLYETHYLENE GLYCOL 3350 17 G PO PACK: 17.0000 g | PACK | Freq: Every day | ORAL | Status: DC

## 2020-01-17 MED ORDER — LEVOTHYROXINE SODIUM 50 MCG PO TABS
50.0000 ug | ORAL_TABLET | Freq: Every day | ORAL | Status: DC
  Administered 2020-01-17 – 2020-02-08 (×22): 50 ug
  Filled 2020-01-17: qty 1
  Filled 2020-01-17: qty 2
  Filled 2020-01-17 (×2): qty 1
  Filled 2020-01-17 (×2): qty 2
  Filled 2020-01-17: qty 1
  Filled 2020-01-17: qty 2
  Filled 2020-01-17 (×3): qty 1
  Filled 2020-01-17: qty 2
  Filled 2020-01-17 (×3): qty 1
  Filled 2020-01-17 (×2): qty 2
  Filled 2020-01-17: qty 1
  Filled 2020-01-17 (×3): qty 2
  Filled 2020-01-17 (×3): qty 1

## 2020-01-17 MED ORDER — FENTANYL CITRATE (PF) 100 MCG/2ML IJ SOLN
INTRAMUSCULAR | Status: AC
  Filled 2020-01-17: qty 4

## 2020-01-17 MED ORDER — MIDAZOLAM HCL (PF) 5 MG/ML IJ SOLN
INTRAMUSCULAR | Status: AC
  Filled 2020-01-17: qty 2

## 2020-01-17 MED ORDER — SODIUM CHLORIDE 0.9 % IV SOLN
INTRAVENOUS | Status: AC | PRN
  Administered 2020-01-17: 500 mL via INTRAMUSCULAR

## 2020-01-17 MED ORDER — PRO-STAT SUGAR FREE PO LIQD
30.0000 mL | Freq: Three times a day (TID) | ORAL | Status: DC
  Administered 2020-01-17 – 2020-01-23 (×17): 30 mL
  Filled 2020-01-17 (×17): qty 30

## 2020-01-17 MED ORDER — MIDAZOLAM HCL (PF) 10 MG/2ML IJ SOLN
INTRAMUSCULAR | Status: DC | PRN
  Administered 2020-01-17 (×5): 2 mg via INTRAVENOUS

## 2020-01-17 NOTE — Progress Notes (Signed)
Gastroenterology Inpatient Follow-up Note   PATIENT IDENTIFICATION  Natasha Jacobson is a 69 y.o. female with a pmh significant for chronic alcoholic pancreatitis (status post interventions by advanced endoscopy [facility]), chronic pain syndrome, COPD, hypertension, hypothyroidism, prediabetes, thyroid disease.  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 Hospital Day: 5  SUBJECTIVE  Patient reintubated yesterday. Hemoglobin down trended yesterday into this morning. Patient having more rectal bleeding as well as more NG tube output that is been brighter in color. Patient not on pressors. Patient progressive kidney dysfunction noted. Patient unable to follow commands. Concern for potential rebleeding.   OBJECTIVE  Scheduled Inpatient Medications:  . sodium chloride   Intravenous Once  . sodium chloride   Intravenous Once  . sodium chloride   Intravenous Once  . Chlorhexidine Gluconate Cloth  6 each Topical Daily  . docusate  100 mg Per Tube BID  . feeding supplement (PRO-STAT SUGAR FREE 64)  30 mL Per Tube TID  . insulin aspart  0-6 Units Subcutaneous Q4H  . levothyroxine  50 mcg Per Tube Q0600  . mouth rinse  15 mL Mouth Rinse BID  . metoCLOPramide (REGLAN) injection  5 mg Intravenous Once  . pantoprazole (PROTONIX) IV  40 mg Intravenous Q12H  . polyethylene glycol  17 g Per Tube Daily  . sodium chloride flush  10-40 mL Intracatheter Q12H   Continuous Inpatient Infusions:  . sodium chloride 999 mL/hr at 01/14/20 1058  . sodium chloride    . ampicillin (OMNIPEN) IV 1 g (01/17/20 1341)  . dexmedetomidine (PRECEDEX) IV infusion 0.8 mcg/kg/hr (01/17/20 1200)  . dextrose 30 mL/hr at 01/17/20 1200  . feeding supplement (VITAL AF 1.2 CAL)    . phenylephrine (NEO-SYNEPHRINE) Adult infusion     PRN Inpatient Medications: sodium chloride, albuterol, fentaNYL (SUBLIMAZE) injection,  fentaNYL (SUBLIMAZE) injection, hydrALAZINE, ondansetron **OR** ondansetron (ZOFRAN) IV, sodium chloride flush   Physical Examination  Temp:  [96.4 F (35.8 C)-100.4 F (38 C)] 98.2 F (36.8 C) (04/13 1445) Pulse Rate:  [31-103] 71 (04/13 1445) Resp:  [19-37] 19 (04/13 1445) BP: (69-141)/(43-114) 69/47 (04/13 1445) SpO2:  [93 %-100 %] 99 % (04/13 1445) FiO2 (%):  [40 %-60 %] 40 % (04/13 1200) Temp (24hrs), Avg:99.1 F (37.3 C), Min:96.4 F (35.8 C), Max:100.4 F (38 C)  Weight: 50.7 kg GEN: Chronically ill-appearing, toxic appearing today PSYCH: Intubated EYE: Sclerae anicteric ENT: NG tube in place with evidence of maroon-colored blood in tube CV: Tachycardic RESP: Ventilatory sounds present GI: Protuberant abdomen, tenderness to palpation upon deep palpation noted GU: Overt liquid melena present MSK/EXT: Lower extremity edema present SKIN: No jaundice NEURO: Patient not following commands, no clonus noted   Review of Data   Laboratory Studies   Recent Labs  Lab 01/17/20 0439  NA 148*  K 4.5  CL 109  CO2 25  BUN 59*  CREATININE 3.43*  GLUCOSE 88  CALCIUM 7.2*  MG 2.0  PHOS 6.4*   Recent Labs  Lab 01/17/20 0439  AST 648*  ALT 487*  ALKPHOS 64    Recent Labs  Lab 01/16/20 0554 01/16/20 1023 01/17/20 0439 01/17/20 1007 01/17/20 1350  WBC 39.3*  --  29.8*  --  24.2*  HGB 12.0   < > 10.5*   < > 11.0*  HCT 33.9*   < > 31.1*   < > 32.4*  PLT 57*  --  56*  --  55*   < > = values in this interval not displayed.   Recent Labs  Lab 01/14/20 1042 01/15/20 1013 01/17/20 0439  INR 3.5* 1.5* 1.5*   Imaging Studies  No new relevant studies to review  GI Procedures and Studies  No new relevant studies to review   ASSESSMENT  Ms. Brase is a 69 y.o. female with a pmh significant for chronic alcoholic pancreatitis (status post interventions by advanced endoscopy [facility]), chronic pain syndrome, COPD, hypertension, hypothyroidism, prediabetes, thyroid  disease.  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  The patient is not clinically stable.  I am concerned that she may have persistent bleeding.  In the setting of issues, most likely there will not be an endoscopic treatment that may help however, in the small off chance that the patient has some sort of ulcer at the area of her pancreatico gastrostomy or ampulla, I may be able to treat, and endoscopic for diagnostic evaluation would not be unreasonable.  The reason behind this, is that her kidney function continues to worsen.  I think if we sent her to a mesenteric arteriogram this afternoon or this evening, we may have more significant issues develop.  Due to a partial stability at this time and during the light of day I would like to move forward with an endoscopy.  I discussed this with the ICU team.  I also discussed this with the patient's daughter who agreed to move forward with diagnostic endoscopy, although understanding, that endoscopic options for treatment would be limited.  The risks and benefits of endoscopic evaluation were discussed with the patient; these include but are not limited to the risk of perforation, infection, bleeding, missed lesions, lack of diagnosis, severe illness requiring hospitalization, as well as anesthesia and sedation related illnesses.  The patient's daughter is agreeable to proceed.  Endoscopy later today.   PLAN/RECOMMENDATIONS  Stop all tube feeds Reglan 10 g now Maintain IV PPI Diagnostic endoscopy this afternoon Patient has recurrent bleeding that is stemming from the previous pseudoaneurysm then she needs transfer to [facility] Trend hemoglobin hematocrit as per ICU service   Please page/call with questions or concerns.   Natasha Parish, MD  Gastroenterology Advanced Endoscopy Office # [phone]    LOS: 4 days  Lemar Lofty   01/17/2020, 3:12 PM

## 2020-01-17 NOTE — Anesthesia Preprocedure Evaluation (Deleted)
Anesthesia Evaluation    Airway        Dental   Pulmonary           Cardiovascular      Neuro/Psych    GI/Hepatic   Endo/Other    Renal/GU      Musculoskeletal   Abdominal   Peds  Hematology   Anesthesia Other Findings   Reproductive/Obstetrics                              Anesthesia Physical Anesthesia Plan Anesthesia Quick Evaluation  

## 2020-01-17 NOTE — Progress Notes (Signed)
NAME:  Natasha Jacobson, MRN:  [id number], DOB:  12/27/1950, LOS: 4 ADMISSION DATE:  01/13/2020, CONSULTATION DATE:  01/14/20 REFERRING MD:  Adela Lank, CHIEF COMPLAINT:  AMS   Brief History   69 year old woman with a hx of alcoholic pancreatitis s/p recent exchange of pancreaticogastrostomy stent at [facility] (4/8) who presented to the ED on 4/9 with hematemesis. On 4/10 she developed hematochezia, encephalopathy, and hypotension. She was transferred to the ICU and taken for emergent EGD that illustrated active upper GI hemorrhage with inadequate visualization for source control. 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 She was subsequently take for selective splenic arteriogram and coiled embolization of the pseudoaneurysm (4/10).   Past Medical History  Alcohol use disorder, chronic pancreatitis, COPD, CAD, HLD, HTN, MI, sick sinus syndrome  Significant Hospital Events   4/9 admitted; mass transfusion protocol 4/10 transfer to ICU; EGD, IR embolization 4/11 extubated 4/12 white count remains elevated at 39k, LFTs significantly elevated; continues to have dark red stools; hgb stable; minimal responsiveness; reintubated for increased work of breathing 4/13 LFTs improving. hgb stable. More responsive this morning. Minimal improvement in respiratory status  Consults:  GI IR Procedures:  4/10 Intubation>>4/11 4/10 central line>>  4/12 ETT>>  Significant Diagnostic Tests:  CT A/P 01/13/20>>Mild peripancreatic haziness may represent acute on chronic pancreatitis. A pancreatic stent with pigtail ends in the second portion of the duodenum and in the body of the stomach noted. No peripancreatic fluid collection or pseudocyst. Bilateral renal parenchyma wedge-shaped infarcts, new since the prior CT.   4/9 CTA a/p>>69mm pseudoaneurysm over a pancreatic branch of  the celiac. Evidence of acute hemorrhage.  -Patient bilateral renal arteries with focal narrowing of the proximal 1cm of right renal artery. -interval distension of the stomach/duodenum likely 2/2 hemorrhage. Dilated proximal small bowel loops. Possible penumatosis involving descending colon  -worsening patchy bilateral low attenuation areas over the renal cortex which may be due to infarction -worsening free peritoneal fluid  4/13 CXR>>bibasilar atelectasis and small left effusion  Micro Data:  4/9 Urine culture>> ++ e.faecalis 4/9 COVID Neg  Antimicrobials:  4/9 Zosyn x 1 4/10 Ceftriaxone >> 4/12 Ampicillin 4/12>>  Interim history/subjective:  Continues to have intermittent dark red stools. Mental status does seem somewhat improved this morning however still very encephalopathic.  Objective   Blood pressure (!) 87/66, pulse 82, temperature 98.4 F (36.9 C), resp. rate (!) 30, height 5\' 4"  (1.626 m), weight 50.7 kg, SpO2 99 %.    Vent Mode: PRVC FiO2 (%):  [40 %-60 %] 40 % Set Rate:  [20 bmp] 20 bmp Vt Set:  [440 mL] 440 mL PEEP:  [5 cmH20] 5 cmH20 Plateau Pressure:  [16 cmH20-21 cmH20] 16 cmH20   Intake/Output Summary (Last 24 hours) at 01/17/2020 0545 Last data filed at 01/17/2020 0500 Gross per 24 hour  Intake 3707.87 ml  Output 1980 ml  Net 1727.87 ml   Filed Weights   01/14/20 0108  Weight: 50.7 kg   Examination: General: critically ill appearing female HENT: ETT Pulm: ventilated on pressure support. Tachypneic. Bilateral expiratory wheezes somewhat improved from yesterday. CV: tachycardic rate. No peripheral edema Abdomen: abd distended and firm. Pain with palpation of all quadrants. Extremities: warm with bear hugger on Skin: no rash Neuro: awakens to voice. Shakes and nods her head to questions. Follows some commands. No focal deficits  appreciated.  Resolved Hospital Problem list   Hemorrhagic shock 2/2 bleeding 37mm pseudoaneurysm and recent instrumentation  s/p EGD and coil embolization by IR 4/10  Assessment & Plan:    Acute hypoxemic respiratory failure requiring mechanical ventilation reintubated 4/12 for increased work of breathing.  A: respirations in the 40s this morning on pressure support. No significant CXR changes. P: will hold off on extubation today given her RBSI ~100. daily SBTs, VAP bundling  AKI 2/2 ATN A: Slight progression of her renal failure indicated on labs this morning. Continues to have good UOP which is reassuring.  Metabolic alkalosis. Possibly contraction alkalosis Hypernatremia Hyperphosphatemia  Hyperkalemia--resolved P: --continue close monitoring of her renal function  E. Faecalis urinary tract infection.  P: continue ampicillin day #2/5  Acute encephalopathy. Metabolic vs sedation vs central A: Agitated overnight>increased precedex requirement. No focal neurological deficits appreciated to call for head imaging at this time. Given her history of alcohol use, may be some withdrawal as well. P: continue precedex with close monitoring of her MAPs as her pressures are soft. wean sedation as able.   Acute blood loss anemia>GI bleed>splenic pseudoaneurysm>recent instrumentation for pancreatic stent A: hgb remaining stable this morning. P: continue to monitor hemoglobin Thrombocytopenia.  A: Significant drop since admission 293>57. Stable this morning. Llikely dilutional given the massive transfusion protocol requiring on admission. P: continue close monitoring and consider transfusion if bleeding worsens Leukocytosis A: Etiology remains unclear. Her blood cultures have remained negative however she does have the e. Faecalis UTI. Admission imaging did not suggest pyelonephritis.  White count is trending down now from 39k>30k which is reassuring. Admission procal normal P: will continue to monitor  Liver shock.  A:Down trending this morning; AST 1473>648, ALT 658>487. Bili remains normal. Coags down  trending as well which is reassuring INR 3.5>>1.5 Likely due to hemorrhagic shock as they are now improving since resolution of hemorrhaging.  P: will continue to monitor closely.   High risk Malnutrition. will start trickle feeds today  Hypothyroidism. Continue synthroid.  Best practice:  Diet: NPO Pain/Anxiety/Delirium protocol (if indicated): precedex, prn fentanyl VAP protocol (if indicated): yes DVT prophylaxis: SCDs GI prophylaxis: PPI bid Glucose control: SSI Mobility: BR Code Status: Full Family Communication: will try to reach out to her daughter today for updates Disposition: ICU  Labs   CBC: Recent Labs  Lab 01/13/20 1705 01/13/20 1705 01/14/20 1042 01/14/20 1348 01/16/20 0554 01/16/20 0554 01/16/20 1023 01/16/20 1030 01/16/20 1812 01/17/20 0205 01/17/20 0439  WBC 17.9*  --  38.0*  --  39.3*  --   --   --   --   --  29.8*  NEUTROABS 13.6*  --  29.4*  --   --   --   --   --   --   --   --   HGB 8.4*   < > 3.9*   < > 12.0   < > 12.4 11.6* 11.7* 10.9* 10.5*  HCT 26.3*   < > 13.6*   < > 33.9*   < > 34.8* 34.0* 33.8* 31.5* 31.1*  MCV 94.3  --  111.5*  --  86.7  --   --   --   --   --  90.1  PLT 293  --  189  --  57*  --   --   --   --   --  56*   < > = values in this interval not displayed.    Basic Metabolic Panel: Recent  Labs  Lab 01/13/20 1705 01/13/20 1705 01/14/20 1042 01/14/20 1348 01/14/20 1944 01/14/20 2140 01/15/20 0833 01/15/20 0837 01/16/20 0504 01/16/20 1030 01/16/20 1812  NA 137   < > 145   < >   < >  --  146* 148* 147* 144 147*  K 4.3   < > 4.8   < >   < >  --  2.8* 2.9* 5.1 5.2* 5.1  CL 105  --  113*  --   --   --   --  102 107  --  107  CO2 21*  --  <7*  --   --   --   --  30 27  --  27  GLUCOSE 112*  --  56*  --   --   --   --  134* 159*  --  171*  BUN 32*  --  37*  --   --   --   --  39* 48*  --  53*  CREATININE 1.68*  --  2.38*  --   --   --   --  2.66* 3.04*  --  3.22*  CALCIUM 8.3*  --  7.7*  --   --   --   --  7.5* 7.5*  --   7.1*  MG  --   --   --   --   --  1.7  --   --   --   --   --    < > = values in this interval not displayed.   GFR: Estimated Creatinine Clearance: 13.8 mL/min (A) (by C-G formula based on SCr of 3.22 mg/dL (H)). Recent Labs  Lab 01/13/20 1705 01/14/20 1042 01/16/20 0554 01/17/20 0439  WBC 17.9* 38.0* 39.3* 29.8*    Liver Function Tests: Recent Labs  Lab 01/13/20 1705 01/14/20 1042 01/16/20 0504  AST 21 214* 1,473*  ALT 10 124* 658*  ALKPHOS 39 50 45  BILITOT 0.4 0.3 0.9  PROT 5.4* 3.1* 4.0*  ALBUMIN 2.8* 1.6* 1.9*   Recent Labs  Lab 01/13/20 1705  LIPASE 256*   Recent Labs  Lab 01/16/20 1023  AMMONIA 35    ABG    Component Value Date/Time   PHART 7.506 (H) 01/16/2020 1030   PCO2ART 35.3 01/16/2020 1030   PO2ART 48.0 (L) 01/16/2020 1030   HCO3 28.2 (H) 01/16/2020 1030   TCO2 29 01/16/2020 1030   ACIDBASEDEF 7.0 (H) 01/14/2020 1944   O2SAT 89.0 01/16/2020 1030     Coagulation Profile: Recent Labs  Lab 01/14/20 1042 01/15/20 1013 01/17/20 0439  INR 3.5* 1.5* 1.5*    Cardiac Enzymes: No results for input(s): CKTOTAL, CKMB, CKMBINDEX, TROPONINI in the last 168 hours.  HbA1C: Hgb A1c MFr Bld  Date/Time Value Ref Range Status  01/14/2020 09:40 PM 5.4 4.8 - 5.6 % Final    Comment:    (NOTE)         Prediabetes: 5.7 - 6.4         Diabetes: >6.4         Glycemic control for adults with diabetes: <7.0     CBG: Recent Labs  Lab 01/16/20 1923 01/16/20 2135 01/16/20 2316 01/16/20 2318 01/17/20 0310  GLUCAP 69* 84 68* 81 79

## 2020-01-17 NOTE — Interval H&P Note (Signed)
History and Physical Interval Note:  01/17/2020 3:19 PM  Natasha Jacobson  has presented today for surgery, with the diagnosis of GIB.  The various methods of treatment have been discussed with the patient and family. After consideration of risks, benefits and other options for treatment, the patient has consented to  Procedure(s): ESOPHAGOGASTRODUODENOSCOPY (EGD) WITH PROPOFOL (N/A) as a surgical intervention.  The patient's history has been reviewed, patient examined, no change in status, stable for surgery.  I have reviewed the patient's chart and labs.  Questions were answered to the patient's satisfaction.     [employer]

## 2020-01-17 NOTE — Progress Notes (Signed)
eLink Physician-Brief Progress Note Patient Name: Natasha Jacobson DOB: 30-Aug-1951 MRN: [id number]   Date of Service  01/17/2020  HPI/Events of Note  Nursing has allowed bilateral soft wrist restraints to expire.  eICU Interventions  Will order: 1. Bilateral soft wrist restraints X 11 hours.      Intervention Category Major Interventions: Delirium, psychosis, severe agitation - evaluation and management  Natasha Jacobson 01/17/2020, 9:08 PM

## 2020-01-17 NOTE — Op Note (Signed)
[facility] Patient Name: Natasha Jacobson Procedure Date : 01/17/2020 MRN: [id number] Attending MD: Justice Britain , MD Date of Birth: Feb 13, 1951 CSN: [id number] Age: 69 Admit Type: Inpatient Procedure:                Upper GI endoscopy Indications:              Hematemesis, Melena, Active gastrointestinal                            bleeding, Recent gastrointestinal bleeding, Recent                            Pseudoaneurysm s/p embolization with recurrent NGT                            blood and Melena/BRBPR on exam Providers:                Justice Britain, MD, Carlyn Reichert, RN, Lazaro Arms, Technician, Corie Chiquito, Technician Referring MD:             Carlota Raspberry. Armbruster, MD, ICU Staff Medicines:                Fentanyl 100 micrograms IV, Midazolam 10 mg IV -                            Administered by GI Team with constant hemodynamic                            evaluation (patient already on Ventilator per ICU) Complications:            No immediate complications. Estimated Blood Loss:     Estimated blood loss was minimal. Procedure:                Pre-Anesthesia Assessment:                           - Prior to the procedure, a History and Physical                            was performed, and patient medications and                            allergies were reviewed. The patient's tolerance of                            previous anesthesia was also reviewed. The risks                            and benefits of the procedure and the sedation                            options and risks were discussed with the patient.  All questions were answered, and informed consent                            was obtained. Prior Anticoagulants: The patient has                            taken no previous anticoagulant or antiplatelet                            agents. ASA Grade Assessment: IV - A patient with                             severe systemic disease that is a constant threat                            to life. After reviewing the risks and benefits,                            the patient was deemed in satisfactory condition to                            undergo the procedure.                           After obtaining informed consent, the endoscope was                            passed under direct vision. Throughout the                            procedure, the patient's blood pressure, pulse, and                            oxygen saturations were monitored continuously. The                            [id number]-[id number] ([id number]) Olympus gastroscope was                            introduced through the mouth, and advanced to the                            second part of duodenum. The [id number]-[id number] ([id number])                            Olympus therapeutic gastroscope was introduced                            through the mouth, and advanced to the second part                            of duodenum. The upper GI endoscopy was technically  difficult and complex due to excessive bleeding.                            Successful completion of the procedure was aided by                            performing the maneuvers documented (below) in this                            report. The patient tolerated the procedure. Scope In: Scope Out: Findings:      LA Grade A (one or more mucosal breaks less than 5 mm, not extending       between tops of 2 mucosal folds) esophagitis with no bleeding was found       in the distal esophagus.      A 3 cm hiatal hernia was present.      At the proximal portion of the hiatal hernia right below the Dinosaur, one non-bleeding superficial gastric ulcer with a clean ulcer       base (Forrest Class III) was found. The lesion was 8 mm in largest       dimension.      Diffuse moderate mucosal changes characterized by congestion,       discoloration and a  decreased vascular pattern were found in the gastric       fundus and in the gastric body. Query developing ischemia.      Clotted blood was found in the cardia, in the gastric fundus and in the       gastric body. Lavage and then Suction via Endoscope was performed and       use of Super-Suction Device after 45 minutes was successful at clearing       majority of clot burden.      Evidence of a pancreaticogastrostomy was found in the posterior wall of       the stomach. This was characterized by inflammation. There was evidence       of a double-pigtail stent in place. No active extravasation or bleeding       was noted after clearance of the region.      Multiple dispersed small erosions with no bleeding and no stigmata of       recent bleeding were found in the gastric body and in the gastric antrum       - query stress related vs NG tube related.      Clotted blood was found at the ampulla, in the duodenal bulb, in the       first portion of the duodenum and in the second portion of the duodenum.       Lavage and then Suction via Endoscope was performed and with       Super-Suction device were successful at clearance.      A previously placed double pigtail plastic stent was seen in the       ampulla. No evidence of extravasation from sphincterotomy site or stent.      Monitoring of area after 15 minutes of clearance showed no evidence of       ongoing bleeding. A 14 Fr orogastric tube was placed through the nares       into the esophagus. Under endoscopic guidance, the tube was advanced  into the stomach. Placement was confirmed by scope visualization. Impression:               - LA Grade A esophagitis with no bleeding.                           - 3 cm hiatal hernia.                           - Non-bleeding proximal gastric ulcer with a clean                            ulcer base (Forrest Class III).                           - Congested, discolored and decreased vascular                             pattern mucosa in the gastric fundus and gastric                            body - query developing ischemia.                           - Clotted blood in the cardia, in the gastric                            fundus and in the gastric body. Lavaged and                            suctioned >45 minutes with clearance.                           - A pancreticogastrostomy was found, characterized                            by inflammation and a double pigtail stent in                            place. No active bleeding noted.                           - Erosive gastropathy with no bleeding and no                            stigmata of recent bleeding.                           - Blood at the ampulla, in the duodenal bulb and in                            the second portion of the duodenum. Lavaged and                            suctioned with clearance.                           -  Double pigtail stent through ampulla without                            evidence of active bleeding.                           - OG tube placement was successfully performed. Recommendation:           - The patient will be observed post-procedure,                            until all discharge criteria are met.                           - Return patient to ICU care.                           - NPO today. Likely able to advance diet if she is                            awake and following commands to clear liquid or                            tomorrow, consider initiation of Tube feeds to                            improve nutrition needs.                           - Observe patient's clinical course.                           - Trend Hgb/Hct. If evidence of significant BRB per                            OG tube or progressive melena with anemia                            development, then the patient will need a repeat                            evaluation of the splenic pseudoaneurysm via                             arteriography vs CT-angiography. There are no                            endoscopically amenable areas of treatment at this                            time.                           - Further workup/management of mental status  changes per ICU service.                           - If further issues arise from bleeding perspective                            then transfer to [facility] should be considered.                           - The findings and recommendations were discussed                            with the patient's family.                           - The findings and recommendations were discussed                            with the referring physician. Procedure Code(s):        --- Professional ---                           [phone], Esophagogastroduodenoscopy, flexible,                            transoral; with insertion of intraluminal tube or                            catheter Diagnosis Code(s):        --- Professional ---                           K20.90, Esophagitis, unspecified without bleeding                           K44.9, Diaphragmatic hernia without obstruction or                            gangrene                           K25.9, Gastric ulcer, unspecified as acute or                            chronic, without hemorrhage or perforation                           K31.89, Other diseases of stomach and duodenum                           K92.2, Gastrointestinal hemorrhage, unspecified                           Z98.890, Other specified postprocedural states                           K92.0, Hematemesis  K92.1, Melena (includes Hematochezia) CPT copyright 2019 [employer]. All rights reserved. The codes documented in this report are preliminary and upon coder review may  be revised to meet current compliance requirements. Justice Britain, MD 01/17/2020 5:57:38 PM Number of Addenda: 0

## 2020-01-17 NOTE — Progress Notes (Addendum)
Initial Nutrition Assessment  RD working remotely.   DOCUMENTATION CODES:   Not applicable  INTERVENTION:  - will order TF: Vital AF 1.2 @ 20 ml/hr with 30 ml prostat TID and 100 ml free water every 4 hours. - this regimen will provide 876 kcal, 81 grams protein, and 989 ml free water.  - goal rate: Vital 1.2 @ 55 ml/hr with 100 ml free water every 4 hours. - goal rate regimen will provide 1584 kcal (104% estimated kcal need), 99 grams protein, and 1670 ml free water.    NUTRITION DIAGNOSIS:   Inadequate oral intake related to inability to eat as evidenced by NPO status.  GOAL:   Patient will meet greater than or equal to 90% of their needs  MONITOR:   Vent status, TF tolerance, Labs, Weight trends  REASON FOR ASSESSMENT:   Ventilator, Consult Enteral/tube feeding initiation and management  ASSESSMENT:   69 y.o. female with history of chronic alcoholic pancreatitis s/p exchange of pancreatico-gastrostomy stent at [facility] on 01/12/20. She started developing abdominal pain later in the evening which continued to worsen and 4/9 she began having hematemesis. She also had dark stools following which patient decided to come to the ED. Pain has been continuous epigastric in nature radiating to the back. CT abdomen/pelvis in the ED showed concern for acute on chronic pancreatitis and UA concerning for UTI. No bed available at [facility] to transfer patient so ED MD talked with GI and patient was admitted at [facility].  She was intubated yesterday at ~1505 and 30F NGT placed in R nare at a few hours prior to that. Patient remains intubated at this time. NGT now clamped with plan to start TF. Weight on 4/10 was 112 lb, weight on 12/20/19 was 108 lb, and weight on 10/24/19 was 113 lb.   Per notes: - chronic alcoholic pancreatitis with strictures s/p stent placement at [facility] on 4/8 - UGIB and hemorrhagic shock on admission--s/p embolization of splenic artery pseudoanerysm; improved -  starting SBTs but holding on extubation - AKI - UTI   Patient is currently intubated on ventilator support MV: 12.6 L/min Temp (24hrs), Avg:99.1 F (37.3 C), Min:97.5 F (36.4 C), Max:100.4 F (38 C) Propofol: none  Labs reviewed; CBGs: 79 and 81 mg/dl, Na: 174 mmol/l, BUN: 59 mg/dl, creatinine: 0.81 mg/dl, Ca: 7.2 mg/dl, Phos: 6.4 mg/dl, LFTs elevated but trending down, GFR: 13 ml/min.  Medications reviewed; 100 mg colace BID, sliding scale novolog, 50 mcg synthroid per NGT/day, 40 mg IV protonix BID, 1 packet miralax/day.  IVF; D10 @ 30 ml/hr (245 kcal). Drip; precedex @ 1 mcg/kg/hr.     NUTRITION - FOCUSED PHYSICAL EXAM:  unable to complete at this time.  Diet Order:   Diet Order            Diet NPO time specified  Diet effective now              EDUCATION NEEDS:   Not appropriate for education at this time  Skin:  Skin Assessment: Reviewed RN Assessment  Last BM:  4/12  Height:   Ht Readings from Last 1 Encounters:  01/14/20 5\' 4"  (1.626 m)    Weight:   Wt Readings from Last 1 Encounters:  01/14/20 50.7 kg    Ideal Body Weight:  54.5 kg  BMI:  Body mass index is 19.19 kg/m.  Estimated Nutritional Needs:   Kcal:  1520 kcal  Protein:  76-101 grams (1.5-2 grams/kg)  Fluid:  >/=  1.8 L/day     Jarome Matin, MS, RD, LDN, CNSC Inpatient Clinical Dietitian RD pager # available in [facility]  After hours/weekend pager # available in [facility]

## 2020-01-18 ENCOUNTER — Encounter: Payer: Self-pay | Admitting: *Deleted

## 2020-01-18 DIAGNOSIS — K859 Acute pancreatitis without necrosis or infection, unspecified: Secondary | ICD-10-CM | POA: Diagnosis not present

## 2020-01-18 DIAGNOSIS — Z9889 Other specified postprocedural states: Secondary | ICD-10-CM | POA: Diagnosis not present

## 2020-01-18 DIAGNOSIS — G9340 Encephalopathy, unspecified: Secondary | ICD-10-CM | POA: Diagnosis not present

## 2020-01-18 DIAGNOSIS — J9601 Acute respiratory failure with hypoxia: Secondary | ICD-10-CM | POA: Diagnosis not present

## 2020-01-18 DIAGNOSIS — K861 Other chronic pancreatitis: Secondary | ICD-10-CM | POA: Diagnosis not present

## 2020-01-18 DIAGNOSIS — N179 Acute kidney failure, unspecified: Secondary | ICD-10-CM | POA: Diagnosis not present

## 2020-01-18 LAB — CBC WITH DIFFERENTIAL/PLATELET
Abs Immature Granulocytes: 0.05 10*3/uL (ref 0.00–0.07)
Basophils Absolute: 0 10*3/uL (ref 0.0–0.1)
Basophils Relative: 0 %
Eosinophils Absolute: 0.1 10*3/uL (ref 0.0–0.5)
Eosinophils Relative: 0 %
HCT: 33.3 % — ABNORMAL LOW (ref 36.0–46.0)
Hemoglobin: 11.2 g/dL — ABNORMAL LOW (ref 12.0–15.0)
Immature Granulocytes: 0 %
Lymphocytes Relative: 11 %
Lymphs Abs: 1.7 10*3/uL (ref 0.7–4.0)
MCH: 30.9 pg (ref 26.0–34.0)
MCHC: 33.6 g/dL (ref 30.0–36.0)
MCV: 91.7 fL (ref 80.0–100.0)
Monocytes Absolute: 0.9 10*3/uL (ref 0.1–1.0)
Monocytes Relative: 6 %
Neutro Abs: 13.7 10*3/uL — ABNORMAL HIGH (ref 1.7–7.7)
Neutrophils Relative %: 83 %
Platelets: 53 10*3/uL — ABNORMAL LOW (ref 150–400)
RBC: 3.63 MIL/uL — ABNORMAL LOW (ref 3.87–5.11)
RDW: 16.3 % — ABNORMAL HIGH (ref 11.5–15.5)
WBC: 16.5 10*3/uL — ABNORMAL HIGH (ref 4.0–10.5)
nRBC: 2.7 % — ABNORMAL HIGH (ref 0.0–0.2)

## 2020-01-18 LAB — GLUCOSE, CAPILLARY
Glucose-Capillary: 102 mg/dL — ABNORMAL HIGH (ref 70–99)
Glucose-Capillary: 112 mg/dL — ABNORMAL HIGH (ref 70–99)
Glucose-Capillary: 126 mg/dL — ABNORMAL HIGH (ref 70–99)
Glucose-Capillary: 87 mg/dL (ref 70–99)
Glucose-Capillary: 89 mg/dL (ref 70–99)
Glucose-Capillary: 96 mg/dL (ref 70–99)
Glucose-Capillary: 98 mg/dL (ref 70–99)

## 2020-01-18 LAB — COMPREHENSIVE METABOLIC PANEL
ALT: 333 U/L — ABNORMAL HIGH (ref 0–44)
AST: 302 U/L — ABNORMAL HIGH (ref 15–41)
Albumin: 1.7 g/dL — ABNORMAL LOW (ref 3.5–5.0)
Alkaline Phosphatase: 61 U/L (ref 38–126)
Anion gap: 13 (ref 5–15)
BUN: 78 mg/dL — ABNORMAL HIGH (ref 8–23)
CO2: 24 mmol/L (ref 22–32)
Calcium: 7.4 mg/dL — ABNORMAL LOW (ref 8.9–10.3)
Chloride: 111 mmol/L (ref 98–111)
Creatinine, Ser: 3.27 mg/dL — ABNORMAL HIGH (ref 0.44–1.00)
GFR calc Af Amer: 16 mL/min — ABNORMAL LOW (ref >60)
GFR calc non Af Amer: 14 mL/min — ABNORMAL LOW (ref >60)
Glucose, Bld: 104 mg/dL — ABNORMAL HIGH (ref 70–99)
Potassium: 3.9 mmol/L (ref 3.5–5.1)
Sodium: 148 mmol/L — ABNORMAL HIGH (ref 135–145)
Total Bilirubin: 0.6 mg/dL (ref 0.3–1.2)
Total Protein: 4.2 g/dL — ABNORMAL LOW (ref 6.5–8.1)

## 2020-01-18 LAB — HEMOGLOBIN AND HEMATOCRIT, BLOOD
HCT: 33.2 % — ABNORMAL LOW (ref 36.0–46.0)
HCT: 34.7 % — ABNORMAL LOW (ref 36.0–46.0)
Hemoglobin: 11 g/dL — ABNORMAL LOW (ref 12.0–15.0)
Hemoglobin: 11.4 g/dL — ABNORMAL LOW (ref 12.0–15.0)

## 2020-01-18 MED ORDER — SODIUM CHLORIDE 0.9 % IV SOLN
1.0000 g | Freq: Two times a day (BID) | INTRAVENOUS | Status: DC
  Administered 2020-01-18: 1 g via INTRAVENOUS
  Filled 2020-01-18 (×3): qty 1000

## 2020-01-18 MED ORDER — ORAL CARE MOUTH RINSE
15.0000 mL | OROMUCOSAL | Status: DC
  Administered 2020-01-18 – 2020-01-20 (×19): 15 mL via OROMUCOSAL

## 2020-01-18 MED ORDER — POLYETHYLENE GLYCOL 3350 17 G PO PACK: 17.0000 g | PACK | Freq: Every day | ORAL | Status: DC | PRN

## 2020-01-18 MED ORDER — LORAZEPAM 2 MG/ML IJ SOLN
1.0000 mg | INTRAMUSCULAR | Status: DC | PRN
  Administered 2020-01-19: 2 mg via INTRAVENOUS
  Administered 2020-01-22: 1 mg via INTRAVENOUS
  Filled 2020-01-18 (×2): qty 1

## 2020-01-18 MED ORDER — OXYCODONE HCL 5 MG PO TABS: 5.0000 mg | ORAL_TABLET | Freq: Four times a day (QID) | ORAL | Status: DC

## 2020-01-18 MED ORDER — DOCUSATE SODIUM 50 MG/5ML PO LIQD
100.0000 mg | Freq: Two times a day (BID) | ORAL | Status: DC | PRN
  Filled 2020-01-18: qty 10

## 2020-01-18 MED ORDER — OXYCODONE HCL 5 MG/5ML PO SOLN
5.0000 mg | Freq: Four times a day (QID) | ORAL | Status: DC
  Administered 2020-01-18 – 2020-01-20 (×8): 5 mg
  Filled 2020-01-18 (×8): qty 5

## 2020-01-18 MED ORDER — CHLORHEXIDINE GLUCONATE 0.12% ORAL RINSE (MEDLINE KIT)
15.0000 mL | Freq: Two times a day (BID) | OROMUCOSAL | Status: DC
  Administered 2020-01-18 – 2020-01-20 (×4): 15 mL via OROMUCOSAL

## 2020-01-18 NOTE — Progress Notes (Signed)
Daily Rounding Note  01/18/2020, 3:28 PM  LOS: 5 days   SUBJECTIVE:   Chief complaint:     Radiology pulled sheath today.    Remains on vent No BM's reported.   OGT feedings currently at 20 mL/hour.    OBJECTIVE:         Vital signs in last 24 hours:    Temp:  [97.2 F (36.2 C)-100.2 F (37.9 C)] 99.1 F (37.3 C) (04/14 1500) Pulse Rate:  [63-97] 76 (04/14 1527) Resp:  [16-34] 18 (04/14 1527) BP: (65-160)/(44-131) 109/72 (04/14 1527) SpO2:  [96 %-100 %] 100 % (04/14 1527) FiO2 (%):  [30 %-40 %] 30 % (04/14 1527) Last BM Date: 01/18/20 Filed Weights   01/14/20 0108  Weight: 50.7 kg   General: intubated.  No response to name   Heart: RRR Chest: clear  Abdomen: quiet, pt winces with pressure throughout all 4 quadrants but no g/r  Extremities: no CCE.  Feet warm Neuro/Psych:  Unresponsive except to noxious stimuli.  No involuntary movements.    Intake/Output from previous day: 04/13 0701 - 04/14 0700 In: 2626.8 [I.V.:2262.3; NG/GT:154.7; IV Piggyback:209.8] Out: 1490 [Urine:1465; Emesis/NG output:25]  Intake/Output this shift: Total I/O In: 824.5 [I.V.:724.5; IV Piggyback:100] Out: 225 [Urine:225]  Lab Results: Recent Labs    01/17/20 0439 01/17/20 1007 01/17/20 1350 01/17/20 1350 01/17/20 2034 01/18/20 0334 01/18/20 0930  WBC 29.8*  --  24.2*  --   --  16.5*  --   HGB 10.5*   < > 11.0*   < > 11.1* 11.2* 11.0*  HCT 31.1*   < > 32.4*   < > 33.6* 33.3* 33.2*  PLT 56*  --  55*  --   --  53*  --    < > = values in this interval not displayed.   BMET Recent Labs    01/16/20 1812 01/17/20 0439 01/18/20 0334  NA 147* 148* 148*  K 5.1 4.5 3.9  CL 107 109 111  CO2 27 25 24   GLUCOSE 171* 88 104*  BUN 53* 59* 78*  CREATININE 3.22* 3.43* 3.27*  CALCIUM 7.1* 7.2* 7.4*   LFT Recent Labs    01/16/20 0504 01/17/20 0439 01/18/20 0334  PROT 4.0* 4.1* 4.2*  ALBUMIN 1.9* 1.7* 1.7*  AST 1,473*  648* 302*  ALT 658* 487* 333*  ALKPHOS 45 64 61  BILITOT 0.9 0.9 0.6  BILIDIR  --  0.3*  --   IBILI  --  0.6  --    PT/INR Recent Labs    01/17/20 0439  LABPROT 17.7*  INR 1.5*   Hepatitis Panel No results for input(s): HEPBSAG, HCVAB, HEPAIGM, HEPBIGM in the last 72 hours.  Studies/Results: DG Chest Port 1 View  Result Date: 01/17/2020 CLINICAL DATA:  69 year old female with history of acute respiratory failure. EXAM: PORTABLE CHEST 1 VIEW COMPARISON:  Chest x-ray 01/16/2020. FINDINGS: An endotracheal tube is in place with tip 3.4 cm above the carina. There is a right-sided internal jugular central venous catheter with tip terminating in the distal superior vena cava. Left-sided pacemaker device in place with lead tips projecting over the expected location of the right atrium and right ventricle. A nasogastric tube is seen extending into the stomach, however, the tip of the nasogastric tube extends below the lower margin of the image. Lung volumes are low. Bibasilar opacities are noted, favored to reflect areas of subsegmental atelectasis, although underlying airspace consolidation in the medial aspect  of the left lower lobe is not excluded. Small left pleural effusion. No definite right pleural effusion. No evidence of pulmonary edema. No pneumothorax. Several small calcified granulomas are noted in the lungs bilaterally. No other suspicious appearing pulmonary nodules or masses are noted. Heart size is normal. Upper mediastinal contours are within normal limits. Aortic atherosclerosis. IMPRESSION: 1. Support apparatus, as above. 2. Low lung volumes with probable bibasilar subsegmental atelectasis and small left pleural effusion. 3. Aortic atherosclerosis. Electronically Signed   By: Trudie Reed M.D.   On: 01/17/2020 06:29   Portable Chest x-ray  Result Date: 01/16/2020 CLINICAL DATA:  Intubation EXAM: PORTABLE CHEST 1 VIEW COMPARISON:  01/15/2020 FINDINGS: Interval placement of  esophagogastric tube, tip and side port below the diaphragm. Interval advancement of endotracheal tube, tip positioned above the carina. Right neck vascular catheter remains in position. Unchanged mild, diffuse interstitial opacity and possible small layering pleural effusions. No new or focal airspace opacity. IMPRESSION: 1. Interval placement of esophagogastric tube, tip and side port below the diaphragm. 2. Interval advancement of endotracheal tube, tip positioned above the carina. Electronically Signed   By: Lauralyn Primes M.D.   On: 01/16/2020 15:54    ASSESMENT:   *    Hematemesis, melena. 01/14/2020 EGD: Red blood, clots filling entire stomach, procedure terminated. 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 01/17/2020 EGD: Mild, nonbleeding esophagitis.  Small hiatal hernia.  Nonbleeding, clean-based gastric ulcer.  Congested, discolored gastric mucosa with decreased vascular pattern, ?  Evolving ischemia.  Non-bleeding erosive gastropathy.  45 minutes of lavage and suctioning of clotted blood in the stomach.  Pancreatico- gastrostomy site with inflammation and double-pigtail stent in place without bleeding.  Blood at ampulla, duodenal bulb and second duodenum cleared with suctioning.  *    Chronic alcoholic pancreatitis. PD strictures. Status post advanced endoscopy interventions at [facility]. Previous pancreatic duct sphincterotomy.   EUS/ERCP 1/22 with Dr. Edyth Gunnels at [facility].  Patent sphincterotomy, chronic pancreatitis, stents placed to PD.  01/12/2020 ERCP:  oppen biliary and pancreatic sphinterotomies w removal of occluded plastic stent, dilation of pancreatic duct and new plastic stent (#1) placed to PD.  *    Blood loss anemia Multiple FFP and PRBCs thus far.  Hgb stable at 11.     *   Thrombocytopenia.  Stable in 50s/  Liver unremarkable on CT of 01/14/20.  Hx Hep C, pt said this was treated (?? When, where).  Last HCV quat was 766.000 in 11/2016 and do not see a fup  level.    *   Encephalopathy and hypoxia requiring re-intubation on 4/12.  CCM holding off extubation until agitation resolves.  Currently requiring Precedex.    *    Chronic pain syndrome. Referral to pain mgt was in works along w suggestion for pt to see psych re her anxiety.    PLAN   *  Pt is to follow up at [facility] w Dr Jamse Mead or his surrogate.  Dr Meridee Score will not be assuming management of issues with pancreatic duct.   Should be seen within 10 days of dc from [facility] hospital.      Natasha Jacobson  01/18/2020, 3:28 PM Phone [phone]

## 2020-01-18 NOTE — Progress Notes (Signed)
PHARMACY NOTE:  ANTIMICROBIAL RENAL DOSAGE ADJUSTMENT  Current antimicrobial regimen includes a mismatch between antimicrobial dosage and estimated renal function.  As per policy approved by the Pharmacy & Therapeutics and Medical Executive Committees, the antimicrobial dosage will be adjusted accordingly.  Current antimicrobial dosage:  Ampicillin 1g IV Q8h   Indication: Enterococcal UTI  Renal Function:  Estimated Creatinine Clearance: 13.5 mL/min (A) (by C-G formula based on SCr of 3.27 mg/dL (H)). []      On intermittent HD, scheduled: []      On CRRT    Antimicrobial dosage has been changed to:  Ampicillin 1g IV Q12h  Additional comments: Pt continues to improve clinically. Stop date in place for 4 days of therapy 4/12>4/17   Thank you for allowing pharmacy to be a part of this patient's care.  , PharmD PGY1 Ambulatory Care Pharmacy Resident 01/18/2020 1:02 PM

## 2020-01-18 NOTE — Progress Notes (Signed)
Referring Physician(s): Dr. Charlsie Quest  Supervising Physician:  Dr. Vernard Gambles  Patient Status:  [facility] - In-pt  Chief Complaint: GI Bleed  Subjective:  S/p embolization of mid splenic artery 4/10. Remains intubated but awake. Repeat EGD yesterday. Hgb stable at 11.0 (R)groin sheath remains, not in use for a-line purposes.  Allergies: Buprenorphine and Aspirin  Medications:  Current Facility-Administered Medications:  .  0.9 %  sodium chloride infusion (Manually program via Guardrails IV Fluids), , Intravenous, Once, Armbruster, Carlota Raspberry, MD, Stopped at 01/14/20 1035 .  0.9 %  sodium chloride infusion (Manually program via Guardrails IV Fluids), , Intravenous, Once, Santos-Sanchez, Idalys, MD .  0.9 %  sodium chloride infusion (Manually program via Guardrails IV Fluids), , Intravenous, Once, Candee Furbish, MD .  0.9 %  sodium chloride infusion, , Intravenous, PRN, Candee Furbish, MD, Last Rate: 999 mL/hr at 01/14/20 1058, New Bag at 01/14/20 1058 .  0.9 %  sodium chloride infusion, , Intravenous, Continuous, Mansouraty, Telford Nab., MD .  0.9 %  sodium chloride infusion, 250 mL, Intravenous, Continuous, Mitzi Hansen, MD, Stopped at 01/18/20 [phone] .  albuterol (PROVENTIL) (2.5 MG/3ML) 0.083% nebulizer solution 2.5 mg, 2.5 mg, Inhalation, Q4H PRN, Rise Patience, MD, 2.5 mg at 01/16/20 0812 .  ampicillin (OMNIPEN) 1 g in sodium chloride 0.9 % 100 mL IVPB, 1 g, Intravenous, Q8H, Rigoberto Noel, MD, Stopped at 01/18/20 0707 .  Chlorhexidine Gluconate Cloth 2 % PADS 6 each, 6 each, Topical, Daily, Osei-Bonsu, Iona Beard, MD, 6 each at 01/18/20 [phone] .  dexmedetomidine (PRECEDEX) 400 MCG/100ML (4 mcg/mL) infusion, 0-1.2 mcg/kg/hr, Intravenous, Continuous, Neva Seat, MD, Last Rate: 15.21 mL/hr at 01/18/20 1000, 1.2 mcg/kg/hr at 01/18/20 1000 .  dextrose 10 % infusion, , Intravenous, Continuous, Neva Seat, MD, Last Rate: 30 mL/hr at 01/18/20 1000, Rate Verify at  01/18/20 1000 .  docusate (COLACE) 50 MG/5ML liquid 100 mg, 100 mg, Per Tube, BID PRN, Rigoberto Noel, MD .  feeding supplement (PRO-STAT SUGAR FREE 64) liquid 30 mL, 30 mL, Per Tube, TID, Candee Furbish, MD, 30 mL at 01/18/20 0809 .  feeding supplement (VITAL AF 1.2 CAL) liquid 1,000 mL, 1,000 mL, Per Tube, Continuous, Candee Furbish, MD, Last Rate: 20 mL/hr at 01/18/20 0300, 1,000 mL at 01/18/20 0300 .  fentaNYL (SUBLIMAZE) injection 25 mcg, 25 mcg, Intravenous, Q15 min PRN, Neva Seat, MD, 25 mcg at 01/16/20 1513 .  fentaNYL (SUBLIMAZE) injection 25-100 mcg, 25-100 mcg, Intravenous, Q30 min PRN, Neva Seat, MD, 100 mcg at 01/18/20 6160 .  hydrALAZINE (APRESOLINE) injection 10 mg, 10 mg, Intravenous, Q4H PRN, Rise Patience, MD .  insulin aspart (novoLOG) injection 0-6 Units, 0-6 Units, Subcutaneous, Q4H, Candee Furbish, MD, 1 Units at 01/15/20 0434 .  levothyroxine (SYNTHROID) tablet 50 mcg, 50 mcg, Per Tube, Q0600, Rigoberto Noel, MD, 50 mcg at 01/18/20 0808 .  LORazepam (ATIVAN) injection 1-2 mg, 1-2 mg, Intravenous, Q4H PRN, Rigoberto Noel, MD .  MEDLINE mouth rinse, 15 mL, Mouth Rinse, BID, Candee Furbish, MD, 15 mL at 01/18/20 7371 .  ondansetron (ZOFRAN) tablet 4 mg, 4 mg, Oral, Q6H PRN **OR** ondansetron (ZOFRAN) injection 4 mg, 4 mg, Intravenous, Q6H PRN, Rise Patience, MD .  oxyCODONE (ROXICODONE) 5 MG/5ML solution 5 mg, 5 mg, Per Tube, Q6H, Rigoberto Noel, MD, 5 mg at 01/18/20 1013 .  pantoprazole (PROTONIX) injection 40 mg, 40 mg, Intravenous, Q12H, Candee Furbish, MD, 40 mg at 01/18/20  0809 .  phenylephrine (NEOSYNEPHRINE) 10-0.9 MG/250ML-% infusion, 0-400 mcg/min, Intravenous, Titrated, Oretha Milch, MD, Last Rate: 45 mL/hr at 01/18/20 1000, 30 mcg/min at 01/18/20 1000 .  polyethylene glycol (MIRALAX / GLYCOLAX) packet 17 g, 17 g, Per Tube, Daily PRN, Cyril Mourning V, MD .  sodium chloride flush (NS) 0.9 % injection 10-40 mL, 10-40 mL, Intracatheter,  Q12H, Lorin Glass, MD, 10 mL at 01/18/20 0801 .  sodium chloride flush (NS) 0.9 % injection 10-40 mL, 10-40 mL, Intracatheter, PRN, Lorin Glass, MD    Vital Signs: BP 122/72   Pulse 68   Temp 99.1 F (37.3 C)   Resp (!) 22   Ht 5\' 4"  (1.626 m)   Wt 50.7 kg   SpO2 100%   BMI 19.19 kg/m   Physical Exam  Intubated. Abdomen: soft, non-distended. No BRBPR.  Groin: R groin sheath in place.  Soft.   Imaging:   IR EMBO ART  VEN HEMORR LYMPH EXTRAV  INC GUIDE ROADMAPPING  Result Date: 01/15/2020 CLINICAL DATA:  GI bleed. 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 EXAM: SELECTIVE VISCERAL ARTERIOGRAPHY; ADDITIONAL ARTERIOGRAPHY; IR ULTRASOUND GUIDANCE VASC ACCESS RIGHT; IR EMBO ART VEN HEMORR LYMPH EXTRAV INC GUIDE ROADMAPPING ANESTHESIA/SEDATION: Patient was intubated and receiving IV fentanyl MEDICATIONS: Lidocaine 1% subcutaneous CONTRAST:  Omnipaque 300 IA PROCEDURE: The procedure was performed with implied emergent consent. Right femoral region prepped and draped in usual sterile fashion. Maximal barrier sterile technique was utilized including caps, mask, sterile gowns, sterile gloves, sterile drape, hand hygiene and skin antiseptic. The right common femoral artery was localized under ultrasound. Under real-time ultrasound guidance, the vessel was accessed with a 21-gauge micropuncture needle, exchanged over a 018 guidewire for a transitional dilator, through which a 035 guidewire was advanced. Over this, a 5 03/16/2020 vascular sheath was placed, through which a 5 [place] C2 catheter was advanced and used to selectively catheterize the celiac axis for selective arteriography. This was exchanged for a 5 [place] Sos catheter. Through this, a coaxial microcatheter with 016 guidewire were advanced across the lesion in the mid splenic artery. Coil embolization immediately distal to the lesion performed with 2, 3, and 4 mm interlock coils. Separately, 5 mm coils x2  placed in the pseudoaneurysm. Separately, 2, 3, and 4 mm interlock coils were used in the native splenic artery immediately proximal to the lesion. Hemostasis of flow was achieved. Follow-up arteriogram was performed. The catheter was removed. Because of coagulopathy, sheath was secured with 0 silk suture and placed to flush system. The patient tolerated the procedure well. 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 Technically successful coil embolization of the splenic artery immediately distal and proximal to the lesion. IMPRESSION: 1. 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 Electronically Signed   By: [place] M.D.   On: 01/15/2020 09:49    Labs:  CBC: Recent Labs    01/16/20 0554 01/16/20 1023 01/17/20 0439 01/17/20 1007 01/17/20 1350 01/17/20 2034 01/18/20 0334 01/18/20 0930  WBC 39.3*  --  29.8*  --  24.2*  --  16.5*  --   HGB 12.0   < > 10.5*   < > 11.0* 11.1* 11.2* 11.0*  HCT 33.9*   < > 31.1*   < > 32.4* 33.6* 33.3* 33.2*  PLT 57*  --  56*  --  55*  --  53*  --    < > =  values in this interval not displayed.    COAGS: Recent Labs    01/14/20 1042 01/15/20 1013 01/17/20 0439  INR 3.5* 1.5* 1.5*    BMP: Recent Labs    01/16/20 0504 01/16/20 0504 01/16/20 1030 01/16/20 1812 01/17/20 0439 01/18/20 0334  NA 147*   < > 144 147* 148* 148*  K 5.1   < > 5.2* 5.1 4.5 3.9  CL 107  --   --  107 109 111  CO2 27  --   --  27 25 24   GLUCOSE 159*  --   --  171* 88 104*  BUN 48*  --   --  53* 59* 78*  CALCIUM 7.5*  --   --  7.1* 7.2* 7.4*  CREATININE 3.04*  --   --  3.22* 3.43* 3.27*  GFRNONAA 15*  --   --  14* 13* 14*  GFRAA 18*  --   --  17* 15* 16*   < > = values in this interval not displayed.    LIVER FUNCTION TESTS: Recent Labs    01/14/20 1042 01/16/20 0504 01/17/20 0439  01/18/20 0334  BILITOT 0.3 0.9 0.9 0.6  AST 214* 1,473* 648* 302*  ALT 124* 658* 487* 333*  ALKPHOS 50 45 64 61  PROT 3.1* 4.0* 4.1* 4.2*  ALBUMIN 1.6* 1.9* 1.7* 1.7*    Assessment and Plan: GI bleed s/p embolization of mid splenic artery 4/10 WBC trending down to 16.5 HgB stable at 11.0 No further bleeding noted on exam today, per RN passing old blood.  Groin site intact.  Sheath remains in place. Her INR is 1.5.  Will plan sheath removal today.  Electronically Signed: 01/20/20, PA-C 01/18/2020, 10:47 AM   I spent a total of 15 Minutes at the the patient's bedside AND on the patient's hospital floor or unit, greater than 50% of which was counseling/coordinating care for GI bleed.

## 2020-01-18 NOTE — Progress Notes (Signed)
Right side 5FR sheath pulled at 1124.  Quick clot applied with manual pressure.  Complete hemostasis achieved at 1138.    Gauze and tegaderm applied over quick clot with no sign of hematoma.  Dressing showed to RN Alice and advised to remove quick clot within 24 hours.  Distal pulses palpable.   [employer]. RTR Orlene Erm,  RTR

## 2020-01-18 NOTE — Progress Notes (Signed)
NAME:  Natasha Jacobson, MRN:  [id number], DOB:  December 27, 1950, LOS: 5 ADMISSION DATE:  01/13/2020, CONSULTATION DATE:  01/14/20 REFERRING MD:  Adela Lank, CHIEF COMPLAINT:  AMS   Brief History   69 year old woman with a hx of alcoholic pancreatitis s/p recent exchange of pancreaticogastrostomy stent at [facility] (4/8) who presented to the ED on 4/9 with hematemesis. On 4/10 she developed hematochezia, encephalopathy, and hypotension. She was transferred to the ICU and taken for emergent EGD that illustrated active upper GI hemorrhage with inadequate visualization for source control. 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 She was subsequently take for selective splenic arteriogram and coiled embolization of the pseudoaneurysm (4/10).   Past Medical History  Alcohol use disorder, chronic pancreatitis, COPD, CAD, HLD, HTN, MI, sick sinus syndrome  Significant Hospital Events   4/9 admitted; mass transfusion protocol 4/10 transfer to ICU; EGD, IR embolization 4/11 extubated 4/12 white count remains elevated at 39k, LFTs significantly elevated; continues to have dark red stools; hgb stable; minimal responsiveness; reintubated for increased work of breathing 4/13 LFTs improving. hgb stable. More responsive this morning. Minimal improvement in respiratory status. Increased BRBPR>>EGD 4/14 LFTs, renal function improving. More encephalopathic this morning. hgb stable.  Consults:  GI IR Procedures:  4/10 Intubation>>4/11 4/10 central line>>  4/12 ETT>>  Significant Diagnostic Tests:  CT A/P 01/13/20>>Mild peripancreatic haziness may represent acute on chronic pancreatitis. A pancreatic stent with pigtail ends in the second portion of the duodenum and in the body of the stomach noted. No peripancreatic fluid collection or pseudocyst. Bilateral renal parenchyma  wedge-shaped infarcts, new since the prior CT.   4/9 CTA a/p>>15mm pseudoaneurysm over a pancreatic branch of the celiac. Evidence of acute hemorrhage.  -Patient bilateral renal arteries with focal narrowing of the proximal 1cm of right renal artery. -interval distension of the stomach/duodenum likely 2/2 hemorrhage. Dilated proximal small bowel loops. Possible penumatosis involving descending colon  -worsening patchy bilateral low attenuation areas over the renal cortex which may be due to infarction -worsening free peritoneal fluid  4/13 CXR>>bibasilar atelectasis and small left effusion  4/13 EGD>>significant amount of clotted blood present in stomach. Superficial gastric ulcers. Diffuse moderate mucosal changes characterized by congestion, discoloration and decreased vascular pattern in the fundus and gastric body with questionable ischemia. No bleeding source apparent  Micro Data:  4/9 Urine culture>> ++ e.faecalis 4/9 COVID Neg  Antimicrobials:  4/9 Zosyn x 1 4/10 Ceftriaxone >> 4/12 Ampicillin 4/12>>  Interim history/subjective:  Increased BRBPR last afternoon. hgb checked and stable. Repeat EGD showing questionable gastric ischemia and significant amount of clotted blood in the stomach however no source of bleed present Increased pressor support last evening however trending down again.  Objective   Blood pressure 105/63, pulse 80, temperature 99 F (37.2 C), resp. rate (!) 23, height 5\' 4"  (1.626 m), weight 50.7 kg, SpO2 100 %.    Vent Mode: PRVC FiO2 (%):  [30 %-40 %] 30 % Set Rate:  [20 bmp] 20 bmp Vt Set:  [440 mL] 440 mL PEEP:  [5 cmH20] 5 cmH20 Pressure Support:  [5 cmH20-10 cmH20] 10 cmH20 Plateau Pressure:  [13 cmH20-16 cmH20] 13 cmH20   Intake/Output Summary (Last 24 hours) at 01/18/2020 0542 Last data filed at 01/18/2020 0400 Gross per 24 hour  Intake 2557.21 ml  Output 1520 ml  Net 1037.21 ml  Filed Weights   01/14/20 0108  Weight: 50.7 kg    Examination: General: critically ill appearing female HENT: ETT Pulm: mechanical breath sounds. No wheezes appreciated on exam today CV: tachycardic rate. No peripheral edema Abdomen: abd distended and firm. Pain with palpation of all quadrants. Extremities: warm Skin: no rash Neuro: awake but not following commands. Severely aggitated and thrashing around in bed. Not responding to questions  Resolved Hospital Problem list   Hemorrhagic shock 2/2 bleeding 20mm pseudoaneurysm and recent instrumentation s/p EGD and coil embolization by IR 4/10  Assessment & Plan:   Shock A: continues to require pressor support. Suspect this is at least partly attributable to sedation as pressures increase when more awake however is continuing to require sedation given her significant encephalopathy. Liver shock. A:Down trending this morning; AST 1473>648, ALT 658>487. Bili remains normal. Coags down trending as well which is reassuring INR 3.5>>1.5 Likely due to hemorrhagic shock as they are now improving since resolution of hemorrhaging.  P: will continue to work on weaning neo. Hoping that, as encephalopathy/sedation improves, will be able to work on coming off pressor support    GI bleed>splenic pseudoaneurysm>recent instrumentation for pancreatic stent  Acute blood loss anemia A: increased BRBPR yesterday however hgb remained stable. GI was called and repeat EGD did not show source of bleed however there was a significant amount of clotted blood in the stomach.  hgb remaining stable this morning. Bleeding source would likely be aided with CTA however would likely cause further damage to her renal function at this time Continued to pass bloody bowel movements overnight P: continue to monitor hemoglobin. Would consider getting her transferred to [facility] if she continues to have bleeding.  Thrombocytopenia.  A: remains severe but stable Leukocytosis A: white count continuing to trend down;  40k>30k>16.5. one febrile episode yesterday otherwise temps ranging in the 99Fs P: will continue to monitor  Acute hypoxemic respiratory failure requiring mechanical ventilation reintubated 4/12 for increased work of breathing.  A: minimal vent settings this morning. Mental status barring extubation P: continue on ventilator until mental status improves. daily SBTs, VAP bundling  AKI 2/2 ATN A: Slight improvement this morning. 7.65>4.65 Metabolic alkalosis. Possibly contraction alkalosis Hypernatremia. stable Hyperphosphatemia  P: continue close monitoring of her renal function  E. Faecalis urinary tract infection.  P: continue ampicillin day #3/5  Acute encephalopathy. Metabolic vs sedation vs central A: seems worse this morning. Sedation and ICU delerium likely playing a role but given the gastric ischemia on yesterday's, I do question if there is some brain ischemia involved as well. Additionally, she may have been receiving suboxone outpatient which brings into question a degree of withdrawal as she was only getting fentanyl prn P: continue precedex with close monitoring of her MAPs as her pressures are soft. wean sedation as able. Will start her on scheduled oxycodone to help with withdrawal.   High risk Malnutrition. Continue trickle feeds  Hypothyroidism. Continue synthroid.  Best practice:  Diet: NPO Pain/Anxiety/Delirium protocol (if indicated): precedex, prn fentanyl, scheduled oxy VAP protocol (if indicated): yes DVT prophylaxis: SCDs GI prophylaxis: PPI bid Glucose control: SSI Mobility: BR Code Status: Full Family Communication: daughter Disposition: ICU  Labs   CBC: Recent Labs  Lab 01/13/20 1705 01/13/20 1705 01/14/20 1042 01/14/20 1348 01/16/20 0554 01/16/20 1023 01/17/20 0439 01/17/20 1007 01/17/20 1350 01/17/20 2034 01/18/20 0334  WBC 17.9*   < > 38.0*  --  39.3*  --  29.8*  --  24.2*  --  16.5*  NEUTROABS 13.6*  --  29.4*  --   --   --   --    --   --   --  13.7*  HGB 8.4*   < > 3.9*   < > 12.0   < > 10.5* 10.9* 11.0* 11.1* 11.2*  HCT 26.3*   < > 13.6*   < > 33.9*   < > 31.1* 32.5* 32.4* 33.6* 33.3*  MCV 94.3   < > 111.5*  --  86.7  --  90.1  --  91.0  --  91.7  PLT 293   < > 189  --  57*  --  56*  --  55*  --  53*   < > = values in this interval not displayed.    Basic Metabolic Panel: Recent Labs  Lab 01/14/20 2140 01/15/20 0833 01/15/20 0837 01/15/20 0837 01/16/20 0504 01/16/20 1030 01/16/20 1812 01/17/20 0439 01/18/20 0334  NA  --    < > 148*   < > 147* 144 147* 148* 148*  K  --    < > 2.9*   < > 5.1 5.2* 5.1 4.5 3.9  CL  --   --  102  --  107  --  107 109 111  CO2  --   --  30  --  27  --  27 25 24   GLUCOSE  --   --  134*  --  159*  --  171* 88 104*  BUN  --   --  39*  --  48*  --  53* 59* 78*  CREATININE  --   --  2.66*  --  3.04*  --  3.22* 3.43* 3.27*  CALCIUM  --   --  7.5*  --  7.5*  --  7.1* 7.2* 7.4*  MG 1.7  --   --   --   --   --   --  2.0  --   PHOS  --   --   --   --   --   --   --  6.4*  --    < > = values in this interval not displayed.   GFR: Estimated Creatinine Clearance: 13.5 mL/min (A) (by C-G formula based on SCr of 3.27 mg/dL (H)). Recent Labs  Lab 01/16/20 0554 01/17/20 0439 01/17/20 1350 01/18/20 0334  WBC 39.3* 29.8* 24.2* 16.5*    Liver Function Tests: Recent Labs  Lab 01/13/20 1705 01/14/20 1042 01/16/20 0504 01/17/20 0439 01/18/20 0334  AST 21 214* 1,473* 648* 302*  ALT 10 124* 658* 487* 333*  ALKPHOS 39 50 45 64 61  BILITOT 0.4 0.3 0.9 0.9 0.6  PROT 5.4* 3.1* 4.0* 4.1* 4.2*  ALBUMIN 2.8* 1.6* 1.9* 1.7* 1.7*   Recent Labs  Lab 01/13/20 1705  LIPASE 256*   Recent Labs  Lab 01/16/20 1023  AMMONIA 35    ABG    Component Value Date/Time   PHART 7.506 (H) 01/16/2020 1030   PCO2ART 35.3 01/16/2020 1030   PO2ART 48.0 (L) 01/16/2020 1030   HCO3 28.2 (H) 01/16/2020 1030   TCO2 29 01/16/2020 1030   ACIDBASEDEF 7.0 (H) 01/14/2020 1944   O2SAT 89.0 01/16/2020  1030     Coagulation Profile: Recent Labs  Lab 01/14/20 1042 01/15/20 1013 01/17/20 0439  INR 3.5* 1.5* 1.5*    Cardiac Enzymes: No results for input(s): CKTOTAL, CKMB, CKMBINDEX, TROPONINI in the last 168 hours.  HbA1C: Hgb A1c MFr Bld  Date/Time  Value Ref Range Status  01/14/2020 09:40 PM 5.4 4.8 - 5.6 % Final    Comment:    (NOTE)         Prediabetes: 5.7 - 6.4         Diabetes: >6.4         Glycemic control for adults with diabetes: <7.0     CBG: Recent Labs  Lab 01/17/20 1657 01/17/20 2026 01/17/20 2351 01/18/20 0009 01/18/20 0344  GLUCAP 73 71 58* 87 89

## 2020-01-19 ENCOUNTER — Inpatient Hospital Stay (HOSPITAL_COMMUNITY): Payer: Medicare Other

## 2020-01-19 DIAGNOSIS — N179 Acute kidney failure, unspecified: Secondary | ICD-10-CM | POA: Diagnosis not present

## 2020-01-19 DIAGNOSIS — J9601 Acute respiratory failure with hypoxia: Secondary | ICD-10-CM | POA: Diagnosis not present

## 2020-01-19 DIAGNOSIS — G9340 Encephalopathy, unspecified: Secondary | ICD-10-CM | POA: Diagnosis not present

## 2020-01-19 LAB — GLUCOSE, CAPILLARY
Glucose-Capillary: 107 mg/dL — ABNORMAL HIGH (ref 70–99)
Glucose-Capillary: 108 mg/dL — ABNORMAL HIGH (ref 70–99)
Glucose-Capillary: 127 mg/dL — ABNORMAL HIGH (ref 70–99)
Glucose-Capillary: 127 mg/dL — ABNORMAL HIGH (ref 70–99)
Glucose-Capillary: 132 mg/dL — ABNORMAL HIGH (ref 70–99)
Glucose-Capillary: 99 mg/dL (ref 70–99)

## 2020-01-19 LAB — COMPREHENSIVE METABOLIC PANEL
ALT: 201 U/L — ABNORMAL HIGH (ref 0–44)
AST: 100 U/L — ABNORMAL HIGH (ref 15–41)
Albumin: 1.6 g/dL — ABNORMAL LOW (ref 3.5–5.0)
Alkaline Phosphatase: 55 U/L (ref 38–126)
Anion gap: 13 (ref 5–15)
BUN: 106 mg/dL — ABNORMAL HIGH (ref 8–23)
CO2: 23 mmol/L (ref 22–32)
Calcium: 8.2 mg/dL — ABNORMAL LOW (ref 8.9–10.3)
Chloride: 115 mmol/L — ABNORMAL HIGH (ref 98–111)
Creatinine, Ser: 3.24 mg/dL — ABNORMAL HIGH (ref 0.44–1.00)
GFR calc Af Amer: 16 mL/min — ABNORMAL LOW (ref >60)
GFR calc non Af Amer: 14 mL/min — ABNORMAL LOW (ref >60)
Glucose, Bld: 131 mg/dL — ABNORMAL HIGH (ref 70–99)
Potassium: 3.6 mmol/L (ref 3.5–5.1)
Sodium: 151 mmol/L — ABNORMAL HIGH (ref 135–145)
Total Bilirubin: 1.1 mg/dL (ref 0.3–1.2)
Total Protein: 4.1 g/dL — ABNORMAL LOW (ref 6.5–8.1)

## 2020-01-19 LAB — CBC
HCT: 36 % (ref 36.0–46.0)
Hemoglobin: 11.7 g/dL — ABNORMAL LOW (ref 12.0–15.0)
MCH: 30.2 pg (ref 26.0–34.0)
MCHC: 32.5 g/dL (ref 30.0–36.0)
MCV: 93 fL (ref 80.0–100.0)
Platelets: 80 10*3/uL — ABNORMAL LOW (ref 150–400)
RBC: 3.87 MIL/uL (ref 3.87–5.11)
RDW: 16.3 % — ABNORMAL HIGH (ref 11.5–15.5)
WBC: 18.6 10*3/uL — ABNORMAL HIGH (ref 4.0–10.5)
nRBC: 3.1 % — ABNORMAL HIGH (ref 0.0–0.2)

## 2020-01-19 LAB — CULTURE, BLOOD (ROUTINE X 2)
Culture: NO GROWTH
Special Requests: ADEQUATE

## 2020-01-19 LAB — HEMOGLOBIN AND HEMATOCRIT, BLOOD
HCT: 35.6 % — ABNORMAL LOW (ref 36.0–46.0)
HCT: 35.5 % — ABNORMAL LOW (ref 36.0–46.0)
Hemoglobin: 11.8 g/dL — ABNORMAL LOW (ref 12.0–15.0)
Hemoglobin: 11.6 g/dL — ABNORMAL LOW (ref 12.0–15.0)

## 2020-01-19 MED ORDER — LACTULOSE 10 GM/15ML PO SOLN: 20.0000 g | Freq: Every day | ORAL | Status: DC

## 2020-01-19 MED ORDER — LORAZEPAM 2 MG/ML IJ SOLN
1.0000 mg | Freq: Three times a day (TID) | INTRAMUSCULAR | Status: DC
  Administered 2020-01-19 – 2020-01-20 (×4): 1 mg via INTRAVENOUS
  Filled 2020-01-19 (×4): qty 1

## 2020-01-19 MED ORDER — FREE WATER
200.0000 mL | Status: DC
  Administered 2020-01-19 – 2020-01-23 (×25): 200 mL

## 2020-01-19 MED ORDER — SODIUM CHLORIDE 0.9 % IV SOLN
2.0000 g | Freq: Two times a day (BID) | INTRAVENOUS | Status: AC
  Administered 2020-01-19 – 2020-01-21 (×6): 2 g via INTRAVENOUS
  Filled 2020-01-19 (×3): qty 2000
  Filled 2020-01-19: qty 2
  Filled 2020-01-19 (×3): qty 2000

## 2020-01-19 NOTE — Progress Notes (Signed)
Referring Physician(s): Natasha Jacobson  Supervising Physician: Natasha Jacobson  Patient Status:  [name] - In-pt  Chief Complaint: GI bleed   Subjective: Pt remains intubated; continues to have some rectal bleeding (maroon colored); AMS- for CT head today; hgb 11.8(11.4)   Allergies: Buprenorphine and Aspirin  Medications: Prior to Admission medications   Medication Sig Start Date End Date Taking? Authorizing Provider  acetaminophen (TYLENOL) 500 MG tablet Take 2 tablets by mouth every 8 (eight) hours as needed. 11/01/19   [provider]  albuterol (VENTOLIN HFA) 108 (90 Base) MCG/ACT inhaler Inhale 2 puffs into the lungs every 4 (four) hours as needed. 11/24/18   [provider]  amLODipine (NORVASC) 5 MG tablet Take 1 tablet by mouth daily. 09/22/19   [provider]  baclofen (LIORESAL) 10 MG tablet Take 1 tablet by mouth daily as needed. 11/11/19   [provider]  ergocalciferol (VITAMIN D2) 1.25 MG (50000 UT) capsule Take 1 capsule by mouth once a week. 11/18/19   [provider]  fenofibrate (TRICOR) 48 MG tablet Take 1 tablet by mouth daily. 05/13/18   [provider]  gabapentin (NEURONTIN) 300 MG capsule Take 1 capsule by mouth 3 (three) times daily. 11/11/19 11/10/20  [provider]  levothyroxine (SYNTHROID) 50 MCG tablet Take 50 mcg by mouth daily before breakfast.    [provider]  Melatonin 5 MG CAPS Take 2 capsules by mouth at bedtime.    [provider]  Oxycodone HCl 10 MG TABS Take 10 mg by mouth every 6 (six) hours as needed for pain. 01/12/20   [provider]  Pancrelipase, Lip-Prot-Amyl, (CREON) 24000-76000 units CPEP Take 1 capsule by mouth 3 (three) times daily. 08/18/19   [provider]  pantoprazole (PROTONIX) 40 MG tablet Take 40 mg by mouth daily.    [provider]  polyethylene glycol powder (GLYCOLAX/MIRALAX) 17 GM/SCOOP powder Take 17 g by mouth as needed.     [provider]  senna (SENOKOT) 8.6 MG TABS tablet Take 1 tablet by mouth as needed for mild constipation.    [provider]  Tiotropium Bromide Monohydrate (SPIRIVA RESPIMAT) 1.25 MCG/ACT AERS Inhale 2 puffs into the lungs daily. 11/15/19 11/14/20  [provider]     Vital Signs: BP 101/65   Pulse 76   Temp 99.1 F (37.3 C)   Resp (!) 23   Ht 5\' 4"  (1.626 m)   Wt 111 lb 12.8 oz (50.7 kg)   SpO2 100%   BMI 19.19 kg/m   Physical Exam intubated; opens eyes; rt groin access site soft,no hematoma, bandage in place  Imaging: IR Angiogram Visceral Selective  Result Date: 01/15/2020 CLINICAL DATA:  GI bleed. 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 EXAM: SELECTIVE VISCERAL ARTERIOGRAPHY; ADDITIONAL ARTERIOGRAPHY; IR ULTRASOUND GUIDANCE VASC ACCESS RIGHT; IR EMBO ART VEN HEMORR LYMPH EXTRAV INC GUIDE ROADMAPPING ANESTHESIA/SEDATION: Patient was intubated and receiving IV fentanyl MEDICATIONS: Lidocaine 1% subcutaneous CONTRAST:  Omnipaque 300 IA PROCEDURE: The procedure was performed with implied emergent consent. Right femoral region prepped and draped in usual sterile fashion. Maximal barrier sterile technique was utilized including caps, mask, sterile gowns, sterile gloves, sterile drape, hand hygiene and skin antiseptic. The right common femoral artery was localized under ultrasound. Under real-time ultrasound guidance, the vessel was accessed with a 21-gauge micropuncture needle, exchanged over a 018 guidewire for a transitional dilator, through which a 035 guidewire was advanced. Over this, a 5 03/16/2020 vascular sheath was placed, through  which a 5 French C2 catheter was advanced and used to selectively catheterize the celiac axis for selective arteriography. This was exchanged for a 5 [place] Sos catheter. Through this, a coaxial microcatheter with 016 guidewire were advanced across the lesion in the mid splenic artery. Coil embolization  immediately distal to the lesion performed with 2, 3, and 4 mm interlock coils. Separately, 5 mm coils x2 placed in the pseudoaneurysm. Separately, 2, 3, and 4 mm interlock coils were used in the native splenic artery immediately proximal to the lesion. Hemostasis of flow was achieved. Follow-up arteriogram was performed. The catheter was removed. Because of coagulopathy, sheath was secured with 0 silk suture and placed to flush system. The patient tolerated the procedure well. 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 Technically successful coil embolization of the splenic artery immediately distal and proximal to the lesion. IMPRESSION: 1. 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 Electronically Signed   By: Corlis Leak M.D.   On: 01/15/2020 09:49   IR Angiogram Selective Each Additional Vessel  Result Date: 01/15/2020 CLINICAL DATA:  GI bleed. 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 EXAM: SELECTIVE VISCERAL ARTERIOGRAPHY; ADDITIONAL ARTERIOGRAPHY; IR ULTRASOUND GUIDANCE VASC ACCESS RIGHT; IR EMBO ART VEN HEMORR LYMPH EXTRAV INC GUIDE ROADMAPPING ANESTHESIA/SEDATION: Patient was intubated and receiving IV fentanyl MEDICATIONS: Lidocaine 1% subcutaneous CONTRAST:  Omnipaque 300 IA PROCEDURE: The procedure was performed with implied emergent consent. Right femoral region prepped and draped in usual sterile fashion. Maximal barrier sterile technique was utilized including caps, mask, sterile gowns, sterile gloves, sterile drape, hand hygiene and skin antiseptic. The right common femoral artery was localized under ultrasound. Under real-time ultrasound guidance, the vessel was accessed with a 21-gauge micropuncture needle, exchanged over a 018 guidewire for a transitional dilator, through which a 035 guidewire was  advanced. Over this, a 5 [place] vascular sheath was placed, through which a 5 [place] C2 catheter was advanced and used to selectively catheterize the celiac axis for selective arteriography. This was exchanged for a 5 [place] Sos catheter. Through this, a coaxial microcatheter with 016 guidewire were advanced across the lesion in the mid splenic artery. Coil embolization immediately distal to the lesion performed with 2, 3, and 4 mm interlock coils. Separately, 5 mm coils x2 placed in the pseudoaneurysm. Separately, 2, 3, and 4 mm interlock coils were used in the native splenic artery immediately proximal to the lesion. Hemostasis of flow was achieved. Follow-up arteriogram was performed. The catheter was removed. Because of coagulopathy, sheath was secured with 0 silk suture and placed to flush system. The patient tolerated the procedure well. 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 Technically successful coil embolization of the splenic artery immediately distal and proximal to the lesion. IMPRESSION: 1. 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 Electronically Signed   By: Corlis Leak M.D.   On: 01/15/2020 09:49   IR US Guide Vasc Access Right  Result Date: 01/15/2020 CLINICAL DATA:  GI bleed. 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 EXAM: SELECTIVE VISCERAL ARTERIOGRAPHY; ADDITIONAL ARTERIOGRAPHY; IR ULTRASOUND GUIDANCE VASC ACCESS RIGHT; IR EMBO ART VEN HEMORR LYMPH EXTRAV INC GUIDE ROADMAPPING ANESTHESIA/SEDATION: Patient was intubated and receiving IV fentanyl MEDICATIONS: Lidocaine 1% subcutaneous CONTRAST:  Omnipaque 300 IA PROCEDURE: The procedure was performed  with implied emergent consent. Right femoral region prepped and draped in usual sterile fashion. Maximal barrier sterile technique was utilized  including caps, mask, sterile gowns, sterile gloves, sterile drape, hand hygiene and skin antiseptic. The right common femoral artery was localized under ultrasound. Under real-time ultrasound guidance, the vessel was accessed with a 21-gauge micropuncture needle, exchanged over a 018 guidewire for a transitional dilator, through which a 035 guidewire was advanced. Over this, a 5 [place] vascular sheath was placed, through which a 5 [place] C2 catheter was advanced and used to selectively catheterize the celiac axis for selective arteriography. This was exchanged for a 5 [place] Sos catheter. Through this, a coaxial microcatheter with 016 guidewire were advanced across the lesion in the mid splenic artery. Coil embolization immediately distal to the lesion performed with 2, 3, and 4 mm interlock coils. Separately, 5 mm coils x2 placed in the pseudoaneurysm. Separately, 2, 3, and 4 mm interlock coils were used in the native splenic artery immediately proximal to the lesion. Hemostasis of flow was achieved. Follow-up arteriogram was performed. The catheter was removed. Because of coagulopathy, sheath was secured with 0 silk suture and placed to flush system. The patient tolerated the procedure well. 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 Technically successful coil embolization of the splenic artery immediately distal and proximal to the lesion. IMPRESSION: 1. 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 Electronically Signed   By: Corlis Leak  Hassell M.D.   On: 01/15/2020 09:49   DG CHEST PORT 1 VIEW  Result Date: 01/19/2020 CLINICAL DATA:  Altered mental status. Bilateral lower extremity swelling. EXAM: PORTABLE CHEST 1 VIEW COMPARISON:  January 17, 2020. FINDINGS: The heart size and mediastinal contours are within normal limits. Endotracheal and  nasogastric tubes are unchanged in position. Right internal jugular catheter is unchanged. Left-sided pacemaker is noted. Mild bibasilar subsegmental atelectasis is noted with possible small left pleural effusion. The visualized skeletal structures are unremarkable. IMPRESSION: Stable support apparatus. Mild bibasilar subsegmental atelectasis is noted with possible small left pleural effusion. Aortic Atherosclerosis (ICD10-I70.0). Electronically Signed   By: Lupita RaiderJames  Green Jr M.D.   On: 01/19/2020 10:17   DG Chest Port 1 View  Result Date: 01/17/2020 CLINICAL DATA:  69 year old female with history of acute respiratory failure. EXAM: PORTABLE CHEST 1 VIEW COMPARISON:  Chest x-ray 01/16/2020. FINDINGS: An endotracheal tube is in place with tip 3.4 cm above the carina. There is a right-sided internal jugular central venous catheter with tip terminating in the distal superior vena cava. Left-sided pacemaker device in place with lead tips projecting over the expected location of the right atrium and right ventricle. A nasogastric tube is seen extending into the stomach, however, the tip of the nasogastric tube extends below the lower margin of the image. Lung volumes are low. Bibasilar opacities are noted, favored to reflect areas of subsegmental atelectasis, although underlying airspace consolidation in the medial aspect of the left lower lobe is not excluded. Small left pleural effusion. No definite right pleural effusion. No evidence of pulmonary edema. No pneumothorax. Several small calcified granulomas are noted in the lungs bilaterally. No other suspicious appearing pulmonary nodules or masses are noted. Heart size is normal. Upper mediastinal contours are within normal limits. Aortic atherosclerosis. IMPRESSION: 1. Support apparatus, as above. 2. Low lung volumes with probable bibasilar subsegmental atelectasis and small left pleural effusion. 3. Aortic atherosclerosis. Electronically Signed  By: Vinnie Langton M.D.   On: 01/17/2020 06:29   Portable Chest x-ray  Result Date: 01/16/2020 CLINICAL DATA:  Intubation EXAM: PORTABLE CHEST 1 VIEW COMPARISON:  01/15/2020 FINDINGS: Interval placement of esophagogastric tube, tip and side port below the diaphragm. Interval advancement of endotracheal tube, tip positioned above the carina. Right neck vascular catheter remains in position. Unchanged mild, diffuse interstitial opacity and possible small layering pleural effusions. No new or focal airspace opacity. IMPRESSION: 1. Interval placement of esophagogastric tube, tip and side port below the diaphragm. 2. Interval advancement of endotracheal tube, tip positioned above the carina. Electronically Signed   By: Eddie Candle M.D.   On: 01/16/2020 15:54   DG Abd Portable 1V  Result Date: 01/16/2020 CLINICAL DATA:  Nasogastric tube placement EXAM: PORTABLE ABDOMEN - 1 VIEW COMPARISON:  CT abdomen and pelvis January 13, 2020 FINDINGS: Nasogastric tube tip and side port in stomach. Stomach appears to contain extensive food material. No bowel dilatation or air-fluid level to suggest bowel obstruction. No free air. Pancreatic stent again noted. Calcified granulomas in visualized portions of lungs. IMPRESSION: Nasogastric tube tip and side port in stomach. No bowel dilatation or free air evident. Extensive apparent food material in stomach. Pancreatic stent present. Electronically Signed   By: Lowella Grip III M.D.   On: 01/16/2020 11:55   IR EMBO ART  VEN HEMORR LYMPH EXTRAV  INC GUIDE ROADMAPPING  Result Date: 01/15/2020 CLINICAL DATA:  GI bleed. 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 EXAM: SELECTIVE VISCERAL ARTERIOGRAPHY; ADDITIONAL ARTERIOGRAPHY; IR ULTRASOUND GUIDANCE VASC ACCESS RIGHT; IR EMBO ART VEN HEMORR LYMPH EXTRAV INC GUIDE ROADMAPPING ANESTHESIA/SEDATION: Patient was intubated and receiving IV fentanyl MEDICATIONS: Lidocaine 1% subcutaneous CONTRAST:  Omnipaque 300 IA  PROCEDURE: The procedure was performed with implied emergent consent. Right femoral region prepped and draped in usual sterile fashion. Maximal barrier sterile technique was utilized including caps, mask, sterile gowns, sterile gloves, sterile drape, hand hygiene and skin antiseptic. The right common femoral artery was localized under ultrasound. Under real-time ultrasound guidance, the vessel was accessed with a 21-gauge micropuncture needle, exchanged over a 018 guidewire for a transitional dilator, through which a 035 guidewire was advanced. Over this, a 5 [place] vascular sheath was placed, through which a 5 [place] C2 catheter was advanced and used to selectively catheterize the celiac axis for selective arteriography. This was exchanged for a 5 [place] Sos catheter. Through this, a coaxial microcatheter with 016 guidewire were advanced across the lesion in the mid splenic artery. Coil embolization immediately distal to the lesion performed with 2, 3, and 4 mm interlock coils. Separately, 5 mm coils x2 placed in the pseudoaneurysm. Separately, 2, 3, and 4 mm interlock coils were used in the native splenic artery immediately proximal to the lesion. Hemostasis of flow was achieved. Follow-up arteriogram was performed. The catheter was removed. Because of coagulopathy, sheath was secured with 0 silk suture and placed to flush system. The patient tolerated the procedure well. 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 Technically successful coil embolization of the splenic artery immediately distal and proximal to the lesion. IMPRESSION: 1. 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 Electronically Signed   By: Lucrezia Europe M.D.   On: 01/15/2020 09:49    Labs:  CBC: Recent Labs    01/16/20 0554 01/16/20 1023 01/17/20 0439 01/17/20 1007  01/17/20 1350 01/17/20 2034  01/18/20 0334 01/18/20 0930 01/18/20 1735 01/19/20 0150  WBC 39.3*  --  29.8*  --  24.2*  --  16.5*  --   --   --   HGB 12.0   < > 10.5*   < > 11.0*   < > 11.2* 11.0* 11.4* 11.8*  HCT 33.9*   < > 31.1*   < > 32.4*   < > 33.3* 33.2* 34.7* 35.6*  PLT 57*  --  56*  --  55*  --  53*  --   --   --    < > = values in this interval not displayed.    COAGS: Recent Labs    01/14/20 1042 01/15/20 1013 01/17/20 0439  INR 3.5* 1.5* 1.5*    BMP: Recent Labs    01/16/20 1812 01/17/20 0439 01/18/20 0334 01/19/20 0700  NA 147* 148* 148* 151*  K 5.1 4.5 3.9 3.6  CL 107 109 111 115*  CO2 27 25 24 23   GLUCOSE 171* 88 104* 131*  BUN 53* 59* 78* 106*  CALCIUM 7.1* 7.2* 7.4* 8.2*  CREATININE 3.22* 3.43* 3.27* 3.24*  GFRNONAA 14* 13* 14* 14*  GFRAA 17* 15* 16* 16*    LIVER FUNCTION TESTS: Recent Labs    01/16/20 0504 01/17/20 0439 01/18/20 0334 01/19/20 0700  BILITOT 0.9 0.9 0.6 1.1  AST 1,473* 648* 302* 100*  ALT 658* 487* 333* 201*  ALKPHOS 45 64 61 55  PROT 4.0* 4.1* 4.2* 4.1*  ALBUMIN 1.9* 1.7* 1.7* 1.6*    Assessment and Plan: Pt with hx GI bleed; s/p embolization of mid splenic artery PSA  4/10; temp 99.1, hgb currently stable at 11.8(11.4), creat 3.24; groin access site ok; CT head ordered today; f/u at [facility] planned   Electronically Signed: [facility], PA-C 01/19/2020, 10:47 AM   I spent a total of 15 minutes at the the patient's bedside AND on the patient's hospital floor or unit, greater than 50% of which was counseling/coordinating care for visceral arteriogram with embolization    Patient ID: Natasha Jacobson, female   DOB: 20-Jun-1951, 69 y.o.   MRN: [id number]

## 2020-01-19 NOTE — Progress Notes (Signed)
eLink Physician-Brief Progress Note Patient Name: Natasha Jacobson DOB: 1951-06-25 MRN: [id number]   Date of Service  01/19/2020  HPI/Events of Note  Nursing has allowed bilateral soft wrist restraints to expire for the second day in a row.   eICU Interventions  Will order: 1. Bilateral soft wrist restraints X 5 hours.  Please be sure to have the day rounding team assess patient in AM and reorder restraints if appropriate.      Intervention Category Major Interventions: Delirium, psychosis, severe agitation - evaluation and management  Brianah Hopson Eugene 01/19/2020, 3:26 AM

## 2020-01-19 NOTE — Progress Notes (Signed)
NAME:  Natasha Jacobson, MRN:  [id number], DOB:  [birth year], LOS: 6 ADMISSION DATE:  01/13/2020, CONSULTATION DATE:  01/14/20 REFERRING MD:  Havery Moros, CHIEF COMPLAINT:  AMS   Brief History   69 year old woman with a hx of alcoholic pancreatitis s/p recent exchange of pancreaticogastrostomy stent at [facility] (4/8) who presented to the ED on 4/9 with hematemesis. On 4/10 she developed hematochezia, encephalopathy, and hypotension. She was transferred to the ICU and taken for emergent EGD that illustrated active upper GI hemorrhage with inadequate visualization for source control. 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 She was subsequently take for selective splenic arteriogram and coiled embolization of the pseudoaneurysm (4/10).   Past Medical History  Alcohol use disorder, chronic pancreatitis, COPD, CAD, HLD, HTN, MI, sick sinus syndrome  Significant Hospital Events   4/9 admitted; mass transfusion protocol 4/10 transfer to ICU; EGD, IR embolization 4/11 extubated 4/12 white count remains elevated at 39k, LFTs significantly elevated; continues to have dark red stools; hgb stable; minimal responsiveness; reintubated for increased work of breathing 4/13 LFTs improving. hgb stable. More responsive this morning. Minimal improvement in respiratory status. Increased BRBPR>>EGD 4/14 LFTs, renal function improving. More encephalopathic this morning. hgb stable. White count coming down 4/15 continues to be severely encephalopathic>head CT neg; progressive hypernatremia 148>151  Consults:  GI IR Procedures:  4/10 Intubation>>4/11 4/10 central line>>  4/12 ETT>>  Significant Diagnostic Tests:  CT A/P 01/13/20>>Mild peripancreatic haziness may represent acute on chronic pancreatitis. A pancreatic stent with pigtail ends in the second portion of the duodenum  and in the body of the stomach noted. No peripancreatic fluid collection or pseudocyst. Bilateral renal parenchyma wedge-shaped infarcts, new since the prior CT.   4/9 CTA a/p>>68mm pseudoaneurysm over a pancreatic branch of the celiac. Evidence of acute hemorrhage.  -Patient bilateral renal arteries with focal narrowing of the proximal 1cm of right renal artery. -interval distension of the stomach/duodenum likely 2/2 hemorrhage. Dilated proximal small bowel loops. Possible penumatosis involving descending colon  -worsening patchy bilateral low attenuation areas over the renal cortex which may be due to infarction -worsening free peritoneal fluid  4/13 CXR>>bibasilar atelectasis and small left effusion  4/13 EGD>>significant amount of clotted blood present in stomach. Superficial gastric ulcers. Diffuse moderate mucosal changes characterized by congestion, discoloration and decreased vascular pattern in the fundus and gastric body with questionable ischemia. No bleeding source apparent  4/15 CXR> possible small left sided effusion. Bibasilar atelectasis  Micro Data:  4/9 Urine culture>> ++ e.faecalis 4/9 COVID Neg  Antimicrobials:  4/9 Zosyn x 1 4/10 Ceftriaxone >> 4/12 Ampicillin 4/12>>  Interim history/subjective:  No overnight events  Objective   Blood pressure 98/65, pulse 64, temperature 99.7 F (37.6 C), resp. rate (!) 23, height 5\' 4"  (1.626 m), weight 50.7 kg, SpO2 100 %.    Vent Mode: PRVC FiO2 (%):  [30 %] 30 % Set Rate:  [20 bmp] 20 bmp Vt Set:  [440 mL] 440 mL PEEP:  [5 cmH20] 5 cmH20 Pressure Support:  [10 cmH20] 10 cmH20 Plateau Pressure:  [15 cmH20] 15 cmH20   Intake/Output Summary (Last 24 hours) at 01/19/2020 0615 Last data filed at 01/19/2020 0536 Gross per 24 hour  Intake 2362.54 ml  Output 1425 ml  Net 937.54 ml   Filed Weights   01/14/20 0108  Weight: 50.7 kg  Examination: General: critically ill appearing female HENT: ETT Pulm: mechanical  breath sounds. No wheezes appreciated on exam today CV: tachycardic rate. No peripheral edema Abdomen: abd distended and firm. Pain with palpation of all quadrants. Extremities: warm Skin: no rash Neuro: awake but not following commands. Severely aggitated and thrashing around in bed. Not responding to questions  Resolved Hospital Problem list   Hemorrhagic shock 2/2 bleeding 64mm pseudoaneurysm and recent instrumentation s/p EGD and coil embolization by IR 4/10  Assessment & Plan:   Shock A: continues to require pressor support. Infection vs sedation. Fever curve trending up. White count pending. Although unlikely, can not rule out cardiac etiology Liver shock improving P: will continue to work on weaning neo. Would consider obtaining repeat blood cultures with uptrending fever curve  GI bleed>splenic pseudoaneurysm>recent instrumentation for pancreatic stent  Acute blood loss anemia resolved A: hgb trending up now. 11.8 this morning. Continues to have maroon colored stools P: continue to monitor hemoglobin.   Thrombocytopenia.  A: remains severe but stable Leukocytosis A: white count continuing to trend down; 40k>30k>16.5. uptrending fever curve P: would consider obtaining blood cultures  Acute hypoxemic respiratory failure requiring mechanical ventilation reintubated 4/12 for increased work of breathing.  A: CXR this morning not significantly changed however did reveal a small left sided effusion. minimal vent settings this morning. Mental status barring extubation P: continue on ventilator until mental status improves. daily SBTs, VAP bundling  AKI 2/2 ATN A: no significant creatinine change from yesterday however BUN increased. UOP 1.4L yesterday.  P: continue close monitoring of her renal function. Could consider indication for HD in setting of uremic encephalopathy if other etiologies are ruled out.  Hypernatremia. Slight increase from 148>151. 1.6 free water deficit.  Consider increase free water boluses.  E. Faecalis urinary tract infection.  P: continue ampicillin day #4/5  Acute encephalopathy. Metabolic (uremia vs hepatic) vs sedation A: no significant changes. Head CT neg for acute findings. May be uremic P: continue precedex. wean sedation as able. Continue scheduled oxycodone for history of opioid use. Consider starting thiamine/folate.  High risk Malnutrition. Continue trickle feeds  Hypothyroidism. Continue synthroid.  Best practice:  Diet: NPO Pain/Anxiety/Delirium protocol (if indicated): precedex, prn fentanyl, scheduled oxy VAP protocol (if indicated): yes DVT prophylaxis: SCDs GI prophylaxis: PPI bid Glucose control: SSI Mobility: BR Code Status: Full Family Communication: daughter Disposition: ICU  Labs   CBC: Recent Labs  Lab 01/13/20 1705 01/13/20 1705 01/14/20 1042 01/14/20 1348 01/16/20 0554 01/16/20 1023 01/17/20 0439 01/17/20 1007 01/17/20 1350 01/17/20 1350 01/17/20 2034 01/18/20 0334 01/18/20 0930 01/18/20 1735 01/19/20 0150  WBC 17.9*   < > 38.0*  --  39.3*  --  29.8*  --  24.2*  --   --  16.5*  --   --   --   NEUTROABS 13.6*  --  29.4*  --   --   --   --   --   --   --   --  13.7*  --   --   --   HGB 8.4*   < > 3.9*   < > 12.0   < > 10.5*   < > 11.0*   < > 11.1* 11.2* 11.0* 11.4* 11.8*  HCT 26.3*   < > 13.6*   < > 33.9*   < > 31.1*   < > 32.4*   < > 33.6* 33.3* 33.2* 34.7* 35.6*  MCV 94.3   < > 111.5*  --  86.7  --  90.1  --  91.0  --   --  91.7  --   --   --   PLT 293   < > 189  --  57*  --  56*  --  55*  --   --  53*  --   --   --    < > = values in this interval not displayed.    Basic Metabolic Panel: Recent Labs  Lab 01/14/20 2140 01/15/20 0833 01/15/20 0837 01/15/20 0837 01/16/20 0504 01/16/20 1030 01/16/20 1812 01/17/20 0439 01/18/20 0334  NA  --    < > 148*   < > 147* 144 147* 148* 148*  K  --    < > 2.9*   < > 5.1 5.2* 5.1 4.5 3.9  CL  --   --  102  --  107  --  107 109 111  CO2   --   --  30  --  27  --  27 25 24   GLUCOSE  --   --  134*  --  159*  --  171* 88 104*  BUN  --   --  39*  --  48*  --  53* 59* 78*  CREATININE  --   --  2.66*  --  3.04*  --  3.22* 3.43* 3.27*  CALCIUM  --   --  7.5*  --  7.5*  --  7.1* 7.2* 7.4*  MG 1.7  --   --   --   --   --   --  2.0  --   PHOS  --   --   --   --   --   --   --  6.4*  --    < > = values in this interval not displayed.   GFR: Estimated Creatinine Clearance: 13.5 mL/min (A) (by C-G formula based on SCr of 3.27 mg/dL (H)). Recent Labs  Lab 01/16/20 0554 01/17/20 0439 01/17/20 1350 01/18/20 0334  WBC 39.3* 29.8* 24.2* 16.5*    Liver Function Tests: Recent Labs  Lab 01/13/20 1705 01/14/20 1042 01/16/20 0504 01/17/20 0439 01/18/20 0334  AST 21 214* 1,473* 648* 302*  ALT 10 124* 658* 487* 333*  ALKPHOS 39 50 45 64 61  BILITOT 0.4 0.3 0.9 0.9 0.6  PROT 5.4* 3.1* 4.0* 4.1* 4.2*  ALBUMIN 2.8* 1.6* 1.9* 1.7* 1.7*   Recent Labs  Lab 01/13/20 1705  LIPASE 256*   Recent Labs  Lab 01/16/20 1023  AMMONIA 35    ABG    Component Value Date/Time   PHART 7.506 (H) 01/16/2020 1030   PCO2ART 35.3 01/16/2020 1030   PO2ART 48.0 (L) 01/16/2020 1030   HCO3 28.2 (H) 01/16/2020 1030   TCO2 29 01/16/2020 1030   ACIDBASEDEF 7.0 (H) 01/14/2020 1944   O2SAT 89.0 01/16/2020 1030     Coagulation Profile: Recent Labs  Lab 01/14/20 1042 01/15/20 1013 01/17/20 0439  INR 3.5* 1.5* 1.5*    CBG: Recent Labs  Lab 01/18/20 1115 01/18/20 1516 01/18/20 1918 01/18/20 2318 01/19/20 0325  GLUCAP 98 126* 112* 102* 99    Eulia Hatcher, MD 01/19/20 6:15 AM

## 2020-01-19 NOTE — Progress Notes (Signed)
PHARMACY NOTE:  ANTIMICROBIAL RENAL DOSAGE ADJUSTMENT  Current antimicrobial regimen includes a mismatch between antimicrobial dosage and estimated renal function.  As per policy approved by the Pharmacy & Therapeutics and Medical Executive Committees, the antimicrobial dosage will be adjusted accordingly.  Current antimicrobial dosage:  Ampicillin 1g IV Q12h   Indication: Enterococcal UTI  Renal Function:  Estimated Creatinine Clearance: 13.5 mL/min (A) (by C-G formula based on SCr of 3.27 mg/dL (H)). []      On intermittent HD, scheduled: []      On CRRT    Antimicrobial dosage has been changed to:  Ampicillin 2g IV Q12h  Additional comments: Pt's temperature increased since yesterday so will increase to higher end of dose range. Stop date in place for 4 days of therapy 4/12>4/17   Thank you for allowing pharmacy to be a part of this patient's care.  , PharmD PGY1 Ambulatory Care Pharmacy Resident 01/19/2020 9:28 AM

## 2020-01-19 NOTE — Progress Notes (Signed)
Nutrition Follow-up  RD working remotely.  DOCUMENTATION CODES:   Not applicable  INTERVENTION:  - continue Vital AF 1.2 @ 20 ml/hr with 30 ml prostat TID. - goal rate for TF: Vital AF 1.2 @ 50 ml/hr to provide 1440 kcal (103% body weight), 90 grams protein, and 973 ml free water.  - could consider post-pyloric Cortrak (service available tomorrow, 4/16) if ongoing concern about increasing TF d/t blood clots in the stomach.    NUTRITION DIAGNOSIS:   Inadequate oral intake related to inability to eat as evidenced by NPO status. -ongoing  GOAL:   Patient will meet greater than or equal to 90% of their needs -unmet with current TF rate  MONITOR:   Vent status, TF tolerance, Labs, Weight trends  ASSESSMENT:   69 y.o. female with history of chronic alcoholic pancreatitis s/p exchange of pancreatico-gastrostomy stent at [facility] on 01/12/20. She started developing abdominal pain later in the evening which continued to worsen and 4/9 she began having hematemesis. She also had dark stools following which patient decided to come to the ED. Pain has been continuous epigastric in nature radiating to the back. CT abdomen/pelvis in the ED showed concern for acute on chronic pancreatitis and UA concerning for UTI. No bed available at [facility] to transfer patient so ED MD talked with GI and patient was admitted at [facility].  She has not been weighed since 4/10. Patient remains intubated with OGT in place and is receiving Vital AF 1.2 @ 20 ml/hr with 30 ml prostat TID. This regimen is providing 876 kcal (62% estimated kcal need), 81 grams protein, and 389 ml free water.   Per notes: - LFT abnormalities improving, thought to be 2/2 ischemic hepatitis - Hgb stable - s/p EGD 4/13 which showed non-bleeding, mild esophagitis, small hiatal hernia, possible evolving ischemia to gastric mucosa, non-bleeding erosive gastropathy - blood loss anemia s/p units of FFP and PRBCs - encepalopathy - chronic  pain syndrome  Patient is currently intubated on ventilator support MV: 9.3 L/min Temp (24hrs), Avg:99.4 F (37.4 C), Min:98.8 F (37.1 C), Max:100.2 F (37.9 C) Propofol: none  Labs reviewed; CBGs: 99 and 127 mg/dl. Medications reviewed; sliding scale novolog, 50 mcg synthroid per OGT/day, 40 mg IV protonix BID. IVF; D10 @ 30 ml/hr (245 kcal). Drips; precedex @ 1.2 mcg/kg/hr, neo @ 24 mcg/min.     NUTRITION - FOCUSED PHYSICAL EXAM:  unable to complete at this time.   Diet Order:   Diet Order            Diet NPO time specified  Diet effective now              EDUCATION NEEDS:   Not appropriate for education at this time  Skin:  Skin Assessment: Reviewed RN Assessment  Last BM:  4/25 (type 7)  Height:   Ht Readings from Last 1 Encounters:  01/14/20 5\' 4"  (1.626 m)    Weight:   Wt Readings from Last 1 Encounters:  01/14/20 50.7 kg    Ideal Body Weight:  54.5 kg  BMI:  Body mass index is 19.19 kg/m.  Estimated Nutritional Needs:   Kcal:  1401 kcal  Protein:  76-101 grams (1.5-2 grams/kg)  Fluid:  >/= 1.8 L/day     03/15/20, MS, RD, LDN, CNSC Inpatient Clinical Dietitian RD pager # available in AMION  After hours/weekend pager # available in [facility]

## 2020-01-20 DIAGNOSIS — N179 Acute kidney failure, unspecified: Secondary | ICD-10-CM | POA: Diagnosis not present

## 2020-01-20 DIAGNOSIS — J9601 Acute respiratory failure with hypoxia: Secondary | ICD-10-CM | POA: Diagnosis not present

## 2020-01-20 DIAGNOSIS — G9340 Encephalopathy, unspecified: Secondary | ICD-10-CM | POA: Diagnosis not present

## 2020-01-20 LAB — COMPREHENSIVE METABOLIC PANEL
ALT: 139 U/L — ABNORMAL HIGH (ref 0–44)
AST: 55 U/L — ABNORMAL HIGH (ref 15–41)
Albumin: 1.5 g/dL — ABNORMAL LOW (ref 3.5–5.0)
Alkaline Phosphatase: 53 U/L (ref 38–126)
Anion gap: 12 (ref 5–15)
BUN: 126 mg/dL — ABNORMAL HIGH (ref 8–23)
CO2: 22 mmol/L (ref 22–32)
Calcium: 8 mg/dL — ABNORMAL LOW (ref 8.9–10.3)
Chloride: 118 mmol/L — ABNORMAL HIGH (ref 98–111)
Creatinine, Ser: 3.03 mg/dL — ABNORMAL HIGH (ref 0.44–1.00)
GFR calc Af Amer: 18 mL/min — ABNORMAL LOW (ref >60)
GFR calc non Af Amer: 15 mL/min — ABNORMAL LOW (ref >60)
Glucose, Bld: 134 mg/dL — ABNORMAL HIGH (ref 70–99)
Potassium: 3.1 mmol/L — ABNORMAL LOW (ref 3.5–5.1)
Sodium: 152 mmol/L — ABNORMAL HIGH (ref 135–145)
Total Bilirubin: 0.7 mg/dL (ref 0.3–1.2)
Total Protein: 3.9 g/dL — ABNORMAL LOW (ref 6.5–8.1)

## 2020-01-20 LAB — URINALYSIS, ROUTINE W REFLEX MICROSCOPIC
Bilirubin Urine: NEGATIVE
Glucose, UA: NEGATIVE mg/dL
Ketones, ur: NEGATIVE mg/dL
Nitrite: NEGATIVE
Protein, ur: 30 mg/dL — AB
Specific Gravity, Urine: 1.015 (ref 1.005–1.030)
pH: 5 (ref 5.0–8.0)

## 2020-01-20 LAB — CBC
HCT: 35.1 % — ABNORMAL LOW (ref 36.0–46.0)
Hemoglobin: 11.3 g/dL — ABNORMAL LOW (ref 12.0–15.0)
MCH: 30.7 pg (ref 26.0–34.0)
MCHC: 32.2 g/dL (ref 30.0–36.0)
MCV: 95.4 fL (ref 80.0–100.0)
Platelets: 111 10*3/uL — ABNORMAL LOW (ref 150–400)
RBC: 3.68 MIL/uL — ABNORMAL LOW (ref 3.87–5.11)
RDW: 16.3 % — ABNORMAL HIGH (ref 11.5–15.5)
WBC: 21.7 10*3/uL — ABNORMAL HIGH (ref 4.0–10.5)
nRBC: 2.5 % — ABNORMAL HIGH (ref 0.0–0.2)

## 2020-01-20 LAB — HEMOGLOBIN AND HEMATOCRIT, BLOOD
HCT: 35 % — ABNORMAL LOW (ref 36.0–46.0)
Hemoglobin: 11.3 g/dL — ABNORMAL LOW (ref 12.0–15.0)

## 2020-01-20 LAB — GLUCOSE, CAPILLARY
Glucose-Capillary: 123 mg/dL — ABNORMAL HIGH (ref 70–99)
Glucose-Capillary: 115 mg/dL — ABNORMAL HIGH (ref 70–99)
Glucose-Capillary: 132 mg/dL — ABNORMAL HIGH (ref 70–99)
Glucose-Capillary: 111 mg/dL — ABNORMAL HIGH (ref 70–99)
Glucose-Capillary: 85 mg/dL (ref 70–99)

## 2020-01-20 LAB — SODIUM, URINE, RANDOM: Sodium, Ur: 43 mmol/L

## 2020-01-20 LAB — CREATININE, URINE, RANDOM: Creatinine, Urine: 30.03 mg/dL

## 2020-01-20 LAB — PROCALCITONIN: Procalcitonin: 1.41 ng/mL

## 2020-01-20 MED ORDER — FENTANYL CITRATE (PF) 100 MCG/2ML IJ SOLN: 25.0000 ug | INTRAMUSCULAR | Status: DC | PRN

## 2020-01-20 MED ORDER — OXYCODONE HCL 5 MG/5ML PO SOLN
5.0000 mg | Freq: Four times a day (QID) | ORAL | Status: DC | PRN
  Administered 2020-01-20 – 2020-01-21 (×2): 5 mg
  Filled 2020-01-20 (×2): qty 5

## 2020-01-20 MED ORDER — OXYCODONE HCL 5 MG/5ML PO SOLN
5.0000 mg | Freq: Two times a day (BID) | ORAL | Status: DC
  Administered 2020-01-20 – 2020-01-25 (×8): 5 mg
  Filled 2020-01-20 (×8): qty 5

## 2020-01-20 MED ORDER — DEXTROSE 5 % IV SOLN: INTRAVENOUS | Status: DC

## 2020-01-20 MED ORDER — FENTANYL CITRATE (PF) 100 MCG/2ML IJ SOLN: 25.0000 ug | INTRAMUSCULAR | Status: DC

## 2020-01-20 MED ORDER — POTASSIUM CHLORIDE 20 MEQ/15ML (10%) PO SOLN
40.0000 meq | Freq: Once | ORAL | Status: AC
  Administered 2020-01-20: 40 meq
  Filled 2020-01-20: qty 30

## 2020-01-20 NOTE — Procedures (Signed)
Cortrak  Person Inserting Tube:  Natasha Jacobson, RD Tube Type:  Cortrak - 43 inches Tube Location:  Right nare Initial Placement:  Postpyloric Secured by: Bridle Technique Used to Measure Tube Placement:  Documented cm marking at nare/ corner of mouth Cortrak Secured At:  93 cm   No x-ray is required. RN may begin using tube.   If the tube becomes dislodged please keep the tube and contact the Cortrak team at [email or web address] (password [email or web address]) for replacement.  If after hours and replacement cannot be delayed, place a NG tube and confirm placement with an abdominal x-ray.    Vanessa Kick RD, LDN Clinical Nutrition Pager listed in [facility]ION

## 2020-01-20 NOTE — Progress Notes (Signed)
PULMONARY / CRITICAL CARE MEDICINE   NAME:  Natasha Jacobson, MRN:  [id number], DOB:  1951-02-15, LOS: 7 ADMISSION DATE:  01/13/2020, CONSULTATION DATE:  01/14/20 REFERRING MD:  Adela Lank, CHIEF COMPLAINT:  AMS  BRIEF HISTORY:    69 year old woman with a hx of alcoholic pancreatitis s/p recent exchange of pancreaticogastrostomy stent at [facility] (4/8) who presented to the ED on 4/9 with hematemesis. On 4/10 she developed hematochezia, encephalopathy, and hypotension. She was transferred to the ICU and taken for emergent EGD that illustrated active upper GI hemorrhage with inadequate visualization for source control. 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 She was subsequently take for selective splenic arteriogram and coiled embolization of the pseudoaneurysm (4/10).   SIGNIFICANT PAST MEDICAL HISTORY    Past Medical History:  Diagnosis Date  . 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 Alcohol-induced chronic pancreatitis (HCC)   . Brittle bone disease   . Chronic pain syndrome   . COPD (chronic obstructive pulmonary disease) (HCC)   . Hearing loss, right 07/08/2016  . Hepatitis C    Eradicated with antiviral treatment per patient  . High cholesterol   . Hypertension   . Hypopotassemia   . Hypothyroidism 07/08/2016  . Prediabetes   . SSS (sick sinus syndrome) (HCC) 09/06/2015  . Thyroid disease   . Vitamin D deficiency    SIGNIFICANT EVENTS:  4/9 admitted; mass transfusion protocol 4/10 transfer to ICU; EGD, IR embolization 4/11 extubated 4/12 white count remains elevated at 39k, LFTs significantly elevated; continues to have dark red stools; hgb stable; minimal responsiveness; reintubated for increased work of breathing 4/13 LFTs improving. hgb stable. More responsive this morning. Minimal improvement in  respiratory status. Increased BRBPR>>EGD 4/14 LFTs, renal function improving. More encephalopathic this morning. hgb stable. White count coming down 4/15 continues to be severely encephalopathic>head CT neg; progressive hypernatremia 148>151 STUDIES:   CT A/P 01/13/20>>Mild peripancreatic haziness may represent acute on chronic pancreatitis. A pancreatic stent with pigtail ends in the second portion of the duodenum and in the body of the stomach noted. No peripancreatic fluid collection or pseudocyst. Bilateral renal parenchyma wedge-shaped infarcts, new since the prior CT.   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 Evidence of acute hemorrhage.  -Patient bilateral renal arteries with focal narrowing of the proximal 1cm of right renal artery. -interval distension of the stomach/duodenum likely 2/2 hemorrhage. Dilated proximal small bowel loops. Possible penumatosis involving descending colon  -worsening patchy bilateral low attenuation areas over the renal cortex which may be due to infarction -worsening free peritoneal fluid  4/13 CXR>>bibasilar atelectasis and small left effusion  4/13 EGD>>significant amount of clotted blood present in stomach. Superficial gastric ulcers. Diffuse moderate mucosal changes characterized by congestion, discoloration and decreased vascular pattern in the fundus and gastric body with questionable ischemia. No bleeding source apparent  4/15 CXR> possible small left sided effusion. Bibasilar atelectasis  CULTURES:  4/9 Urine culture>> ++ e.faecalis 4/9 COVID Neg  ANTIBIOTICS:  4/9 Zosyn x 1 4/10 Ceftriaxone >> 4/12 Ampicillin 4/12>>  LINES/TUBES:  4/10 Intubation>>4/11 4/10 central line>>  4/12 ETT>>  CONSULTANTS:  GI IR  SUBJECTIVE:  Patient more alert and oriented today.  She is resting comfortably in bed with mechanical ventilation.  CONSTITUTIONAL: BP 110/62   Pulse 87  Temp 97.9 F (36.6 C)   Resp (!) 31    Ht 5\' 4"  (1.626 m)   Wt 58 kg   SpO2 99%   BMI 21.95 kg/m   I/O last 3 completed shifts: In: 3151 [I.V.:2027.5; NG/GT:812.9; IV Piggyback:310.5] Out: 2275 [Urine:2275]     Vent Mode: CPAP;PSV FiO2 (%):  [30 %] 30 % Set Rate:  [20 bmp] 20 bmp Vt Set:  [440 mL] 440 mL PEEP:  [5 cmH20] 5 cmH20 Pressure Support:  [10 cmH20] 10 cmH20 Plateau Pressure:  [11 cmH20-12 cmH20] 11 cmH20  PHYSICAL EXAM: Physical Exam  Constitutional: No distress.  HENT:  Head: Normocephalic.  Eyes: EOM are normal.  Cardiovascular: Intact distal pulses.  Pulmonary/Chest: No respiratory distress. She has no wheezes.  Abdominal: Soft. She exhibits no distension.  Musculoskeletal:        General: No edema. Normal range of motion.  Neurological: She is alert.  Skin: Skin is warm and dry. She is not diaphoretic.    RESOLVED PROBLEM LIST  Hemorrhagic shock 2/2 bleeding 90mm pseudoaneurysm and recent instrumentation s/p EGD and coil embolization by IR 4/10  ASSESSMENT AND PLAN   Shock Leukocytosis Continues to require phenylephrine at 10 mcg/min.  Afebrile overnight but white count trending up from 16> 18> 21.7 today. She is finishing her 5th day of ampicillin for E faecalis.  Clinically looks like she is improved, but white count has trended back upward.  Transaminase enzymes continue to trend downward. -Order procalcitonin today -Repeat blood cultures, repeat sputum cultures  GI bleed>splenic pseudoaneurysm>recent instrumentation for pancreatic stent  Acute blood loss anemia resolved Patient's hemoglobin stable at 11.3. -Continue to monitor  Thrombocytopenia.  Patient's thrombocytopenia is improving from 80 to 111 - Continue to monitor.   Acute hypoxemic respiratory failure requiring mechanical ventilation Patient appears to be clinically improving.  We will wean sedation today and initiate spontaneous breathing trials. -Wean Precedex -Initiate spontaneous breathing trials with pressure  support 10/5  AKI 2/2 ATN Improving minimally from 3.24 yesterday to 3.04 today. And GFR at 18.  BUN continues to be elevated at 126 likely secondary to resolved upper GI bleed.  Patient does have a potassium of 3.1 and a sodium of 152 but are otherwise difficult to assess overt signs of uremia.  Patient appears euvolemic with good urine output. -No emergent need for CRRT or HD -We will get fractional excretion of sodium today to determine prerenal versus intrinsic renal azotemia. -We will continue free water with D5W at 75 cc/h for hypernatremia   E. Faecalis urinary tract infection.  P: continue ampicillin day #5/5  Acute encephalopathy. Patient is more alert and orient today.  Sedation and try to extubate.  High risk Malnutrition.  -Core track today with tube feeds.  Hypothyroidism. Continue synthroid.  SUMMARY OF TODAY'S PLAN:  Patient is more alert and oriented today.  We will try to wean sedation and extubate her when possible.  Best Practice / Goals of Care / Disposition.   Diet: cortrack, tube feeds Pain/Anxiety/Delirium protocol (if indicated): precedex, prn fentanyl, scheduled oxy VAP protocol (if indicated): yes DVT prophylaxis: SCDs GI prophylaxis: PPI bid Glucose control: SSI Mobility: BR Code Status: Full Family Communication: daughter Disposition: ICU  LABS  Glucose Recent Labs  Lab 01/19/20 1205 01/19/20 1544 01/19/20 1952 01/19/20 2338 01/20/20 0405 01/20/20 0732  GLUCAP 132* 107* 108* 127* 123* 115*    BMET Recent Labs  Lab 01/18/20 0334 01/19/20 0700 01/20/20 0600  NA 148* 151* 152*  K 3.9  3.6 3.1*  CL 111 115* 118*  CO2 24 23 22   BUN 78* 106* 126*  CREATININE 3.27* 3.24* 3.03*  GLUCOSE 104* 131* 134*    Liver Enzymes Recent Labs  Lab 01/18/20 0334 01/19/20 0700 01/20/20 0600  AST 302* 100* 55*  ALT 333* 201* 139*  ALKPHOS 61 55 53  BILITOT 0.6 1.1 0.7  ALBUMIN 1.7* 1.6* 1.5*    Electrolytes Recent Labs  Lab  01/14/20 2140 01/15/20 0837 01/17/20 0439 01/17/20 0439 01/18/20 0334 01/19/20 0700 01/20/20 0600  CALCIUM  --    < > 7.2*   < > 7.4* 8.2* 8.0*  MG 1.7  --  2.0  --   --   --   --   PHOS  --   --  6.4*  --   --   --   --    < > = values in this interval not displayed.    CBC Recent Labs  Lab 01/18/20 0334 01/18/20 0930 01/19/20 1336 01/19/20 2045 01/20/20 0600  WBC 16.5*  --  18.6*  --  21.7*  HGB 11.2*   < > 11.7* 11.6* 11.3*  HCT 33.3*   < > 36.0 35.5* 35.1*  PLT 53*  --  80*  --  111*   < > = values in this interval not displayed.    ABG Recent Labs  Lab 01/14/20 1944 01/15/20 0833 01/16/20 1030  PHART 7.433 7.598* 7.506*  PCO2ART 24.0* 34.9 35.3  PO2ART 54.0* 63.0* 48.0*    Coag's Recent Labs  Lab 01/14/20 1042 01/15/20 1013 01/17/20 0439  INR 3.5* 1.5* 1.5*    Sepsis Markers No results for input(s): LATICACIDVEN, PROCALCITON, O2SATVEN in the last 168 hours.  Cardiac Enzymes No results for input(s): TROPONINI, PROBNP in the last 168 hours.  PAST MEDICAL HISTORY :   She  has a past medical history of Adhesive capsulitis of left shoulder (06/06/2016), Alcohol-induced chronic pancreatitis (HCC), Brittle bone disease, Chronic pain syndrome, COPD (chronic obstructive pulmonary disease) (HCC), Hearing loss, right (07/08/2016), Hepatitis C, High cholesterol, Hypertension, Hypopotassemia, Hypothyroidism (07/08/2016), Prediabetes, SSS (sick sinus syndrome) (HCC) (09/06/2015), Thyroid disease, and Vitamin D deficiency.  PAST SURGICAL HISTORY:  She  has a past surgical history that includes PACEMAKER IMPLANT; Tonsillectomy; Abdominal hysterectomy; Appendectomy; ERCP; IR Angiogram Visceral Selective (01/15/2020); IR EMBO ART  VEN HEMORR LYMPH EXTRAV  INC GUIDE ROADMAPPING (01/15/2020); IR Angiogram Selective Each Additional Vessel (01/15/2020); IR 03/16/2020 Guide Vasc Access Right (01/15/2020); Esophagogastroduodenoscopy (egd) with propofol (N/A, 01/14/2020);  Esophagogastroduodenoscopy (N/A, 01/17/2020); and Foreign Body Removal (01/17/2020).  Allergies  Allergen Reactions  . Buprenorphine Nausea Only  . Aspirin Nausea Only    GI upset GI upset     No current facility-administered medications on file prior to encounter.   Current Outpatient Medications on File Prior to Encounter  Medication Sig  . acetaminophen (TYLENOL) 500 MG tablet Take 2 tablets by mouth every 8 (eight) hours as needed.  01/19/2020 albuterol (VENTOLIN HFA) 108 (90 Base) MCG/ACT inhaler Inhale 2 puffs into the lungs every 4 (four) hours as needed.  Marland Kitchen amLODipine (NORVASC) 5 MG tablet Take 1 tablet by mouth daily.  . baclofen (LIORESAL) 10 MG tablet Take 1 tablet by mouth daily as needed.  . ergocalciferol (VITAMIN D2) 1.25 MG (50000 UT) capsule Take 1 capsule by mouth once a week.  . fenofibrate (TRICOR) 48 MG tablet Take 1 tablet by mouth daily.  Marland Kitchen gabapentin (NEURONTIN) 300 MG capsule Take 1 capsule by mouth 3 (three) times  daily.  . levothyroxine (SYNTHROID) 50 MCG tablet Take 50 mcg by mouth daily before breakfast.  . Melatonin 5 MG CAPS Take 2 capsules by mouth at bedtime.  . Oxycodone HCl 10 MG TABS Take 10 mg by mouth every 6 (six) hours as needed for pain.  . Pancrelipase, Lip-Prot-Amyl, (CREON) 24000-76000 units CPEP Take 1 capsule by mouth 3 (three) times daily.  . pantoprazole (PROTONIX) 40 MG tablet Take 40 mg by mouth daily.  . polyethylene glycol powder (GLYCOLAX/MIRALAX) 17 GM/SCOOP powder Take 17 g by mouth as needed.  . senna (SENOKOT) 8.6 MG TABS tablet Take 1 tablet by mouth as needed for mild constipation.  . Tiotropium Bromide Monohydrate (SPIRIVA RESPIMAT) 1.25 MCG/ACT AERS Inhale 2 puffs into the lungs daily.    FAMILY HISTORY:   Her family history includes Emphysema in her mother; Heart attack in her brother; Heart block in her father; Lung cancer in her mother; Thyroid disease in her daughter, granddaughter, and mother.  SOCIAL HISTORY:  She  reports  that she has been smoking cigarettes. She has never used smokeless tobacco. She reports previous alcohol use. She reports previous drug use.  REVIEW OF SYSTEMS:    Negative except for as noted in subjective.

## 2020-01-20 NOTE — Procedures (Signed)
Extubation Procedure Note  Patient Details:   Name: Natasha Jacobson DOB: 1951/08/05 MRN: [id number]   Airway Documentation:    Vent end date: 01/20/20 Vent end time: 1518   Evaluation  O2 sats: stable throughout Complications: No apparent complications Patient did tolerate procedure well. Bilateral Breath Sounds: Rhonchi, Diminished   Yes   RT extubated patient to 4L [place] per MD order with RN at bedside. Positive cuff leak noted. Patient currently sating 99%. No stridor noted. RT will continue to monitor as needed.   Lura Em 01/20/2020, 3:18 PM

## 2020-01-20 NOTE — Progress Notes (Signed)
eLink Physician-Brief Progress Note Patient Name: Natasha Jacobson DOB: [birth year] MRN: [id number]   Date of Service  01/20/2020  HPI/Events of Note  Nursing request to change Fentanyl IV PRN to Oxycodone per tube PRN.   eICU Interventions  Will order: 1. D/C Fentanyl IV PRN. 2. Oxycodone liquid 5 mg per tube Q 6 hours PRN pain.     Intervention Category Major Interventions: Other:  Lenell Antu 01/20/2020, 9:38 PM

## 2020-01-21 ENCOUNTER — Inpatient Hospital Stay (HOSPITAL_COMMUNITY): Payer: Medicare Other

## 2020-01-21 DIAGNOSIS — N179 Acute kidney failure, unspecified: Secondary | ICD-10-CM | POA: Diagnosis not present

## 2020-01-21 DIAGNOSIS — G9340 Encephalopathy, unspecified: Secondary | ICD-10-CM | POA: Diagnosis not present

## 2020-01-21 DIAGNOSIS — K859 Acute pancreatitis without necrosis or infection, unspecified: Secondary | ICD-10-CM | POA: Diagnosis not present

## 2020-01-21 LAB — HEMOGLOBIN AND HEMATOCRIT, BLOOD
HCT: 36.3 % (ref 36.0–46.0)
Hemoglobin: 11.6 g/dL — ABNORMAL LOW (ref 12.0–15.0)

## 2020-01-21 LAB — BASIC METABOLIC PANEL
Anion gap: 12 (ref 5–15)
BUN: 114 mg/dL — ABNORMAL HIGH (ref 8–23)
CO2: 20 mmol/L — ABNORMAL LOW (ref 22–32)
Calcium: 7.7 mg/dL — ABNORMAL LOW (ref 8.9–10.3)
Chloride: 120 mmol/L — ABNORMAL HIGH (ref 98–111)
Creatinine, Ser: 2.46 mg/dL — ABNORMAL HIGH (ref 0.44–1.00)
GFR calc Af Amer: 23 mL/min — ABNORMAL LOW (ref >60)
GFR calc non Af Amer: 20 mL/min — ABNORMAL LOW (ref >60)
Glucose, Bld: 143 mg/dL — ABNORMAL HIGH (ref 70–99)
Potassium: 3.4 mmol/L — ABNORMAL LOW (ref 3.5–5.1)
Sodium: 152 mmol/L — ABNORMAL HIGH (ref 135–145)

## 2020-01-21 LAB — COMPREHENSIVE METABOLIC PANEL
ALT: 107 U/L — ABNORMAL HIGH (ref 0–44)
AST: 61 U/L — ABNORMAL HIGH (ref 15–41)
Albumin: 1.6 g/dL — ABNORMAL LOW (ref 3.5–5.0)
Alkaline Phosphatase: 72 U/L (ref 38–126)
Anion gap: 10 (ref 5–15)
BUN: 116 mg/dL — ABNORMAL HIGH (ref 8–23)
CO2: 21 mmol/L — ABNORMAL LOW (ref 22–32)
Calcium: 7.8 mg/dL — ABNORMAL LOW (ref 8.9–10.3)
Chloride: 118 mmol/L — ABNORMAL HIGH (ref 98–111)
Creatinine, Ser: 2.67 mg/dL — ABNORMAL HIGH (ref 0.44–1.00)
GFR calc Af Amer: 21 mL/min — ABNORMAL LOW (ref >60)
GFR calc non Af Amer: 18 mL/min — ABNORMAL LOW (ref >60)
Glucose, Bld: 134 mg/dL — ABNORMAL HIGH (ref 70–99)
Potassium: 2.6 mmol/L — CL (ref 3.5–5.1)
Sodium: 149 mmol/L — ABNORMAL HIGH (ref 135–145)
Total Bilirubin: 0.7 mg/dL (ref 0.3–1.2)
Total Protein: 4.1 g/dL — ABNORMAL LOW (ref 6.5–8.1)

## 2020-01-21 LAB — GLUCOSE, CAPILLARY
Glucose-Capillary: 113 mg/dL — ABNORMAL HIGH (ref 70–99)
Glucose-Capillary: 120 mg/dL — ABNORMAL HIGH (ref 70–99)
Glucose-Capillary: 124 mg/dL — ABNORMAL HIGH (ref 70–99)
Glucose-Capillary: 126 mg/dL — ABNORMAL HIGH (ref 70–99)
Glucose-Capillary: 127 mg/dL — ABNORMAL HIGH (ref 70–99)
Glucose-Capillary: 128 mg/dL — ABNORMAL HIGH (ref 70–99)
Glucose-Capillary: 60 mg/dL — ABNORMAL LOW (ref 70–99)

## 2020-01-21 LAB — CBC
HCT: 35 % — ABNORMAL LOW (ref 36.0–46.0)
Hemoglobin: 11.4 g/dL — ABNORMAL LOW (ref 12.0–15.0)
MCH: 30 pg (ref 26.0–34.0)
MCHC: 32.6 g/dL (ref 30.0–36.0)
MCV: 92.1 fL (ref 80.0–100.0)
Platelets: 176 10*3/uL (ref 150–400)
RBC: 3.8 MIL/uL — ABNORMAL LOW (ref 3.87–5.11)
RDW: 15.9 % — ABNORMAL HIGH (ref 11.5–15.5)
WBC: 27 10*3/uL — ABNORMAL HIGH (ref 4.0–10.5)
nRBC: 1.3 % — ABNORMAL HIGH (ref 0.0–0.2)

## 2020-01-21 LAB — PROCALCITONIN: Procalcitonin: 0.97 ng/mL

## 2020-01-21 MED ORDER — POTASSIUM CHLORIDE 10 MEQ/50ML IV SOLN
10.0000 meq | INTRAVENOUS | Status: DC
  Administered 2020-01-21: 10 meq via INTRAVENOUS
  Filled 2020-01-21 (×2): qty 50

## 2020-01-21 MED ORDER — ORAL CARE MOUTH RINSE
15.0000 mL | Freq: Two times a day (BID) | OROMUCOSAL | Status: DC
  Administered 2020-01-22 – 2020-01-23 (×4): 15 mL via OROMUCOSAL

## 2020-01-21 MED ORDER — CHLORHEXIDINE GLUCONATE 0.12 % MT SOLN
15.0000 mL | Freq: Two times a day (BID) | OROMUCOSAL | Status: DC
  Administered 2020-01-21 – 2020-01-23 (×5): 15 mL via OROMUCOSAL
  Filled 2020-01-21 (×5): qty 15

## 2020-01-21 MED ORDER — POTASSIUM CHLORIDE 20 MEQ PO PACK
40.0000 meq | PACK | ORAL | Status: AC
  Administered 2020-01-21 (×2): 40 meq
  Filled 2020-01-21 (×2): qty 2

## 2020-01-21 NOTE — Progress Notes (Addendum)
NAME:  Natasha Jacobson, MRN:  [id number], DOB:  [birth year], LOS: 8 ADMISSION DATE:  01/13/2020, CONSULTATION DATE:  01/14/20 REFERRING MD:  Havery Moros, CHIEF COMPLAINT:  AMS   Brief History   69 year old woman with a hx of alcoholic pancreatitis s/p recent exchange of pancreaticogastrostomy stent at [facility] (4/8) who presented to the ED on 4/9 with hematemesis. On 4/10 she developed hematochezia, encephalopathy, and hypotension. She was transferred to the ICU and taken for emergent EGD that illustrated active upper GI hemorrhage with inadequate visualization for source control. 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 She was subsequently take for selective splenic arteriogram and coiled embolization of the pseudoaneurysm (4/10).   Past Medical History  Alcohol use disorder, chronic pancreatitis, COPD, CAD, HLD, HTN, MI, sick sinus syndrome  Significant Hospital Events   4/9 admitted; mass transfusion protocol 4/10 transfer to ICU; EGD, IR embolization 4/11 extubated 4/12 white count remains elevated at 39k, LFTs significantly elevated; continues to have dark red stools; hgb stable; minimal responsiveness; reintubated for increased work of breathing 4/13 LFTs improving. hgb stable. More responsive this morning. Minimal improvement in respiratory status. Increased BRBPR>>EGD 4/14 LFTs, renal function improving. More encephalopathic this morning. hgb stable. White count coming down 4/15 continues to be severely encephalopathic>head CT neg; progressive hypernatremia 148>151 4/16 improved mental status, improved renal function; extubated; off pressors 4/17 remains encephalopathic  Consults:  GI IR Procedures:  4/10 Intubation>>4/11 4/10 central line>>  ETT 4/12>>4/16  Significant Diagnostic Tests:  CT A/P 01/13/20>>Mild peripancreatic haziness may  represent acute on chronic pancreatitis. A pancreatic stent with pigtail ends in the second portion of the duodenum and in the body of the stomach noted. No peripancreatic fluid collection or pseudocyst. Bilateral renal parenchyma wedge-shaped infarcts, new since the prior CT.   4/9 CTA a/p>>69mm pseudoaneurysm over a pancreatic branch of the celiac. Evidence of acute hemorrhage.  -Patient bilateral renal arteries with focal narrowing of the proximal 1cm of right renal artery. -interval distension of the stomach/duodenum likely 2/2 hemorrhage. Dilated proximal small bowel loops. Possible penumatosis involving descending colon  -worsening patchy bilateral low attenuation areas over the renal cortex which may be due to infarction -worsening free peritoneal fluid  4/13 CXR>>bibasilar atelectasis and small left effusion  4/13 EGD>>significant amount of clotted blood present in stomach. Superficial gastric ulcers. Diffuse moderate mucosal changes characterized by congestion, discoloration and decreased vascular pattern in the fundus and gastric body with questionable ischemia. No bleeding source apparent  4/15 CXR> possible small left sided effusion. Bibasilar atelectasis  Micro Data:  4/9 Urine culture>> ++ e.faecalis 4/9 COVID Neg 4/16 blood cultures>>NGTD  4/16 resp cultures>>gram neg rods, gram positive cocci  Antimicrobials:  4/9 Zosyn x 1 4/10 Ceftriaxone >> 4/12 Ampicillin 4/12>>4/17  Interim history/subjective:  Extubated yesterday  Objective   Blood pressure 121/67, pulse 88, temperature 99.6 F (37.6 C), temperature source Axillary, resp. rate (!) 42, height 5\' 4"  (1.626 m), weight 56.8 kg, SpO2 98 %.    Vent Mode: CPAP;PSV FiO2 (%):  [30 %] 30 % PEEP:  [5 cmH20] 5 cmH20 Pressure Support:  [5 cmH20-10 cmH20] 5 cmH20 Plateau Pressure:  [15 cmH20] 15 cmH20   Intake/Output Summary (Last 24 hours) at 01/21/2020 0526 Last data filed at 01/21/2020 0500 Gross per 24 hour  Intake  2589.86 ml  Output 675 ml  Net 1914.86  ml   Filed Weights   01/14/20 0108 01/20/20 0500 01/21/20 0500  Weight: 50.7 kg 58 kg 56.8 kg   Examination: General: critically ill appearing female HENT: ETT Pulm: mechanical breath sounds. No wheezes appreciated on exam today CV: tachycardic rate. Upper extremities with +2 pitting edema. No lower extremity edema. Right fingers dusky appearing. Radial pulses appreciated with doppler Abdomen: hypoactive bowel sounds Extremities: cool Skin: no rash Neuro: awake. Oriented to person and place. Continues to have cognitive slowing however. Can follow simple commands.  Resolved Hospital Problem list   --Hemorrhagic shock 2/2 bleeding 63mm pseudoaneurysm and recent instrumentation s/p EGD and coil embolization by IR 4/10 --Shock resolved 4/16 --Acute respiratory failure requiring MV resolved 4/16 --E. Faecalis UTI-completed 5d course of ampicillin 4/16 --Acute blood loss anemia resolved 4/17 --Thrombocytopenia resolved 4/17  Assessment & Plan:    GI bleed>splenic pseudoaneurysm>recent instrumentation for pancreatic stent  A: hgb stable.  P: continue to monitor hemoglobin.   Acute hypoxemic respiratory failure  A: now on 2L [place]. 4/16 tracheal aspirate with gram pos cocci and gram neg rods Leukocytosis A: white count continuing to trend up. Afebrile. No growth on blood cultures. P:  -Hold antibiotics for now -follow cultures  AKI 2/2 ATN A: improving. Continues to have good UOP FeNa 2.6 suggesting intrinsic process consistent with ATN P:  Hypernatremia. Slight increase from 148>151. 1.6 free water deficit. Consider increase free water boluses.  Acute metabolic encephalopathy.  A: improving. Off precedex P: continue oxycodone  High risk Malnutrition. Continue trickle feeds  Hypothyroidism. Continue synthroid.  Best practice:  Diet: NPO Pain/Anxiety/Delirium protocol (if indicated): precedex, cheduled oxy VAP protocol (if  indicated): n/a DVT prophylaxis: SCDs GI prophylaxis: PPI bid Glucose control: SSI Mobility: BR Code Status: Full Family Communication: daughter Disposition: ICU  Elige Radon, MD 01/21/20 5:26 AM

## 2020-01-21 NOTE — Evaluation (Signed)
Physical Therapy Evaluation Patient Details Name: Natasha Jacobson MRN: [id number] DOB: [birth year] Today's Date: 01/21/2020   History of Present Illness  Pt is a 69 yo female admitted with hemorrhagic shock 2/2 bleeding 14mm pseudoaneurysm and recent instrumentation s/p EGD and coil embolization by IR 4/10. Course also significant for UTI, acute on chronic pancreatitis, and ETT 4/10-4/11; reintubated for encephalopathy and hypoxia 4/12-4/16. PMH also includes: COPD, hearing loss, HTN, thyroid disease    Clinical Impression  Pt admitted with above diagnosis. PTA pt lived alone, active and independent. On eval, she required total assist rolling due to pt resisting mobility attempts. Pt unwilling to attempt transition to EOB. Pt did demonstrate AROM BUE/LE in supine. Edema noted bilat hands and feet, all being tender to touch. R hand being the worst. Daughter present in room to provide home set up/prior level. She reports her mom is fiercely independent, always on the go, and very stubborn.  Pt currently with functional limitations due to the deficits listed below (see PT Problem List). Pt will benefit from skilled PT to increase their independence and safety with mobility to allow discharge to the venue listed below.       Follow Up Recommendations CIR    Equipment Recommendations  Other (comment)(TBD)    Recommendations for Other Services Rehab consult     Precautions / Restrictions Precautions Precautions: Fall;Other (comment) Precaution Comments: cortrak      Mobility  Bed Mobility Overal bed mobility: Needs Assistance Bed Mobility: Rolling Rolling: Total assist         General bed mobility comments: total assist due to pt resisting  Transfers                 General transfer comment: unable/pt unwilling to attempt. Resisting mobility attempts.  Ambulation/Gait                Stairs            Wheelchair Mobility    Modified Rankin (Stroke Patients  Only)       Balance                                             Pertinent Vitals/Pain Pain Assessment: Faces Faces Pain Scale: Hurts whole lot Pain Location: bilat hands and feet (R hand > L hand, bilat feet) Pain Descriptors / Indicators: Grimacing;Guarding;Tender;Discomfort Pain Intervention(s): Limited activity within patient's tolerance;Monitored during session    Home Living Family/patient expects to be discharged to:: Private residence Living Arrangements: Alone Available Help at Discharge: Family(may be able to provide 24-hour assist) Type of Home: House Home Access: Level entry     Home Layout: One level Home Equipment: None Additional Comments: Daughter present in room to provide information. Daughter lives in [place] and is working on moving back to [place] to take care of her mom.    Prior Function Level of Independence: Independent         Comments: Very active and independent. Maintains her land, pond and animals.     Hand Dominance        Extremity/Trunk Assessment   Upper Extremity Assessment Upper Extremity Assessment: Generalized weakness;RUE deficits/detail;LUE deficits/detail RUE Deficits / Details: edema noted. Hand very tender to touch. LUE Deficits / Details: edema noted.    Lower Extremity Assessment Lower Extremity Assessment: Generalized weakness;RLE deficits/detail;LLE deficits/detail RLE Deficits / Details: edema noted bilat feet. Tender  to touch. grossly 3/5 strength RLE: Unable to fully assess due to pain LLE Deficits / Details: edema noted bilat feet. Tender to touch. grossly 3/5 strength       Communication      Cognition Arousal/Alertness: Awake/alert Behavior During Therapy: Agitated;Flat affect Overall Cognitive Status: Difficult to assess                                 General Comments: Very little verbalizations. Following commands to squeeze hand and wiggle feet. Resisting all mobility  attempts. Alert with eyes open.      General Comments General comments (skin integrity, edema, etc.): Pt on RA with SpO2 94%.    Exercises     Assessment/Plan    PT Assessment Patient needs continued PT services  PT Problem List Decreased strength;Decreased mobility;Decreased activity tolerance;Decreased balance;Decreased cognition;Pain;Decreased knowledge of use of DME;Decreased safety awareness       PT Treatment Interventions DME instruction;Therapeutic activities;Cognitive remediation;Gait training;Therapeutic exercise;Patient/family education;Stair training;Balance training;Functional mobility training    PT Goals (Current goals can be found in the Care Plan section)  Acute Rehab PT Goals Patient Stated Goal: home per daughter PT Goal Formulation: With patient/family Time For Goal Achievement: 02/04/20 Potential to Achieve Goals: Good    Frequency Min 3X/week   Barriers to discharge        Co-evaluation               AM-PAC PT "6 Clicks" Mobility  Outcome Measure Help needed turning from your back to your side while in a flat bed without using bedrails?: Total Help needed moving from lying on your back to sitting on the side of a flat bed without using bedrails?: Total Help needed moving to and from a bed to a chair (including a wheelchair)?: Total Help needed standing up from a chair using your arms (e.g., wheelchair or bedside chair)?: Total Help needed to walk in hospital room?: Total Help needed climbing 3-5 steps with a railing? : Total 6 Click Score: 6    End of Session   Activity Tolerance: Patient limited by pain;Treatment limited secondary to agitation Patient left: in bed;with call bell/phone within reach;with family/visitor present Nurse Communication: Mobility status PT Visit Diagnosis: Other abnormalities of gait and mobility (R26.89);Muscle weakness (generalized) (M62.81)    Time: 6834-1962 PT Time Calculation (min) (ACUTE ONLY): 18  min   Charges:   PT Evaluation $PT Eval Moderate Complexity: 1 Mod          Lorrin Goodell, PT  Office # [phone] Pager [phone]   Natasha Jacobson 01/21/2020, 2:37 PM

## 2020-01-21 NOTE — Progress Notes (Signed)
eLink Physician-Brief Progress Note Patient Name: Natasha Jacobson DOB: 02-18-1951 MRN: [id number]   Date of Service  01/21/2020  HPI/Events of Note  K+ = 2.6 and Creatinine = 2.67.  eICU Interventions  Will replace K+.     Intervention Category Major Interventions: Electrolyte abnormality - evaluation and management  Lenell Antu 01/21/2020, 6:36 AM

## 2020-01-21 NOTE — Evaluation (Signed)
Clinical/Bedside Swallow Evaluation Patient Details  Name: Natasha Jacobson MRN: [id number] Date of Birth: 08/06/1951  Today's Date: 01/21/2020 Time: SLP Start Time (ACUTE ONLY): 0816 SLP Stop Time (ACUTE ONLY): 0836 SLP Time Calculation (min) (ACUTE ONLY): 20 min  Past Medical History:  Past Medical History:  Diagnosis Date  . 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 Alcohol-induced chronic pancreatitis (HCC)   . Brittle bone disease   . Chronic pain syndrome   . COPD (chronic obstructive pulmonary disease) (HCC)   . Hearing loss, right 07/08/2016  . Hepatitis C    Eradicated with antiviral treatment per patient  . High cholesterol   . Hypertension   . Hypopotassemia   . Hypothyroidism 07/08/2016  . Prediabetes   . SSS (sick sinus syndrome) (HCC) 09/06/2015  . Thyroid disease   . Vitamin D deficiency    Past Surgical History:  Past Surgical History:  Procedure Laterality Date  . ABDOMINAL HYSTERECTOMY    . APPENDECTOMY    . ERCP     multiple with stents placed   . ESOPHAGOGASTRODUODENOSCOPY N/A 01/17/2020   Procedure: ESOPHAGOGASTRODUODENOSCOPY (EGD);  Surgeon: Lemar Lofty., MD;  Location: [facility] ENDOSCOPY;  Service: Gastroenterology;  Laterality: N/A;  . ESOPHAGOGASTRODUODENOSCOPY (EGD) WITH PROPOFOL N/A 01/14/2020   Procedure: ESOPHAGOGASTRODUODENOSCOPY (EGD) WITH PROPOFOL;  Surgeon: Benancio Deeds, MD;  Location: [facility] ENDOSCOPY;  Service: Gastroenterology;  Laterality: N/A;  . FOREIGN BODY REMOVAL  01/17/2020   Procedure: FOREIGN BODY REMOVAL;  Surgeon: Meridee Score Netty Starring., MD;  Location: [facility] ENDOSCOPY;  Service: Gastroenterology;;  gastric lavage  . IR ANGIOGRAM SELECTIVE EACH ADDITIONAL VESSEL  01/15/2020  . IR ANGIOGRAM VISCERAL SELECTIVE  01/15/2020  . IR EMBO ART  VEN HEMORR LYMPH EXTRAV  INC GUIDE ROADMAPPING  01/15/2020  . IR US GUIDE VASC ACCESS RIGHT  01/15/2020  . PACEMAKER IMPLANT    . TONSILLECTOMY     HPI:   Pt is a 69 yo female admitted with hemorrhagic shock 2/2 bleeding 66mm pseudoaneurysm and recent instrumentation s/p EGD and coil embolization by IR 4/10. Course also significant for UTI, acute on chronic pancreatitis, and ETT 4/10-4/11; reintubated for encephalopathy and hypoxia 4/12-4/16. PMH also includes: COPD, hearing loss, HTN, thyroid disease   Assessment / Plan / Recommendation Clinical Impression  Pt is generally weak with hoarse vocal quality and altered mentation, with consistent s/s concerning for aspiration with ice chips and small amounts of water. She spontaneously coughs with all trials but cough sounds very weak, at times congested, and she does not cough to command. She says that she can't because of pain, also sharing that she does not have desire to eat or drink today. Her oral hygiene with limited dentition in poor condition could further impact risk for aspiration-related complications. Recommend that she remain NPO for today with use of temporary, alternative access for nutrition/meds as needed (has cortrak). Would focus on frequent oral care today, and SLP will continue to follow for readiness to start PO diet versus need for instrumental testing.   SLP Visit Diagnosis: Dysphagia, unspecified (R13.10)    Aspiration Risk  Moderate aspiration risk    Diet Recommendation NPO;Alternative means - temporary   Medication Administration: Via alternative means    Other  Recommendations Oral Care Recommendations: Oral care QID Other Recommendations: Have oral suction available   Follow up Recommendations (tba)      Frequency and Duration min 2x/week  2 weeks       Prognosis Prognosis  for Safe Diet Advancement: Good Barriers to Reach Goals: Cognitive deficits      Swallow Study   General HPI: Pt is a 69 yo female admitted with hemorrhagic shock 2/2 bleeding 12mm pseudoaneurysm and recent instrumentation s/p EGD and coil embolization by IR 4/10. Course also significant for  UTI, acute on chronic pancreatitis, and ETT 4/10-4/11; reintubated for encephalopathy and hypoxia 4/12-4/16. PMH also includes: COPD, hearing loss, HTN, thyroid disease Type of Study: Bedside Swallow Evaluation Previous Swallow Assessment: none in cahrt Diet Prior to this Study: NPO;NG Tube Temperature Spikes Noted: No Respiratory Status: Nasal cannula History of Recent Intubation: Yes Length of Intubations (days): 5 days(across two intubations) Date extubated: 01/20/20 Behavior/Cognition: Alert;Distractible;Requires cueing Oral Cavity Assessment: Dry Oral Care Completed by SLP: Recent completion by staff Oral Cavity - Dentition: Poor condition;Missing dentition Self-Feeding Abilities: Total assist Patient Positioning: Upright in bed Baseline Vocal Quality: Hoarse;Low vocal intensity Volitional Cough: Other (Comment)(pt says she cannot cough; spontaneous cough sounds weak) Volitional Swallow: Unable to elicit    Oral/Motor/Sensory Function Overall Oral Motor/Sensory Function: Generalized oral weakness(difficulty following commands)   Ice Chips Ice chips: Impaired Presentation: Spoon Oral Phase Impairments: Reduced lingual movement/coordination Pharyngeal Phase Impairments: Suspected delayed Swallow;Cough - Immediate   Thin Liquid Thin Liquid: Impaired Presentation: Spoon Oral Phase Impairments: Reduced lingual movement/coordination Pharyngeal  Phase Impairments: Suspected delayed Swallow;Cough - Immediate    Nectar Thick Nectar Thick Liquid: Not tested   Honey Thick Honey Thick Liquid: Not tested   Puree Puree: Not tested   Solid     Solid: Not tested       Osie Bond., M.A. [place] Pager [phone] Office [phone]  01/21/2020,8:41 AM

## 2020-01-21 NOTE — Progress Notes (Signed)
PT Cancellation Note  Patient Details Name: Natasha Jacobson MRN: [id number] DOB: 1950-12-29   Cancelled Treatment:    Reason Eval/Treat Not Completed: Patient declined, no reason specified. Pt adamantly refusing participation in PT eval. Encouragement provided but pt continued to decline participation. PT to re-attempt.   Ilda Foil 01/21/2020, 9:43 AM   Aida Raider, PT  Office # [phone] Pager [phone]

## 2020-01-21 NOTE — Progress Notes (Signed)
Rehab Admissions Coordinator Note:  Per PT recommendation, this patient was screened by Cheri Rous for appropriateness for an Inpatient Acute Rehab Consult.  Noted pt resistant to mobility attempts at this time. AC will continue to follow pt progress and participation with therapy prior to requesting consult order.   Cheri Rous 01/21/2020, 4:17 PM  I can be reached at [phone].

## 2020-01-22 ENCOUNTER — Inpatient Hospital Stay (HOSPITAL_COMMUNITY): Payer: Medicare Other

## 2020-01-22 ENCOUNTER — Other Ambulatory Visit: Payer: Self-pay

## 2020-01-22 DIAGNOSIS — D649 Anemia, unspecified: Secondary | ICD-10-CM

## 2020-01-22 DIAGNOSIS — J449 Chronic obstructive pulmonary disease, unspecified: Secondary | ICD-10-CM

## 2020-01-22 DIAGNOSIS — K8500 Idiopathic acute pancreatitis without necrosis or infection: Secondary | ICD-10-CM | POA: Diagnosis not present

## 2020-01-22 DIAGNOSIS — K859 Acute pancreatitis without necrosis or infection, unspecified: Secondary | ICD-10-CM | POA: Diagnosis not present

## 2020-01-22 DIAGNOSIS — G9340 Encephalopathy, unspecified: Secondary | ICD-10-CM | POA: Diagnosis not present

## 2020-01-22 DIAGNOSIS — N179 Acute kidney failure, unspecified: Secondary | ICD-10-CM | POA: Diagnosis not present

## 2020-01-22 DIAGNOSIS — E039 Hypothyroidism, unspecified: Secondary | ICD-10-CM

## 2020-01-22 LAB — COMPREHENSIVE METABOLIC PANEL
ALT: 83 U/L — ABNORMAL HIGH (ref 0–44)
AST: 48 U/L — ABNORMAL HIGH (ref 15–41)
Albumin: 1.5 g/dL — ABNORMAL LOW (ref 3.5–5.0)
Alkaline Phosphatase: 76 U/L (ref 38–126)
Anion gap: 8 (ref 5–15)
BUN: 101 mg/dL — ABNORMAL HIGH (ref 8–23)
CO2: 21 mmol/L — ABNORMAL LOW (ref 22–32)
Calcium: 7.4 mg/dL — ABNORMAL LOW (ref 8.9–10.3)
Chloride: 120 mmol/L — ABNORMAL HIGH (ref 98–111)
Creatinine, Ser: 2.17 mg/dL — ABNORMAL HIGH (ref 0.44–1.00)
GFR calc Af Amer: 27 mL/min — ABNORMAL LOW (ref >60)
GFR calc non Af Amer: 23 mL/min — ABNORMAL LOW (ref >60)
Glucose, Bld: 157 mg/dL — ABNORMAL HIGH (ref 70–99)
Potassium: 2.9 mmol/L — ABNORMAL LOW (ref 3.5–5.1)
Sodium: 149 mmol/L — ABNORMAL HIGH (ref 135–145)
Total Bilirubin: 1.1 mg/dL (ref 0.3–1.2)
Total Protein: 4.3 g/dL — ABNORMAL LOW (ref 6.5–8.1)

## 2020-01-22 LAB — BASIC METABOLIC PANEL
Anion gap: 11 (ref 5–15)
BUN: 94 mg/dL — ABNORMAL HIGH (ref 8–23)
CO2: 22 mmol/L (ref 22–32)
Calcium: 7.6 mg/dL — ABNORMAL LOW (ref 8.9–10.3)
Chloride: 117 mmol/L — ABNORMAL HIGH (ref 98–111)
Creatinine, Ser: 2.03 mg/dL — ABNORMAL HIGH (ref 0.44–1.00)
GFR calc Af Amer: 29 mL/min — ABNORMAL LOW (ref >60)
GFR calc non Af Amer: 25 mL/min — ABNORMAL LOW (ref >60)
Glucose, Bld: 148 mg/dL — ABNORMAL HIGH (ref 70–99)
Potassium: 3.3 mmol/L — ABNORMAL LOW (ref 3.5–5.1)
Sodium: 150 mmol/L — ABNORMAL HIGH (ref 135–145)

## 2020-01-22 LAB — CULTURE, RESPIRATORY W GRAM STAIN

## 2020-01-22 LAB — GLUCOSE, CAPILLARY
Glucose-Capillary: 118 mg/dL — ABNORMAL HIGH (ref 70–99)
Glucose-Capillary: 118 mg/dL — ABNORMAL HIGH (ref 70–99)
Glucose-Capillary: 120 mg/dL — ABNORMAL HIGH (ref 70–99)
Glucose-Capillary: 127 mg/dL — ABNORMAL HIGH (ref 70–99)
Glucose-Capillary: 128 mg/dL — ABNORMAL HIGH (ref 70–99)
Glucose-Capillary: 134 mg/dL — ABNORMAL HIGH (ref 70–99)

## 2020-01-22 LAB — VITAMIN B12: Vitamin B-12: 1469 pg/mL — ABNORMAL HIGH (ref 180–914)

## 2020-01-22 LAB — TSH: TSH: 8.308 u[IU]/mL — ABNORMAL HIGH (ref 0.350–4.500)

## 2020-01-22 LAB — CBC
HCT: 35.2 % — ABNORMAL LOW (ref 36.0–46.0)
Hemoglobin: 11.8 g/dL — ABNORMAL LOW (ref 12.0–15.0)
MCH: 30.8 pg (ref 26.0–34.0)
MCHC: 33.5 g/dL (ref 30.0–36.0)
MCV: 91.9 fL (ref 80.0–100.0)
Platelets: 287 10*3/uL (ref 150–400)
RBC: 3.83 MIL/uL — ABNORMAL LOW (ref 3.87–5.11)
RDW: 15.9 % — ABNORMAL HIGH (ref 11.5–15.5)
WBC: 24.9 10*3/uL — ABNORMAL HIGH (ref 4.0–10.5)
nRBC: 0.4 % — ABNORMAL HIGH (ref 0.0–0.2)

## 2020-01-22 LAB — AMMONIA: Ammonia: 44 umol/L — ABNORMAL HIGH (ref 9–35)

## 2020-01-22 LAB — T4, FREE: Free T4: 0.7 ng/dL (ref 0.61–1.12)

## 2020-01-22 MED ORDER — IPRATROPIUM-ALBUTEROL 0.5-2.5 (3) MG/3ML IN SOLN: 3.0000 mL | Freq: Three times a day (TID) | RESPIRATORY_TRACT | Status: DC | PRN

## 2020-01-22 MED ORDER — POTASSIUM CHLORIDE 10 MEQ/100ML IV SOLN
10.0000 meq | INTRAVENOUS | Status: DC
  Administered 2020-01-22 (×2): 10 meq via INTRAVENOUS
  Filled 2020-01-22 (×2): qty 100

## 2020-01-22 MED ORDER — IPRATROPIUM-ALBUTEROL 0.5-2.5 (3) MG/3ML IN SOLN
3.0000 mL | Freq: Three times a day (TID) | RESPIRATORY_TRACT | Status: DC
  Administered 2020-01-22: 3 mL via RESPIRATORY_TRACT
  Filled 2020-01-22: qty 3

## 2020-01-22 MED ORDER — IPRATROPIUM-ALBUTEROL 0.5-2.5 (3) MG/3ML IN SOLN: 3.0000 mL | Freq: Three times a day (TID) | RESPIRATORY_TRACT | Status: DC

## 2020-01-22 MED ORDER — LACTULOSE 10 GM/15ML PO SOLN
20.0000 g | Freq: Three times a day (TID) | ORAL | Status: DC
  Administered 2020-01-22 – 2020-01-23 (×4): 20 g via ORAL
  Filled 2020-01-22 (×4): qty 30

## 2020-01-22 MED ORDER — FUROSEMIDE 10 MG/ML IJ SOLN
40.0000 mg | Freq: Two times a day (BID) | INTRAMUSCULAR | Status: AC
  Administered 2020-01-22 (×2): 40 mg via INTRAVENOUS
  Filled 2020-01-22 (×2): qty 4

## 2020-01-22 MED ORDER — MORPHINE SULFATE (PF) 2 MG/ML IV SOLN
2.0000 mg | INTRAVENOUS | Status: AC | PRN
  Administered 2020-01-22 (×3): 2 mg via INTRAVENOUS
  Filled 2020-01-22 (×3): qty 1

## 2020-01-22 MED ORDER — POTASSIUM CHLORIDE 10 MEQ/50ML IV SOLN
10.0000 meq | INTRAVENOUS | Status: AC
  Administered 2020-01-22 (×4): 10 meq via INTRAVENOUS
  Filled 2020-01-22 (×4): qty 50

## 2020-01-22 NOTE — Progress Notes (Signed)
  Speech Language Pathology Treatment: Dysphagia  Patient Details Name: Natasha Jacobson MRN: [id number] DOB: [birth year] Today's Date: 01/22/2020 Time: 4599-7741 SLP Time Calculation (min) (ACUTE ONLY): 21 min  Assessment / Plan / Recommendation Clinical Impression  Patient seen for skilled dysphagia therapy. Oral care was provided. Pt was willing to try some ice chips and tsp sips of water. Her voice was very low and hoarse and her cough was congested at baseline. Her oral motor movement was very limited and weak. Very dry oral mucosa. She welcomed an ice chip, but unable to manipulated to move around mouth with her tongue. She did not close her lips to swallow with ice chip or tsp of water. After water, she had a delayed congested cough response. No further trials due to high risk of aspiration. ST to follow up for PO readiness and need for instrumental evaluation.    HPI HPI: Pt is a 69 yo female admitted with hemorrhagic shock 2/2 bleeding 74mm pseudoaneurysm and recent instrumentation s/p EGD and coil embolization by IR 4/10. Course also significant for UTI, acute on chronic pancreatitis, and ETT 4/10-4/11; reintubated for encephalopathy and hypoxia 4/12-4/16. PMH also includes: COPD, hearing loss, HTN, thyroid disease      SLP Plan  Continue with current plan of care       Recommendations  Diet recommendations: NPO                Plan: Continue with current plan of care       GO                Luis Abed., MA CCC-SLP 01/22/2020, 11:28 AM

## 2020-01-22 NOTE — Significant Event (Signed)
Rapid Response Event Note  Overview: Respiratory   Received a call from the nurse with concerns that as the day has gone on, the patient's respiratory status has not improved much. Patient was diuresed this morning, but still appears overloaded, and her RR is still 35-38 with mild accessory muscle use. I came up to see the patient as well - patient appears overall the same - she still endorses significant pain in her arm and generalized all over. She appears restless and feels anxious and fearful. Patient is not overt respiratory distress. 99% on RA, HR 84, normotensive SBP in the 140s. Patient has a spontaneous cough but it is quite weak  The situation is tenuous - BIPAP is not recommended given that the patient has a Cortrak feeding tube in place and given that her cough is so weak and she cannot cough up her secretions - high risk for aspiration. HHFNC would be useful for the increased WOB but the patient does not require any oxygen.   I spoke with the [facility] MD, updated MD. Per MD will given Morphine 2 mg IV x 1 for pain. Reassess the patient. If the patient is still restless and anxious then administer Ativan 1 mg IV. Also TF rate to be cut in half.   Plan: -- RN to give Morphine 2 mg IV first and reassess patient. -- If needed Ativan 1 mg IV -- TF rate reduced by half    Lisel Siegrist R

## 2020-01-22 NOTE — Progress Notes (Addendum)
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  Received mass transfusion, admitted to the ICU.  EGD showed active upper GI bleeding with difficulty locating active bleed, CT abdomen showed 7 mm pseudoaneurysm over pancreatic branch probably the culprit for hemorrhage.  IR performed selective splenic arteriogram and coil embolization of pseudoaneurysm.  Initially extubated 4/11 and then reintubated due to increased work of breathing.  CT head negative, had progressively worsening encephalopathy with electrolyte abnormality   Assessment & Plan:   Principal Problem:   Pancreatitis, acute Active Problems:   COPD (chronic obstructive pulmonary disease) (HCC)   Hypothyroidism   Essential hypertension   Acute pancreatitis   Gastrointestinal hemorrhage   Acute respiratory failure (HCC)   AKI (acute kidney injury) (HCC)   Anemia   Hemorrhagic shock (HCC)   Acute encephalopathy   Acute hypoxic respiratory failure Tachypnea -Suspect fluid overload.  Order chest x-ray, stop IV fluids. -Lasix IV 40 mg twice daily ordered -Monitor tracheal aspirate cultures. -Supplemental oxygen as needed. -Incentive spirometer, bronchodilators.  Acute metabolic encephalopathy -Suspect secondary to ongoing illness.  No focal neuro deficits appreciated at this time.  Suspect underlying uremia -Check ammonia levels, sh, B12, folate  Ammonia level is elevated-lactulose 3 times daily started TSH elevated, check free T4.  Acute kidney injury -Baseline creatinine 1.0.  Admission creatinine 1.6, peaked at 3.2.  Slowly trending downwards -BUN still elevated but improving. -Monitor urine output.  Hemorrhagic shock  secondary to bleeding splenic pseudoaneurysm -Status post embolization.  Received massive PRBC transfusion. -Seen by GI and IR -Status post 10 units PRBC transfusion,, 1 unit platelet -Protonix 40 mg IV every 12  Right upper extremity pain and swelling -Elevate arm.  Venous duplex ordered-rule out DVT -Pain control  Hypernatremia Hypokalemia -Aggressive repletion.  Would benefit from free water but currently had to stop it due to tachypnea.  IV Lasix ordered.  Enterococcus faecalis urinary tract infection -Received IV ampicillin, completed course.  Hypothyroidism -Synthroid 50 mcg  Currently patient is n.p.o. per speech and swallow evaluation   DVT prophylaxis: SCDs Code Status: Full code Family Communication: None Disposition Plan:   Patient From= home  Patient Anticipated D/C place= to be determined, PT recommended CIR  Barriers= maintain patient in the hospital given multiple ongoing issues including tachypnea, encephalopathy, acute kidney injury, multiple electrolyte normalities.  Unsafe for discharge.   Procedures:   CT abdomen pelvis that showed acute on chronic pancreatitis.  Bilateral renal parenchymal wedge shaped infarct  CTA abdomen pelvis-7 mm pseudoaneurysm concern for active bleeding.  EGD-clotted blood in the stomach, superficial gastric ulcer, diffuse mucosal congestion difficult to identify active source of bleeding  Antimicrobials:   Zosyn X1 dose-4/9  Rocephin 4/10-4/12  Ampicillin 4/12-4/17   Subjective: Seen and examined at bedside this morning.  Patient appears to be tachypneic breathing in 40s saturating greater than 90% on room air.  Following very basic commands and wakes up to her name.  Able to move extremities when asked to.  But extremely weak does not participate in full conversation.  Review of Systems Otherwise negative except as per HPI, including: Difficult to obtain given her mentation  Examination:  Constitutional: Not in  acute distress, NG tube in place Respiratory: Tachypnea with bibasilar crackles Cardiovascular:  Normal sinus rhythm, no rubs Abdomen: Nontender nondistended good bowel sounds Musculoskeletal: Right upper extremity swelling noted, good pulse. Skin: No rashes seen Neurologic: Difficult to fully assess but grossly moving all the extremities when asked to do so Psychiatric: Difficult to assess.  Alert to her name and she wakes up when called.  Objective: Vitals:   01/22/20 0500 01/22/20 0600 01/22/20 0735 01/22/20 0930  BP: (!) 152/82 (!) 156/77    Pulse: 88 84    Resp: (!) 46 (!) 39    Temp:   99.3 F (37.4 C)   TempSrc:   Axillary   SpO2: 97% 95%  100%  Weight:      Height:        Intake/Output Summary (Last 24 hours) at 01/22/2020 1018 Last data filed at 01/22/2020 1014 Gross per 24 hour  Intake 3298.92 ml  Output --  Net 3298.92 ml   Filed Weights   01/20/20 0500 01/21/20 0500 01/22/20 0437  Weight: 58 kg 56.8 kg 56.6 kg     Data Reviewed:   CBC: Recent Labs  Lab 01/18/20 0334 01/18/20 0930 01/19/20 1336 01/19/20 2045 01/20/20 0600 01/20/20 1321 01/20/20 2110 01/21/20 0437 01/22/20 0437  WBC 16.5*  --  18.6*  --  21.7*  --   --  27.0* 24.9*  NEUTROABS 13.7*  --   --   --   --   --   --   --   --   HGB 11.2*   < > 11.7*   < > 11.3* 11.3* 11.6* 11.4* 11.8*  HCT 33.3*   < > 36.0   < > 35.1* 35.0* 36.3 35.0* 35.2*  MCV 91.7  --  93.0  --  95.4  --   --  92.1 91.9  PLT 53*  --  80*  --  111*  --   --  176 287   < > = values in this interval not displayed.   Basic Metabolic Panel: Recent Labs  Lab 01/17/20 0439 01/18/20 0334 01/19/20 0700 01/20/20 0600 01/21/20 0437 01/21/20 1526 01/22/20 0437  NA 148*   < > 151* 152* 149* 152* 149*  K 4.5   < > 3.6 3.1* 2.6* 3.4* 2.9*  CL 109   < > 115* 118* 118* 120* 120*  CO2 25   < > 23 22 21* 20* 21*  GLUCOSE 88   < > 131* 134* 134* 143* 157*  BUN 59*   < > 106* 126* 116* 114* 101*  CREATININE 3.43*   < > 3.24*  3.03* 2.67* 2.46* 2.17*  CALCIUM 7.2*   < > 8.2* 8.0* 7.8* 7.7* 7.4*  MG 2.0  --   --   --   --   --   --   PHOS 6.4*  --   --   --   --   --   --    < > = values in this interval not displayed.   GFR: Estimated Creatinine Clearance: 22 mL/min (A) (by C-G formula based on SCr of 2.17 mg/dL (H)). Liver Function Tests: Recent Labs  Lab 01/18/20 0334 01/19/20 0700 01/20/20 0600 01/21/20 0437 01/22/20 0437  AST 302* 100* 55* 61* 48*  ALT 333* 201* 139* 107* 83*  ALKPHOS 61 55 53 72 76  BILITOT 0.6 1.1 0.7 0.7 1.1  PROT 4.2* 4.1* 3.9* 4.1* 4.3*  ALBUMIN 1.7* 1.6* 1.5* 1.6* 1.5*   No results for input(s): LIPASE, AMYLASE in the last 168 hours. Recent Labs  Lab 01/16/20 1023  AMMONIA 35   Coagulation Profile: Recent Labs  Lab 01/17/20 0439  INR 1.5*   Cardiac Enzymes: No results for input(s): CKTOTAL, CKMB, CKMBINDEX, TROPONINI in the last 168 hours. BNP (last 3 results) No results for input(s): PROBNP in the last 8760 hours. HbA1C: No results for input(s): HGBA1C in the last 72 hours. CBG: Recent Labs  Lab 01/21/20 1527 01/21/20 2001 01/22/20 0015 01/22/20 0421 01/22/20 0734  GLUCAP 126* 128* 128* 118* 134*   Lipid Profile: No results for input(s): CHOL, HDL, LDLCALC, TRIG, CHOLHDL, LDLDIRECT in the last 72 hours. Thyroid Function Tests: No results for input(s): TSH, T4TOTAL, FREET4, T3FREE, THYROIDAB in the last 72 hours. Anemia Panel: No results for input(s): VITAMINB12, FOLATE, FERRITIN, TIBC, IRON, RETICCTPCT in the last 72 hours. Sepsis Labs: Recent Labs  Lab 01/20/20 1321 01/21/20 0437  PROCALCITON 1.41 0.97    Recent Results (from the past 240 hour(s))  SARS CORONAVIRUS 2 (TAT 6-24 HRS) Nasopharyngeal Nasopharyngeal Swab     Status: None   Collection Time: 01/13/20 10:00 PM   Specimen: Nasopharyngeal Swab  Result Value Ref Range Status   SARS Coronavirus 2 NEGATIVE NEGATIVE Final    Comment: (NOTE) SARS-CoV-2 target nucleic acids are NOT  DETECTED. The SARS-CoV-2 RNA is generally detectable in upper and lower respiratory specimens during the acute phase of infection. Negative results do not preclude SARS-CoV-2 infection, do not rule out co-infections with other pathogens, and should not be used as the sole basis for treatment or other patient management decisions. Negative results must be combined with clinical observations, patient history, and epidemiological information. The expected result is Negative. Fact Sheet for Patients: HairSlick.no Fact Sheet for Healthcare Providers: quierodirigir.com This test is not yet approved or cleared by the [place] FDA and  has been authorized for detection and/or diagnosis of SARS-CoV-2 by FDA under an Emergency Use Authorization (EUA). This EUA will remain  in effect (meaning this test can be used) for the duration of the COVID-19 declaration under Section 56 4(b)(1) of the Act, 21 U.S.C. section 360bbb-3(b)(1), unless the authorization is terminated or revoked sooner. Performed at [facility], [address], [place], [place] [address]   Culture, blood (routine x 2)     Status: None   Collection Time: 01/13/20 10:00 PM   Specimen: BLOOD  Result Value Ref Range Status   Specimen Description   Final    BLOOD BLOOD RIGHT ARM Performed at [facility], [address] [place], [place] [address]    Special Requests   Final    BOTTLES DRAWN AEROBIC AND ANAEROBIC Blood Culture adequate volume Performed at [facility], [address] [place], [place] [address]    Culture   Final    NO GROWTH 5 DAYS Performed at [facility], [address] [place], [place] [address]    Report Status 01/19/2020 FINAL  Final  Urine culture     Status: Abnormal   Collection Time: 01/13/20 10:30 PM   Specimen: Urine, Clean Catch  Result Value Ref Range Status   Specimen Description   Final    URINE,  CLEAN CATCH Performed at [facility], [address] [place], [place] [address]    Special Requests   Final    NONE Performed at [facility], [address] [place], [place] [address]    Culture >=100,000 COLONIES/mL ENTEROCOCCUS FAECALIS (A)  Final  Report Status 01/16/2020 FINAL  Final   Organism ID, Bacteria ENTEROCOCCUS FAECALIS (A)  Final      Susceptibility   Enterococcus faecalis - MIC*    AMPICILLIN <=2 SENSITIVE Sensitive     NITROFURANTOIN <=16 SENSITIVE Sensitive     VANCOMYCIN 1 SENSITIVE Sensitive     * >=100,000 COLONIES/mL ENTEROCOCCUS FAECALIS  MRSA PCR Screening     Status: None   Collection Time: 01/14/20 10:21 AM   Specimen: Nasal Mucosa; Nasopharyngeal  Result Value Ref Range Status   MRSA by PCR NEGATIVE NEGATIVE Final    Comment:        The GeneXpert MRSA Assay (FDA approved for NASAL specimens only), is one component of a comprehensive MRSA colonization surveillance program. It is not intended to diagnose MRSA infection nor to guide or monitor treatment for MRSA infections. Performed at [facility], [address] [place], [place] [address]   Culture, Urine     Status: Abnormal   Collection Time: 01/14/20  5:48 PM   Specimen: Urine, Catheterized  Result Value Ref Range Status   Specimen Description URINE, CATHETERIZED  Final   Special Requests   Final    Normal Performed at [facility], [address] [place], [place] [address]    Culture >=100,000 COLONIES/mL ENTEROCOCCUS FAECALIS (A)  Final   Report Status 01/16/2020 FINAL  Final   Organism ID, Bacteria ENTEROCOCCUS FAECALIS (A)  Final      Susceptibility   Enterococcus faecalis - MIC*    AMPICILLIN <=2 SENSITIVE Sensitive     NITROFURANTOIN <=16 SENSITIVE Sensitive     VANCOMYCIN 1 SENSITIVE Sensitive     * >=100,000 COLONIES/mL ENTEROCOCCUS FAECALIS  Culture, respiratory (non-expectorated)     Status: None (Preliminary result)   Collection Time:  01/20/20  9:47 AM   Specimen: Tracheal Aspirate; Respiratory  Result Value Ref Range Status   Specimen Description TRACHEAL ASPIRATE  Final   Special Requests NONE  Final   Gram Stain   Final    FEW WBC PRESENT,BOTH PMN AND MONONUCLEAR MODERATE GRAM NEGATIVE RODS FEW GRAM POSITIVE COCCI RARE GRAM VARIABLE ROD    Culture   Final    MODERATE GRAM NEGATIVE RODS IDENTIFICATION AND SUSCEPTIBILITIES TO FOLLOW Performed at [facility], [address] [place], [place] [address]    Report Status PENDING  Incomplete  Culture, blood (routine x 2)     Status: None (Preliminary result)   Collection Time: 01/20/20 11:00 AM   Specimen: BLOOD RIGHT ARM  Result Value Ref Range Status   Specimen Description BLOOD RIGHT ARM  Final   Special Requests   Final    BOTTLES DRAWN AEROBIC ONLY Blood Culture results may not be optimal due to an inadequate volume of blood received in culture bottles   Culture   Final    NO GROWTH 2 DAYS Performed at [facility], [address] [place], [place] [address]    Report Status PENDING  Incomplete  Culture, blood (routine x 2)     Status: None (Preliminary result)   Collection Time: 01/20/20 11:06 AM   Specimen: BLOOD RIGHT ARM  Result Value Ref Range Status   Specimen Description BLOOD RIGHT ARM  Final   Special Requests   Final    BOTTLES DRAWN AEROBIC ONLY Blood Culture results may not be optimal due to an inadequate volume of blood received in culture bottles   Culture   Final    NO  GROWTH 2 DAYS Performed at [facility], [address] [place], [place] [address]    Report Status PENDING  Incomplete         Radiology Studies: DG CHEST PORT 1 VIEW  Result Date: 01/21/2020 CLINICAL DATA:  Hypoxia, acute pancreatitis EXAM: PORTABLE CHEST 1 VIEW COMPARISON:  01/19/2020 chest radiograph. FINDINGS: Enteric tube enters stomach with the tip not seen on this image. 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 Stable  cardiomediastinal silhouette with normal heart size. No pneumothorax. Small bilateral pleural effusions are similar. No pulmonary edema. Stable small calcified granulomas scattered in the right lung. Hazy left lung base opacity is similar. IMPRESSION: 1. Small bilateral pleural effusions, similar. 2. Stable hazy left lung base opacity, favor atelectasis. Electronically Signed   By: Delbert Phenix M.D.   On: 01/21/2020 13:20    Scheduled Meds: . sodium chloride   Intravenous Once  . sodium chloride   Intravenous Once  . sodium chloride   Intravenous Once  . chlorhexidine  15 mL Mouth Rinse BID  . Chlorhexidine Gluconate Cloth  6 each Topical Daily  . feeding supplement (PRO-STAT SUGAR FREE 64)  30 mL Per Tube TID  . free water  200 mL Per Tube Q4H  . furosemide  40 mg Intravenous BID  . insulin aspart  0-6 Units Subcutaneous Q4H  . levothyroxine  50 mcg Per Tube Q0600  . mouth rinse  15 mL Mouth Rinse q12n4p  . oxyCODONE  5 mg Per Tube Q12H  . pantoprazole (PROTONIX) IV  40 mg Intravenous Q12H  . sodium chloride flush  10-40 mL Intracatheter Q12H   Continuous Infusions: . sodium chloride 999 mL/hr at 01/14/20 1058  . sodium chloride Stopped (01/20/20 2116)  . feeding supplement (VITAL AF 1.2 CAL) 1,000 mL (01/22/20 0237)  . potassium chloride       LOS: 9 days   Time spent= 40 mins    Rishit Burkhalter Joline Maxcy, MD [facility]  If 7PM-7AM, please contact night-coverage  01/22/2020, 10:18 AM

## 2020-01-22 NOTE — Progress Notes (Addendum)
Was reported from daytime RN that patient has significant +3 swelling on RUE. Patient is complaining of pain in that arm. Nailbed cyanosis and capillary refill greater than 3 was noted during this RN's assessment. Radial pulses can be doppler. This finding was not written in any MD note. Will pass on to AM RN to ensure MD is aware. Will continue to monitor.   Rebbeca Paul, RN.   ADDENDUM: Notified by AM staff regarding swelling. Appears unchanged in exam yesterday. Will obtain RUE [place] to rule out DVT.  Patient ok to transfer to floor and [facility] primary service  Mechele Collin, M.D. [facility] Pulmonary/Critical Care Medicine 01/22/2020 7:41 AM

## 2020-01-22 NOTE — Significant Event (Signed)
Rapid Response Event Note  Overview: Respiratory   Initial Focused Assessment: I was on 6E rounding and was asked to see the patient for increased RR and effort. Per nurse, patient transferred to PCU from the ICU this morning at 0830. Upon arrival, patient's RR was 35-38 - mild increased WOB - effort was shallow and rapid. Patient endorsed that she felt like she was working a little hard to breath. Lung sounds - coarse crackles throughout and in the bases. + JVD, + 2 BUE edema - RUE - quite painful to touch pulse + doppler only. +3 Pedal edema bilaterally. Patient is not in overt distress but appears fatigued overall.   Nurse had already paged [facility] MD, received orders for CXR, Lasix 40 mg IV, IV KCL (I had RPH change this to the concentrated bags to help with volume reduction), nebs, and morphine IV for pain.   Interventions: -- NO RRT INTERVENTIONS  -- Nurse to follow MD orders and reassess patient after interventions are completed.   Plan of Care: -- Monitor VS and respiratory status -- I/Os -- Encourage IS and OOB (PT/OT) -- Should the patient needed BIPAP for ongoing distress - that could be utilized as well.   Event Summary: Start Time 1005 End Time 1025   Estevon Fluke R

## 2020-01-22 NOTE — Progress Notes (Signed)
Received patient from 61M this AM. Upon assessment, patient found to have tachypnea with RR 35-45. Patient with labored breathing, JVD, pitting edema to all extremities, and cyanosis to nailbeds on all extremities. O2 saturations are 95-100% on room air at all times. Throughout the shift, respiratory rate did not improve despite multiple interventions including administration of IV morphine, IV ativan, and IV lasix. Patient has had significant urine output post IV lasix administration although most of this was unable to be measured due to incontinence. Patient continues to have tachypnea with RR 35-45. O2 sats remain 95-100% on room air. TF infusing at low rate of 20 mL/hour, per Dr. Nelson Chimes, leave at this rate for now. MD and rapid response RN updated throughout the day and have assessed patient. Bedside report given to Gavin Pound, RN at change of shift.

## 2020-01-23 ENCOUNTER — Inpatient Hospital Stay (HOSPITAL_COMMUNITY): Payer: Medicare Other

## 2020-01-23 ENCOUNTER — Other Ambulatory Visit: Payer: Self-pay

## 2020-01-23 ENCOUNTER — Encounter (HOSPITAL_COMMUNITY): Payer: Medicare Other

## 2020-01-23 DIAGNOSIS — J9600 Acute respiratory failure, unspecified whether with hypoxia or hypercapnia: Secondary | ICD-10-CM

## 2020-01-23 DIAGNOSIS — N179 Acute kidney failure, unspecified: Secondary | ICD-10-CM | POA: Diagnosis not present

## 2020-01-23 DIAGNOSIS — R609 Edema, unspecified: Secondary | ICD-10-CM

## 2020-01-23 DIAGNOSIS — G9340 Encephalopathy, unspecified: Secondary | ICD-10-CM | POA: Diagnosis not present

## 2020-01-23 DIAGNOSIS — K8500 Idiopathic acute pancreatitis without necrosis or infection: Secondary | ICD-10-CM

## 2020-01-23 DIAGNOSIS — K859 Acute pancreatitis without necrosis or infection, unspecified: Secondary | ICD-10-CM | POA: Diagnosis not present

## 2020-01-23 LAB — CBC
HCT: 35.1 % — ABNORMAL LOW (ref 36.0–46.0)
Hemoglobin: 11.8 g/dL — ABNORMAL LOW (ref 12.0–15.0)
MCH: 31.1 pg (ref 26.0–34.0)
MCHC: 33.6 g/dL (ref 30.0–36.0)
MCV: 92.4 fL (ref 80.0–100.0)
Platelets: 411 10*3/uL — ABNORMAL HIGH (ref 150–400)
RBC: 3.8 MIL/uL — ABNORMAL LOW (ref 3.87–5.11)
RDW: 16.2 % — ABNORMAL HIGH (ref 11.5–15.5)
WBC: 24.3 10*3/uL — ABNORMAL HIGH (ref 4.0–10.5)
nRBC: 0.1 % (ref 0.0–0.2)

## 2020-01-23 LAB — COMPREHENSIVE METABOLIC PANEL
ALT: 60 U/L — ABNORMAL HIGH (ref 0–44)
AST: 36 U/L (ref 15–41)
Albumin: 1.5 g/dL — ABNORMAL LOW (ref 3.5–5.0)
Alkaline Phosphatase: 98 U/L (ref 38–126)
Anion gap: 14 (ref 5–15)
BUN: 93 mg/dL — ABNORMAL HIGH (ref 8–23)
CO2: 23 mmol/L (ref 22–32)
Calcium: 7.7 mg/dL — ABNORMAL LOW (ref 8.9–10.3)
Chloride: 114 mmol/L — ABNORMAL HIGH (ref 98–111)
Creatinine, Ser: 2.02 mg/dL — ABNORMAL HIGH (ref 0.44–1.00)
GFR calc Af Amer: 29 mL/min — ABNORMAL LOW (ref >60)
GFR calc non Af Amer: 25 mL/min — ABNORMAL LOW (ref >60)
Glucose, Bld: 148 mg/dL — ABNORMAL HIGH (ref 70–99)
Potassium: 2.9 mmol/L — ABNORMAL LOW (ref 3.5–5.1)
Sodium: 151 mmol/L — ABNORMAL HIGH (ref 135–145)
Total Bilirubin: 1.1 mg/dL (ref 0.3–1.2)
Total Protein: 4.7 g/dL — ABNORMAL LOW (ref 6.5–8.1)

## 2020-01-23 LAB — BLOOD GAS, ARTERIAL
Acid-base deficit: 2.7 mmol/L — ABNORMAL HIGH (ref 0.0–2.0)
Bicarbonate: 22.5 mmol/L (ref 20.0–28.0)
FIO2: 100
O2 Saturation: 95.8 %
Patient temperature: 38.8
pCO2 arterial: 49.4 mmHg — ABNORMAL HIGH (ref 32.0–48.0)
pH, Arterial: 7.291 — ABNORMAL LOW (ref 7.350–7.450)
pO2, Arterial: 108 mmHg (ref 83.0–108.0)

## 2020-01-23 LAB — BRAIN NATRIURETIC PEPTIDE: B Natriuretic Peptide: 268.6 pg/mL — ABNORMAL HIGH (ref 0.0–100.0)

## 2020-01-23 LAB — MAGNESIUM: Magnesium: 1.6 mg/dL — ABNORMAL LOW (ref 1.7–2.4)

## 2020-01-23 LAB — GLUCOSE, CAPILLARY
Glucose-Capillary: 126 mg/dL — ABNORMAL HIGH (ref 70–99)
Glucose-Capillary: 134 mg/dL — ABNORMAL HIGH (ref 70–99)
Glucose-Capillary: 136 mg/dL — ABNORMAL HIGH (ref 70–99)
Glucose-Capillary: 144 mg/dL — ABNORMAL HIGH (ref 70–99)
Glucose-Capillary: 152 mg/dL — ABNORMAL HIGH (ref 70–99)
Glucose-Capillary: 167 mg/dL — ABNORMAL HIGH (ref 70–99)

## 2020-01-23 MED ORDER — POTASSIUM CHLORIDE 10 MEQ/50ML IV SOLN
10.0000 meq | INTRAVENOUS | Status: AC
  Administered 2020-01-23 (×6): 10 meq via INTRAVENOUS
  Filled 2020-01-23 (×6): qty 50

## 2020-01-23 MED ORDER — SODIUM CHLORIDE 0.9 % IV SOLN
2.0000 g | INTRAVENOUS | Status: AC
  Administered 2020-01-24 – 2020-01-30 (×7): 2 g via INTRAVENOUS
  Filled 2020-01-23 (×8): qty 2

## 2020-01-23 MED ORDER — FUROSEMIDE 10 MG/ML IJ SOLN: 40.0000 mg | Freq: Once | INTRAMUSCULAR | Status: DC

## 2020-01-23 MED ORDER — FUROSEMIDE 10 MG/ML IJ SOLN
40.0000 mg | Freq: Three times a day (TID) | INTRAMUSCULAR | Status: AC
  Administered 2020-01-23 (×2): 40 mg via INTRAVENOUS
  Filled 2020-01-23 (×2): qty 4

## 2020-01-23 MED ORDER — CHLORHEXIDINE GLUCONATE 0.12% ORAL RINSE (MEDLINE KIT)
15.0000 mL | Freq: Two times a day (BID) | OROMUCOSAL | Status: DC
  Administered 2020-01-24: 15 mL via OROMUCOSAL

## 2020-01-23 MED ORDER — NOREPINEPHRINE 4 MG/250ML-% IV SOLN
0.0000 ug/min | INTRAVENOUS | Status: DC
  Administered 2020-01-24: 30 ug/min via INTRAVENOUS
  Administered 2020-01-24: 20 ug/min via INTRAVENOUS
  Administered 2020-01-24 (×2): 40 ug/min via INTRAVENOUS
  Administered 2020-01-24: 30 ug/min via INTRAVENOUS
  Filled 2020-01-23 (×5): qty 250

## 2020-01-23 MED ORDER — MAGNESIUM SULFATE 2 GM/50ML IV SOLN
2.0000 g | Freq: Once | INTRAVENOUS | Status: AC
  Administered 2020-01-23: 2 g via INTRAVENOUS
  Filled 2020-01-23: qty 50

## 2020-01-23 MED ORDER — MIDAZOLAM HCL 2 MG/2ML IJ SOLN
INTRAMUSCULAR | Status: AC
  Filled 2020-01-23: qty 2

## 2020-01-23 MED ORDER — FENTANYL CITRATE (PF) 100 MCG/2ML IJ SOLN
INTRAMUSCULAR | Status: AC
  Filled 2020-01-23: qty 2

## 2020-01-23 MED ORDER — ORAL CARE MOUTH RINSE: 15.0000 mL | OROMUCOSAL | Status: DC

## 2020-01-23 MED ORDER — POTASSIUM CHLORIDE 10 MEQ/100ML IV SOLN: 10.0000 meq | INTRAVENOUS | Status: DC

## 2020-01-23 MED ORDER — NOREPINEPHRINE 4 MG/250ML-% IV SOLN
INTRAVENOUS | Status: AC
  Administered 2020-01-23: 10 ug/min via INTRAVENOUS
  Filled 2020-01-23: qty 250

## 2020-01-23 MED ORDER — PRO-STAT SUGAR FREE PO LIQD
30.0000 mL | Freq: Every day | ORAL | Status: DC
  Administered 2020-01-24 – 2020-01-25 (×2): 30 mL
  Filled 2020-01-23 (×2): qty 30

## 2020-01-23 MED ORDER — VITAL AF 1.2 CAL PO LIQD
1000.0000 mL | ORAL | Status: DC
  Filled 2020-01-23: qty 1000

## 2020-01-23 MED ORDER — VITAL 1.5 CAL PO LIQD
1000.0000 mL | ORAL | Status: DC
  Administered 2020-01-23: 1000 mL
  Filled 2020-01-23 (×3): qty 1000

## 2020-01-23 MED ORDER — POTASSIUM CHLORIDE 20 MEQ/15ML (10%) PO SOLN
40.0000 meq | Freq: Once | ORAL | Status: AC
  Administered 2020-01-23: 40 meq via ORAL
  Filled 2020-01-23: qty 30

## 2020-01-23 NOTE — Plan of Care (Signed)
  Problem: Clinical Measurements: Goal: Cardiovascular complication will be avoided 01/23/2020 0653 by Horris Latino, RN Outcome: Progressing 01/23/2020 0652 by Horris Latino, RN Outcome: Progressing

## 2020-01-23 NOTE — Progress Notes (Signed)
PULMONARY / CRITICAL CARE MEDICINE   NAME:  Natasha Jacobson, MRN:  [id number], DOB:  [birth year], LOS: 10 ADMISSION DATE:  01/13/2020, CONSULTATION DATE:  01/14/20 REFERRING MD:  Adela Lank, CHIEF COMPLAINT:  AMS  BRIEF HISTORY:    68 year old woman with a hx of alcoholic pancreatitis s/p recent exchange of pancreaticogastrostomy stent at [facility] (4/8) who presented to the ED on 4/9 with hematemesis. On 4/10 she developed hematochezia, encephalopathy, and hypotension. She was transferred to the ICU and taken for emergent EGD that illustrated active upper GI hemorrhage with inadequate visualization for source control. 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 She was subsequently take for selective splenic arteriogram and coiled embolization of the pseudoaneurysm (4/10).   Patient with worsening hypoxic respiratory failure on 4/19/ worsening work of breathing. Minimally responsive and hypotensive. Brought to ICU for further workup and eval. 4/19 re intubated  SIGNIFICANT PAST MEDICAL HISTORY    Past Medical History:  Diagnosis Date  . 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 Alcohol-induced chronic pancreatitis (HCC)   . Brittle bone disease   . Chronic pain syndrome   . COPD (chronic obstructive pulmonary disease) (HCC)   . Hearing loss, right 07/08/2016  . Hepatitis C    Eradicated with antiviral treatment per patient  . High cholesterol   . Hypertension   . Hypopotassemia   . Hypothyroidism 07/08/2016  . Prediabetes   . SSS (sick sinus syndrome) (HCC) 09/06/2015  . Thyroid disease   . Vitamin D deficiency    SIGNIFICANT EVENTS:  4/9 admitted; mass transfusion protocol 4/10 transfer to ICU; EGD, IR embolization 4/11 extubated 4/12 white count remains elevated at 39k, LFTs significantly elevated;  continues to have dark red stools; hgb stable; minimal responsiveness; reintubated for increased work of breathing 4/13 LFTs improving. hgb stable. More responsive this morning. Minimal improvement in respiratory status. Increased BRBPR>>EGD 4/14 LFTs, renal function improving. More encephalopathic this morning. hgb stable. White count coming down 4/15 continues to be severely encephalopathic>head CT neg; progressive hypernatremia 148>151 4/19 Re intubated STUDIES:   CT A/P 01/13/20>>Mild peripancreatic haziness may represent acute on chronic pancreatitis. A pancreatic stent with pigtail ends in the second portion of the duodenum and in the body of the stomach noted. No peripancreatic fluid collection or pseudocyst. Bilateral renal parenchyma wedge-shaped infarcts, new since the prior CT.   4/9 CTA a/p>>32mm pseudoaneurysm over a pancreatic branch of the celiac. Evidence of acute hemorrhage.  -Patient bilateral renal arteries with focal narrowing of the proximal 1cm of right renal artery. -interval distension of the stomach/duodenum likely 2/2 hemorrhage. Dilated proximal small bowel loops. Possible penumatosis involving descending colon  -worsening patchy bilateral low attenuation areas over the renal cortex which may be due to infarction -worsening free peritoneal fluid  4/13 CXR>>bibasilar atelectasis and small left effusion  4/13 EGD>>significant amount of clotted blood present in stomach. Superficial gastric ulcers. Diffuse moderate mucosal changes characterized by congestion, discoloration and decreased vascular pattern in the fundus and gastric body with questionable ischemia. No bleeding source apparent  4/15 CXR> possible small left sided effusion. Bibasilar atelectasis   4/19 CXR with worsening LLL infiltrate CULTURES:  4/9 Urine culture>> ++ e.faecalis 4/9 COVID Neg 4/16 trach aspirate with enterbacter senstiive to cefepime 4/19 pending blood, urine, and resp  culture  ANTIBIOTICS:  4/9  Zosyn x 1 4/10 Ceftriaxone >> 4/12 Ampicillin 4/12>> 4/19 cefepime and vanc  LINES/TUBES:  4/10 Intubation>>4/11 4/10 central line>>  4/12 ETT>> 4/19 central line still in place. May need to be changed if positive blood cx  CONSULTANTS:  GI IR  SUBJECTIVE:  Patient more alert and oriented today.  She is resting comfortably in bed with mechanical ventilation.  CONSTITUTIONAL: BP (!) 85/51   Pulse 85   Temp (!) 101.9 F (38.8 C) (Rectal)   Resp (!) 42   Ht 5\' 4"  (1.626 m)   Wt 57.7 kg   SpO2 96%   BMI 21.83 kg/m   I/O last 3 completed shifts: In: 280 [I.V.:80; IV Piggyback:200] Out: 2800 [Urine:2075; Emesis/NG output:525; Stool:200]     FiO2 (%):  [100 %] 100 %  PHYSICAL EXAM: Physical Exam  Constitutional: She appears distressed.  HENT:  Head: Normocephalic.  Eyes: EOM are normal.  Cardiovascular: Intact distal pulses.  Pulmonary/Chest: She is in respiratory distress. She has no wheezes.  Crackles noted bialterally  Abdominal: Soft. She exhibits no distension.  Musculoskeletal:        General: No edema. Normal range of motion.  Neurological: She is alert.  Skin: Skin is warm. She is diaphoretic.    RESOLVED PROBLEM LIST  Hemorrhagic shock 2/2 bleeding 84mm pseudoaneurysm and recent instrumentation s/p EGD and coil embolization by IR 4/10  ASSESSMENT AND PLAN   Shock-2/2 PNA-enterobacter in sputum  Leukocytosis Have started levo while here -Order procalcitonin today -Repeat blood cultures, repeat sputum cultures, repeat urine culture -Start vanc and cefepime  GI bleed>splenic pseudoaneurysm>recent instrumentation for pancreatic stent  Acute blood loss anemia resolved Patient's hemoglobin stable at 11.3. -Continue to monitor -Trend hgb Q6 for now -IV PPI BID  Thrombocytopenia.  Patient's thrombocytopenia is improving from 80 to 111 - Continue to monitor.   Acute hypoxemic respiratory failure requiring mechanical  ventilation -Patient with acute worsening overnight requiring non rebreather than intubation -Wean Precedex -Initiate spontaneous breathing trials with pressure support 10/5  AKI 2/2 ATN-From 3.24 to 2.02 -No emergent need for CRRT or HD -Avoid neprhotoxins   E. Faecalis urinary tract infection.  P: sp ampicillin finished on th 17th received 6 days of this   Hypothyroidism. Continue synthroid.  SUMMARY OF TODAY'S PLAN:  Patient is more alert and oriented today.  We will try to wean sedation and extubate her when possible.  Best Practice / Goals of Care / Disposition.   Diet: cortrack, tube feeds. Held for now Pain/Anxiety/Delirium protocol (if indicated): Fentanyl drip VAP protocol (if indicated): yes DVT prophylaxis: SCDs GI prophylaxis: PPI bid Glucose control: SSI Mobility: BR Code Status: Full Family Communication: Unable to reach daughter. Did try Disposition: ICU  LABS  Glucose Recent Labs  Lab 01/23/20 0006 01/23/20 0406 01/23/20 0744 01/23/20 1150 01/23/20 1635 01/23/20 1958  GLUCAP 152* 126* 136* 144* 167* 134*    BMET Recent Labs  Lab 01/22/20 0437 01/22/20 1813 01/23/20 0520  NA 149* 150* 151*  K 2.9* 3.3* 2.9*  CL 120* 117* 114*  CO2 21* 22 23  BUN 101* 94* 93*  CREATININE 2.17* 2.03* 2.02*  GLUCOSE 157* 148* 148*    Liver Enzymes Recent Labs  Lab 01/21/20 0437 01/22/20 0437 01/23/20 0520  AST 61* 48* 36  ALT 107* 83* 60*  ALKPHOS 72 76 98  BILITOT 0.7 1.1 1.1  ALBUMIN 1.6* 1.5* 1.5*    Electrolytes Recent Labs  Lab 01/17/20 0439 01/18/20 0334 01/22/20 0437 01/22/20 1813 01/23/20 0520  CALCIUM 7.2*   < > 7.4* 7.6* 7.7*  MG 2.0  --   --   --  1.6*  PHOS 6.4*  --   --   --   --    < > = values in this interval not displayed.    CBC Recent Labs  Lab 01/21/20 0437 01/22/20 0437 01/23/20 0520  WBC 27.0* 24.9* 24.3*  HGB 11.4* 11.8* 11.8*  HCT 35.0* 35.2* 35.1*  PLT 176 287 411*    ABG Recent Labs  Lab  01/23/20 2245  PHART 7.291*  PCO2ART 49.4*  PO2ART 108    Coag's Recent Labs  Lab 01/17/20 0439  INR 1.5*    Sepsis Markers Recent Labs  Lab 01/20/20 1321 01/21/20 0437  PROCALCITON 1.41 0.97    Cardiac Enzymes No results for input(s): TROPONINI, PROBNP in the last 168 hours.  PAST MEDICAL HISTORY :   She  has a past medical history of Adhesive capsulitis of left shoulder (06/06/2016), Alcohol-induced chronic pancreatitis ([place]), Brittle bone disease, Chronic pain syndrome, COPD (chronic obstructive pulmonary disease) ([place]), Hearing loss, right (07/08/2016), Hepatitis C, High cholesterol, Hypertension, Hypopotassemia, Hypothyroidism (07/08/2016), Prediabetes, SSS (sick sinus syndrome) ([place]) (09/06/2015), Thyroid disease, and Vitamin D deficiency.  PAST SURGICAL HISTORY:  She  has a past surgical history that includes PACEMAKER IMPLANT; Tonsillectomy; Abdominal hysterectomy; Appendectomy; ERCP; IR Angiogram Visceral Selective (01/15/2020); IR EMBO ART  VEN HEMORR LYMPH EXTRAV  INC GUIDE ROADMAPPING (01/15/2020); IR Angiogram Selective Each Additional Vessel (01/15/2020); IR US Guide Vasc Access Right (01/15/2020); Esophagogastroduodenoscopy (egd) with propofol (N/A, 01/14/2020); Esophagogastroduodenoscopy (N/A, 01/17/2020); and Foreign Body Removal (01/17/2020).  Allergies  Allergen Reactions  . Buprenorphine Nausea Only  . Aspirin Nausea Only    GI upset GI upset     No current facility-administered medications on file prior to encounter.   Current Outpatient Medications on File Prior to Encounter  Medication Sig  . acetaminophen (TYLENOL) 500 MG tablet Take 2 tablets by mouth every 8 (eight) hours as needed.  Marland Kitchen albuterol (VENTOLIN HFA) 108 (90 Base) MCG/ACT inhaler Inhale 2 puffs into the lungs every 4 (four) hours as needed.  Marland Kitchen amLODipine (NORVASC) 5 MG tablet Take 1 tablet by mouth daily.  . baclofen (LIORESAL) 10 MG tablet Take 1 tablet by mouth daily as needed.  .  ergocalciferol (VITAMIN D2) 1.25 MG (50000 UT) capsule Take 1 capsule by mouth once a week.  . fenofibrate (TRICOR) 48 MG tablet Take 1 tablet by mouth daily.  Marland Kitchen gabapentin (NEURONTIN) 300 MG capsule Take 1 capsule by mouth 3 (three) times daily.  Marland Kitchen levothyroxine (SYNTHROID) 50 MCG tablet Take 50 mcg by mouth daily before breakfast.  . Melatonin 5 MG CAPS Take 2 capsules by mouth at bedtime.  . Oxycodone HCl 10 MG TABS Take 10 mg by mouth every 6 (six) hours as needed for pain.  . Pancrelipase, Lip-Prot-Amyl, (CREON) 24000-76000 units CPEP Take 1 capsule by mouth 3 (three) times daily.  . pantoprazole (PROTONIX) 40 MG tablet Take 40 mg by mouth daily.  . polyethylene glycol powder (GLYCOLAX/MIRALAX) 17 GM/SCOOP powder Take 17 g by mouth as needed.  . senna (SENOKOT) 8.6 MG TABS tablet Take 1 tablet by mouth as needed for mild constipation.  . Tiotropium Bromide Monohydrate (SPIRIVA RESPIMAT) 1.25 MCG/ACT AERS Inhale 2 puffs into the lungs daily.    FAMILY HISTORY:   Her family history includes Emphysema in her mother; Heart attack in her brother; Heart block in her father; Lung cancer in her mother;  Thyroid disease in her daughter, granddaughter, and mother.  SOCIAL HISTORY:  She  reports that she has been smoking cigarettes. She has never used smokeless tobacco. She reports previous alcohol use. She reports previous drug use.  REVIEW OF SYSTEMS:    Negative except for as noted in subjective.

## 2020-01-23 NOTE — NC FL2 (Signed)
[place] MEDICAID FL2 LEVEL OF CARE SCREENING TOOL     IDENTIFICATION  Patient Name: Natasha Jacobson Birthdate: 1950/11/30 Sex: female Admission Date (Current Location): 01/13/2020  [facility] and [place] Number:  Producer, television/film/video and Address:  [facility], [address], [place], [place] [address]      Provider Number: [id number]  Attending Physician Name and Address:  Dimple Nanas, MD  Relative Name and Phone Number:  Raygen Linquist [phone]    Current Level of Care: Hospital Recommended Level of Care: Skilled Nursing Facility Prior Approval Number:    Date Approved/Denied: 01/23/20 PASRR Number: [id number]  Discharge Plan: SNF    Current Diagnoses: Patient Active Problem List   Diagnosis Date Noted  . Acute encephalopathy   . Hemorrhagic shock (HCC)   . AKI (acute kidney injury) (HCC)   . Anemia   . Essential hypertension 01/14/2020  . Acute pancreatitis 01/14/2020  . Gastrointestinal hemorrhage   . Acute respiratory failure (HCC)   . Pancreatitis, acute 01/13/2020  . Generalized anxiety disorder 12/20/2019  . 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 Chronic pain syndrome 08/02/2018  . Chronic hepatitis C without hepatic coma (HCC) 08/18/2017  . Hypothyroidism 07/08/2016  . Hearing loss, right 07/08/2016  . COPD (chronic obstructive pulmonary disease) (HCC) 09/06/2015  . SSS (sick sinus syndrome) (HCC) 09/06/2015    Orientation RESPIRATION BLADDER Height & Weight     Self  Other (Comment)(Tachypnea) Incontinent, External catheter Weight: 127 lb 3.3 oz (57.7 kg) Height:  5\' 4"  (162.6 cm)  BEHAVIORAL SYMPTOMS/MOOD NEUROLOGICAL BOWEL NUTRITION STATUS      Incontinent Feeding tube  AMBULATORY STATUS COMMUNICATION OF NEEDS Skin   Extensive Assist Verbally Bruising, Other (Comment)(Appropriate for ethnicity generalizaed dryness bilateral barrier cream skin turger  non-tenting; eccymosis arm bilateral groin buttocks: n)                       Personal Care Assistance Level of Assistance  Bathing, Feeding, Dressing Bathing Assistance: Maximum assistance Feeding assistance: Maximum assistance(Tube Feeding) Dressing Assistance: Maximum assistance     Functional Limitations Info  Speech, Sight, Hearing Sight Info: Adequate Hearing Info: Adequate Speech Info: Adequate(Hoarse)    SPECIAL CARE FACTORS FREQUENCY                       Contractures Contractures Info: Not present    Additional Factors Info  Code Status, Allergies(Full) Code Status Info: Full Code Allergies Info: Aspirin,Buprenorphine- Nausea with both           Current Medications (01/23/2020):  This is the current hospital active medication list Current Facility-Administered Medications  Medication Dose Route Frequency Provider Last Rate Last Admin  . 0.9 %  sodium chloride infusion (Manually program via Guardrails IV Fluids)   Intravenous Once Armbruster, 01/25/2020, MD   Stopped at 01/14/20 1035  . 0.9 %  sodium chloride infusion (Manually program via Guardrails IV Fluids)   Intravenous Once 03/15/20, MD      . 0.9 %  sodium chloride infusion (Manually program via Guardrails IV Fluids)   Intravenous Once Burna Cash, MD      . 0.9 %  sodium chloride infusion   Intravenous PRN Lorin Glass, MD 999 mL/hr at 01/14/20 1058 New Bag at 01/14/20 1058  . 0.9 %  sodium chloride infusion  250 mL Intravenous Continuous 03/15/20, MD   Stopped at  01/20/20 2116  . albuterol (PROVENTIL) (2.5 MG/3ML) 0.083% nebulizer solution 2.5 mg  2.5 mg Inhalation Q4H PRN Eduard Clos, MD   2.5 mg at 01/16/20 8676  . chlorhexidine (PERIDEX) 0.12 % solution 15 mL  15 mL Mouth Rinse BID Oretha Milch, MD   15 mL at 01/23/20 1030  . Chlorhexidine Gluconate Cloth 2 % PADS 6 each  6 each Topical Daily Jackie Plum, MD   6 each at 01/23/20 0405  . docusate  (COLACE) 50 MG/5ML liquid 100 mg  100 mg Per Tube BID PRN Oretha Milch, MD      . feeding supplement (PRO-STAT SUGAR FREE 64) liquid 30 mL  30 mL Per Tube TID Lorin Glass, MD   30 mL at 01/23/20 1029  . feeding supplement (VITAL AF 1.2 CAL) liquid 1,000 mL  1,000 mL Per Tube Continuous Amin, Ankit Chirag, MD 40 mL/hr at 01/23/20 1200 Rate Change at 01/23/20 1200  . free water 200 mL  200 mL Per Tube Q4H Beola Cord, MD   200 mL at 01/23/20 1151  . furosemide (LASIX) injection 40 mg  40 mg Intravenous Q8H Amin, Ankit Chirag, MD   40 mg at 01/23/20 1516  . hydrALAZINE (APRESOLINE) injection 10 mg  10 mg Intravenous Q4H PRN Eduard Clos, MD   10 mg at 01/20/20 2216  . insulin aspart (novoLOG) injection 0-6 Units  0-6 Units Subcutaneous Q4H Lorin Glass, MD   1 Units at 01/15/20 0434  . ipratropium-albuterol (DUONEB) 0.5-2.5 (3) MG/3ML nebulizer solution 3 mL  3 mL Nebulization TID PRN Amin, Ankit Chirag, MD      . lactulose (CHRONULAC) 10 GM/15ML solution 20 g  20 g Oral TID Amin, Ankit Chirag, MD   20 g at 01/23/20 1029  . levothyroxine (SYNTHROID) tablet 50 mcg  50 mcg Per Tube H2094 Oretha Milch, MD   50 mcg at 01/23/20 0504  . LORazepam (ATIVAN) injection 1-2 mg  1-2 mg Intravenous Q4H PRN Oretha Milch, MD   1 mg at 01/22/20 1631  . MEDLINE mouth rinse  15 mL Mouth Rinse q12n4p Oretha Milch, MD   15 mL at 01/23/20 1251  . ondansetron (ZOFRAN) tablet 4 mg  4 mg Oral Q6H PRN Eduard Clos, MD       Or  . ondansetron [facility]) injection 4 mg  4 mg Intravenous Q6H PRN Eduard Clos, MD      . oxyCODONE (ROXICODONE) 5 MG/5ML solution 5 mg  5 mg Per Tube Q12H Oretha Milch, MD   5 mg at 01/23/20 0504  . pantoprazole (PROTONIX) injection 40 mg  40 mg Intravenous Q12H Lorin Glass, MD   40 mg at 01/23/20 1029  . polyethylene glycol (MIRALAX / GLYCOLAX) packet 17 g  17 g Per Tube Daily PRN Oretha Milch, MD      . potassium chloride 20 MEQ/15ML (10%) solution  40 mEq  40 mEq Oral Once Amin, Ankit Chirag, MD      . sodium chloride flush (NS) 0.9 % injection 10-40 mL  10-40 mL Intracatheter Q12H Lorin Glass, MD   40 mL at 01/23/20 1048  . sodium chloride flush (NS) 0.9 % injection 10-40 mL  10-40 mL Intracatheter PRN Lorin Glass, MD   10 mL at 01/23/20 0505     Discharge Medications: Please see discharge summary for a list of discharge medications.  Relevant Imaging Results:  Relevant Lab Results:  Additional Information [id number]  Trula Ore, LCSWA

## 2020-01-23 NOTE — Progress Notes (Signed)
Nutrition Follow-up  DOCUMENTATION CODES:   Not applicable  INTERVENTION:  Change tube feeding to Vital 1.5 @ 40ml/hr (960ml)  30ml Pro-stat daily  Free water per MD  Tube feeding regimen will provide 1540 kcal, 80 grams protein, 733ml free water   NUTRITION DIAGNOSIS:   Inadequate oral intake related to inability to eat as evidenced by NPO status.  Ongoing.  GOAL:   Patient will meet greater than or equal to 90% of their needs  Met with TF.   MONITOR:   Vent status, TF tolerance, Labs, Weight trends  REASON FOR ASSESSMENT:   Ventilator, Consult Enteral/tube feeding initiation and management  ASSESSMENT:   69 y.o. female with history of chronic alcoholic pancreatitis s/p exchange of pancreatico-gastrostomy stent at [facility] on 01/12/20. She started developing abdominal pain later in the evening which continued to worsen and 4/9 she began having hematemesis. She also had dark stools following which patient decided to come to the ED. Pain has been continuous epigastric in nature radiating to the back. CT abdomen/pelvis in the ED showed concern for acute on chronic pancreatitis and UA concerning for UTI. No bed available at [facility] to transfer patient so ED MD talked with GI and patient was admitted at [place].  4/12 reintubated for encephalopathy and hypoxia  4/13 s/p EGD 4/16 pt extubated, Cortrak placed (post-pyloric), off pressors, renal function improved  Discussed pt with RN.   RD visited pt today; pt provided no responses to RD questions.  Current tube feeding regimen: 30ml Pro-stat TID, 200ml free water Q4H, Vital AF 1.2 cal @ 40ml/hr.  Per MD, pt likely fluid overloaded. RD will transition pt to Vital 1.5 at the same rate, which will provide less free water than the current tube feeding regimen.    UOP: 1,775ml x24 hours I/O: +17,811.6ml since admit   Labs reviewed: Na 151 (H), K+ 2.9 (L), Mg 1.6 (L) CBGs [id number]  Medications reviewed  and include: Lasix, Novolog, Chronulac   Diet Order:   Diet Order            Diet NPO time specified  Diet effective now              EDUCATION NEEDS:   Not appropriate for education at this time  Skin:  Skin Assessment: Skin Integrity Issues: Skin Integrity Issues:: Other (Comment) Other: MASD groin/buttocks  Last BM:  4/19 type 6  Height:   Ht Readings from Last 1 Encounters:  01/14/20 5' 4" (1.626 m)    Weight:   Wt Readings from Last 1 Encounters:  01/23/20 57.7 kg    Ideal Body Weight:  54.5 kg  BMI:  Body mass index is 21.83 kg/m.  Estimated Nutritional Needs:   Kcal:  1450-1650  Protein:  70-85 grams  Fluid:  >1.45 L/d    , MS, RD, LDN RD pager number and weekend/on-call pager number located in Amion.  

## 2020-01-23 NOTE — Progress Notes (Signed)
Attempted to call patients daughter, Jamesetta [place], at [phone] reference to change in patient's condition and verify goals of care.   Unable to reach x 2.  Went to [employer], message not left   Posey Boyer, MSN, AGACNP-BC [place] [facility] 01/23/2020, 11:45 PM

## 2020-01-23 NOTE — Progress Notes (Signed)
Physical Therapy Treatment Patient Details Name: Natasha Jacobson MRN: [id number] DOB: 1950-12-07 Today's Date: 01/23/2020    History of Present Illness Pt is a 69 yo female admitted with hemorrhagic shock 2/2 bleeding 91mm pseudoaneurysm and recent instrumentation s/p EGD and coil embolization by IR 4/10. Course also significant for UTI, acute on chronic pancreatitis, and ETT 4/10-4/11; reintubated for encephalopathy and hypoxia 4/12-4/16. PMH also includes: COPD, hearing loss, HTN, thyroid disease    PT Comments    Patient is awake in bed, unable to verbalize, appears critically ill. RR in mid 30s to upper 40s throughout session. Patient shakes her head "no"  to all mention of mobilization. She tolerated PROM to B LEs with mild resistance. She will continue to benefit from skilled PT while here to improve strength and functional independence.      Follow Up Recommendations  SNF     Equipment Recommendations  Other (comment)(TBD)    Recommendations for Other Services       Precautions / Restrictions Precautions Precautions: Fall Precaution Comments: cortrak, watch RR rate Restrictions Weight Bearing Restrictions: No    Mobility  Bed Mobility Overal bed mobility: Needs Assistance Bed Mobility: Rolling Rolling: Total assist         General bed mobility comments: patient shakes head no to all requests to move or assist with movement  Transfers                 General transfer comment: unable/pt unwilling to attempt. Resisting mobility attempts.  Ambulation/Gait             General Gait Details: unable   Stairs             Wheelchair Mobility    Modified Rankin (Stroke Patients Only)       Balance                                            Cognition Arousal/Alertness: Lethargic Behavior During Therapy: Flat affect Overall Cognitive Status: Difficult to assess Area of Impairment: Awareness                            Awareness: Emergent   General Comments: Very little verbalizations. Following commands to squeeze hand and wiggle feet. Resisting all mobility attempts. Alert with eyes open.      Exercises Total Joint Exercises Ankle Circles/Pumps: PROM;Both;10 reps Heel Slides: PROM;Both;10 reps Hip ABduction/ADduction: PROM;Both;10 reps    General Comments        Pertinent Vitals/Pain Pain Assessment: Faces Faces Pain Scale: Hurts even more Pain Location: Chest Pain Descriptors / Indicators: Guarding;Grimacing;Discomfort Pain Intervention(s): Monitored during session;Repositioned    Home Living                      Prior Function            PT Goals (current goals can now be found in the care plan section) Acute Rehab PT Goals Patient Stated Goal: home per daughter PT Goal Formulation: With family Time For Goal Achievement: 02/04/20 Potential to Achieve Goals: Fair Progress towards PT goals: Not progressing toward goals - comment(unwilling/unable to participate)    Frequency    Min 3X/week      PT Plan Discharge plan needs to be updated    Co-evaluation  AM-PAC PT "6 Clicks" Mobility   Outcome Measure  Help needed turning from your back to your side while in a flat bed without using bedrails?: Total Help needed moving from lying on your back to sitting on the side of a flat bed without using bedrails?: Total Help needed moving to and from a bed to a chair (including a wheelchair)?: Total Help needed standing up from a chair using your arms (e.g., wheelchair or bedside chair)?: Total Help needed to walk in hospital room?: Total Help needed climbing 3-5 steps with a railing? : Total 6 Click Score: 6    End of Session   Activity Tolerance: Patient limited by fatigue;Other (comment)(limited by RR in 30-40 range resting in bed) Patient left: in bed;with call bell/phone within reach Nurse Communication: Mobility status PT Visit Diagnosis:  Other abnormalities of gait and mobility (R26.89);Muscle weakness (generalized) (M62.81)     Time: 3903-0092 PT Time Calculation (min) (ACUTE ONLY): 15 min  Charges:  $Therapeutic Exercise: 8-22 mins                     Ceira Hoeschen, PT, GCS 01/23/20,10:22 AM

## 2020-01-23 NOTE — Care Management Important Message (Signed)
Important Message  Patient Details  Name: Natasha Jacobson MRN: [id number] Date of Birth: 17-Feb-1951   Medicare Important Message Given:  Yes     Renie Ora 01/23/2020, 10:55 AM

## 2020-01-23 NOTE — Progress Notes (Signed)
VASCULAR LAB PRELIMINARY  PRELIMINARY  PRELIMINARY  PRELIMINARY  Right upper extremity venous duplex completed.    Preliminary report:  See CV proc for preliminary results.  Samiel Peel, RVT 01/23/2020, 2:16 PM

## 2020-01-23 NOTE — NC FL2 (Addendum)
[place] MEDICAID FL2 LEVEL OF CARE SCREENING TOOL     IDENTIFICATION  Patient Name: Natasha Jacobson Birthdate: 07-24-1951 Sex: female Admission Date (Current Location): 01/13/2020  [facility] and [place] Number:  Producer, television/film/video and Address:  [facility], [address] [place], [place] [address]      Provider Number: [id number]  Attending Physician Name and Address:  Dimple Nanas, MD  Relative Name and Phone Number:  Yanilen Adamik [phone]    Current Level of Care: Hospital Recommended Level of Care: Skilled Nursing Facility Prior Approval Number:    Date Approved/Denied:   PASRR Number:    Discharge Plan: SNF    Current Diagnoses: Patient Active Problem List   Diagnosis Date Noted  . Acute encephalopathy   . Hemorrhagic shock (HCC)   . AKI (acute kidney injury) (HCC)   . Anemia   . Essential hypertension 01/14/2020  . Acute pancreatitis 01/14/2020  . Gastrointestinal hemorrhage   . Acute respiratory failure (HCC)   . Pancreatitis, acute 01/13/2020  . Generalized anxiety disorder 12/20/2019  . 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 Chronic pain syndrome 08/02/2018  . Chronic hepatitis C without hepatic coma (HCC) 08/18/2017  . Hypothyroidism 07/08/2016  . Hearing loss, right 07/08/2016  . COPD (chronic obstructive pulmonary disease) (HCC) 09/06/2015  . SSS (sick sinus syndrome) (HCC) 09/06/2015    Orientation RESPIRATION BLADDER Height & Weight     Self  Other (Comment)(Tachypnea) Incontinent, External catheter Weight: 57.7 kg Height:  5\' 4"  (162.6 cm)  BEHAVIORAL SYMPTOMS/MOOD NEUROLOGICAL BOWEL NUTRITION STATUS      Incontinent Feeding tube  AMBULATORY STATUS COMMUNICATION OF NEEDS Skin   Extensive Assist Verbally Bruising, Other (Comment)(Appropriate for ethnicity generalizaed dryness bilateral barrier cream skin turger non-tenting; eccymosis arm bilateral  groin buttocks: n)                       Personal Care Assistance Level of Assistance  Bathing, Feeding, Dressing Bathing Assistance: Maximum assistance Feeding assistance: Maximum assistance(Tube Feeding) Dressing Assistance: Maximum assistance     Functional Limitations Info  Speech, Sight, Hearing Sight Info: Adequate Hearing Info: Adequate Speech Info: Adequate(Hoarse)    SPECIAL CARE FACTORS FREQUENCY                       Contractures Contractures Info: Not present    Additional Factors Info  Code Status, Allergies(Full) Code Status Info: Full Code Allergies Info: Aspirin,Buprenorphine- Nausea with both           Current Medications (01/23/2020):  This is the current hospital active medication list Current Facility-Administered Medications  Medication Dose Route Frequency Provider Last Rate Last Admin  . 0.9 %  sodium chloride infusion (Manually program via Guardrails IV Fluids)   Intravenous Once Armbruster, 01/25/2020, MD   Stopped at 01/14/20 1035  . 0.9 %  sodium chloride infusion (Manually program via Guardrails IV Fluids)   Intravenous Once 03/15/20, MD      . 0.9 %  sodium chloride infusion (Manually program via Guardrails IV Fluids)   Intravenous Once Burna Cash, MD      . 0.9 %  sodium chloride infusion   Intravenous PRN Lorin Glass, MD 999 mL/hr at 01/14/20 1058 New Bag at 01/14/20 1058  . 0.9 %  sodium chloride infusion  250 mL Intravenous Continuous 03/15/20, MD   Stopped at 01/20/20 2116  .  albuterol (PROVENTIL) (2.5 MG/3ML) 0.083% nebulizer solution 2.5 mg  2.5 mg Inhalation Q4H PRN Eduard Clos, MD   2.5 mg at 01/16/20 4196  . chlorhexidine (PERIDEX) 0.12 % solution 15 mL  15 mL Mouth Rinse BID Oretha Milch, MD   15 mL at 01/23/20 1030  . Chlorhexidine Gluconate Cloth 2 % PADS 6 each  6 each Topical Daily Jackie Plum, MD   6 each at 01/23/20 0405  . docusate (COLACE) 50 MG/5ML liquid 100 mg  100 mg  Per Tube BID PRN Oretha Milch, MD      . feeding supplement (PRO-STAT SUGAR FREE 64) liquid 30 mL  30 mL Per Tube TID Lorin Glass, MD   30 mL at 01/23/20 1029  . feeding supplement (VITAL AF 1.2 CAL) liquid 1,000 mL  1,000 mL Per Tube Continuous Amin, Ankit Chirag, MD 40 mL/hr at 01/23/20 1200 Rate Change at 01/23/20 1200  . free water 200 mL  200 mL Per Tube Q4H Beola Cord, MD   200 mL at 01/23/20 1151  . furosemide (LASIX) injection 40 mg  40 mg Intravenous Q8H Amin, Ankit Chirag, MD   40 mg at 01/23/20 1029  . hydrALAZINE (APRESOLINE) injection 10 mg  10 mg Intravenous Q4H PRN Eduard Clos, MD   10 mg at 01/20/20 2216  . insulin aspart (novoLOG) injection 0-6 Units  0-6 Units Subcutaneous Q4H Lorin Glass, MD   1 Units at 01/15/20 0434  . ipratropium-albuterol (DUONEB) 0.5-2.5 (3) MG/3ML nebulizer solution 3 mL  3 mL Nebulization TID PRN Amin, Ankit Chirag, MD      . lactulose (CHRONULAC) 10 GM/15ML solution 20 g  20 g Oral TID Amin, Ankit Chirag, MD   20 g at 01/23/20 1029  . levothyroxine (SYNTHROID) tablet 50 mcg  50 mcg Per Tube Q2297 Oretha Milch, MD   50 mcg at 01/23/20 0504  . LORazepam (ATIVAN) injection 1-2 mg  1-2 mg Intravenous Q4H PRN Oretha Milch, MD   1 mg at 01/22/20 1631  . MEDLINE mouth rinse  15 mL Mouth Rinse q12n4p Oretha Milch, MD   15 mL at 01/23/20 1251  . ondansetron (ZOFRAN) tablet 4 mg  4 mg Oral Q6H PRN Eduard Clos, MD       Or  . ondansetron [facility]) injection 4 mg  4 mg Intravenous Q6H PRN Eduard Clos, MD      . oxyCODONE (ROXICODONE) 5 MG/5ML solution 5 mg  5 mg Per Tube Q12H Oretha Milch, MD   5 mg at 01/23/20 0504  . pantoprazole (PROTONIX) injection 40 mg  40 mg Intravenous Q12H Lorin Glass, MD   40 mg at 01/23/20 1029  . polyethylene glycol (MIRALAX / GLYCOLAX) packet 17 g  17 g Per Tube Daily PRN Oretha Milch, MD      . potassium chloride 20 MEQ/15ML (10%) solution 40 mEq  40 mEq Oral Once Amin, Ankit  Chirag, MD      . sodium chloride flush (NS) 0.9 % injection 10-40 mL  10-40 mL Intracatheter Q12H Lorin Glass, MD   40 mL at 01/23/20 1048  . sodium chloride flush (NS) 0.9 % injection 10-40 mL  10-40 mL Intracatheter PRN Lorin Glass, MD   10 mL at 01/23/20 0505     Discharge Medications: Please see discharge summary for a list of discharge medications.  Relevant Imaging Results:  Relevant Lab Results:   Additional Information  Verdell Carmine, RN

## 2020-01-23 NOTE — TOC Initial Note (Signed)
Transition of Care [facility]) - Initial/Assessment Note    Patient Details  Name: Natasha Jacobson MRN: [id number] Date of Birth: [birth year]  [facility]) CM/SW Contact:    Verdell Carmine, RN Phone Number: 01/23/2020, 2:41 PM  Clinical Narrative:                 Patient admitted with pancreatitis, respiratory distress, Hematemesis with GI bleed, hemorrhage, H&H extremely low received mass transfusion.  Acute encephalopathy, continuing.   Recommendation for SNF based on deconditioning.  Daughter lieves in [place], but  Works from home and if often down in [place].   Expected Discharge Plan: Skilled Nursing Facility Barriers to Discharge: Continued Medical Work up   Patient Goals and CMS Choice Patient states their goals for this hospitalization and ongoing recovery are:: Daughter To get stronger   Choice offered to / list presented to : Adult Children(States wherever we can go between [place] and [place])  Expected Discharge Plan and Services Expected Discharge Plan: [place]   Discharge Planning Services: Other - See comment(SNF)   Living arrangements for the past 2 months: Single Family Home                                      Prior Living Arrangements/Services Living arrangements for the past 2 months: Single Family Home Lives with:: Self Patient language and need for interpreter reviewed:: Yes        Need for Family Participation in Patient Care: Yes (Comment) Care giver support system in place?: Yes (comment)   Criminal Activity/Legal Involvement Pertinent to Current Situation/Hospitalization: No - Comment as needed  Activities of Daily Living Home Assistive Devices/Equipment: None ADL Screening (condition at time of admission) Patient's cognitive ability adequate to safely complete daily activities?: Yes Is the patient deaf or have difficulty hearing?: No Does the patient have difficulty seeing, even when wearing  glasses/contacts?: No Does the patient have difficulty concentrating, remembering, or making decisions?: No Patient able to express need for assistance with ADLs?: Yes Does the patient have difficulty dressing or bathing?: No Independently performs ADLs?: Yes (appropriate for developmental age) Does the patient have difficulty walking or climbing stairs?: No Weakness of Legs: None Weakness of Arms/Hands: None  Permission Sought/Granted Permission sought to share information with : Case Manager    Share Information with NAME: Nayab Aten [phone]  Permission granted to share info w AGENCY: care management, Social Work        Emotional Assessment Appearance:: Appears older than stated age Attitude/Demeanor/Rapport: Unresponsive Affect (typically observed): Withdrawn Orientation: : (eyes fixed on speaker, but unable to respond)   Psych Involvement: No (comment)  Admission diagnosis:  Pancreatitis, acute [K85.90] AKI (acute kidney injury) ([place]) [N17.9] Hematemesis with nausea [K92.0] Anemia, unspecified type [D64.9] Acute pancreatitis without infection or necrosis, unspecified pancreatitis type [K85.90] Acute pancreatitis [K85.90] Patient Active Problem List   Diagnosis Date Noted  . Acute encephalopathy   . Hemorrhagic shock ([place])   . AKI (acute kidney injury) ([place])   . Anemia   . Essential hypertension 01/14/2020  . Acute pancreatitis 01/14/2020  . Gastrointestinal hemorrhage   . Acute respiratory failure ([place])   . Pancreatitis, acute 01/13/2020  . Generalized anxiety disorder 12/20/2019  . Alcohol-induced chronic pancreatitis ([place]) 11/17/2019  . 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 Chronic pain syndrome 08/02/2018  . Chronic hepatitis C without hepatic coma ([place]) 08/18/2017  .  Hypothyroidism 07/08/2016  . Hearing loss, right 07/08/2016  . COPD (chronic obstructive pulmonary disease) (HCC) 09/06/2015  . SSS (sick sinus syndrome) (HCC) 09/06/2015   PCP:   Ardyth Gal, MD Pharmacy:   [facility] [place], [place] - [address] [place] [address] Phone: [phone] Fax: [phone]     Social Determinants of Health (SDOH) Interventions    Readmission Risk Interventions No flowsheet data found.

## 2020-01-23 NOTE — Progress Notes (Signed)
SLP Cancellation Note  Patient Details Name: Natasha Jacobson MRN: [id number] DOB: December 27, 1950   Cancelled treatment:       Reason Eval/Treat Not Completed: Medical issues which prohibited therapy. Given pts struggle with RR today, will defer any PO trials and f/u tomorrow.   Harlon Ditty, MA CCC-SLP  Acute Rehabilitation Services Pager [phone] Office [phone]  [name] 01/23/2020, 11:20 AM

## 2020-01-23 NOTE — Progress Notes (Addendum)
Addendum: Adding Vancomycin for empiric sepsis coverage.   Plan:  Vancomycin 1g IV x1 now, then  1g IV every 48 hours.  Goal AUC 400-550. Expected AUC: 500 SCr used: 2.02 Monitor SCr (improving) - may need to dose based on levels.  Follow-up culture results.   Link Snuffer, PharmD, BCPS, BCCCP Clinical Pharmacist Please refer to [facility] for [facility] Pharmacy numbers 01/24/2020, 12:31 AM    Pharmacy Antibiotic Note  Natasha Jacobson is a 69 y.o. female admitted on 01/13/2020 with pneumonia.  Pharmacy has been consulted for Cefepime dosing.  Patient has received Ceftriaxone and Ampicillin for Enterococcus UTI. Now with worsening leukocytosis, tracheal aspirate growing Enterobacter cloacae (resistant to ancef, ceftazidime, and zosyn but sensitive to cefepime). SCr is slowly trending down.   Plan: Cefepime 2g IV every 24 hours.  Monitor renal function, culture results, and clinical status.   Height: 5\' 4"  (162.6 cm) Weight: 57.7 kg (127 lb 3.3 oz) IBW/kg (Calculated) : 54.7  Temp (24hrs), Avg:98.6 F (37 C), Min:98.1 F (36.7 C), Max:99.1 F (37.3 C)  Recent Labs  Lab 01/19/20 0700 01/19/20 1336 01/20/20 0600 01/20/20 0600 01/21/20 0437 01/21/20 1526 01/22/20 0437 01/22/20 1813 01/23/20 0520  WBC  --  18.6* 21.7*  --  27.0*  --  24.9*  --  24.3*  CREATININE   < >  --  3.03*   < > 2.67* 2.46* 2.17* 2.03* 2.02*   < > = values in this interval not displayed.    Estimated Creatinine Clearance: 23.7 mL/min (A) (by C-G formula based on SCr of 2.02 mg/dL (H)).    Allergies  Allergen Reactions  . Buprenorphine Nausea Only  . Aspirin Nausea Only    GI upset GI upset     Antimicrobials this admission: Zosyn x1 CTX 4/10>>4/12 Amp 4/12>>4/17 Cefepime 4/19 >>  Dose adjustments this admission:  Microbiology results: 4/9 Ucx: 100K enterococcus (p-sensitive) 4/9 Bcx: NGTD 4/10 MRSA PCR: neg 4/10 Ucx: 100K enterococcus (p-sensitive) 4/16 TA: Enterobacter cloacae (S-cefepime,  cipro, imipenem, bactrim)  Thank you for allowing pharmacy to be a part of this patient's care.  5/16, PharmD, BCPS, BCCCP Clinical Pharmacist Please refer to [facility] for [facility] Pharmacy numbers 01/23/2020 11:02 PM

## 2020-01-23 NOTE — Progress Notes (Signed)
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  Received mass transfusion, admitted to the ICU.  EGD showed active upper GI bleeding with difficulty locating active bleed, CT abdomen showed 7 mm pseudoaneurysm over pancreatic branch probably the culprit for hemorrhage.  IR performed selective splenic arteriogram and coil embolization of pseudoaneurysm.  Initially extubated 4/11 and then reintubated due to increased work of breathing.  CT head negative, had progressively worsening encephalopathy with electrolyte abnormality   Assessment & Plan:   Principal Problem:   Pancreatitis, acute Active Problems:   COPD (chronic obstructive pulmonary disease) (HCC)   Hypothyroidism   Essential hypertension   Acute pancreatitis   Gastrointestinal hemorrhage   Acute respiratory failure (HCC)   AKI (acute kidney injury) (HCC)   Anemia   Hemorrhagic shock (HCC)   Acute encephalopathy   Acute hypoxic respiratory failure Tachypnea -Suspect some fluid overload.  Chest x-ray reviewed -Continue Lasix 40 mg IV every 8 hours next 3 doses -Aggressively replete electrolytes -Supplemental oxygen.  I-S/bronchodilators -Tracheal aspirate showing Enterobacter cloacae, no leukocytosis or cough.  Slowly improving therefore hold off on starting antibiotics but if her tachypnea continues to worsen, low threshold to start antibiotics.  Sensitivities reviewed, will start with cefepime if necessary  Acute metabolic encephalopathy -Suspect secondary to ongoing illness.  No focal neuro deficits appreciated at this time.  Suspect underlying uremia -Slightly elevated ammonia= lactulose 3 times daily -Ammonia level is  elevated-lactulose 3 times daily started -TSH elevated, free T4 normal  Acute kidney injury -Baseline creatinine 1.0.  Admission creatinine 1.6, peaked at 3.2.  Slowly downtrending -BUN still elevated but improving. -Monitor urine output.  Hemorrhagic shock secondary to bleeding splenic pseudoaneurysm -Status post embolization.  Received massive PRBC transfusion. -Seen by GI and IR -Status post 10 units PRBC transfusion, 1 unit platelet -Protonix 40 mg IV every 12  Right upper extremity pain and swelling -Elevate arm.  Venous duplex ordered-rule out DVT -Pain control  Hypernatremia Hypokalemia -Due to fluid overload, holding off on fluids.  Currently she is getting IV Lasix.  Enterococcus faecalis urinary tract infection -Received IV ampicillin, completed course.  Hypothyroidism -Synthroid 50 mcg  PT recommending SNF Speech and swallow-pending   DVT prophylaxis: SCDs Code Status: Full code Family Communication: None Disposition Plan:   Patient From= home  Patient Anticipated D/C place= to be determined, PT recommended CIR  Barriers= maintain patient in the hospital given multiple ongoing issues including tachypnea, encephalopathy, acute kidney injury, multiple electrolyte normalities.  Unsafe for discharge.   Procedures:   CT abdomen pelvis that showed acute on chronic pancreatitis.  Bilateral renal parenchymal wedge shaped infarct  CTA abdomen pelvis-7 mm pseudoaneurysm concern for active bleeding.  EGD-clotted blood in the stomach, superficial gastric ulcer, diffuse mucosal congestion difficult to identify active source of bleeding  Antimicrobials:   Zosyn X1 dose-4/9  Rocephin 4/10-4/12  Ampicillin 4/12-4/17   Subjective: Drowsy but easily arousable.  More alert than yesterday, no complaints hungry visibly looks slightly tachypneic she is denying any shortness of breath  Review of Systems Otherwise negative except as per HPI, including: General:  Denies fever, chills, night sweats or unintended weight loss. Resp: Denies cough, wheezing, shortness of breath. Cardiac: Denies chest pain, palpitations, orthopnea, paroxysmal nocturnal dyspnea.  GI: Denies abdominal pain, nausea, vomiting, diarrhea or constipation GU: Denies dysuria, frequency, hesitancy or incontinence MS: Denies muscle aches, joint pain or swelling Neuro: Denies headache, neurologic deficits (focal weakness, numbness, tingling), abnormal gait Psych: Denies anxiety, depression, SI/HI/AVH Skin: Denies new rashes or lesions ID: Denies sick contacts, exotic exposures, travel  Examination:  Constitutional: Not in acute distress, core track in place Respiratory: Minimal bibasilar crackles, tachypnea Cardiovascular: Normal sinus rhythm, no rubs Abdomen: Nontender nondistended good bowel sounds Musculoskeletal: No edema noted Skin: No rashes seen Neurologic: CN 2-12 grossly intact.  And nonfocal Psychiatric: N poor judgment and insight. Alert and oriented x 3. Normal mood.  Objective: Vitals:   01/23/20 0027 01/23/20 0405 01/23/20 0747 01/23/20 1125  BP:   (!) 152/83 (!) 143/79  Pulse: 81  91 97  Resp: (!) 33  (!) 39 (!) 31  Temp:  99.1 F (37.3 C) 98.5 F (36.9 C) 98.8 F (37.1 C)  TempSrc:  Axillary Oral Axillary  SpO2: 98%  96% 98%  Weight:  57.7 kg    Height:        Intake/Output Summary (Last 24 hours) at 01/23/2020 1322 Last data filed at 01/23/2020 1151 Gross per 24 hour  Intake 40 ml  Output 2800 ml  Net -2760 ml   Filed Weights   01/21/20 0500 01/22/20 0437 01/23/20 0405  Weight: 56.8 kg 56.6 kg 57.7 kg     Data Reviewed:   CBC: Recent Labs  Lab 01/18/20 0334 01/18/20 0930 01/19/20 1336 01/19/20 2045 01/20/20 0600 01/20/20 0600 01/20/20 1321 01/20/20 2110 01/21/20 0437 01/22/20 0437 01/23/20 0520  WBC 16.5*  --  18.6*  --  21.7*  --   --   --  27.0* 24.9* 24.3*  NEUTROABS 13.7*  --   --   --   --   --   --   --   --   --   --    HGB 11.2*   < > 11.7*   < > 11.3*   < > 11.3* 11.6* 11.4* 11.8* 11.8*  HCT 33.3*   < > 36.0   < > 35.1*   < > 35.0* 36.3 35.0* 35.2* 35.1*  MCV 91.7  --  93.0  --  95.4  --   --   --  92.1 91.9 92.4  PLT 53*  --  80*  --  111*  --   --   --  176 287 411*   < > = values in this interval not displayed.   Basic Metabolic Panel: Recent Labs  Lab 01/17/20 0439 01/18/20 0334 01/21/20 0437 01/21/20 1526 01/22/20 0437 01/22/20 1813 01/23/20 0520  NA 148*   < > 149* 152* 149* 150* 151*  K 4.5   < > 2.6* 3.4* 2.9* 3.3* 2.9*  CL 109   < > 118* 120* 120* 117* 114*  CO2 25   < > 21* 20* 21* 22 23  GLUCOSE 88   < > 134* 143* 157* 148* 148*  BUN 59*   < > 116* 114* 101* 94* 93*  CREATININE 3.43*   < > 2.67* 2.46* 2.17* 2.03* 2.02*  CALCIUM 7.2*   < > 7.8* 7.7* 7.4* 7.6* 7.7*  MG 2.0  --   --   --   --   --  1.6*  PHOS 6.4*  --   --   --   --   --   --    < > = values in this  interval not displayed.   GFR: Estimated Creatinine Clearance: 23.7 mL/min (A) (by C-G formula based on SCr of 2.02 mg/dL (H)). Liver Function Tests: Recent Labs  Lab 01/19/20 0700 01/20/20 0600 01/21/20 0437 01/22/20 0437 01/23/20 0520  AST 100* 55* 61* 48* 36  ALT 201* 139* 107* 83* 60*  ALKPHOS 55 53 72 76 98  BILITOT 1.1 0.7 0.7 1.1 1.1  PROT 4.1* 3.9* 4.1* 4.3* 4.7*  ALBUMIN 1.6* 1.5* 1.6* 1.5* 1.5*   No results for input(s): LIPASE, AMYLASE in the last 168 hours. Recent Labs  Lab 01/22/20 1028  AMMONIA 44*   Coagulation Profile: Recent Labs  Lab 01/17/20 0439  INR 1.5*   Cardiac Enzymes: No results for input(s): CKTOTAL, CKMB, CKMBINDEX, TROPONINI in the last 168 hours. BNP (last 3 results) No results for input(s): PROBNP in the last 8760 hours. HbA1C: No results for input(s): HGBA1C in the last 72 hours. CBG: Recent Labs  Lab 01/22/20 1957 01/23/20 0006 01/23/20 0406 01/23/20 0744 01/23/20 1150  GLUCAP 127* 152* 126* 136* 144*   Lipid Profile: No results for input(s): CHOL, HDL,  LDLCALC, TRIG, CHOLHDL, LDLDIRECT in the last 72 hours. Thyroid Function Tests: Recent Labs    01/22/20 1028 01/22/20 1813  TSH 8.308*  --   FREET4  --  0.70   Anemia Panel: Recent Labs    01/22/20 1028  VITAMINB12 1,469*   Sepsis Labs: Recent Labs  Lab 01/20/20 1321 01/21/20 0437  PROCALCITON 1.41 0.97    Recent Results (from the past 240 hour(s))  SARS CORONAVIRUS 2 (TAT 6-24 HRS) Nasopharyngeal Nasopharyngeal Swab     Status: None   Collection Time: 01/13/20 10:00 PM   Specimen: Nasopharyngeal Swab  Result Value Ref Range Status   SARS Coronavirus 2 NEGATIVE NEGATIVE Final    Comment: (NOTE) SARS-CoV-2 target nucleic acids are NOT DETECTED. The SARS-CoV-2 RNA is generally detectable in upper and lower respiratory specimens during the acute phase of infection. Negative results do not preclude SARS-CoV-2 infection, do not rule out co-infections with other pathogens, and should not be used as the sole basis for treatment or other patient management decisions. Negative results must be combined with clinical observations, patient history, and epidemiological information. The expected result is Negative. Fact Sheet for Patients: SugarRoll.be Fact Sheet for Healthcare Providers: https://www.woods-mathews.com/ This test is not yet approved or cleared by the [place] FDA and  has been authorized for detection and/or diagnosis of SARS-CoV-2 by FDA under an Emergency Use Authorization (EUA). This EUA will remain  in effect (meaning this test can be used) for the duration of the COVID-19 declaration under Section 56 4(b)(1) of the Act, 21 U.S.C. section 360bbb-3(b)(1), unless the authorization is terminated or revoked sooner. Performed at [facility], [place] [address] [place], [place] [address]   Culture, blood (routine x 2)     Status: None   Collection Time: 01/13/20 10:00 PM   Specimen: BLOOD  Result Value Ref Range  Status   Specimen Description   Final    BLOOD BLOOD RIGHT ARM Performed at [facility], [address] [place], [place] [address]    Special Requests   Final    BOTTLES DRAWN AEROBIC AND ANAEROBIC Blood Culture adequate volume Performed at [facility], [address] [place], [place] [address]    Culture   Final    NO GROWTH 5 DAYS Performed at [facility], [place] [address], [place], [place] [address]  Report Status 01/19/2020 FINAL  Final  Urine culture     Status: Abnormal   Collection Time: 01/13/20 10:30 PM   Specimen: Urine, Clean Catch  Result Value Ref Range Status   Specimen Description   Final    URINE, CLEAN CATCH Performed at [facility] [place] [facility], [address] [place], [place] [address]    Special Requests   Final    NONE Performed at [facility] [place] [facility], [address] [place], [place] [address]    Culture >=100,000 COLONIES/mL ENTEROCOCCUS FAECALIS (A)  Final   Report Status 01/16/2020 FINAL  Final   Organism ID, Bacteria ENTEROCOCCUS FAECALIS (A)  Final      Susceptibility   Enterococcus faecalis - MIC*    AMPICILLIN <=2 SENSITIVE Sensitive     NITROFURANTOIN <=16 SENSITIVE Sensitive     VANCOMYCIN 1 SENSITIVE Sensitive     * >=100,000 COLONIES/mL ENTEROCOCCUS FAECALIS  MRSA PCR Screening     Status: None   Collection Time: 01/14/20 10:21 AM   Specimen: Nasal Mucosa; Nasopharyngeal  Result Value Ref Range Status   MRSA by PCR NEGATIVE NEGATIVE Final    Comment:        The GeneXpert MRSA Assay (FDA approved for NASAL specimens only), is one component of a comprehensive MRSA colonization surveillance program. It is not intended to diagnose MRSA infection nor to guide or monitor treatment for MRSA infections. Performed at [facility], [address] [place], [place] [address]   Culture, Urine     Status: Abnormal   Collection Time: 01/14/20  5:48 PM   Specimen: Urine, Catheterized  Result Value Ref  Range Status   Specimen Description URINE, CATHETERIZED  Final   Special Requests   Final    Normal Performed at [facility], [address] [place], [place] [address]    Culture >=100,000 COLONIES/mL ENTEROCOCCUS FAECALIS (A)  Final   Report Status 01/16/2020 FINAL  Final   Organism ID, Bacteria ENTEROCOCCUS FAECALIS (A)  Final      Susceptibility   Enterococcus faecalis - MIC*    AMPICILLIN <=2 SENSITIVE Sensitive     NITROFURANTOIN <=16 SENSITIVE Sensitive     VANCOMYCIN 1 SENSITIVE Sensitive     * >=100,000 COLONIES/mL ENTEROCOCCUS FAECALIS  Culture, respiratory (non-expectorated)     Status: None   Collection Time: 01/20/20  9:47 AM   Specimen: Tracheal Aspirate; Respiratory  Result Value Ref Range Status   Specimen Description TRACHEAL ASPIRATE  Final   Special Requests NONE  Final   Gram Stain   Final    FEW WBC PRESENT,BOTH PMN AND MONONUCLEAR MODERATE GRAM NEGATIVE RODS FEW GRAM POSITIVE COCCI RARE GRAM VARIABLE ROD Performed at [facility], [address] [place], [place] [address]    Culture MODERATE ENTEROBACTER CLOACAE  Final   Report Status 01/22/2020 FINAL  Final   Organism ID, Bacteria ENTEROBACTER CLOACAE  Final      Susceptibility   Enterobacter cloacae - MIC*    CEFAZOLIN >=64 RESISTANT Resistant     CEFEPIME 0.25 SENSITIVE Sensitive     CEFTAZIDIME >=64 RESISTANT Resistant     CIPROFLOXACIN <=0.25 SENSITIVE Sensitive     GENTAMICIN <=1 SENSITIVE Sensitive     IMIPENEM 0.5 SENSITIVE Sensitive     TRIMETH/SULFA <=20 SENSITIVE Sensitive     PIP/TAZO >=128 RESISTANT Resistant     * MODERATE ENTEROBACTER CLOACAE  Culture, blood (routine x 2)     Status: None (  Preliminary result)   Collection Time: 01/20/20 11:00 AM   Specimen: BLOOD RIGHT ARM  Result Value Ref Range Status   Specimen Description BLOOD RIGHT ARM  Final   Special Requests   Final    BOTTLES DRAWN AEROBIC ONLY Blood Culture results may not be optimal due to an inadequate  volume of blood received in culture bottles   Culture   Final    NO GROWTH 3 DAYS Performed at [facility], [address], [place], [place] [address]    Report Status PENDING  Incomplete  Culture, blood (routine x 2)     Status: None (Preliminary result)   Collection Time: 01/20/20 11:06 AM   Specimen: BLOOD RIGHT ARM  Result Value Ref Range Status   Specimen Description BLOOD RIGHT ARM  Final   Special Requests   Final    BOTTLES DRAWN AEROBIC ONLY Blood Culture results may not be optimal due to an inadequate volume of blood received in culture bottles   Culture   Final    NO GROWTH 3 DAYS Performed at [facility], [address], [place], [place] [address]    Report Status PENDING  Incomplete         Radiology Studies: DG Chest Port 1 View  Result Date: 01/22/2020 CLINICAL DATA:  Dyspnea. EXAM: PORTABLE CHEST 1 VIEW COMPARISON:  January 21, 2020. FINDINGS: The heart size and mediastinal contours are within normal limits. Feeding tube is seen entering the stomach. Left-sided pacemaker is unchanged in position. Right internal jugular catheter is unchanged. No pneumothorax is noted. Calcified splenic granulomata are noted bilaterally. Mild bibasilar subsegmental atelectasis is noted with possible small left pleural effusion. The visualized skeletal structures are unremarkable. IMPRESSION: Mild bibasilar subsegmental atelectasis is noted with possible small left pleural effusion. Aortic Atherosclerosis (ICD10-I70.0). Electronically Signed   By: Lupita Raider M.D.   On: 01/22/2020 12:52    Scheduled Meds: . sodium chloride   Intravenous Once  . sodium chloride   Intravenous Once  . sodium chloride   Intravenous Once  . chlorhexidine  15 mL Mouth Rinse BID  . Chlorhexidine Gluconate Cloth  6 each Topical Daily  . feeding supplement (PRO-STAT SUGAR FREE 64)  30 mL Per Tube TID  . free water  200 mL Per Tube Q4H  . furosemide  40 mg Intravenous Q8H  . insulin aspart   0-6 Units Subcutaneous Q4H  . lactulose  20 g Oral TID  . levothyroxine  50 mcg Per Tube Q0600  . mouth rinse  15 mL Mouth Rinse q12n4p  . oxyCODONE  5 mg Per Tube Q12H  . pantoprazole (PROTONIX) IV  40 mg Intravenous Q12H  . sodium chloride flush  10-40 mL Intracatheter Q12H   Continuous Infusions: . sodium chloride 999 mL/hr at 01/14/20 1058  . sodium chloride Stopped (01/20/20 2116)  . feeding supplement (VITAL AF 1.2 CAL) 40 mL/hr at 01/23/20 1200  . potassium chloride 10 mEq (01/23/20 1250)     LOS: 10 days   Time spent= 40 mins    Corry Ihnen Joline Maxcy, MD [facility]  If 7PM-7AM, please contact night-coverage  01/23/2020, 1:22 PM

## 2020-01-23 NOTE — Evaluation (Signed)
Occupational Therapy Evaluation Patient Details Name: Natasha Jacobson MRN: [id number] DOB: 1950-11-13 Today's Date: 01/23/2020    History of Present Illness Pt is a 68 yo female admitted with hemorrhagic shock 2/2 bleeding 15mm pseudoaneurysm and recent instrumentation s/p EGD and coil embolization by IR 4/10. Course also significant for UTI, acute on chronic pancreatitis, and ETT 4/10-4/11; reintubated for encephalopathy and hypoxia 4/12-4/16. PMH also includes: COPD, hearing loss, HTN, thyroid disease   Clinical Impression   Patient admitted for above and limited by problem list below, including generalized weakness, decreased activity tolerance, slow processing, impaired problem solving, decreased endurance, edema, and pain.  Patient with limited verbalizations during session, shakes head no to any mention of ADL engagement/mobilization; requires total assist for all self care, requires total assist +2 to reposition in bed for comfort and hopes to ease RR. RR ranged between mid 30s to max 50, attempted cueing for PLB but poor carryover.  Pt minimally following simple 1 step commands.  She appears chronically ill.  Repositioned BUEs on pillows to support elevation and edema reduction. She will benefit from continued OT services while admitted and after dc at SNF level to optimize return to PLOF with ADLs, mobility and to decrease burden of care.     Follow Up Recommendations  SNF;Supervision/Assistance - 24 hour    Equipment Recommendations  Other (comment)(TBD at next venue of care)    Recommendations for Other Services       Precautions / Restrictions Precautions Precautions: Fall Precaution Comments: cortrak, watch RR rate Restrictions Weight Bearing Restrictions: No      Mobility Bed Mobility Overal bed mobility: Needs Assistance Bed Mobility: Rolling Rolling: Total assist         General bed mobility comments: pt leaning to R side of bed upon entry, repositioned to midline  with total assist; pt resistance and shakes head no to move or assist with movement   Transfers                 General transfer comment: unable/pt unwilling to attempt. Resisting mobility attempts.    Balance                                           ADL either performed or assessed with clinical judgement   ADL Overall ADL's : Needs assistance/impaired     Grooming: Total assistance                               Functional mobility during ADLs: Total assistance;+2 for physical assistance;+2 for safety/equipment General ADL Comments: total assist for all self care at this time      Vision   Additional Comments: further assessment      Perception     Praxis      Pertinent Vitals/Pain Pain Assessment: Faces Faces Pain Scale: Hurts even more Pain Location: Chest Pain Descriptors / Indicators: Guarding;Grimacing;Discomfort Pain Intervention(s): Monitored during session;Repositioned;Limited activity within patient's tolerance     Hand Dominance     Extremity/Trunk Assessment Upper Extremity Assessment Upper Extremity Assessment: Generalized weakness;LUE deficits/detail;RUE deficits/detail RUE Deficits / Details: edema in UE, elevated on pillow; able to minimally squeeze hand but extremely weak (noted plan for DVT rule out therefore limited testing)  LUE Deficits / Details: edema in UE, elevated on pillow; able to minimally squeeze hand but requires  assist for proximal movement to place on pillow, extremely weak  LUE Coordination: decreased gross motor;decreased fine motor   Lower Extremity Assessment Lower Extremity Assessment: Defer to PT evaluation       Communication Communication Communication: Other (comment)(pt with very little verbalizations during session )   Cognition Arousal/Alertness: Lethargic Behavior During Therapy: Flat affect Overall Cognitive Status: Difficult to assess Area of Impairment: Awareness;Following  commands                       Following Commands: Follows one step commands inconsistently;Follows one step commands with increased time   Awareness: Intellectual   General Comments: Pt with very littel verbalizations during session, shakes head mostly.  Able to follow simple commands with increased time: open eyes, squeeze hand, wiggle feet.    General Comments  pt on RA with SpO2 >98% throughout session, cueing for pursed lip breathing to control RR, ranged from mid 30s to 50 at max (with poor carryover)     Exercises Total Joint Exercises Ankle Circles/Pumps: PROM;Both;10 reps Heel Slides: PROM;Both;10 reps Hip ABduction/ADduction: PROM;Both;10 reps   Shoulder Instructions      Home Living Family/patient expects to be discharged to:: Private residence Living Arrangements: Alone Available Help at Discharge: Family(may be able to provide 24/7 assist ) Type of Home: House Home Access: Level entry     Home Layout: One level     Bathroom Shower/Tub: Chief Strategy Officer: Standard     Home Equipment: None   Additional Comments: Daughter present in room to provide information. Daughter lives in [place] and is working on moving back to [place] to take care of her mom.--per PT eval       Prior Functioning/Environment Level of Independence: Independent        Comments: Very active and independent. Maintains her land, pond and animals.        OT Problem List: Decreased strength;Decreased range of motion;Decreased activity tolerance;Impaired balance (sitting and/or standing);Decreased coordination;Decreased cognition;Decreased safety awareness;Decreased knowledge of use of DME or AE;Decreased knowledge of precautions;Cardiopulmonary status limiting activity;Increased edema;Pain;Impaired UE functional use      OT Treatment/Interventions: Self-care/ADL training;Energy conservation;DME and/or AE instruction;Cognitive remediation/compensation;Patient/family  education;Balance training;Therapeutic activities    OT Goals(Current goals can be found in the care plan section) Acute Rehab OT Goals Patient Stated Goal: none stated today  OT Goal Formulation: Patient unable to participate in goal setting Time For Goal Achievement: 02/06/20 Potential to Achieve Goals: Fair  OT Frequency: Min 3X/week   Barriers to D/C:            Co-evaluation PT/OT/SLP Co-Evaluation/Treatment: Yes Reason for Co-Treatment: Complexity of the patient's impairments (multi-system involvement);Necessary to address cognition/behavior during functional activity;For patient/therapist safety;To address functional/ADL transfers   OT goals addressed during session: ADL's and self-care      AM-PAC OT "6 Clicks" Daily Activity     Outcome Measure Help from another person eating meals?: Total Help from another person taking care of personal grooming?: Total Help from another person toileting, which includes using toliet, bedpan, or urinal?: Total Help from another person bathing (including washing, rinsing, drying)?: Total Help from another person to put on and taking off regular upper body clothing?: Total Help from another person to put on and taking off regular lower body clothing?: Total 6 Click Score: 6   End of Session Nurse Communication: Mobility status  Activity Tolerance: Treatment limited secondary to medical complications (Comment);Patient limited by fatigue(RR, fatigue) Patient left: in  bed;with call bell/phone within reach;with bed alarm set  OT Visit Diagnosis: Other abnormalities of gait and mobility (R26.89);Muscle weakness (generalized) (M62.81);Pain;Cognitive communication deficit (R41.841) Pain - part of body: (generalized)                Time: 9390-3009 OT Time Calculation (min): 16 min Charges:  OT General Charges $OT Visit: 1 Visit OT Evaluation $OT Eval Moderate Complexity: 1 Mod  Jolaine Artist, OT Acute Rehabilitation Services Pager  [phone] Office [phone]   Delight Stare 01/23/2020, 10:50 AM

## 2020-01-23 NOTE — Significant Event (Addendum)
Rapid Response Event Note  Overview: Called d/t SpO2-70s on RA.   Initial Focused Assessment: Pt laying in bed in respiratory distress, +WOB, +accessory muscle use. Pt alert and will follow simple commands but will not speak. Lungs with little air movement-pt taking fast, shallow breaths. Skin warm in BUE and cool in BLE. T-101.9, HR-72, BP-91/53, RR-upper 40s, SpO2-83% on 4L [place]. Pt placed on NRB with SpO2 increasing to 87% and, after a few minutes, 93%.   Pt is very well know to RRT. Tachypnea is not new but pt breathing hasn't been labored and pt has been able to maintain her SpO2 on RA up until now. Pt's SBP has been 140-160s and is now 90-100s.   Interventions: NRB PCXR-old granulomatous disease, Bibasilar scarring or atelectasis. Possible small bilateral effusions. Emphysema. ABG-7.29/49.4/108/22.5 Opyd to bedside: Maxipime 2g started, BC x 2 ordered.  PCCM consulted and coming to see pt.  Will transfer to 2M13.   Event Summary:  Dr. Antionette Char notified at 2230 Called: 2219 Arrived: 2223 Ended:0030  Terrilyn Saver

## 2020-01-24 ENCOUNTER — Inpatient Hospital Stay (HOSPITAL_COMMUNITY): Payer: Medicare Other

## 2020-01-24 DIAGNOSIS — J9602 Acute respiratory failure with hypercapnia: Secondary | ICD-10-CM

## 2020-01-24 DIAGNOSIS — K859 Acute pancreatitis without necrosis or infection, unspecified: Secondary | ICD-10-CM | POA: Diagnosis not present

## 2020-01-24 LAB — POCT I-STAT 7, (LYTES, BLD GAS, ICA,H+H)
Acid-base deficit: 7 mmol/L — ABNORMAL HIGH (ref 0.0–2.0)
Acid-base deficit: 6 mmol/L — ABNORMAL HIGH (ref 0.0–2.0)
Acid-base deficit: 7 mmol/L — ABNORMAL HIGH (ref 0.0–2.0)
Bicarbonate: 23.6 mmol/L (ref 20.0–28.0)
Bicarbonate: 20.9 mmol/L (ref 20.0–28.0)
Bicarbonate: 21.2 mmol/L (ref 20.0–28.0)
Calcium, Ion: 1.16 mmol/L (ref 1.15–1.40)
Calcium, Ion: 1.13 mmol/L — ABNORMAL LOW (ref 1.15–1.40)
Calcium, Ion: 1.1 mmol/L — ABNORMAL LOW (ref 1.15–1.40)
HCT: 32 % — ABNORMAL LOW (ref 36.0–46.0)
HCT: 34 % — ABNORMAL LOW (ref 36.0–46.0)
HCT: 33 % — ABNORMAL LOW (ref 36.0–46.0)
Hemoglobin: 10.9 g/dL — ABNORMAL LOW (ref 12.0–15.0)
Hemoglobin: 11.6 g/dL — ABNORMAL LOW (ref 12.0–15.0)
Hemoglobin: 11.2 g/dL — ABNORMAL LOW (ref 12.0–15.0)
O2 Saturation: 98 %
O2 Saturation: 80 %
O2 Saturation: 100 %
Patient temperature: 100
Patient temperature: 98.9
Potassium: 4.1 mmol/L (ref 3.5–5.1)
Potassium: 4.1 mmol/L (ref 3.5–5.1)
Potassium: 4.4 mmol/L (ref 3.5–5.1)
Sodium: 155 mmol/L — ABNORMAL HIGH (ref 135–145)
Sodium: 153 mmol/L — ABNORMAL HIGH (ref 135–145)
Sodium: 153 mmol/L — ABNORMAL HIGH (ref 135–145)
TCO2: 26 mmol/L (ref 22–32)
TCO2: 22 mmol/L (ref 22–32)
TCO2: 23 mmol/L (ref 22–32)
pCO2 arterial: 76.3 mmHg (ref 32.0–48.0)
pCO2 arterial: 48.9 mmHg — ABNORMAL HIGH (ref 32.0–48.0)
pCO2 arterial: 52.6 mmHg — ABNORMAL HIGH (ref 32.0–48.0)
pH, Arterial: 7.102 — CL (ref 7.350–7.450)
pH, Arterial: 7.239 — ABNORMAL LOW (ref 7.350–7.450)
pH, Arterial: 7.213 — ABNORMAL LOW (ref 7.350–7.450)
pO2, Arterial: 156 mmHg — ABNORMAL HIGH (ref 83.0–108.0)
pO2, Arterial: 53 mmHg — ABNORMAL LOW (ref 83.0–108.0)
pO2, Arterial: 258 mmHg — ABNORMAL HIGH (ref 83.0–108.0)

## 2020-01-24 LAB — RENAL FUNCTION PANEL
Albumin: 1.3 g/dL — ABNORMAL LOW (ref 3.5–5.0)
Anion gap: 10 (ref 5–15)
BUN: 98 mg/dL — ABNORMAL HIGH (ref 8–23)
CO2: 22 mmol/L (ref 22–32)
Calcium: 7.1 mg/dL — ABNORMAL LOW (ref 8.9–10.3)
Chloride: 121 mmol/L — ABNORMAL HIGH (ref 98–111)
Creatinine, Ser: 2.31 mg/dL — ABNORMAL HIGH (ref 0.44–1.00)
GFR calc Af Amer: 25 mL/min — ABNORMAL LOW (ref >60)
GFR calc non Af Amer: 21 mL/min — ABNORMAL LOW (ref >60)
Glucose, Bld: 252 mg/dL — ABNORMAL HIGH (ref 70–99)
Phosphorus: 6.8 mg/dL — ABNORMAL HIGH (ref 2.5–4.6)
Potassium: 4.3 mmol/L (ref 3.5–5.1)
Sodium: 153 mmol/L — ABNORMAL HIGH (ref 135–145)

## 2020-01-24 LAB — GLUCOSE, CAPILLARY
Glucose-Capillary: 164 mg/dL — ABNORMAL HIGH (ref 70–99)
Glucose-Capillary: 177 mg/dL — ABNORMAL HIGH (ref 70–99)
Glucose-Capillary: 126 mg/dL — ABNORMAL HIGH (ref 70–99)
Glucose-Capillary: 133 mg/dL — ABNORMAL HIGH (ref 70–99)
Glucose-Capillary: 177 mg/dL — ABNORMAL HIGH (ref 70–99)
Glucose-Capillary: 220 mg/dL — ABNORMAL HIGH (ref 70–99)

## 2020-01-24 LAB — CBC
HCT: 34.7 % — ABNORMAL LOW (ref 36.0–46.0)
HCT: 36 % (ref 36.0–46.0)
Hemoglobin: 10.8 g/dL — ABNORMAL LOW (ref 12.0–15.0)
Hemoglobin: 10.8 g/dL — ABNORMAL LOW (ref 12.0–15.0)
MCH: 30.9 pg (ref 26.0–34.0)
MCH: 30.2 pg (ref 26.0–34.0)
MCHC: 31.1 g/dL (ref 30.0–36.0)
MCHC: 30 g/dL (ref 30.0–36.0)
MCV: 99.1 fL (ref 80.0–100.0)
MCV: 100.6 fL — ABNORMAL HIGH (ref 80.0–100.0)
Platelets: 424 10*3/uL — ABNORMAL HIGH (ref 150–400)
Platelets: 418 10*3/uL — ABNORMAL HIGH (ref 150–400)
RBC: 3.5 MIL/uL — ABNORMAL LOW (ref 3.87–5.11)
RBC: 3.58 MIL/uL — ABNORMAL LOW (ref 3.87–5.11)
RDW: 16.8 % — ABNORMAL HIGH (ref 11.5–15.5)
RDW: 17.2 % — ABNORMAL HIGH (ref 11.5–15.5)
WBC: 31.8 10*3/uL — ABNORMAL HIGH (ref 4.0–10.5)
WBC: 35.6 10*3/uL — ABNORMAL HIGH (ref 4.0–10.5)
nRBC: 0.1 % (ref 0.0–0.2)
nRBC: 0.1 % (ref 0.0–0.2)

## 2020-01-24 LAB — CORTISOL: Cortisol, Plasma: >100 ug/dL

## 2020-01-24 LAB — MAGNESIUM: Magnesium: 2.2 mg/dL (ref 1.7–2.4)

## 2020-01-24 LAB — COMPREHENSIVE METABOLIC PANEL
ALT: 55 U/L — ABNORMAL HIGH (ref 0–44)
AST: 59 U/L — ABNORMAL HIGH (ref 15–41)
Albumin: 1.3 g/dL — ABNORMAL LOW (ref 3.5–5.0)
Alkaline Phosphatase: 87 U/L (ref 38–126)
Anion gap: 10 (ref 5–15)
BUN: 102 mg/dL — ABNORMAL HIGH (ref 8–23)
CO2: 18 mmol/L — ABNORMAL LOW (ref 22–32)
Calcium: 6.9 mg/dL — ABNORMAL LOW (ref 8.9–10.3)
Chloride: 125 mmol/L — ABNORMAL HIGH (ref 98–111)
Creatinine, Ser: 2.71 mg/dL — ABNORMAL HIGH (ref 0.44–1.00)
GFR calc Af Amer: 20 mL/min — ABNORMAL LOW (ref >60)
GFR calc non Af Amer: 18 mL/min — ABNORMAL LOW (ref >60)
Glucose, Bld: 144 mg/dL — ABNORMAL HIGH (ref 70–99)
Potassium: 4.4 mmol/L (ref 3.5–5.1)
Sodium: 153 mmol/L — ABNORMAL HIGH (ref 135–145)
Total Bilirubin: 1.1 mg/dL (ref 0.3–1.2)
Total Protein: 4.6 g/dL — ABNORMAL LOW (ref 6.5–8.1)

## 2020-01-24 LAB — HEMOGLOBIN AND HEMATOCRIT, BLOOD
HCT: 33 % — ABNORMAL LOW (ref 36.0–46.0)
HCT: 33.3 % — ABNORMAL LOW (ref 36.0–46.0)
Hemoglobin: 9.9 g/dL — ABNORMAL LOW (ref 12.0–15.0)
Hemoglobin: 10 g/dL — ABNORMAL LOW (ref 12.0–15.0)

## 2020-01-24 LAB — LACTIC ACID, PLASMA
Lactic Acid, Venous: 2.2 mmol/L (ref 0.5–1.9)
Lactic Acid, Venous: 3.7 mmol/L (ref 0.5–1.9)

## 2020-01-24 LAB — PROCALCITONIN: Procalcitonin: 1.67 ng/mL

## 2020-01-24 LAB — FOLATE RBC
Folate, Hemolysate: 357 ng/mL
Folate, RBC: 1047 ng/mL (ref >498)
Hematocrit: 34.1 % (ref 34.0–46.6)

## 2020-01-24 MED ORDER — LIDOCAINE HCL (PF) 1 % IJ SOLN
INTRAMUSCULAR | Status: AC
  Filled 2020-01-24: qty 5

## 2020-01-24 MED ORDER — SODIUM CHLORIDE 0.9 % IV BOLUS
1000.0000 mL | Freq: Once | INTRAVENOUS | Status: AC
  Administered 2020-01-24: 1000 mL via INTRAVENOUS

## 2020-01-24 MED ORDER — FENTANYL CITRATE (PF) 100 MCG/2ML IJ SOLN: 25.0000 ug | INTRAMUSCULAR | Status: DC | PRN

## 2020-01-24 MED ORDER — ROCURONIUM BROMIDE 10 MG/ML (PF) SYRINGE
70.0000 mg | PREFILLED_SYRINGE | Freq: Once | INTRAVENOUS | Status: AC
  Administered 2020-01-24: 70 mg via INTRAVENOUS

## 2020-01-24 MED ORDER — DOCUSATE SODIUM 50 MG/5ML PO LIQD: 100.0000 mg | Freq: Two times a day (BID) | ORAL | Status: DC

## 2020-01-24 MED ORDER — FENTANYL CITRATE (PF) 100 MCG/2ML IJ SOLN: 25.0000 ug | Freq: Once | INTRAMUSCULAR | Status: DC

## 2020-01-24 MED ORDER — FENTANYL 2500MCG IN NS 250ML (10MCG/ML) PREMIX INFUSION
25.0000 ug/h | INTRAVENOUS | Status: DC
  Administered 2020-01-24: 25 ug/h via INTRAVENOUS
  Administered 2020-01-25: 200 ug/h via INTRAVENOUS
  Filled 2020-01-24 (×3): qty 250

## 2020-01-24 MED ORDER — LACTULOSE 10 GM/15ML PO SOLN
20.0000 g | Freq: Three times a day (TID) | ORAL | Status: DC
  Administered 2020-01-24 (×2): 20 g
  Filled 2020-01-24 (×2): qty 30

## 2020-01-24 MED ORDER — FREE WATER
300.0000 mL | Status: DC
  Administered 2020-01-24 – 2020-01-26 (×12): 300 mL

## 2020-01-24 MED ORDER — FENTANYL BOLUS VIA INFUSION
25.0000 ug | INTRAVENOUS | Status: DC | PRN
  Filled 2020-01-24: qty 25

## 2020-01-24 MED ORDER — POLYETHYLENE GLYCOL 3350 17 G PO PACK: 17.0000 g | PACK | Freq: Every day | ORAL | Status: DC

## 2020-01-24 MED ORDER — VANCOMYCIN HCL IN DEXTROSE 1-5 GM/200ML-% IV SOLN: 1000.0000 mg | INTRAVENOUS | Status: DC

## 2020-01-24 MED ORDER — LACTATED RINGERS IV BOLUS
1000.0000 mL | Freq: Once | INTRAVENOUS | Status: AC
  Administered 2020-01-24: 1000 mL via INTRAVENOUS

## 2020-01-24 MED ORDER — VASOPRESSIN 20 UNIT/ML IV SOLN
0.0300 [IU]/min | INTRAVENOUS | Status: DC
  Administered 2020-01-24 – 2020-01-25 (×2): 0.03 [IU]/min via INTRAVENOUS
  Filled 2020-01-24 (×3): qty 2

## 2020-01-24 MED ORDER — VANCOMYCIN HCL IN DEXTROSE 1-5 GM/200ML-% IV SOLN
1000.0000 mg | Freq: Once | INTRAVENOUS | Status: AC
  Administered 2020-01-24: 1000 mg via INTRAVENOUS
  Filled 2020-01-24: qty 200

## 2020-01-24 MED ORDER — NOREPINEPHRINE 16 MG/250ML-% IV SOLN
0.0000 ug/min | INTRAVENOUS | Status: DC
  Administered 2020-01-24: 2 ug/min via INTRAVENOUS
  Administered 2020-01-26: 3 ug/min via INTRAVENOUS
  Filled 2020-01-24 (×3): qty 250

## 2020-01-24 MED ORDER — ORAL CARE MOUTH RINSE
15.0000 mL | OROMUCOSAL | Status: DC
  Administered 2020-01-24 – 2020-01-26 (×27): 15 mL via OROMUCOSAL

## 2020-01-24 MED ORDER — CHLORHEXIDINE GLUCONATE 0.12% ORAL RINSE (MEDLINE KIT)
15.0000 mL | Freq: Two times a day (BID) | OROMUCOSAL | Status: DC
  Administered 2020-01-24 – 2020-01-26 (×6): 15 mL via OROMUCOSAL

## 2020-01-24 MED ORDER — ETOMIDATE 2 MG/ML IV SOLN
20.0000 mg | Freq: Once | INTRAVENOUS | Status: AC
  Administered 2020-01-24: 20 mg via INTRAVENOUS

## 2020-01-24 MED ORDER — HYDROCORTISONE NA SUCCINATE PF 100 MG IJ SOLR
50.0000 mg | Freq: Four times a day (QID) | INTRAMUSCULAR | Status: DC
  Administered 2020-01-24 – 2020-01-25 (×6): 50 mg via INTRAVENOUS
  Filled 2020-01-24 (×6): qty 2

## 2020-01-24 NOTE — Progress Notes (Signed)
eLink Physician-Brief Progress Note Patient Name: Natasha Jacobson DOB: 09-02-1951 MRN: [id number]   Date of Service  01/24/2020  HPI/Events of Note  ABG on 100%/PRVC 26/TV 440/P5 = 7.239/48.9/53.0  eICU Interventions  Will order: 1. Increase PRVC rate to 32 and increase PEEP to 8. 2. Repeat ABG at 6 PM.      Intervention Category Major Interventions: Respiratory failure - evaluation and management;Acid-Base disturbance - evaluation and management  [name] 01/24/2020, 3:58 AM

## 2020-01-24 NOTE — Progress Notes (Signed)
Patient transferred back to the ICU overnight due to hypoxic/hypercarbic respiratory failure requiring intubation and concerns of septic shock on broad-spectrum antibiotics. Critical care service will take over the care.  TRH will resume care once out of the ICU.  Stephania Fragmin MD

## 2020-01-24 NOTE — Progress Notes (Deleted)
Spoke with E-link RN about Pt's Sp02 down in the low 80's after pt's bath.  Pt is on 100% Fio2, 18 of PEEP and  Nitric 20 ppm. Awaiting to see if any changes need to be made.  

## 2020-01-24 NOTE — Progress Notes (Signed)
CRITICAL VALUE ALERT  Critical Value:  2.2  Date & Time Notied:  01/24/2020  Provider Notified: Agarwala  Orders Received/Actions taken: 1000 mL bolus of LR

## 2020-01-24 NOTE — Procedures (Addendum)
Intubation Procedure Note Natasha Jacobson [id number] [birth year]  Procedure: Intubation Indications: Airway protection and maintenance  Procedure Details Consent: Risks of procedure as well as the alternatives and risks of each were explained to the (patient/caregiver).  Consent for procedure obtained. Time Out: Verified patient identification, verified procedure, site/side was marked, verified correct patient position, special equipment/implants available, medications/allergies/relevent history reviewed, required imaging and test results available.  Performed  Maximum sterile technique was used including cap and gloves.  MAC and 3  Grade 1 view. Intubation with 1 attempt.    Evaluation Hemodynamic Status: BP stable throughout; O2 sats: stable throughout Patient's Current Condition: stable Complications: No apparent complications Patient did tolerate procedure well. Chest X-ray ordered to verify placement.  CXR: tube position acceptable.   Tyna Jaksch 01/24/2020

## 2020-01-24 NOTE — Progress Notes (Signed)
eLink Physician-Brief Progress Note Patient Name: Natasha Jacobson DOB: 1951/05/17 MRN: [id number]   Date of Service  01/24/2020  HPI/Events of Note  ABG on 100%/PRVC 18/TV 380/P 5 = 7.102/76.3/156.0   eICU Interventions  Will order: 1. Increase PRVC rate to 26 and TV to 440.  2. Repeat ABG at 3 AM.      Intervention Category Major Interventions: Respiratory failure - evaluation and management;Acid-Base disturbance - evaluation and management  Everet Flagg Eugene 01/24/2020, 1:23 AM

## 2020-01-24 NOTE — Progress Notes (Signed)
Speech Language Pathology Discharge Patient Details Name: Natasha Jacobson MRN: [id number] DOB: [birth year] Today's Date: 01/24/2020 Time:  -     Patient discharged from SLP services secondary to medical decline - will need to re-order SLP to resume therapy services. Intubated 4/19.   Please see latest therapy progress note for current level of functioning and progress toward goals.    Progress and discharge plan discussed with patient and/or caregiver: Patient unable to participate in discharge planning and no caregivers available  GO     Royce Macadamia 01/24/2020, 9:52 AM    Breck Coons Lonell Face.Ed Nurse, children's [phone] Office [phone]

## 2020-01-24 NOTE — Significant Event (Signed)
2219:  This RN in to check on patient d/t low O2 sat alarm.  Sats 85% on RA, dropping to 70s before placing on 6L/[place].  Patient still with tachypnea, but now also with increased WOB, and respiratory distress, using accessory muscles.  RR 40s, lungs diminished.  Patient responsive but nonverbal.  Rapid response RN, Mindy, notified and asked to come to bedside.  Sats only increased to 83% on 6L/.  BP 90s/50s.  HR 60s-80s. 2224: Mindy, rapid response RN, at bedside.  Patient placed on NRB with sats improving to 93%. 2230: Dr. Antionette Char notified of the above and asked to come to bedside.  Patient feels warm to touch with cool lower extremities.  Rectal temp 101.9. 2245: ABG and portable CXR performed per MD order.  Dr. Antionette Char and RRT RN, Mindy, at bedside. 2315: Critical care consulted to assess patient.  CCM NP, Gunnar Fusi, and MD, Dr. Mikki Harbor, to bedside. 2335: Preparing patient for transfer to ICU. 2355: Patient transferred to 2M13 on NRB and telemetry, accompanied by this RN, RRT RN, Mindy, and Dr. Mikki Harbor.  Report given to Luvenia Heller, RN at bedside.

## 2020-01-24 NOTE — Progress Notes (Signed)
eLink Physician-Brief Progress Note Patient Name: Natasha Jacobson DOB: 1950/12/21 MRN: [id number]   Date of Service  01/24/2020  HPI/Events of Note  Hypotension - CVP = 6-7. Multiple watery stools - Request for Flexiseal.  eICU Interventions  Will order: 1. Bolus with 0.9 NaCl 1 liter IV over 1 hour now.  2. Place Flexiseal.      Intervention Category Major Interventions: Hypovolemia - evaluation and treatment with fluids  Khristian Seals Dennard Nip 01/24/2020, 6:32 AM

## 2020-01-24 NOTE — Progress Notes (Signed)
eLink Physician-Brief Progress Note Patient Name: Natasha Jacobson DOB: [birth year] MRN: [id number]   Date of Service  01/24/2020  HPI/Events of Note  No AM lab orders - Nursing request for CBC with platelets, CMP, Ammonia level and Lactic Acid.   eICU Interventions  Will order: 1. CBC with platelets, CMP, Ammonia level and Lactic Acid level in AM.     Intervention Category Major Interventions: Other:  Lenell Antu 01/24/2020, 8:15 PM

## 2020-01-24 NOTE — Progress Notes (Signed)
NAME:  Natasha Jacobson, MRN:  [id number], DOB:  05/13/1951, LOS: 10 ADMISSION DATE:  01/13/2020, CONSULTATION DATE:  01/14/20 REFERRING MD:  Adela Lank, CHIEF COMPLAINT:  AMS  Brief History   69 year old woman with a hx of alcoholic pancreatitis s/p recent exchange of pancreaticogastrostomy stent at [facility] (4/8) who presented to the ED on 4/9 with hematemesis. On 4/10 she developed hematochezia, encephalopathy, and hypotension. She was transferred to the ICU and taken for emergent EGD that illustrated active upper GI hemorrhage with inadequate visualization for source control. 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 She was subsequently take for selective splenic arteriogram and coiled embolization of the pseudoaneurysm (4/10).   Patient with worsening hypoxic respiratory failure on 4/19 with worsening work of breathing. Minimally responsive and hypotensive. Brought to ICU for further workup and eval.  History of present illness   See above  Past Medical History   Past Medical History:  Diagnosis Date  . 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 Alcohol-induced chronic pancreatitis (HCC)   . Brittle bone disease   . Chronic pain syndrome   . COPD (chronic obstructive pulmonary disease) (HCC)   . Hearing loss, right 07/08/2016  . Hepatitis C    Eradicated with antiviral treatment per patient  . High cholesterol   . Hypertension   . Hypopotassemia   . Hypothyroidism 07/08/2016  . Prediabetes   . SSS (sick sinus syndrome) (HCC) 09/06/2015  . Thyroid disease   . Vitamin D deficiency      Significant Hospital Events   4/9 admitted; mass transfusion protocol 4/10 transfer to ICU; EGD, IR embolization 4/11 extubated 4/12 white count remains elevated at 39k, LFTs significantly elevated; continues to  have dark red stools; hgb stable; minimal responsiveness; reintubated for increased work of breathing 4/13 LFTs improving. hgb stable. More responsive this morning. Minimal improvement in respiratory status. Increased BRBPR>>EGD 4/14 LFTs, renal function improving. More encephalopathic this morning. hgb stable. 4/15 continues to be severely encephalopathic>head CT neg; progressive hypernatremia 148>151 4/16 improved mental status, improved renal function; extubated; off pressors 4/17 remains encephalopathic 4/18 D/c to floor 4/19 Re-intubated and re-admitted to ICU  Consults:  none  Procedures:  4/10 Intubation>>4/11 4/10 central line  4/12 ETT- 4/16 4/19 Intubated    Significant Diagnostic Tests:  CT A/P 01/13/20>>Mild peripancreatic haziness may represent acute on chronic pancreatitis. A pancreatic stent with pigtail ends in the second portion of the duodenum and in the body of the stomach noted. No peripancreatic fluid collection or pseudocyst. Bilateral renal parenchyma wedge-shaped infarcts, new since the prior CT.   4/9 CTA a/p>>5mm pseudoaneurysm over a pancreatic branch of the celiac. Evidence of acute hemorrhage.  -Patient bilateral renal arteries with focal narrowing of the proximal 1cm of right renal artery. -interval distension of the stomach/duodenum likely 2/2 hemorrhage. Dilated proximal small bowel loops. Possible penumatosis involving descending colon  -worsening patchy bilateral low attenuation areas over the renal cortex which may be due to infarction -worsening free peritoneal fluid  4/13 CXR>>bibasilar atelectasis and small left effusion  4/13 EGD>>significant amount of clotted blood present in stomach. Superficial gastric ulcers. Diffuse moderate mucosal changes characterized by congestion, discoloration and decreased vascular pattern in the fundus and gastric body with questionable ischemia. No bleeding source apparent  4/15 CXR> possible small left sided  effusion. Bibasilar atelectasis  4/15 CT head: No acute intracranial abnormality. Acute on chronic sinusitis.  4/19 CXR> Old granulomatous disease. Bibasilar scarring or atelectasis. Possible small bilateral Effusions. Emphysema.  4/20 CXR> Endotracheal tube 3.7 cm above the carina. Airspace disease worsening throughout the left lung, most confluent in the left lung base concerning for pneumonia. Cardiomegaly, COPD.  4/20 XR Abdomen>  Micro Data:  4/9 Urine culture>> ++ e.faecalis 4/9 COVID Neg 4/19 BC> 4/19 Resp culture> 4/19 Urine culture>  Antimicrobials:  4/9 Zosyn x 1 4/10 Ceftriaxone >> 4/12 Ampicillin 4/12>>4/17 Vancomycin 4/19> Cefepime 4/19> Interim history/subjective:  Patient sedated on ventilator. She has high peak pressures of 33 compared to her plateau pressure of 21. Concerned for aspiration with worsening respiratory status acutely.   Objective   Blood pressure (!) 111/26, pulse (!) 101, temperature 98.8 F (37.1 C), temperature source Axillary, resp. rate (!) 32, height 5\' 4"  (1.626 m), weight 53.6 kg, SpO2 100 %. CVP:  [7 mmHg-8 mmHg] 8 mmHg  Vent Mode: PRVC FiO2 (%):  [60 %-100 %] 100 % Set Rate:  [18 bmp-32 bmp] 32 bmp Vt Set:  [380 mL-440 mL] 440 mL PEEP:  [5 cmH20-8 cmH20] 8 cmH20 Plateau Pressure:  [18 cmH20] 18 cmH20   Intake/Output Summary (Last 24 hours) at 01/24/2020 0519 Last data filed at 01/24/2020 0400 Gross per 24 hour  Intake 1491.65 ml  Output 300 ml  Net 1191.65 ml   Filed Weights   01/22/20 0437 01/23/20 0405 01/24/20 0100  Weight: 56.6 kg 57.7 kg 53.6 kg    Examination: Physical Exam  Constitutional: No distress.  HENT:  Head: Normocephalic.  Cardiovascular: Tachycardia present.  Pulmonary/Chest: She is in respiratory distress. She has rales.  Abdominal: Soft. She exhibits no distension.  Musculoskeletal:        General: Edema (diffuse upper and lower extremity) present.  Skin: Skin is warm. She is diaphoretic.     Resolved Hospital Problem list   -Hemorrhagic shock 2/2 bleeding 7mm pseudoaneurysm and recent instrumentation s/p EGD and coil embolization by IR 4/10 -Shock resolved 4/16 -Acute respiratory failure requiring MV resolved 4/16 -E. Faecalis UTI-completed 5d course of ampicillin 4/16 -Acute blood loss anemia resolved 4/17 -Thrombocytopenia resolved 4/17  Assessment & Plan:    Acute hypoxemic respiratory failure presumed due to aspiration Patient intubated yesterday evening for acute hypoxic/respiratory failure. Respiratory cultures pending. CXR shows bibasilar scarring or atelectasis with possible small bilateral effusions. ABG showed acute respiratory acidosis that improved with MV.  - Continue MV, Keep driving pressures below 15 cm H2O and plateau pressures less than 30 cm H20 - Continue Bronchial hygeine - SBT when possible.  - Daily ABG while on ventilator - Continue broad-spectrum AB for left-sided pneumonia.  - Abdominal XR for NG tube placement, likely will need post pyloric tube feeds  Leukocytosis Shock 2/2 to possible sepsis  Patient had a fever overnight as high as 101.9, hypotension, and tachycardia. She has a elevated WBC of 31 and an elevated procal of 1.67. Pan-cultures are pending. Started on Vancomycin and cefepime. Received about 1.4 liters of fluid yesterday. Appears hypovolemic on exam. Continue pressors as needed and wean when possible. - Continue pressors to keep MAPs > 65 in the setting of possible sepsis - Gave another liter bolus of LR today  - Continue q4 hour CVPs - Continue broad-spectrum AB with vanc and cefepime - Cultures pending   AKI Patient has an acute increase in Cr to 2.31 consistent with an AKI. Previously had intrinsic  renal azotemia consistent with ATN. This may be worsened in the setting of hypotension. She had roughly 800 cc of fluid out overnight. - Continue to monitor - Avoid nephrotoxic medication - Replete electrolyte when  possible  GI bleed>splenic pseudoaneurysm>recent instrumentation for pancreatic stent s/p embolization. Patient has had a 1 g drop in Hgb in 24 hours. - continue to monitor hemoglobin.   Hypernatremia: Patient has a Na of 153 and a free water deficits of 2.5L. Patient appears hypervolemic with diffuse edema.  - Restart free water boluses once abdominal XR performed.   Acute metabolic encephalopathy.  Re-intubated yesterday and sedated today on fentanyl.   Protein-caloric malnutrition. NPO due to intubation. Consider restarting post-pyloric tube feeds at 25-50% caloric goal and increase to 100% over next 3-7 days.    Hypothyroidism. Continue synthroid.  Best practice:  Diet: NPO>> trickle feeds Pain/Anxiety/Delirium protocol (if indicated): precedex, cheduled oxy VAP protocol (if indicated): n/a DVT prophylaxis: SCDs GI prophylaxis: PPI bid Glucose control: SSI Mobility: BR Code Status: Full Family Communication: Nevin Bloodgood attempted to call Ericka last night at [phone] Disposition: ICU  Labs   CBC: Recent Labs  Lab 01/18/20 0334 01/18/20 0930 01/20/20 0600 01/20/20 1321 01/21/20 0437 01/21/20 0437 01/22/20 0437 01/23/20 0520 01/24/20 0113 01/24/20 0202 01/24/20 0341  WBC 16.5*   < > 21.7*  --  27.0*  --  24.9* 24.3*  --  31.8*  --   NEUTROABS 13.7*  --   --   --   --   --   --   --   --   --   --   HGB 11.2*   < > 11.3*   < > 11.4*   < > 11.8* 11.8* 10.9* 10.8* 11.6*  HCT 33.3*   < > 35.1*   < > 35.0*   < > 35.2* 35.1* 32.0* 34.7* 34.0*  MCV 91.7   < > 95.4  --  92.1  --  91.9 92.4  --  99.1  --   PLT 53*   < > 111*  --  176  --  287 411*  --  424*  --    < > = values in this interval not displayed.    Basic Metabolic Panel: Recent Labs  Lab 01/21/20 1526 01/21/20 1526 01/22/20 0437 01/22/20 0437 01/22/20 1813 01/23/20 0520 01/24/20 0113 01/24/20 0202 01/24/20 0341  NA 152*   < > 149*   < > 150* 151* 155* 153* 153*  K 3.4*   < > 2.9*   < > 3.3*  2.9* 4.1 4.3 4.1  CL 120*  --  120*  --  117* 114*  --  121*  --   CO2 20*  --  21*  --  22 23  --  22  --   GLUCOSE 143*  --  157*  --  148* 148*  --  252*  --   BUN 114*  --  101*  --  94* 93*  --  98*  --   CREATININE 2.46*  --  2.17*  --  2.03* 2.02*  --  2.31*  --   CALCIUM 7.7*  --  7.4*  --  7.6* 7.7*  --  7.1*  --   MG  --   --   --   --   --  1.6*  --  2.2  --   PHOS  --   --   --   --   --   --   --  6.8*  --    < > = values in this interval not displayed.   GFR: Estimated Creatinine Clearance: 20.3 mL/min (A) (by C-G formula based on SCr of 2.31 mg/dL (H)). Recent Labs  Lab 01/20/20 0600 01/20/20 1321 01/21/20 0437 01/22/20 0437 01/23/20 0520 01/24/20 0202  PROCALCITON  --  1.41 0.97  --   --  1.67  WBC   < >  --  27.0* 24.9* 24.3* 31.8*   < > = values in this interval not displayed.    Liver Function Tests: Recent Labs  Lab 01/19/20 0700 01/19/20 0700 01/20/20 0600 01/21/20 0437 01/22/20 0437 01/23/20 0520 01/24/20 0202  AST 100*  --  55* 61* 48* 36  --   ALT 201*  --  139* 107* 83* 60*  --   ALKPHOS 55  --  53 72 76 98  --   BILITOT 1.1  --  0.7 0.7 1.1 1.1  --   PROT 4.1*  --  3.9* 4.1* 4.3* 4.7*  --   ALBUMIN 1.6*   < > 1.5* 1.6* 1.5* 1.5* 1.3*   < > = values in this interval not displayed.   No results for input(s): LIPASE, AMYLASE in the last 168 hours. Recent Labs  Lab 01/22/20 1028  AMMONIA 44*    ABG    Component Value Date/Time   PHART 7.239 (L) 01/24/2020 0341   PCO2ART 48.9 (H) 01/24/2020 0341   PO2ART 53.0 (L) 01/24/2020 0341   HCO3 20.9 01/24/2020 0341   TCO2 22 01/24/2020 0341   ACIDBASEDEF 6.0 (H) 01/24/2020 0341   O2SAT 80.0 01/24/2020 0341     Coagulation Profile: No results for input(s): INR, PROTIME in the last 168 hours.  Cardiac Enzymes: No results for input(s): CKTOTAL, CKMB, CKMBINDEX, TROPONINI in the last 168 hours.  HbA1C: Hgb A1c MFr Bld  Date/Time Value Ref Range Status  01/14/2020 09:40 PM 5.4 4.8 - 5.6 %  Final    Comment:    (NOTE)         Prediabetes: 5.7 - 6.4         Diabetes: >6.4         Glycemic control for adults with diabetes: <7.0     CBG: Recent Labs  Lab 01/23/20 1150 01/23/20 1635 01/23/20 1958 01/24/20 0117 01/24/20 0331  GLUCAP 144* 167* 134* 164* 177*    Review of Systems:   Patient unable to provide ROS due to MV.  Past Medical History  She,  has a past medical history of Adhesive capsulitis of left shoulder (06/06/2016), Alcohol-induced chronic pancreatitis (HCC), Brittle bone disease, Chronic pain syndrome, COPD (chronic obstructive pulmonary disease) (HCC), Hearing loss, right (07/08/2016), Hepatitis C, High cholesterol, Hypertension, Hypopotassemia, Hypothyroidism (07/08/2016), Prediabetes, SSS (sick sinus syndrome) (HCC) (09/06/2015), Thyroid disease, and Vitamin D deficiency.   Surgical History    Past Surgical History:  Procedure Laterality Date  . ABDOMINAL HYSTERECTOMY    . APPENDECTOMY    . ERCP     multiple with stents placed   . ESOPHAGOGASTRODUODENOSCOPY N/A 01/17/2020   Procedure: ESOPHAGOGASTRODUODENOSCOPY (EGD);  Surgeon: Lemar Lofty., MD;  Location: [facility] ENDOSCOPY;  Service: Gastroenterology;  Laterality: N/A;  . ESOPHAGOGASTRODUODENOSCOPY (EGD) WITH PROPOFOL N/A 01/14/2020   Procedure: ESOPHAGOGASTRODUODENOSCOPY (EGD) WITH PROPOFOL;  Surgeon: Benancio Deeds, MD;  Location: [facility] ENDOSCOPY;  Service: Gastroenterology;  Laterality: N/A;  . FOREIGN BODY REMOVAL  01/17/2020   Procedure: FOREIGN BODY REMOVAL;  Surgeon: Meridee Score Netty Starring., MD;  Location: [facility] ENDOSCOPY;  Service:  Gastroenterology;;  gastric lavage  . IR ANGIOGRAM SELECTIVE EACH ADDITIONAL VESSEL  01/15/2020  . IR ANGIOGRAM VISCERAL SELECTIVE  01/15/2020  . IR EMBO ART  VEN HEMORR LYMPH EXTRAV  INC GUIDE ROADMAPPING  01/15/2020  . IR US GUIDE VASC ACCESS RIGHT  01/15/2020  . PACEMAKER IMPLANT    . TONSILLECTOMY       Social History   reports that she has been smoking  cigarettes. She has never used smokeless tobacco. She reports previous alcohol use. She reports previous drug use.   Family History   Her family history includes Emphysema in her mother; Heart attack in her brother; Heart block in her father; Lung cancer in her mother; Thyroid disease in her daughter, granddaughter, and mother.   Allergies Allergies  Allergen Reactions  . Buprenorphine Nausea Only  . Aspirin Nausea Only    GI upset GI upset      Home Medications  Prior to Admission medications   Medication Sig Start Date End Date Taking? Authorizing Provider  acetaminophen (TYLENOL) 500 MG tablet Take 2 tablets by mouth every 8 (eight) hours as needed. 11/01/19   [provider]  albuterol (VENTOLIN HFA) 108 (90 Base) MCG/ACT inhaler Inhale 2 puffs into the lungs every 4 (four) hours as needed. 11/24/18   [provider]  amLODipine (NORVASC) 5 MG tablet Take 1 tablet by mouth daily. 09/22/19   [provider]  baclofen (LIORESAL) 10 MG tablet Take 1 tablet by mouth daily as needed. 11/11/19   [provider]  ergocalciferol (VITAMIN D2) 1.25 MG (50000 UT) capsule Take 1 capsule by mouth once a week. 11/18/19   [provider]  fenofibrate (TRICOR) 48 MG tablet Take 1 tablet by mouth daily. 05/13/18   [provider]  gabapentin (NEURONTIN) 300 MG capsule Take 1 capsule by mouth 3 (three) times daily. 11/11/19 11/10/20  [provider]  levothyroxine (SYNTHROID) 50 MCG tablet Take 50 mcg by mouth daily before breakfast.    [provider]  Melatonin 5 MG CAPS Take 2 capsules by mouth at bedtime.    [provider]  Oxycodone HCl 10 MG TABS Take 10 mg by mouth every 6 (six) hours as needed for pain. 01/12/20   [provider]  Pancrelipase, Lip-Prot-Amyl, (CREON) 24000-76000 units CPEP Take 1 capsule by mouth 3 (three) times daily. 08/18/19   [provider]  pantoprazole (PROTONIX) 40 MG tablet Take 40  mg by mouth daily.    [provider]  polyethylene glycol powder (GLYCOLAX/MIRALAX) 17 GM/SCOOP powder Take 17 g by mouth as needed.    [provider]  senna (SENOKOT) 8.6 MG TABS tablet Take 1 tablet by mouth as needed for mild constipation.    [provider]  Tiotropium Bromide Monohydrate (SPIRIVA RESPIMAT) 1.25 MCG/ACT AERS Inhale 2 puffs into the lungs daily. 11/15/19 11/14/20  [provider]     Critical care time:     Dellia Cloud, D.O. [facility] Internal Medicine, PGY-1 Pager: [phone], Phone: [phone] Date 01/24/2020 Time 11:10 AM

## 2020-01-25 DIAGNOSIS — Z7189 Other specified counseling: Secondary | ICD-10-CM

## 2020-01-25 DIAGNOSIS — N179 Acute kidney failure, unspecified: Secondary | ICD-10-CM | POA: Diagnosis not present

## 2020-01-25 DIAGNOSIS — J189 Pneumonia, unspecified organism: Secondary | ICD-10-CM | POA: Diagnosis not present

## 2020-01-25 DIAGNOSIS — Z515 Encounter for palliative care: Secondary | ICD-10-CM

## 2020-01-25 DIAGNOSIS — Y95 Nosocomial condition: Secondary | ICD-10-CM

## 2020-01-25 DIAGNOSIS — Z66 Do not resuscitate: Secondary | ICD-10-CM | POA: Diagnosis not present

## 2020-01-25 DIAGNOSIS — R6521 Severe sepsis with septic shock: Secondary | ICD-10-CM

## 2020-01-25 DIAGNOSIS — J9601 Acute respiratory failure with hypoxia: Secondary | ICD-10-CM | POA: Diagnosis not present

## 2020-01-25 DIAGNOSIS — K852 Alcohol induced acute pancreatitis without necrosis or infection: Secondary | ICD-10-CM | POA: Diagnosis not present

## 2020-01-25 DIAGNOSIS — R578 Other shock: Secondary | ICD-10-CM | POA: Diagnosis not present

## 2020-01-25 DIAGNOSIS — G9340 Encephalopathy, unspecified: Secondary | ICD-10-CM | POA: Diagnosis not present

## 2020-01-25 DIAGNOSIS — K920 Hematemesis: Secondary | ICD-10-CM | POA: Diagnosis not present

## 2020-01-25 DIAGNOSIS — A419 Sepsis, unspecified organism: Secondary | ICD-10-CM

## 2020-01-25 LAB — GLUCOSE, CAPILLARY
Glucose-Capillary: 201 mg/dL — ABNORMAL HIGH (ref 70–99)
Glucose-Capillary: 220 mg/dL — ABNORMAL HIGH (ref 70–99)
Glucose-Capillary: 221 mg/dL — ABNORMAL HIGH (ref 70–99)
Glucose-Capillary: 221 mg/dL — ABNORMAL HIGH (ref 70–99)
Glucose-Capillary: 222 mg/dL — ABNORMAL HIGH (ref 70–99)
Glucose-Capillary: 240 mg/dL — ABNORMAL HIGH (ref 70–99)
Glucose-Capillary: 242 mg/dL — ABNORMAL HIGH (ref 70–99)

## 2020-01-25 LAB — CULTURE, BLOOD (ROUTINE X 2)
Culture: NO GROWTH
Culture: NO GROWTH

## 2020-01-25 LAB — POCT I-STAT 7, (LYTES, BLD GAS, ICA,H+H)
Acid-base deficit: 8 mmol/L — ABNORMAL HIGH (ref 0.0–2.0)
Bicarbonate: 17.5 mmol/L — ABNORMAL LOW (ref 20.0–28.0)
Calcium, Ion: 1.2 mmol/L (ref 1.15–1.40)
HCT: 28 % — ABNORMAL LOW (ref 36.0–46.0)
Hemoglobin: 9.5 g/dL — ABNORMAL LOW (ref 12.0–15.0)
O2 Saturation: 96 %
Patient temperature: 98.3
Potassium: 4.3 mmol/L (ref 3.5–5.1)
Sodium: 148 mmol/L — ABNORMAL HIGH (ref 135–145)
TCO2: 19 mmol/L — ABNORMAL LOW (ref 22–32)
pCO2 arterial: 35.8 mmHg (ref 32.0–48.0)
pH, Arterial: 7.298 — ABNORMAL LOW (ref 7.350–7.450)
pO2, Arterial: 88 mmHg (ref 83.0–108.0)

## 2020-01-25 LAB — COMPREHENSIVE METABOLIC PANEL
ALT: 76 U/L — ABNORMAL HIGH (ref 0–44)
AST: 118 U/L — ABNORMAL HIGH (ref 15–41)
Albumin: 1.3 g/dL — ABNORMAL LOW (ref 3.5–5.0)
Alkaline Phosphatase: 92 U/L (ref 38–126)
Anion gap: 10 (ref 5–15)
BUN: 118 mg/dL — ABNORMAL HIGH (ref 8–23)
CO2: 18 mmol/L — ABNORMAL LOW (ref 22–32)
Calcium: 7.5 mg/dL — ABNORMAL LOW (ref 8.9–10.3)
Chloride: 121 mmol/L — ABNORMAL HIGH (ref 98–111)
Creatinine, Ser: 3.24 mg/dL — ABNORMAL HIGH (ref 0.44–1.00)
GFR calc Af Amer: 16 mL/min — ABNORMAL LOW (ref >60)
GFR calc non Af Amer: 14 mL/min — ABNORMAL LOW (ref >60)
Glucose, Bld: 273 mg/dL — ABNORMAL HIGH (ref 70–99)
Potassium: 4.3 mmol/L (ref 3.5–5.1)
Sodium: 149 mmol/L — ABNORMAL HIGH (ref 135–145)
Total Bilirubin: 0.7 mg/dL (ref 0.3–1.2)
Total Protein: 4.6 g/dL — ABNORMAL LOW (ref 6.5–8.1)

## 2020-01-25 LAB — CBC WITH DIFFERENTIAL/PLATELET
Abs Immature Granulocytes: 1.11 10*3/uL — ABNORMAL HIGH (ref 0.00–0.07)
Basophils Absolute: 0.1 10*3/uL (ref 0.0–0.1)
Basophils Relative: 0 %
Eosinophils Absolute: 0 10*3/uL (ref 0.0–0.5)
Eosinophils Relative: 0 %
HCT: 31.9 % — ABNORMAL LOW (ref 36.0–46.0)
Hemoglobin: 9.7 g/dL — ABNORMAL LOW (ref 12.0–15.0)
Immature Granulocytes: 3 %
Lymphocytes Relative: 4 %
Lymphs Abs: 1.6 10*3/uL (ref 0.7–4.0)
MCH: 30.4 pg (ref 26.0–34.0)
MCHC: 30.4 g/dL (ref 30.0–36.0)
MCV: 100 fL (ref 80.0–100.0)
Monocytes Absolute: 1.7 10*3/uL — ABNORMAL HIGH (ref 0.1–1.0)
Monocytes Relative: 5 %
Neutro Abs: 32.8 10*3/uL — ABNORMAL HIGH (ref 1.7–7.7)
Neutrophils Relative %: 88 %
Platelets: 396 10*3/uL (ref 150–400)
RBC: 3.19 MIL/uL — ABNORMAL LOW (ref 3.87–5.11)
RDW: 17.5 % — ABNORMAL HIGH (ref 11.5–15.5)
WBC: 36.8 10*3/uL — ABNORMAL HIGH (ref 4.0–10.5)
nRBC: 0.4 % — ABNORMAL HIGH (ref 0.0–0.2)

## 2020-01-25 LAB — SODIUM, URINE, RANDOM: Sodium, Ur: <10 mmol/L

## 2020-01-25 LAB — AMMONIA: Ammonia: 37 umol/L — ABNORMAL HIGH (ref 9–35)

## 2020-01-25 LAB — LACTIC ACID, PLASMA: Lactic Acid, Venous: 1.7 mmol/L (ref 0.5–1.9)

## 2020-01-25 LAB — URINE CULTURE

## 2020-01-25 LAB — MAGNESIUM: Magnesium: 2.4 mg/dL (ref 1.7–2.4)

## 2020-01-25 LAB — CREATININE, URINE, RANDOM: Creatinine, Urine: 71.08 mg/dL

## 2020-01-25 MED ORDER — POLYETHYLENE GLYCOL 3350 17 G PO PACK: 17.0000 g | PACK | Freq: Every day | ORAL | Status: DC

## 2020-01-25 MED ORDER — INSULIN ASPART 100 UNIT/ML ~~LOC~~ SOLN: 2.0000 [IU] | SUBCUTANEOUS | Status: DC

## 2020-01-25 MED ORDER — VITAL AF 1.2 CAL PO LIQD
1000.0000 mL | ORAL | Status: DC
  Administered 2020-01-25 – 2020-01-26 (×2): 1000 mL

## 2020-01-25 MED ORDER — STERILE WATER FOR INJECTION IV SOLN
INTRAVENOUS | Status: AC
  Filled 2020-01-25 (×2): qty 1000

## 2020-01-25 MED ORDER — LINEZOLID 600 MG/300ML IV SOLN
600.0000 mg | Freq: Two times a day (BID) | INTRAVENOUS | Status: DC
  Administered 2020-01-25 – 2020-01-26 (×3): 600 mg via INTRAVENOUS
  Filled 2020-01-25 (×3): qty 300

## 2020-01-25 MED ORDER — OXYCODONE HCL 5 MG/5ML PO SOLN
5.0000 mg | Freq: Four times a day (QID) | ORAL | Status: DC
  Administered 2020-01-25 – 2020-01-29 (×16): 5 mg
  Filled 2020-01-25 (×16): qty 5

## 2020-01-25 MED ORDER — DOCUSATE SODIUM 50 MG/5ML PO LIQD: 100.0000 mg | Freq: Two times a day (BID) | ORAL | Status: DC

## 2020-01-25 MED ORDER — DEXMEDETOMIDINE HCL IN NACL 200 MCG/50ML IV SOLN
0.4000 ug/kg/h | INTRAVENOUS | Status: DC
  Filled 2020-01-25: qty 50

## 2020-01-25 MED ORDER — POLYETHYLENE GLYCOL 3350 17 G PO PACK: 17.0000 g | PACK | Freq: Every day | ORAL | Status: DC | PRN

## 2020-01-25 MED ORDER — DEXMEDETOMIDINE HCL IN NACL 400 MCG/100ML IV SOLN
0.4000 ug/kg/h | INTRAVENOUS | Status: DC
  Administered 2020-01-25: 0.8 ug/kg/h via INTRAVENOUS
  Administered 2020-01-25: 1 ug/kg/h via INTRAVENOUS
  Administered 2020-01-26: 0.6 ug/kg/h via INTRAVENOUS
  Filled 2020-01-25 (×5): qty 100

## 2020-01-25 MED ORDER — DOCUSATE SODIUM 50 MG/5ML PO LIQD: 100.0000 mg | Freq: Every day | ORAL | Status: DC | PRN

## 2020-01-25 NOTE — Progress Notes (Signed)
Palliative-  Thank you for this consult. Chart reviewed- contacted daughterJamesetta Orleans- plan to meet today at 2pm.   [name], AGNP-C Palliative Medicine  Please call Palliative Medicine team phone with any questions [phone]. For individual providers please see AMION.  No charge

## 2020-01-25 NOTE — Progress Notes (Signed)
NAME:Natasha Jacobson, NAT:[id number], DOB:11-Oct-1950, LOS:10 ADMISSION DATE:01/13/2020, CONSULTATION DATE: 01/14/20 REFERRING MD: Havery Moros, CHIEF COMPLAINT:AMS  Brief History   Natasha Jacobson is a 69 y.o. 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   History of present illness   69 year old woman with a hx of alcoholic pancreatitis s/p recent exchange of pancreaticogastrostomy stent at [facility] (4/8) who presented to the ED on 4/9 with hematemesis. On 4/10 she developed hematochezia, encephalopathy, and hypotension. She was transferred to the ICU and taken for emergent EGD that illustrated active upper GI hemorrhage with inadequate visualization for source control. 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 She was subsequently take for selective splenic arteriogram and coiled embolization of the pseudoaneurysm (4/10).  Patient with worsening hypoxic respiratory failure on 4/19 with worsening work of breathing. Minimally responsive and hypotensive. Brought to ICU for further workup and eval.  Past Medical History   Past Medical History:  Diagnosis Date  . 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 Alcohol-induced chronic pancreatitis ([place])   . Brittle bone disease   . Chronic pain syndrome   . COPD (chronic obstructive pulmonary disease) ([place])   . Hearing loss, right 07/08/2016  . Hepatitis C    Eradicated with antiviral treatment per patient  . High cholesterol   . Hypertension   .  Hypopotassemia   . Hypothyroidism 07/08/2016  . Prediabetes   . SSS (sick sinus syndrome) ([place]) 09/06/2015  . Thyroid disease   . Vitamin D deficiency      Significant Hospital Events   4/9 admitted; mass transfusion protocol 4/10 transfer to ICU; EGD, IR embolization 4/11 extubated 4/12 white count remains elevated at 39k, LFTs significantly elevated; continues to have dark red stools; hgb stable; minimal responsiveness; reintubated for increased work of breathing 4/13 LFTs improving. hgb stable. More responsive this morning. Minimal improvement in respiratory status. Increased BRBPR>>EGD 4/14 LFTs, renal function improving. More encephalopathic this morning. hgb stable. 4/15continues to be severely encephalopathic>head CT neg; progressive hypernatremia 148>151 4/16 improved mental status, improved renal function; extubated; off pressors 4/17 remains encephalopathic 4/18 D/c to floor 4/19 Re-intubated and re-admitted to ICU  Consults:  none  Procedures:  4/10 Intubation>>4/11 4/10 central line 4/12 ETT- 4/16 4/19 Intubated>>  Significant Diagnostic Tests:  CT A/P 01/13/20>>Mild peripancreatic haziness may represent acute on chronicpancreatitis. A pancreatic stent with pigtail ends in the second portion of the duodenum and in the body of the stomach noted. No peripancreatic fluid collection or pseudocyst. Bilateral renal parenchyma wedge-shaped infarcts, new since the prior CT.   4/9 CTA a/p>>87mm pseudoaneurysm over a pancreatic branch of the celiac. Evidence of acute hemorrhage.  -Patient bilateral renal arteries with focal narrowing of the proximal 1cm of right renal artery. -interval distension of the stomach/duodenum likely 2/2 hemorrhage. Dilated proximal small bowel loops. Possible penumatosis involving descending colon  -worsening patchy bilateral low attenuation areas over the renal cortex which may be due to infarction -worsening free peritoneal fluid  4/13  CXR>>bibasilar atelectasis and small left effusion  4/13 EGD>>significant amount of clotted  blood present in stomach. Superficial gastric ulcers. Diffuse moderate mucosal changes characterized by congestion, discoloration and decreased vascular pattern in the fundus and gastric body with questionable ischemia. No bleeding source apparent  4/15 CXR> possible small left sided effusion. Bibasilar atelectasis  4/15 CT head: No acute intracranial abnormality. Acute on chronic sinusitis.  4/19 CXR> Old granulomatous disease. Bibasilar scarring or atelectasis. Possible small bilateral Effusions. Emphysema.  4/20 CXR> Endotracheal tube 3.7 cm above the carina. Airspace disease worsening throughout the left lung, most confluent in the left lung base concerning for pneumonia. Cardiomegaly, COPD.  4/20 XR Abdomen> Dobbhoff tube with the tip projecting over the jejunum.  Micro Data:  4/9 Urine culture>> ++ e.faecalis 4/9 COVID Neg 4/19 BC> pending 4/19 Resp culture> GPC chains/S. Aureus  4/19 Urine culture> pending   Antimicrobials:  4/9 Zosyn x 1 4/10 Ceftriaxone >> 4/12 Ampicillin 4/12>>4/17 Cefepime 4/19>4/9 Zosyn x 1 4/10 Ceftriaxone >> 4/12 Ampicillin 4/12>>4/17  Vancomycin 4/19>4/20 Cefepime 4/19>4/21  Linezolid 4/21  Interim history/subjective:  Patient still requiring pressors and sedation. She appears extravascular volume overloaded. Otherwise, patient sedated and minimally responding to stimuli.  Objective   Blood pressure (!) 114/55, pulse 85, temperature 98.3 F (36.8 C), temperature source Oral, resp. rate (!) 32, height 5\' 4"  (1.626 m), weight 54 kg, SpO2 100 %. CVP:  [8 mmHg-13 mmHg] 12 mmHg  Vent Mode: PRVC FiO2 (%):  [40 %-70 %] 40 % Set Rate:  [32 bmp] 32 bmp Vt Set:  [440 mL] 440 mL PEEP:  [5 cmH20-8 cmH20] 5 cmH20 Plateau Pressure:  [22 cmH20-24 cmH20] 24 cmH20   Intake/Output Summary (Last 24 hours) at 01/25/2020 0643 Last data filed at 01/24/2020  2200 Gross per 24 hour  Intake 3713.64 ml  Output 925 ml  Net 2788.64 ml   Filed Weights   01/23/20 0405 01/24/20 0100 01/25/20 0500  Weight: 57.7 kg 53.6 kg 54 kg    Examination: Physical Exam  Constitutional: No distress.  HENT:  Head: Normocephalic and atraumatic.  Cardiovascular: Normal rate and intact distal pulses.  No murmur heard. Pulmonary/Chest: She is in respiratory distress.  On ventilator   Abdominal: Soft. She exhibits no distension.  Musculoskeletal:        General: Edema present. Normal range of motion.  Neurological:  Sedated   Skin: Skin is warm. She is diaphoretic.    Resolved Hospital Problem list   -Hemorrhagic shock 2/2 bleeding 17mm pseudoaneurysm and recent instrumentation s/p EGD and coil embolization by IR 4/10 -Shock resolved 4/16 -Acute respiratory failure requiring MV resolved 4/16 -E. Faecalis UTI-completed 5d course of ampicillin 4/16 -Acute blood loss anemia resolved 4/17 -Thrombocytopenia resolved 4/17  Assessment & Plan:  Acute hypoxemic respiratory failure presumed due to HAP Patient intubated yesterday evening for acute hypoxic/respiratory failure. CXR shows lower lung consolidation consistent with pneumonia. Respiratory cultures growing staph aureus. Switched to Linezolid for antibiotic coverage.  - Begin SBT when possible. - Continue Bronchial hygiene - RASS -1 to -2, start Oxy, precedex and fentanyl and titrate to achieve RASS goals.    Leukocytosis Shock 2/2 to possible sepsis  Patient continues to have signs and symptoms consistent with septic shock. She had a fever overnight as high as 101.68F, elevated WBC of 36.8 on steroids, and requiring vaso and levo to maintain MAPS >65. Switched patient to Linezolid due to sputum cultures growing GPC/staph aureus. She appears to be extravascular vascularly volume overloaded.  - Continue pressors to keep MAPs > 65 in the setting of possible sepsis  -  Continue q4 hour CVPs - Continue  broad-spectrum AB with linezolid - Discontinue stress dosed steroids.  - Cultures pending   AKI Patient has worsening Cr at 3.24 with only roughly 400 cc urine output in the last 12 hours concerning for ATN in the setting of sepsis. Appears extramacularly volume overloaded.  - FeNa pending  - Avoid nephrotoxic medication - Replete electrolyte when possible - Consider giving albumin depending of urine output today.  GI bleed>splenic pseudoaneurysm>recent instrumentation for pancreatic stent s/p embolization. Patient has had a 1 g drop in Hgb in 24 hours. - continue to monitor hemoglobin.   Hypernatremia: Patient has a Na of 149.  - Continue free water boluses.  Acutemetabolicencephalopathy. Re-intubated yesterday and sedated today on fentanyl.   Protein-caloric malnutrition. NPO due to intubation. Consider restarting post-pyloric tube feeds at 25-50% caloric goal and increase to 100% over next 3-7 days.    Hypothyroidism. Continue synthroid.  Best practice:  Diet: NPO>> tube feeds Pain/Anxiety/Delirium protocol (if indicated): precedex, oxy, fentanyl  VAP protocol (if indicated):VAP protocol initiated  DVT prophylaxis: SCDs GI prophylaxis: PPI  Glucose control: SSI Mobility: BR Code Status: Full - palliative consulted.  Family Communication: Contacted today. Disposition: ICU  Labs   CBC: Recent Labs  Lab 01/22/20 0437 01/22/20 1028 01/23/20 0520 01/24/20 0113 01/24/20 0202 01/24/20 0341 01/24/20 0602 01/24/20 0952 01/24/20 1219 01/24/20 1759 01/25/20 0445  WBC 24.9*  --  24.3*  --  31.8*  --   --  35.6*  --   --  36.8*  NEUTROABS  --   --   --   --   --   --   --   --   --   --  32.8*  HGB 11.8*  --  11.8*   < > 10.8*   < > 11.2* 10.8* 9.9* 10.0* 9.7*  HCT 35.2*   < > 35.1*   < > 34.7*   < > 33.0* 36.0 33.0* 33.3* 31.9*  MCV 91.9  --  92.4  --  99.1  --   --  100.6*  --   --  100.0  PLT 287  --  411*  --  424*  --   --  418*  --   --  396   < > =  values in this interval not displayed.    Basic Metabolic Panel: Recent Labs  Lab 01/22/20 1813 01/22/20 1813 01/23/20 0520 01/24/20 0113 01/24/20 0202 01/24/20 0341 01/24/20 0602 01/24/20 0952 01/25/20 0445  NA 150*   < > 151*   < > 153* 153* 153* 153* 149*  K 3.3*   < > 2.9*   < > 4.3 4.1 4.4 4.4 4.3  CL 117*  --  114*  --  121*  --   --  125* 121*  CO2 22  --  23  --  22  --   --  18* 18*  GLUCOSE 148*  --  148*  --  252*  --   --  144* 273*  BUN 94*  --  93*  --  98*  --   --  102* 118*  CREATININE 2.03*  --  2.02*  --  2.31*  --   --  2.71* 3.24*  CALCIUM 7.6*  --  7.7*  --  7.1*  --   --  6.9* 7.5*  MG  --   --  1.6*  --  2.2  --   --   --  2.4  PHOS  --   --   --   --  6.8*  --   --   --   --    < > = values in this interval not displayed.   GFR: Estimated Creatinine Clearance: 14.6 mL/min (A) (by C-G formula based on SCr of 3.24 mg/dL (H)). Recent Labs  Lab 01/20/20 0600 01/20/20 1321 01/21/20 0437 01/22/20 0437 01/23/20 0520 01/24/20 0202 01/24/20 0952 01/24/20 1514 01/25/20 0445  PROCALCITON  --  1.41 0.97  --   --  1.67  --   --   --   WBC   < >  --  27.0*   < > 24.3* 31.8* 35.6*  --  36.8*  LATICACIDVEN  --   --   --   --   --   --  2.2* 3.7* 1.7   < > = values in this interval not displayed.    Liver Function Tests: Recent Labs  Lab 01/21/20 0437 01/21/20 0437 01/22/20 0437 01/23/20 0520 01/24/20 0202 01/24/20 0952 01/25/20 0445  AST 61*  --  48* 36  --  59* 118*  ALT 107*  --  83* 60*  --  55* 76*  ALKPHOS 72  --  76 98  --  87 92  BILITOT 0.7  --  1.1 1.1  --  1.1 0.7  PROT 4.1*  --  4.3* 4.7*  --  4.6* 4.6*  ALBUMIN 1.6*   < > 1.5* 1.5* 1.3* 1.3* 1.3*   < > = values in this interval not displayed.   No results for input(s): LIPASE, AMYLASE in the last 168 hours. Recent Labs  Lab 01/22/20 1028 01/25/20 0445  AMMONIA 44* 37*    ABG    Component Value Date/Time   PHART 7.213 (L) 01/24/2020 0602   PCO2ART 52.6 (H) 01/24/2020 0602    PO2ART 258.0 (H) 01/24/2020 0602   HCO3 21.2 01/24/2020 0602   TCO2 23 01/24/2020 0602   ACIDBASEDEF 7.0 (H) 01/24/2020 0602   O2SAT 100.0 01/24/2020 0602     Coagulation Profile: No results for input(s): INR, PROTIME in the last 168 hours.  Cardiac Enzymes: No results for input(s): CKTOTAL, CKMB, CKMBINDEX, TROPONINI in the last 168 hours.  HbA1C: Hgb A1c MFr Bld  Date/Time Value Ref Range Status  01/14/2020 09:40 PM 5.4 4.8 - 5.6 % Final    Comment:    (NOTE)         Prediabetes: 5.7 - 6.4         Diabetes: >6.4         Glycemic control for adults with diabetes: <7.0     CBG: Recent Labs  Lab 01/24/20 1210 01/24/20 1550 01/24/20 2036 01/25/20 0037 01/25/20 0438  GLUCAP 133* 177* 220* 221* 242*    Review of Systems:   No new ROS today.  Past Medical History  She,  has a past medical history of Adhesive capsulitis of left shoulder (06/06/2016), Alcohol-induced chronic pancreatitis ([facility]), Brittle bone disease, Chronic pain syndrome, COPD (chronic obstructive pulmonary disease) ([facility]), Hearing loss, right (07/08/2016), Hepatitis C, High cholesterol, Hypertension, Hypopotassemia, Hypothyroidism (07/08/2016), Prediabetes, SSS (sick sinus syndrome) ([facility]) (09/06/2015), Thyroid disease, and Vitamin D deficiency.   Surgical History    Past Surgical History:  Procedure Laterality Date  . ABDOMINAL HYSTERECTOMY    . APPENDECTOMY    . ERCP     multiple with stents placed   . ESOPHAGOGASTRODUODENOSCOPY N/A 01/17/2020   Procedure: ESOPHAGOGASTRODUODENOSCOPY (EGD);  Surgeon: Lemar Lofty.,  MD;  Location: [facility] ENDOSCOPY;  Service: Gastroenterology;  Laterality: N/A;  . ESOPHAGOGASTRODUODENOSCOPY (EGD) WITH PROPOFOL N/A 01/14/2020   Procedure: ESOPHAGOGASTRODUODENOSCOPY (EGD) WITH PROPOFOL;  Surgeon: Benancio Deeds, MD;  Location: [facility] ENDOSCOPY;  Service: Gastroenterology;  Laterality: N/A;  . FOREIGN BODY REMOVAL  01/17/2020   Procedure: FOREIGN BODY REMOVAL;  Surgeon:  Meridee Score Netty Starring., MD;  Location: [facility] ENDOSCOPY;  Service: Gastroenterology;;  gastric lavage  . IR ANGIOGRAM SELECTIVE EACH ADDITIONAL VESSEL  01/15/2020  . IR ANGIOGRAM VISCERAL SELECTIVE  01/15/2020  . IR EMBO ART  VEN HEMORR LYMPH EXTRAV  INC GUIDE ROADMAPPING  01/15/2020  . IR US GUIDE VASC ACCESS RIGHT  01/15/2020  . PACEMAKER IMPLANT    . TONSILLECTOMY       Social History   reports that she has been smoking cigarettes. She has never used smokeless tobacco. She reports previous alcohol use. She reports previous drug use.   Family History   Her family history includes Emphysema in her mother; Heart attack in her brother; Heart block in her father; Lung cancer in her mother; Thyroid disease in her daughter, granddaughter, and mother.   Allergies Allergies  Allergen Reactions  . Buprenorphine Nausea Only  . Aspirin Nausea Only    GI upset GI upset      Home Medications  Prior to Admission medications   Medication Sig Start Date End Date Taking? Authorizing Provider  acetaminophen (TYLENOL) 500 MG tablet Take 2 tablets by mouth every 8 (eight) hours as needed. 11/01/19   [provider]  albuterol (VENTOLIN HFA) 108 (90 Base) MCG/ACT inhaler Inhale 2 puffs into the lungs every 4 (four) hours as needed. 11/24/18   [provider]  amLODipine (NORVASC) 5 MG tablet Take 1 tablet by mouth daily. 09/22/19   [provider]  baclofen (LIORESAL) 10 MG tablet Take 1 tablet by mouth daily as needed. 11/11/19   [provider]  ergocalciferol (VITAMIN D2) 1.25 MG (50000 UT) capsule Take 1 capsule by mouth once a week. 11/18/19   [provider]  fenofibrate (TRICOR) 48 MG tablet Take 1 tablet by mouth daily. 05/13/18   [provider]  gabapentin (NEURONTIN) 300 MG capsule Take 1 capsule by mouth 3 (three) times daily. 11/11/19 11/10/20  [provider]  levothyroxine (SYNTHROID) 50 MCG tablet Take 50 mcg by mouth daily before  breakfast.    [provider]  Melatonin 5 MG CAPS Take 2 capsules by mouth at bedtime.    [provider]  Oxycodone HCl 10 MG TABS Take 10 mg by mouth every 6 (six) hours as needed for pain. 01/12/20   [provider]  Pancrelipase, Lip-Prot-Amyl, (CREON) 24000-76000 units CPEP Take 1 capsule by mouth 3 (three) times daily. 08/18/19   [provider]  pantoprazole (PROTONIX) 40 MG tablet Take 40 mg by mouth daily.    [provider]  polyethylene glycol powder (GLYCOLAX/MIRALAX) 17 GM/SCOOP powder Take 17 g by mouth as needed.    [provider]  senna (SENOKOT) 8.6 MG TABS tablet Take 1 tablet by mouth as needed for mild constipation.    [provider]  Tiotropium Bromide Monohydrate (SPIRIVA RESPIMAT) 1.25 MCG/ACT AERS Inhale 2 puffs into the lungs daily. 11/15/19 11/14/20  [provider]     Critical care time:     Dellia Cloud, D.O. [facility] Internal Medicine, PGY-1 Pager: [phone], Phone: [phone] Date 01/25/2020 Time 1:41 PM

## 2020-01-25 NOTE — Progress Notes (Signed)
Inpatient Diabetes Program Recommendations  AACE/ADA: New Consensus Statement on Inpatient Glycemic Control  Target Ranges:  Prepandial:   less than 140 mg/dL      Peak postprandial:   less than 180 mg/dL (1-2 hours)      Critically ill patients:  140 - 180 mg/dL  Results for RODINA, PINALES (MRN [id number]) as of 01/25/2020 10:17  Ref. Range 01/24/2020 07:53 01/24/2020 12:10 01/24/2020 15:50 01/24/2020 20:36 01/25/2020 00:37 01/25/2020 04:38 01/25/2020 08:14  Glucose-Capillary Latest Ref Range: 70 - 99 mg/dL 685 (H) 992 (H) 341 (H) 220 (H) 221 (H) 242 (H) 220 (H)    Review of Glycemic Control  Diabetes history: Prediabetes Outpatient Diabetes medications: None Current orders for Inpatient glycemic control: Novolog 0-6 units Q4H; Solucortef 50 mg Q6H, Vital @ 40 ml/hr  Inpatient Diabetes Program Recommendations:    Insulin-Tube Feeding Coverage: If steroids are continued, please consider ordering Novolog 3 units Q4H for tube feeding coverage. If tube feeding is stopped or held then Novolog tube feeding coverage should also be stopped or held.  Thanks, Orlando Penner, RN, MSN, CDE Diabetes Coordinator Inpatient Diabetes Program [phone] (Team Pager from 8am to 5pm)

## 2020-01-25 NOTE — Consult Note (Addendum)
Consultation Note Date: 01/25/2020   Patient Name: Natasha Jacobson  DOB: 1951/03/16  MRN: [id number]  Age / Sex: 69 y.o., female  PCP: Concepcion Elk, MD Referring Physician: Kipp Brood, MD  Reason for Consultation: Establishing goals of care  HPI/Patient Profile: 69 y.o. female  with past medical history of COPD, chronic Hep C, chronic pancreatitis, anxiety, chronic pain, pancreatic gastrostomy stent exchanged on 4/8  admitted on 01/13/2020 with hematemesis, red bloody bowel movements, abdominal pain, hypotension and low Hgb. Clinically worsened on 4/10 requiring intubation. 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 Urine + e. Faecalis. Evidence of possible renal infarct on CT scan. She was extubated 4/11, reintubated 4/12, extubated 4/16, and reintubated 4/19. Now with pneumonia and septic shock requiring vasopressors. Hypoalbumin at 1.3. Transaminases at creatinine are both trending up. She has remained encephalopathic during entire admission. Palliative medicine consulted for goals of care.   Clinical Assessment and Goals of Care:  I have reviewed medical records including EPIC notes, labs and imaging, received report from Dr. Marianna Payment, examined the patient and met at bedside with patient's daughter, Natasha Jacobson to discuss diagnosis prognosis, [place], EOL wishes, disposition and options.  I introduced Palliative Medicine as specialized medical care for people living with serious illness. It focuses on providing relief from the symptoms and stress of a serious illness.   We discussed a brief life review of the patient. 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 Prior to admission she was living independently at home alone. Able to complete all ADL's on her  own. Struggling with chronic pain due to her chronic pancreatitis. She was planning on having her pancreas removed in six months hoping this would improve her pain. She enjoys being outdoors, interactive with wildlife. Natasha Jacobson describes her as very independent and opinionated.   We discussed her current illness and what it means in the larger context of her on-going co-morbidities.  Natural disease trajectory and expectations at EOL were discussed. Natasha Jacobson notes that patient has advanced directives and has completed paperwork stating she would want her body donated to a university for research in the event that she were to die.   I attempted to elicit values and goals of care important to the patient. Being strong and able to live independently are very important to patient. We discussed patient's very tenuous state and the fact that even a minimal event will affect her status greatly.   When asked what patient would choose if providers felt they would not be able to get her back to living independently (choosing between continued aggressive care vs transition to comfort measures and allowing for natural death)- Natasha Jacobson stated she knows the answer but can't say it out loud right now.    Advanced directives, concepts specific to code status, artifical feeding and hydration, and rehospitalization were considered and discussed. Natasha Jacobson notes that Sherina would not want to be resuscitated in  the event of cardiac arrest, she would not want to live long term being kept alive artificially. Aasiya would want care that was going to return her to a meaningful functional state, living independently at home- would not want to be permanently debilitated and dependent for care.   Questions and concerns were addressed.  The family was encouraged to call with questions or concerns.   Primary Decision Maker NEXT OF KIN- daughter- Natasha Jacobson    SUMMARY OF RECOMMENDATIONS -DNR -Recommend continued open discussion with  patient's daughter regarding prognosis for recovery to independent functional state (please see above discussion) -Natasha Jacobson is driving to [place] in order to gather some of her personal belongings and patient's advanced directive paperwork as well as the donor paperwork that has been completed -PMT will continue to follow and discuss goals of care with Natasha Jacobson    Code Status/Advance Care Planning:  DNR  Additional Recommendations (Limitations, Scope, Preferences):  Full Scope Treatment  Prognosis:    Unable to determine  Discharge Planning: To Be Determined  Primary Diagnoses: Present on Admission: . Pancreatitis, acute . Essential hypertension . COPD (chronic obstructive pulmonary disease) ([place]) . Hypothyroidism . Acute pancreatitis   I have reviewed the medical record, interviewed the patient and family, and examined the patient. The following aspects are pertinent.  Past Medical History:  Diagnosis Date  . 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 Alcohol-induced chronic pancreatitis ([place])   . Brittle bone disease   . Chronic pain syndrome   . COPD (chronic obstructive pulmonary disease) ([place])   . Hearing loss, right 07/08/2016  . Hepatitis C    Eradicated with antiviral treatment per patient  . High cholesterol   . Hypertension   . Hypopotassemia   . Hypothyroidism 07/08/2016  . Prediabetes   . SSS (sick sinus syndrome) ([place]) 09/06/2015  . Thyroid disease   . Vitamin D deficiency    Social History   Socioeconomic History  . Marital status: Widowed    Spouse name: Not on file  . Number of children: Not on file  . Years of education: Not on file  . Highest education level: Not on file  Occupational History  . Not on file  Tobacco Use  . Smoking status: Current Every Day Smoker    Types: Cigarettes  . Smokeless tobacco: Never Used  Substance and Sexual Activity  . Alcohol use: Not Currently  . Drug use: Not  Currently  . Sexual activity: Not on file  Other Topics Concern  . Not on file  Social History Narrative   Widowed 1 daughter daughter lives in [place] through [place] survivor pension and Social Security survivor benefits   Smoker has not been able to quit   No alcohol since April 2013   No drug use no other tobacco   Social Determinants of Radio broadcast assistant Strain:   . Difficulty of Paying Living Expenses:   Food Insecurity:   . Worried About Charity fundraiser in the Last Year:   . Arboriculturist in the Last Year:   Transportation Needs:   . Film/video editor (Medical):   Marland Kitchen Lack of Transportation (Non-Medical):   Physical Activity:   . Days of Exercise per Week:   . Minutes of Exercise per Session:   Stress:   . Feeling of Stress :   Social Connections:   . Frequency of Communication with Friends and Family:   .  Frequency of Social Gatherings with Friends and Family:   . Attends Religious Services:   . Active Member of Clubs or Organizations:   . Attends Archivist Meetings:   Marland Kitchen Marital Status:    Family History  Problem Relation Age of Onset  . Lung cancer Mother   . Emphysema Mother   . Thyroid disease Mother   . Heart block Father   . Heart attack Brother   . Thyroid disease Daughter   . Thyroid disease Granddaughter    Scheduled Meds: . sodium chloride   Intravenous Once  . chlorhexidine gluconate (MEDLINE KIT)  15 mL Mouth Rinse BID  . Chlorhexidine Gluconate Cloth  6 each Topical Daily  . feeding supplement (PRO-STAT SUGAR FREE 64)  30 mL Per Tube Daily  . fentaNYL (SUBLIMAZE) injection  25 mcg Intravenous Once  . free water  300 mL Per Tube Q4H  . insulin aspart  0-6 Units Subcutaneous Q4H  . levothyroxine  50 mcg Per Tube Q0600  . mouth rinse  15 mL Mouth Rinse 10 times per day  . oxyCODONE  5 mg Per Tube Q6H  . pantoprazole (PROTONIX) IV  40 mg Intravenous Q12H  . sodium chloride flush  10-40 mL Intracatheter Q12H    Continuous Infusions: . sodium chloride 10 mL/hr at 01/25/20 1200  . ceFEPime (MAXIPIME) IV Stopped (01/24/20 2248)  . dexmedetomidine 1 mcg/kg/hr (01/25/20 1357)  . feeding supplement (VITAL 1.5 CAL) 40 mL/hr at 01/24/20 1312  . fentaNYL infusion INTRAVENOUS 200 mcg/hr (01/25/20 1206)  . linezolid (ZYVOX) IV 600 mg (01/25/20 1244)  . norepinephrine (LEVOPHED) Adult infusion 10 mcg/min (01/25/20 1200)  . sterile water 1,000 mL with sodium bicarbonate 100 mEq infusion    . vasopressin (PITRESSIN) infusion - *FOR SHOCK* 0.03 Units/min (01/25/20 1200)   PRN Meds:.albuterol, docusate, fentaNYL, hydrALAZINE, ipratropium-albuterol, polyethylene glycol, sodium chloride flush Medications Prior to Admission:  Prior to Admission medications   Medication Sig Start Date End Date Taking? Authorizing Provider  acetaminophen (TYLENOL) 500 MG tablet Take 2 tablets by mouth every 8 (eight) hours as needed. 11/01/19   [provider]  albuterol (VENTOLIN HFA) 108 (90 Base) MCG/ACT inhaler Inhale 2 puffs into the lungs every 4 (four) hours as needed. 11/24/18   [provider]  amLODipine (NORVASC) 5 MG tablet Take 1 tablet by mouth daily. 09/22/19   [provider]  baclofen (LIORESAL) 10 MG tablet Take 1 tablet by mouth daily as needed. 11/11/19   [provider]  ergocalciferol (VITAMIN D2) 1.25 MG (50000 UT) capsule Take 1 capsule by mouth once a week. 11/18/19   [provider]  fenofibrate (TRICOR) 48 MG tablet Take 1 tablet by mouth daily. 05/13/18   [provider]  gabapentin (NEURONTIN) 300 MG capsule Take 1 capsule by mouth 3 (three) times daily. 11/11/19 11/10/20  [provider]  levothyroxine (SYNTHROID) 50 MCG tablet Take 50 mcg by mouth daily before breakfast.    [provider]  Melatonin 5 MG CAPS Take 2 capsules by mouth at bedtime.    [provider]  Oxycodone HCl 10 MG TABS Take 10 mg by mouth every 6 (six) hours as  needed for pain. 01/12/20   [provider]  Pancrelipase, Lip-Prot-Amyl, (CREON) 24000-76000 units CPEP Take 1 capsule by mouth 3 (three) times daily. 08/18/19   [provider]  pantoprazole (PROTONIX) 40 MG tablet Take 40 mg by mouth daily.    [provider]  polyethylene glycol powder (  GLYCOLAX/MIRALAX) 17 GM/SCOOP powder Take 17 g by mouth as needed.    [provider]  senna (SENOKOT) 8.6 MG TABS tablet Take 1 tablet by mouth as needed for mild constipation.    [provider]  Tiotropium Bromide Monohydrate (SPIRIVA RESPIMAT) 1.25 MCG/ACT AERS Inhale 2 puffs into the lungs daily. 11/15/19 11/14/20  [provider]   Allergies  Allergen Reactions  . Buprenorphine Nausea Only  . Aspirin Nausea Only    GI upset GI upset    Review of Systems  Unable to perform ROS: Intubated    Physical Exam Vitals and nursing note reviewed.  Constitutional:      Appearance: She is ill-appearing and toxic-appearing.     Comments: Frail, cachectic  Cardiovascular:     Rate and Rhythm: Normal rate and regular rhythm.  Neurological:     Comments: Does not follow directions     Vital Signs: BP (!) 108/54   Pulse 65   Temp 100 F (37.8 C) (Oral)   Resp (!) 25   Ht '5\' 4"'  (1.626 m)   Wt 54 kg   SpO2 100%   BMI 20.43 kg/m  Pain Scale: CPOT POSS *See Group Information*: S-Acceptable,Sleep, easy to arouse Pain Score: 0-No pain   SpO2: SpO2: 100 % O2 Device:SpO2: 100 % O2 Flow Rate: .O2 Flow Rate (L/min): 6 L/min  IO: Intake/output summary:   Intake/Output Summary (Last 24 hours) at 01/25/2020 1523 Last data filed at 01/25/2020 1244 Gross per 24 hour  Intake 2841.78 ml  Output 975 ml  Net 1866.78 ml    LBM: Last BM Date: 01/25/20 Baseline Weight: Weight: 50.7 kg Most recent weight: Weight: 54 kg     Palliative Assessment/Data: PPS: 10%     Thank you for this consult. Palliative medicine will continue to follow and assist as  needed.   Time In: 1345 Time Out: 1600 Time Total: 135 minutes Prolonged services billed: yes Greater than 50%  of this time was spent counseling and coordinating care related to the above assessment and plan.  Signed by: Mariana Kaufman, AGNP-C Palliative Medicine    Please contact Palliative Medicine Team phone at [phone] for questions and concerns.  For individual provider: See Shea Evans

## 2020-01-25 NOTE — Progress Notes (Signed)
Nutrition Follow-up  DOCUMENTATION CODES:   Not applicable  INTERVENTION:  Change tube feeding to Vital AF @ 59m/hr (12069m  Tube feeding will provide 1440 kcal, 90 grams of protein, 97330mree water (2773m58mee water total with free water flushes as ordered by MD)  NUTRITION DIAGNOSIS:   Inadequate oral intake related to inability to eat as evidenced by NPO status.  Ongoing.  GOAL:   Patient will meet greater than or equal to 90% of their needs  Met with TF.  MONITOR:   Vent status, TF tolerance, Labs, Weight trends  REASON FOR ASSESSMENT:   Consult Enteral/tube feeding initiation and management  ASSESSMENT:   68 y62. female with history of chronic alcoholic pancreatitis s/p exchange [facility] pancreat[facility]gastrostomy [facility]. She started developing abdominal pain later in the evening which continued to worsen and 4/9 she began having hematemesis. She also had dark stools following which patient decided to come to the ED. Pain has been continuous epigastric in nature radiating to the back. CT abdomen/pelvis in the ED showed concern for acute on chronic pancreatitis and UA concerning for UTI. [facility] MD talked with GI and patient was [facility]/12 reintubated for encephalopathy and hypoxia  4/13 s/p EGD 4/16 pt extubated, Cortrak placed (post-pyloric), off pressors, renal function improved 4/19 pt re-intubated, attributed to HAP  Discussed pt with RN.   Current tube feeding regimen: Vital 1.5 @ 40ml67m(960ml)67mh 30ml P67mtat daily to provide 1540 kcal, 80 grams protein, 733ml fr82mater  Patient is currently intubated on ventilator support MV: 11.1 L/min Temp (24hrs), Avg:99.3 F (37.4 C), Min:98.3 F (36.8 C), Max:100 F (37.8 C)  UOP: 425ml x2462mrs I/O: +23,069.7ml since86mmit  Labs reviewed: Na 149 (H), CBGs 242-220-222 Medications reviewed and include: 300ml free 60mr  Q4H, Novolog Drips: Levophed, Pitressin    Diet Order:   Diet Order            Diet NPO time specified  Diet effective now              EDUCATION NEEDS:   Not appropriate for education at this time  Skin:  Skin Assessment: Skin Integrity Issues: Skin Integrity Issues:: Other (Comment) Other: MASD buttocks/groin  Last BM:  4/21 type 7 (via rectal tube)  Height:   Ht Readings from Last 1 Encounters:  01/14/20 '5\' 4"'  (1.626 m)    Weight:   Wt Readings from Last 1 Encounters:  01/25/20 54 kg    Ideal Body Weight:  54.5 kg  BMI:  Body mass index is 20.43 kg/m.  Estimated Nutritional Needs:   Kcal:  1471  Protein:  80-95 grams  Fluid:  >1.45 L/d    Grant Henkes AverLarkin InaDN RD pager number and weekend/on-call pager number located in [email or web address]

## 2020-01-25 NOTE — Progress Notes (Signed)
PT Cancellation Note  Patient Details Name: Brionna Romanek MRN: [id number] DOB: 1951-05-03   Cancelled Treatment:    Reason Eval/Treat Not Completed: Patient not medically ready.  Pt sedated and back on the vent. 01/25/2020  Jacinto Halim., PT Acute Rehabilitation Services [phone]  (pager) [phone]  (office)   Eliseo Gum Sharmain Lastra 01/25/2020, 4:41 PM

## 2020-01-26 DIAGNOSIS — J9601 Acute respiratory failure with hypoxia: Secondary | ICD-10-CM | POA: Diagnosis not present

## 2020-01-26 DIAGNOSIS — G9340 Encephalopathy, unspecified: Secondary | ICD-10-CM | POA: Diagnosis not present

## 2020-01-26 DIAGNOSIS — N179 Acute kidney failure, unspecified: Secondary | ICD-10-CM | POA: Diagnosis not present

## 2020-01-26 DIAGNOSIS — Z7189 Other specified counseling: Secondary | ICD-10-CM | POA: Diagnosis not present

## 2020-01-26 DIAGNOSIS — K852 Alcohol induced acute pancreatitis without necrosis or infection: Secondary | ICD-10-CM | POA: Diagnosis not present

## 2020-01-26 LAB — POCT I-STAT 7, (LYTES, BLD GAS, ICA,H+H)
Acid-base deficit: 6 mmol/L — ABNORMAL HIGH (ref 0.0–2.0)
Bicarbonate: 19.7 mmol/L — ABNORMAL LOW (ref 20.0–28.0)
Calcium, Ion: 1.22 mmol/L (ref 1.15–1.40)
HCT: 26 % — ABNORMAL LOW (ref 36.0–46.0)
Hemoglobin: 8.8 g/dL — ABNORMAL LOW (ref 12.0–15.0)
O2 Saturation: 97 %
Patient temperature: 97.5
Potassium: 3.5 mmol/L (ref 3.5–5.1)
Sodium: 143 mmol/L (ref 135–145)
TCO2: 21 mmol/L — ABNORMAL LOW (ref 22–32)
pCO2 arterial: 35.9 mmHg (ref 32.0–48.0)
pH, Arterial: 7.344 — ABNORMAL LOW (ref 7.350–7.450)
pO2, Arterial: 93 mmHg (ref 83.0–108.0)

## 2020-01-26 LAB — GLUCOSE, CAPILLARY
Glucose-Capillary: 129 mg/dL — ABNORMAL HIGH (ref 70–99)
Glucose-Capillary: 129 mg/dL — ABNORMAL HIGH (ref 70–99)
Glucose-Capillary: 129 mg/dL — ABNORMAL HIGH (ref 70–99)
Glucose-Capillary: 186 mg/dL — ABNORMAL HIGH (ref 70–99)
Glucose-Capillary: 200 mg/dL — ABNORMAL HIGH (ref 70–99)
Glucose-Capillary: 224 mg/dL — ABNORMAL HIGH (ref 70–99)

## 2020-01-26 LAB — COMPREHENSIVE METABOLIC PANEL
ALT: 85 U/L — ABNORMAL HIGH (ref 0–44)
AST: 96 U/L — ABNORMAL HIGH (ref 15–41)
Albumin: 1.2 g/dL — ABNORMAL LOW (ref 3.5–5.0)
Alkaline Phosphatase: 86 U/L (ref 38–126)
Anion gap: 9 (ref 5–15)
BUN: 105 mg/dL — ABNORMAL HIGH (ref 8–23)
CO2: 20 mmol/L — ABNORMAL LOW (ref 22–32)
Calcium: 7.4 mg/dL — ABNORMAL LOW (ref 8.9–10.3)
Chloride: 113 mmol/L — ABNORMAL HIGH (ref 98–111)
Creatinine, Ser: 2.48 mg/dL — ABNORMAL HIGH (ref 0.44–1.00)
GFR calc Af Amer: 23 mL/min — ABNORMAL LOW (ref >60)
GFR calc non Af Amer: 20 mL/min — ABNORMAL LOW (ref >60)
Glucose, Bld: 241 mg/dL — ABNORMAL HIGH (ref 70–99)
Potassium: 3.4 mmol/L — ABNORMAL LOW (ref 3.5–5.1)
Sodium: 142 mmol/L (ref 135–145)
Total Bilirubin: 0.8 mg/dL (ref 0.3–1.2)
Total Protein: 4.3 g/dL — ABNORMAL LOW (ref 6.5–8.1)

## 2020-01-26 LAB — CBC
HCT: 30.3 % — ABNORMAL LOW (ref 36.0–46.0)
Hemoglobin: 9.3 g/dL — ABNORMAL LOW (ref 12.0–15.0)
MCH: 30.5 pg (ref 26.0–34.0)
MCHC: 30.7 g/dL (ref 30.0–36.0)
MCV: 99.3 fL (ref 80.0–100.0)
Platelets: 306 10*3/uL (ref 150–400)
RBC: 3.05 MIL/uL — ABNORMAL LOW (ref 3.87–5.11)
RDW: 17.5 % — ABNORMAL HIGH (ref 11.5–15.5)
WBC: 33 10*3/uL — ABNORMAL HIGH (ref 4.0–10.5)
nRBC: 1.5 % — ABNORMAL HIGH (ref 0.0–0.2)

## 2020-01-26 LAB — CULTURE, RESPIRATORY W GRAM STAIN

## 2020-01-26 LAB — MAGNESIUM: Magnesium: 2.3 mg/dL (ref 1.7–2.4)

## 2020-01-26 MED ORDER — FREE WATER
200.0000 mL | Status: DC
  Administered 2020-01-26 – 2020-01-28 (×12): 200 mL

## 2020-01-26 NOTE — Progress Notes (Signed)
NAME:Natasha Jacobson, XAJ:[id number], DOB:01/14/1951, LOS:10 ADMISSION DATE:01/13/2020, CONSULTATION DATE: 01/14/20 REFERRING MD: Adela Lank, CHIEF COMPLAINT:AMS  Brief History   Natasha Jacobson is a 69 y.o. 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   History of present illness   69 year old woman with a hx of alcoholic pancreatitis s/p recent exchange of pancreaticogastrostomy stent at [facility] (4/8) who presented to the ED on 4/9 with hematemesis. On 4/10 she developed hematochezia, encephalopathy, and hypotension. She was transferred to the ICU and taken for emergent EGD that illustrated active upper GI hemorrhage with inadequate visualization for source control. 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 She was subsequently take for selective splenic arteriogram and coiled embolization of the pseudoaneurysm (4/10).  Patient with worsening hypoxic respiratory failure on 4/19withworsening work of breathing. Minimally responsive and hypotensive. Brought to ICU for further workup and eval.  Past Medical History   Past Medical History:  Diagnosis Date  . 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 Alcohol-induced chronic pancreatitis (HCC)   . Brittle bone disease   . Chronic pain syndrome   . COPD (chronic obstructive pulmonary disease) (HCC)   . Hearing loss, right 07/08/2016  . Hepatitis C    Eradicated with antiviral treatment per patient  . High cholesterol   . Hypertension   .  Hypopotassemia   . Hypothyroidism 07/08/2016  . Prediabetes   . SSS (sick sinus syndrome) (HCC) 09/06/2015  . Thyroid disease   . Vitamin D deficiency      Significant Hospital Events   4/9 admitted; mass transfusion protocol 4/10 transfer to ICU; EGD, IR embolization 4/11 extubated 4/12 white count remains elevated at 39k, LFTs significantly elevated; continues to have dark red stools; hgb stable; minimal responsiveness; reintubated for increased work of breathing 4/13 LFTs improving. hgb stable. More responsive this morning. Minimal improvement in respiratory status. Increased BRBPR>>EGD 4/14 LFTs, renal function improving. More encephalopathic this morning. hgb stable. 4/15continues to be severely encephalopathic>head CT neg; progressive hypernatremia 148>151 4/16 improved mental status, improved renal function; extubated; off pressors 4/17 remains encephalopathic 4/18 D/c to floor 4/19 Re-intubated and re-admitted to ICU  Consults:  none  Procedures:  4/10 Intubation>>4/11 4/10 central line 4/12 ETT- 4/16 4/19 Intubated>>  Significant Diagnostic Tests:  CT A/P 01/13/20>>Mild peripancreatic haziness may represent acute on chronicpancreatitis. A pancreatic stent with pigtail ends in the second portion of the duodenum and in the body of the stomach noted. No peripancreatic fluid collection or pseudocyst. Bilateral renal parenchyma wedge-shaped infarcts, new since the prior CT.   4/9 CTA a/p>>53mm pseudoaneurysm over a pancreatic branch of the celiac. Evidence of acute hemorrhage.  -Patient bilateral renal arteries with focal narrowing of the proximal 1cm of right renal artery. -interval distension of the stomach/duodenum likely 2/2 hemorrhage. Dilated proximal small bowel loops. Possible penumatosis involving descending colon  -worsening patchy bilateral low attenuation areas over the renal cortex which may be due to infarction -worsening free peritoneal fluid  4/13  CXR>>bibasilar atelectasis and small left effusion  4/13 EGD>>significant amount of clotted blood  present in stomach. Superficial gastric ulcers. Diffuse moderate mucosal changes characterized by congestion, discoloration and decreased vascular pattern in the fundus and gastric body with questionable ischemia. No bleeding source apparent  4/15 CXR>possible small left sided effusion. Bibasilar atelectasis  4/15 CT head:No acute intracranial abnormality. Acute on chronic sinusitis.  4/19 CXR>Old granulomatous disease. Bibasilar scarring or atelectasis. Possible small bilateral Effusions. Emphysema.  4/20 CXR>Endotracheal tube 3.7 cm above the carina. Airspace disease worsening throughout the left lung, most confluentin the left lung base concerning for pneumonia. Cardiomegaly, COPD.  4/20 XR Abdomen> Dobbhoff tube with the tip projecting over the jejunum.  Micro Data:  4/9 Urine culture>> ++ e.faecalis 4/9 COVID Neg 4/19 BC> pending 4/19 Resp culture> GPC chains/S. Aureus  4/19 Urine culture> pending   Antimicrobials:  4/9 Zosyn x 1 4/10 Ceftriaxone >> 4/12 Ampicillin 4/12>>4/17 Cefepime 4/19>4/9 Zosyn x 1 4/10 Ceftriaxone >> 4/12 Ampicillin 4/12>>4/17  Vancomycin 4/19>4/20  Cefepime 4/19> Linezolid 4/21>  Interim history/subjective:  Patient still minimally responsive on sedation. She is having significant upper extremity edema. Otherwise does not appear to be in distress. Bilateral ventilation breath sounds.  Objective   Blood pressure 111/62, pulse 65, temperature (!) 97.5 F (36.4 C), temperature source Axillary, resp. rate (!) 25, height  (1.626 m), weight 54 kg, SpO2 100 %. CVP:  [4 mmHg-5 mmHg] 5 mmHg  Vent Mode: PRVC FiO2 (%):  [40 %] 40 % Set Rate:  [25 bmp-32 bmp] 25 bmp Vt Set:  [440 mL] 440 mL PEEP:  [5 cmH20] 5 cmH20 Plateau Pressure:  [14 cmH20-17 cmH20] 14 cmH20   Intake/Output Summary (Last 24 hours) at 01/26/2020 0512 Last data filed at  01/26/2020 0200 Gross per 24 hour  Intake 3717.49 ml  Output 680 ml  Net 3037.49 ml   Filed Weights   01/23/20 0405 01/24/20 0100 01/25/20 0500  Weight: 57.7 kg 53.6 kg 54 kg    Examination: Constitutional: No distress.  HENT:  Head: Normocephalic and atraumatic.  Cardiovascular: Normal rate and intact distal pulses.  No murmur heard. Pulmonary/Chest: She is in respiratory distress.  On ventilator   Abdominal: Soft. She exhibits no distension.  Musculoskeletal:        General: Edema present. Normal range of motion.  Neurological:  Sedated   Skin: Skin is warm. She is diaphoretic.   Resolved Hospital Problem list   -Hemorrhagic shock 2/2 bleeding 7mm pseudoaneurysm and recent instrumentation s/p EGD and coil embolization by IR 4/10 -Shock resolved 4/16 -Acute respiratory failure requiring MV resolved 4/16 -E. Faecalis UTI-completed 5d course of ampicillin 4/16 -Acute blood loss anemia resolved 4/17 -Thrombocytopenia resolved 4/17  Assessment & Plan:  Acute hypoxemic respiratory failurepresumed due to HAP Patient intubated for acute hypoxic/respiratory failure. CXR shows lower lung consolidation consistent with pneumonia. Respiratory cultures growing staph aureus. Switched to Linezolid for antibiotic coverage.  - Begin SBT when possible. - Continue Bronchial hygiene - RASS -1 to -2, start Oxy, precedex and fentanyl and titrate to achieve RASS goals.  - Continue linezolid and cefepime   Leukocytosis Shock2/2 to possible sepsis Patient continues to have signs and symptoms consistent with septic shock. She had a fever overnight as high as 101.32F, elevated WBC of 36.8 on steroids, and requiring vaso and levo to maintain MAPS >65. Switched patient to Linezolid due to sputum cultures growing GPC/staph aureus. She appears to be extravascular vascularly volume overloaded.  - Continue pressors to keep MAPs > 65 in the setting of possible sepsis  - Continue q4 hour  CVPs -  Continuebroad-spectrum AB with linezolid and Cefepime - Resp culture growing Staph, waiting on specificities   AKI Patient's Cr improving today at 2.48 with 680cc urine output yesterday.  has worsening Cr at 3.24 . Appears extra-vascularlly volume overloaded. FeNa showed pre-renal azotemia with mild nongap acidosis. She was given a Bicarb yesterday with free water - FeNa pending  - Avoid nephrotoxic medication - Replete electrolyte when possible - Gentle hydration with Bicarb infusion yesterday.  GI bleed>splenic pseudoaneurysm>recent instrumentation for pancreatic stents/p embolization. Stable Hgb of 9.3 -continue to monitor hemoglobin.   Hypernatremia: Patient has a Na of 142 improved from yesterday at 149. Patient still has high CL. Likely due to volume resuscitation. Gave bicarb last night for non gap metabolic acidosis, but only received 400 cc. -Continue free water boluses while getting tube feeds - Consider restarting gentle hydration with bicarb and free water.   Acutemetabolicencephalopathy. - Continue fentanyl, precedex and oxy for sedation. - Rass of 0 to -1 goal.  Protein-caloric malnutrition. NPO due to intubation. Consider restartingpost-pylorictube feeds at 25-50% caloric goal and increase to 100% over next 3-7 days.   Hypothyroidism. Continue synthroid.  Best practice:  Diet: NPO>> tube feeds Pain/Anxiety/Delirium protocol (if indicated): precedex, oxy, fentanyl  VAP protocol (if indicated):VAP protocol initiated  DVT prophylaxis: SCDs GI prophylaxis: PPI  Glucose control: SSI Mobility: BR Code Status: Full - palliative consulted.  Family Communication:Contacted today. Disposition: ICU  Labs   CBC: Recent Labs  Lab 01/22/20 0437 01/22/20 1028 01/23/20 0520 01/24/20 0113 01/24/20 0202 01/24/20 0341 01/24/20 [id number] 01/24/20 1219 01/24/20 1759 01/25/20 0445 01/25/20 0816  WBC 24.9*  --  24.3*  --  31.8*  --  35.6*  --   --  36.8*  --    NEUTROABS  --   --   --   --   --   --   --   --   --  32.8*  --   HGB 11.8*  --  11.8*   < > 10.8*   < > 10.8* 9.9* 10.0* 9.7* 9.5*  HCT 35.2*   < > 35.1*   < > 34.7*   < > 36.0 33.0* 33.3* 31.9* 28.0*  MCV 91.9  --  92.4  --  99.1  --  100.6*  --   --  100.0  --   PLT 287  --  411*  --  424*  --  418*  --   --  396  --    < > = values in this interval not displayed.    Basic Metabolic Panel: Recent Labs  Lab 01/22/20 1813 01/22/20 1813 01/23/20 0520 01/24/20 0113 01/24/20 0202 01/24/20 0202 01/24/20 0341 01/24/20 0602 01/24/20 [id number] 01/25/20 0445 01/25/20 0816  NA 150*   < > 151*   < > 153*   < > 153* 153* 153* 149* 148*  K 3.3*   < > 2.9*   < > 4.3   < > 4.1 4.4 4.4 4.3 4.3  CL 117*  --  114*  --  121*  --   --   --  125* 121*  --   CO2 22  --  23  --  22  --   --   --  18* 18*  --   GLUCOSE 148*  --  148*  --  252*  --   --   --  144* 273*  --   BUN 94*  --  93*  --  98*  --   --   --  102* 118*  --   CREATININE 2.03*  --  2.02*  --  2.31*  --   --   --  2.71* 3.24*  --   CALCIUM 7.6*  --  7.7*  --  7.1*  --   --   --  6.9* 7.5*  --   MG  --   --  1.6*  --  2.2  --   --   --   --  2.4  --   PHOS  --   --   --   --  6.8*  --   --   --   --   --   --    < > = values in this interval not displayed.   GFR: Estimated Creatinine Clearance: 14.6 mL/min (A) (by C-G formula based on SCr of 3.24 mg/dL (H)). Recent Labs  Lab 01/20/20 0600 01/20/20 1321 01/21/20 0437 01/22/20 0437 01/23/20 0520 01/24/20 0202 01/24/20 0952 01/24/20 1514 01/25/20 0445  PROCALCITON  --  1.41 0.97  --   --  1.67  --   --   --   WBC   < >  --  27.0*   < > 24.3* 31.8* 35.6*  --  36.8*  LATICACIDVEN  --   --   --   --   --   --  2.2* 3.7* 1.7   < > = values in this interval not displayed.    Liver Function Tests: Recent Labs  Lab 01/21/20 0437 01/21/20 0437 01/22/20 0437 01/23/20 0520 01/24/20 0202 01/24/20 0952 01/25/20 0445  AST 61*  --  48* 36  --  59* 118*  ALT 107*  --  83* 60*   --  55* 76*  ALKPHOS 72  --  76 98  --  87 92  BILITOT 0.7  --  1.1 1.1  --  1.1 0.7  PROT 4.1*  --  4.3* 4.7*  --  4.6* 4.6*  ALBUMIN 1.6*   < > 1.5* 1.5* 1.3* 1.3* 1.3*   < > = values in this interval not displayed.   No results for input(s): LIPASE, AMYLASE in the last 168 hours. Recent Labs  Lab 01/22/20 1028 01/25/20 0445  AMMONIA 44* 37*    ABG    Component Value Date/Time   PHART 7.298 (L) 01/25/2020 0816   PCO2ART 35.8 01/25/2020 0816   PO2ART 88.0 01/25/2020 0816   HCO3 17.5 (L) 01/25/2020 0816   TCO2 19 (L) 01/25/2020 0816   ACIDBASEDEF 8.0 (H) 01/25/2020 0816   O2SAT 96.0 01/25/2020 0816     Coagulation Profile: No results for input(s): INR, PROTIME in the last 168 hours.  Cardiac Enzymes: No results for input(s): CKTOTAL, CKMB, CKMBINDEX, TROPONINI in the last 168 hours.  HbA1C: Hgb A1c MFr Bld  Date/Time Value Ref Range Status  01/14/2020 09:40 PM 5.4 4.8 - 5.6 % Final    Comment:    (NOTE)         Prediabetes: 5.7 - 6.4         Diabetes: >6.4         Glycemic control for adults with diabetes: <7.0     CBG: Recent Labs  Lab 01/25/20 1148 01/25/20 1642 01/25/20 2004 01/25/20 2339 01/26/20 0341  GLUCAP 222* 240* 221* 201* 224*    Review of Systems:   Unable to obtain due to ventilator and sedation.  Past Medical History  She,  has a past medical history of Adhesive capsulitis of left  shoulder (06/06/2016), Alcohol-induced chronic pancreatitis (HCC), Brittle bone disease, Chronic pain syndrome, COPD (chronic obstructive pulmonary disease) (HCC), Hearing loss, right (07/08/2016), Hepatitis C, High cholesterol, Hypertension, Hypopotassemia, Hypothyroidism (07/08/2016), Prediabetes, SSS (sick sinus syndrome) (HCC) (09/06/2015), Thyroid disease, and Vitamin D deficiency.   Surgical History    Past Surgical History:  Procedure Laterality Date  . ABDOMINAL HYSTERECTOMY    . APPENDECTOMY    . ERCP     multiple with stents placed   .  ESOPHAGOGASTRODUODENOSCOPY N/A 01/17/2020   Procedure: ESOPHAGOGASTRODUODENOSCOPY (EGD);  Surgeon: Lemar Lofty., MD;  Location: [facility] ENDOSCOPY;  Service: Gastroenterology;  Laterality: N/A;  . ESOPHAGOGASTRODUODENOSCOPY (EGD) WITH PROPOFOL N/A 01/14/2020   Procedure: ESOPHAGOGASTRODUODENOSCOPY (EGD) WITH PROPOFOL;  Surgeon: Benancio Deeds, MD;  Location: [facility] ENDOSCOPY;  Service: Gastroenterology;  Laterality: N/A;  . FOREIGN BODY REMOVAL  01/17/2020   Procedure: FOREIGN BODY REMOVAL;  Surgeon: Meridee Score Netty Starring., MD;  Location: [facility] ENDOSCOPY;  Service: Gastroenterology;;  gastric lavage  . IR ANGIOGRAM SELECTIVE EACH ADDITIONAL VESSEL  01/15/2020  . IR ANGIOGRAM VISCERAL SELECTIVE  01/15/2020  . IR EMBO ART  VEN HEMORR LYMPH EXTRAV  INC GUIDE ROADMAPPING  01/15/2020  . IR US GUIDE VASC ACCESS RIGHT  01/15/2020  . PACEMAKER IMPLANT    . TONSILLECTOMY       Social History   reports that she has been smoking cigarettes. She has never used smokeless tobacco. She reports previous alcohol use. She reports previous drug use.   Family History   Her family history includes Emphysema in her mother; Heart attack in her brother; Heart block in her father; Lung cancer in her mother; Thyroid disease in her daughter, granddaughter, and mother.   Allergies Allergies  Allergen Reactions  . Buprenorphine Nausea Only  . Aspirin Nausea Only    GI upset GI upset      Home Medications  Prior to Admission medications   Medication Sig Start Date End Date Taking? Authorizing Provider  acetaminophen (TYLENOL) 500 MG tablet Take 2 tablets by mouth every 8 (eight) hours as needed. 11/01/19   [provider]  albuterol (VENTOLIN HFA) 108 (90 Base) MCG/ACT inhaler Inhale 2 puffs into the lungs every 4 (four) hours as needed. 11/24/18   [provider]  amLODipine (NORVASC) 5 MG tablet Take 1 tablet by mouth daily. 09/22/19   [provider]  baclofen (LIORESAL) 10 MG tablet  Take 1 tablet by mouth daily as needed. 11/11/19   [provider]  ergocalciferol (VITAMIN D2) 1.25 MG (50000 UT) capsule Take 1 capsule by mouth once a week. 11/18/19   [provider]  fenofibrate (TRICOR) 48 MG tablet Take 1 tablet by mouth daily. 05/13/18   [provider]  gabapentin (NEURONTIN) 300 MG capsule Take 1 capsule by mouth 3 (three) times daily. 11/11/19 11/10/20  [provider]  levothyroxine (SYNTHROID) 50 MCG tablet Take 50 mcg by mouth daily before breakfast.    [provider]  Melatonin 5 MG CAPS Take 2 capsules by mouth at bedtime.    [provider]  Oxycodone HCl 10 MG TABS Take 10 mg by mouth every 6 (six) hours as needed for pain. 01/12/20   [provider]  Pancrelipase, Lip-Prot-Amyl, (CREON) 24000-76000 units CPEP Take 1 capsule by mouth 3 (three) times daily. 08/18/19   [provider]  pantoprazole (PROTONIX) 40 MG tablet Take 40 mg by mouth daily.    [provider]  polyethylene glycol powder (GLYCOLAX/MIRALAX) 17 GM/SCOOP powder Take  17 g by mouth as needed.    [provider]  senna (SENOKOT) 8.6 MG TABS tablet Take 1 tablet by mouth as needed for mild constipation.    [provider]  Tiotropium Bromide Monohydrate (SPIRIVA RESPIMAT) 1.25 MCG/ACT AERS Inhale 2 puffs into the lungs daily. 11/15/19 11/14/20  [provider]     Critical care time:     Marianna Payment, D.O. [facility], PGY-1 Pager: [phone], Phone: [phone] Date 01/26/2020 Time 7:28 AM

## 2020-01-26 NOTE — Progress Notes (Signed)
Daily Progress Note   Patient Name: Natasha Jacobson       Date: 01/26/2020 DOB: 05/11/1951  Age: 69 y.o. MRN#: [id number] Attending Physician: Kipp Brood, MD Primary Care Physician: Concepcion Elk, MD Admit Date: 01/13/2020  Reason for Consultation/Follow-up: Establishing goals of care  Subjective: Patient extubated, awake, alert. Hypophonic speech, asking for water. Not oriented or able to participate in Bode discussion.  Called daughter Natasha Jacobson. Updated her on Mom's status. Discussed that although good news that Mom is extubated- concerns remain for po intake, mental status, patient still requiring vasopressors, and worries regarding her history of being extubated and requiring reintubation soon after.  Natasha Jacobson is uncertain about reintubation for Mom if needed. She has been in discussion with patient's sister and wishes to call her again for advice.   ROS  Length of Stay: 13  Current Medications: Scheduled Meds:  . sodium chloride   Intravenous Once  . chlorhexidine gluconate (MEDLINE KIT)  15 mL Mouth Rinse BID  . Chlorhexidine Gluconate Cloth  6 each Topical Daily  . free water  200 mL Per Tube Q4H  . insulin aspart  0-6 Units Subcutaneous Q4H  . levothyroxine  50 mcg Per Tube Q0600  . mouth rinse  15 mL Mouth Rinse 10 times per day  . oxyCODONE  5 mg Per Tube Q6H  . pantoprazole (PROTONIX) IV  40 mg Intravenous Q12H  . sodium chloride flush  10-40 mL Intracatheter Q12H    Continuous Infusions: . sodium chloride 10 mL/hr at 01/26/20 0200  . ceFEPime (MAXIPIME) IV Stopped (01/25/20 2321)  . dexmedetomidine Stopped (01/26/20 1121)  . feeding supplement (VITAL AF 1.2 CAL) 1,000 mL (01/26/20 1233)  . norepinephrine (LEVOPHED) Adult infusion 2 mcg/min (01/26/20 1235)    PRN  Meds: albuterol, docusate, hydrALAZINE, ipratropium-albuterol, polyethylene glycol, sodium chloride flush  Physical Exam Vitals and nursing note reviewed.  Constitutional:      Comments: Frail, cachectic  Cardiovascular:     Rate and Rhythm: Normal rate and regular rhythm.  Pulmonary:     Effort: Pulmonary effort is normal.  Skin:    General: Skin is warm and dry.  Neurological:     Mental Status: She is disoriented.     Motor: Weakness present.             Vital Signs: BP Marland Kitchen)  93/51   Pulse 65   Temp (!) 97.4 F (36.3 C) (Axillary)   Resp 12   Ht 5' 4" (1.626 m)   Wt 53 kg   SpO2 100%   BMI 20.06 kg/m  SpO2: SpO2: 100 % O2 Device: O2 Device: Nasal Cannula O2 Flow Rate: O2 Flow Rate (L/min): 4 L/min  Intake/output summary:   Intake/Output Summary (Last 24 hours) at 01/26/2020 1343 Last data filed at 01/26/2020 1235 Gross per 24 hour  Intake 3083.91 ml  Output 765 ml  Net 2318.91 ml   LBM: Last BM Date: 01/25/20 Baseline Weight: Weight: 50.7 kg Most recent weight: Weight: 53 kg       Palliative Assessment/Data: PPS: 10%      Patient Active Problem List   Diagnosis Date Noted  . HAP (hospital-acquired pneumonia)   . Septic shock (HCC)   . Goals of care, counseling/discussion   . Palliative care by specialist   . DNR (do not resuscitate)   . Advanced care planning/counseling discussion   . Acute encephalopathy   . Hemorrhagic shock (HCC)   . AKI (acute kidney injury) (HCC)   . Anemia   . Essential hypertension 01/14/2020  . Acute pancreatitis 01/14/2020  . Gastrointestinal hemorrhage   . Acute respiratory failure (HCC)   . Pancreatitis, acute 01/13/2020  . Generalized anxiety disorder 12/20/2019  . 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 Chronic pain syndrome 08/02/2018  . Chronic hepatitis C without hepatic coma (HCC) 08/18/2017  . Hypothyroidism 07/08/2016  . Hearing loss, right 07/08/2016   . COPD (chronic obstructive pulmonary disease) (HCC) 09/06/2015  . SSS (sick sinus syndrome) (HCC) 09/06/2015    Palliative Care Assessment & Plan   Patient Profile:  68 y.o. female  with past medical history of COPD, chronic Hep C, chronic pancreatitis, anxiety, chronic pain, pancreatic gastrostomy stent exchanged on 4/8  admitted on 01/13/2020 with hematemesis, red bloody bowel movements, abdominal pain, hypotension and low Hgb. Clinically worsened on 4/10 requiring intubation. 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 Urine + e. Faecalis. Evidence of possible renal infarct on CT scan. She was extubated 4/11, reintubated 4/12, extubated 4/16, and reintubated 4/19. Now with pneumonia and septic shock requiring vasopressors. Hypoalbumin at 1.3. Transaminases at creatinine are both trending up. She has remained encephalopathic during entire admission. Palliative medicine consulted for goals of care.   Assessment/Recommendations/Plan  Continue current scope of care DNR Natasha Jacobson to discuss reintubation with aunt and call back by end of day with decision  Goals of Care and Additional Recommendations: Limitations on Scope of Treatment: Full Scope Treatment  Code Status: DNR  Prognosis:  Unable to determine  Discharge Planning: To Be Determined  Care plan was discussed with patient's daughter- Erica  Thank you for allowing the Palliative Medicine Team to assist in the care of this patient.   Time In: 1315 Time Out: 1350 Total Time 35 minutes Prolonged Time Billed no      Greater than 50%  of this time was spent counseling and coordinating care related to the above assessment and plan.   , AGNP-C Palliative Medicine   Please contact Palliative Medicine Team phone at [phone] for questions and concerns.        

## 2020-01-26 NOTE — Procedures (Signed)
Extubation Procedure Note  Patient Details:   Name: Astria Jordahl DOB: May 31, 1951 MRN: [id number]   Airway Documentation:    Vent end date: 01/26/20 Vent end time: 1205   Evaluation  O2 sats: stable throughout Complications: No apparent complications Patient did tolerate procedure well. Bilateral Breath Sounds: Clear   Patient extubated per MD order & placed on 4L [place]. Patient was able to say her name, but has a weak cough.   Jacqulynn Cadet 01/26/2020, 12:12 PM

## 2020-01-26 NOTE — Progress Notes (Signed)
Addendum to previous note:   Natasha Jacobson called back this evening. She is unsure if Natasha Jacobson would want to be intubated again. At this point- she thinks that she probably would want reintubation again if Natasha respiratory status declined.  We discussed that if patient required reintubation- that providers may recommend a tracheostomy. Natasha Jacobson asked if a tracheostomy is permanent. Told Natasha that tracheostomy is not always permanent- however, it would put Natasha Jacobson at risk of needing long term nursing care in a facility.  For now- Natasha Jacobson wishes for DNR in the event of cardiac and respiratory arrest- however, if there is changes in mental status or respiratory status begins to decline she would want Natasha Jacobson reintubated to prevent arrest.   PMT will continue to follow and discuss goals of care.   [name], AGNP-C Palliative Medicine  Please call Palliative Medicine team phone with any questions [phone]. For individual providers please see AMION.  No charge

## 2020-01-26 NOTE — Progress Notes (Signed)
Fentanyl 100 cc wasted in stericycle. Witnessed with Samule Ohm.

## 2020-01-26 NOTE — Progress Notes (Signed)
Physical Therapy Treatment Patient Details Name: Natasha Jacobson MRN: [id number] DOB: [birth year] Today's Date: 01/26/2020    History of Present Illness Pt is a 69 yo female admitted with hemorrhagic shock 2/2 bleeding 85mm pseudoaneurysm and recent instrumentation s/p EGD and coil embolization by IR 4/10. Course also significant for UTI, acute on chronic pancreatitis, and ETT 4/10-4/11; reintubated for encephalopathy and hypoxia 4/12-4/16. PMH also includes: COPD, hearing loss, HTN, thyroid disease.  Reintubated 4/19 and extubated again 4/23.    PT Comments    Pt lethargic, but able to follow simple commands with supplementary cues.  Pt expressed general pain, but overall good.  Emphasis on transition to EOB and sitting balance/tolerance at EOB.  Plan was for standing and transfer to the chair, but pt declined.    Follow Up Recommendations  SNF     Equipment Recommendations  Other (comment)    Recommendations for Other Services       Precautions / Restrictions Precautions Precautions: Fall Precaution Comments: cortrak, watch     Mobility  Bed Mobility Overal bed mobility: Needs Assistance Bed Mobility: Supine to Sit;Sit to Supine     Supine to sit: Mod assist;+2 for physical assistance Sit to supine: Max assist;+2 for physical assistance   General bed mobility comments: up via R elbow with trucal assist to bring into midline and mod assist to scoot to EOB  Transfers                 General transfer comment: pt deferred  Ambulation/Gait                 Stairs             Wheelchair Mobility    Modified Rankin (Stroke Patients Only)       Balance Overall balance assessment: Needs assistance Sitting-balance support: No upper extremity supported;Single extremity supported Sitting balance-Leahy Scale: Fair Sitting balance - Comments: sat at EOB x>=10 min mostly slumped, but able with cuing to sit upright for short periods.                                     Cognition Arousal/Alertness: Awake/alert Behavior During Therapy: Flat affect Overall Cognitive Status: Impaired/Different from baseline                         Following Commands: Follows one step commands inconsistently;Follows one step commands with increased time   Awareness: Intellectual          Exercises Other Exercises Other Exercises: bil LE warm up ROM exercise.    General Comments General comments (skin integrity, edema, etc.): during mobility HR in the 70's, BP 93/63, 100% on 4L [place]      Pertinent Vitals/Pain Pain Assessment: Faces Faces Pain Scale: Hurts little more Pain Location: generalized Pain Descriptors / Indicators: Guarding;Grimacing;Discomfort Pain Intervention(s): Monitored during session    Home Living                      Prior Function            PT Goals (current goals can now be found in the care plan section) Acute Rehab PT Goals Patient Stated Goal: none stated today  PT Goal Formulation: With family Time For Goal Achievement: 02/04/20 Potential to Achieve Goals: Fair Progress towards PT goals: Progressing toward goals    Frequency  Min 3X/week      PT Plan Current plan remains appropriate    Co-evaluation              AM-PAC PT "6 Clicks" Mobility   Outcome Measure  Help needed turning from your back to your side while in a flat bed without using bedrails?: A Lot Help needed moving from lying on your back to sitting on the side of a flat bed without using bedrails?: A Lot Help needed moving to and from a bed to a chair (including a wheelchair)?: Total Help needed standing up from a chair using your arms (e.g., wheelchair or bedside chair)?: Total Help needed to walk in hospital room?: Total Help needed climbing 3-5 steps with a railing? : Total 6 Click Score: 8    End of Session   Activity Tolerance: Patient tolerated treatment well Patient left: in bed;with call  bell/phone within reach Nurse Communication: Mobility status PT Visit Diagnosis: Other abnormalities of gait and mobility (R26.89);Muscle weakness (generalized) (M62.81);Difficulty in walking, not elsewhere classified (R26.2);Pain Pain - part of body: (general)     Time: 8022-3361 PT Time Calculation (min) (ACUTE ONLY): 25 min  Charges:  $Therapeutic Activity: 23-37 mins                     01/26/2020  Jacinto Halim., PT Acute Rehabilitation Services [phone]  (pager) [phone]  (office)   Eliseo Gum Stacey Maura 01/26/2020, 4:44 PM

## 2020-01-27 DIAGNOSIS — G9340 Encephalopathy, unspecified: Secondary | ICD-10-CM | POA: Diagnosis not present

## 2020-01-27 DIAGNOSIS — Z7189 Other specified counseling: Secondary | ICD-10-CM | POA: Diagnosis not present

## 2020-01-27 DIAGNOSIS — K852 Alcohol induced acute pancreatitis without necrosis or infection: Secondary | ICD-10-CM | POA: Diagnosis not present

## 2020-01-27 DIAGNOSIS — E43 Unspecified severe protein-calorie malnutrition: Secondary | ICD-10-CM | POA: Diagnosis present

## 2020-01-27 DIAGNOSIS — K859 Acute pancreatitis without necrosis or infection, unspecified: Secondary | ICD-10-CM | POA: Diagnosis not present

## 2020-01-27 LAB — CBC WITH DIFFERENTIAL/PLATELET
Abs Immature Granulocytes: 0 10*3/uL (ref 0.00–0.07)
Band Neutrophils: 2 %
Basophils Absolute: 0 10*3/uL (ref 0.0–0.1)
Basophils Relative: 0 %
Eosinophils Absolute: 0 10*3/uL (ref 0.0–0.5)
Eosinophils Relative: 0 %
HCT: 27.7 % — ABNORMAL LOW (ref 36.0–46.0)
Hemoglobin: 8.9 g/dL — ABNORMAL LOW (ref 12.0–15.0)
Lymphocytes Relative: 0 %
Lymphs Abs: 0 10*3/uL — ABNORMAL LOW (ref 0.7–4.0)
MCH: 30.6 pg (ref 26.0–34.0)
MCHC: 32.1 g/dL (ref 30.0–36.0)
MCV: 95.2 fL (ref 80.0–100.0)
Monocytes Absolute: 0 10*3/uL — ABNORMAL LOW (ref 0.1–1.0)
Monocytes Relative: 0 %
Neutro Abs: 41.8 10*3/uL — ABNORMAL HIGH (ref 1.7–7.7)
Neutrophils Relative %: 98 %
Platelets: 275 10*3/uL (ref 150–400)
RBC: 2.91 MIL/uL — ABNORMAL LOW (ref 3.87–5.11)
RDW: 17 % — ABNORMAL HIGH (ref 11.5–15.5)
WBC Morphology: INCREASED
WBC: 41.8 10*3/uL — ABNORMAL HIGH (ref 4.0–10.5)
nRBC: 2.1 % — ABNORMAL HIGH (ref 0.0–0.2)
nRBC: 4 /100 WBC — ABNORMAL HIGH

## 2020-01-27 LAB — BASIC METABOLIC PANEL
Anion gap: 9 (ref 5–15)
BUN: 82 mg/dL — ABNORMAL HIGH (ref 8–23)
CO2: 19 mmol/L — ABNORMAL LOW (ref 22–32)
Calcium: 7.2 mg/dL — ABNORMAL LOW (ref 8.9–10.3)
Chloride: 117 mmol/L — ABNORMAL HIGH (ref 98–111)
Creatinine, Ser: 1.98 mg/dL — ABNORMAL HIGH (ref 0.44–1.00)
GFR calc Af Amer: 30 mL/min — ABNORMAL LOW (ref >60)
GFR calc non Af Amer: 26 mL/min — ABNORMAL LOW (ref >60)
Glucose, Bld: 143 mg/dL — ABNORMAL HIGH (ref 70–99)
Potassium: 2.4 mmol/L — CL (ref 3.5–5.1)
Sodium: 145 mmol/L (ref 135–145)

## 2020-01-27 LAB — GLUCOSE, CAPILLARY
Glucose-Capillary: 129 mg/dL — ABNORMAL HIGH (ref 70–99)
Glucose-Capillary: 137 mg/dL — ABNORMAL HIGH (ref 70–99)
Glucose-Capillary: 138 mg/dL — ABNORMAL HIGH (ref 70–99)
Glucose-Capillary: 138 mg/dL — ABNORMAL HIGH (ref 70–99)
Glucose-Capillary: 157 mg/dL — ABNORMAL HIGH (ref 70–99)
Glucose-Capillary: 163 mg/dL — ABNORMAL HIGH (ref 70–99)

## 2020-01-27 LAB — MAGNESIUM: Magnesium: 2 mg/dL (ref 1.7–2.4)

## 2020-01-27 MED ORDER — POTASSIUM CHLORIDE 20 MEQ PO PACK
40.0000 meq | PACK | ORAL | Status: AC
  Administered 2020-01-27 (×2): 40 meq
  Filled 2020-01-27 (×2): qty 2

## 2020-01-27 MED ORDER — POTASSIUM CHLORIDE 20 MEQ PO PACK: 40.0000 meq | PACK | ORAL | Status: DC

## 2020-01-27 MED ORDER — PRO-STAT SUGAR FREE PO LIQD
30.0000 mL | Freq: Every day | ORAL | Status: DC
  Administered 2020-01-27 – 2020-02-07 (×11): 30 mL
  Filled 2020-01-27 (×13): qty 30

## 2020-01-27 MED ORDER — SODIUM CHLORIDE 0.9% FLUSH: 10.0000 mL | INTRAVENOUS | Status: DC | PRN

## 2020-01-27 MED ORDER — SODIUM CHLORIDE 0.9% FLUSH
10.0000 mL | Freq: Two times a day (BID) | INTRAVENOUS | Status: DC
  Administered 2020-01-27 – 2020-01-29 (×5): 10 mL

## 2020-01-27 MED ORDER — VITAL 1.5 CAL PO LIQD
1000.0000 mL | ORAL | Status: DC
  Administered 2020-01-27 – 2020-01-31 (×5): 1000 mL
  Filled 2020-01-27 (×6): qty 1000

## 2020-01-27 NOTE — Progress Notes (Signed)
Dr. Ella Jubilee informed of K+ critical value, new orders recvd.

## 2020-01-27 NOTE — Progress Notes (Addendum)
PROGRESS NOTE    Natasha Jacobson  ZOX:[id number] DOB: 1950/12/06 DOA: 01/13/2020 PCP: Ardyth Gal, MD    Brief Narrative:  Patient was admitted to the hospital with a working diagnosis of acute on chronic pancreatitis status, complicated by acute gastrointestinal bleed, due to splenic artery pseudoaneurysm, now sp embolization. She required invasive mechanica ventilation and vasopressors.    69 year old female who presented with abdominal pain and hematemesis.  She does have a significant past medical history for chronic alcoholic pancreatitis, she is status post exchange of pancreatico gastrostomy stent at [facility] on January 12, 2020.  Patient had recurrent abdominal pain and hematemesis that prompted her to come back to the hospital.  On her initial physical examination her blood pressure was 107/58, heart rate 85, respiratory rate 18, oxygen saturation 99%, her lungs are clear to auscultation bilaterally, heart S1-S2 present and rhythmic, her abdomen was tender to palpation in the epigastric region, no lower extremity edema. CT of the abdomen showed mild peripancreatic haziness suggesting acute on chronic pancreatitis.  Positive pancreatic stent with pigtail ending in the second portion of the duodenum.  Patient developed hypotension and hemorrhagic shock, requiring multiple PRBC transfusion and invasive mechanical ventilation.  Further work-up with upper endoscopy showed active upper GI bleeding, follow-up CT of the abdomen showed 7 mm pseudoaneurysm over pancreatic branch of the splenic artery, likely culprit of hemorrhage.  Interventional radiology performed a splenic arteriogram and coil embolization of pseudoaneurysm.  Patient failed extubation April 11, had to be reintubated April 12 due to increased work of breathing and respiratory distress.  Patient was stabilized and liberated from mechanical ventilation April 16, but patient developed worsening hypoxemic and hypercapnic respiratory  failure and had to be reintubated April 19.  Diagnosed with left-sided pneumonia and placed on antibiotic therapy.   Patient was liberated from mechanical ventilation April 22.  Transferred to [facility] 04/23.   Assessment & Plan:   Principal Problem:   Pancreatitis, acute Active Problems:   COPD (chronic obstructive pulmonary disease) (HCC)   Hypothyroidism   Essential hypertension   Acute pancreatitis   Gastrointestinal hemorrhage   Acute respiratory failure (HCC)   AKI (acute kidney injury) (HCC)   Anemia   Hemorrhagic shock (HCC)   Acute encephalopathy   HAP (hospital-acquired pneumonia)   Septic shock (HCC)   Goals of care, counseling/discussion   Palliative care by specialist   DNR (do not resuscitate)   Advanced care planning/counseling discussion   1. Acute pancreatitis, complicated with hemorrhagic shock (acutye blood loss anemia), due to upper GI bleed, splenic artery pseudoaneurysm. (presnt on admission).  Her Hgb has remain stable, with no signs of further bleeding. Patient is tolerating tube feedings per NG.  Blood pressure 109/46/   Will continue supportive medical care and nutrition. Will consult speech therapy. Continue to wean vasopressors to MAP more than 60 mmHg. Will change pantoprazole IV to per NG.   2. Acute hypoxic respiratory failure due to left lower lobe aspiration pneumonia. Patient now off mechanical ventilation, her respiratory cultures were positive for MSSA and enterobacter.   Continue antibiotic therapy with linezoild and cefepime, will plan for 8 day course therapy for now. Continue bronchodilator therapy and aspiration precautions.   Patient has a high risk for worsening respiratory failure.   3. AKI with hypernatremia. Will check renal panel and electrolytes today, patient has been tolerating well tube feedings and free water flushes.   4. Acute metabolic encephalopathy. Patient is awake and alert, no very communicative. Continue neuro  checks per  unit protocol.    5. Severe calorie protein malnutrition. Continue nutritional supplementation per tube feedings.   6. Hypothyroid. Continue with levothyroxine  7. T2DM. Continue glucose cover and monitoring with insulin sliding scale.   DVT prophylaxis: scd   Code Status:   dnr   Family Communication:  No family at the bedside 3  Disposition Plan:   Patient is from:  Home   Anticipated DC to:  SNF   Anticipated DC date:  To be determined   Anticipated DC barriers: Patient continue critically ill, continue on vasopressors and IV antibiotic therapy, need very close hemodynamic monitoring.      Nutrition Status: Nutrition Problem: Inadequate oral intake Etiology: inability to eat Signs/Symptoms: NPO status Interventions: Tube feeding, Prostat    Procedures: Splenic artery embolization   Antimicrobials:   Cefepime and linezolid    Subjective: Patient very weak and deconditioned, complains of back pain, no nausea or vomiting, she has been on nasal cannula with good toleration, no increased work of breathing.   Objective: Vitals:   01/27/20 0615 01/27/20 0630 01/27/20 0645 01/27/20 0700  BP: (!) 104/45 (!) 110/39 (!) 104/41 (!) 112/42  Pulse: 80 79 79 72  Resp: (!) 25 (!) 23 19 (!) 22  Temp:      TempSrc:      SpO2: 100% 100% 100% 100%  Weight:      Height:        Intake/Output Summary (Last 24 hours) at 01/27/2020 7829 Last data filed at 01/27/2020 0600 Gross per 24 hour  Intake 2349.2 ml  Output 2325 ml  Net 24.2 ml   Filed Weights   01/25/20 0500 01/26/20 0500 01/27/20 0453  Weight: 54 kg 53 kg 62.6 kg    Examination:   General: deconditioned and ill looking appearing  Neurology: Awake and alert, non focal, limited communications.  E ENT: positive pallor, no icterus, oral mucosa moist Cardiovascular: No JVD. S1-S2 present, rhythmic, no gallops, rubs, or murmurs. No lower extremity edema. Pulmonary: positive breath sounds bilaterally,decreased air  movement, no wheezing, scattered bilateral rales with no rhonchi. Gastrointestinal. Abdomen with, no organomegaly, non tender, no rebound or guarding Skin. No rashes Musculoskeletal: no joint deformities     Data Reviewed: I have personally reviewed following labs and imaging studies  CBC: Recent Labs  Lab 01/23/20 0520 01/24/20 0113 01/24/20 0202 01/24/20 0341 01/24/20 0952 01/24/20 1219 01/24/20 1759 01/25/20 0445 01/25/20 0816 01/26/20 0430 01/26/20 0740  WBC 24.3*  --  31.8*  --  35.6*  --   --  36.8*  --  33.0*  --   NEUTROABS  --   --   --   --   --   --   --  32.8*  --   --   --   HGB 11.8*   < > 10.8*   < > 10.8*   < > 10.0* 9.7* 9.5* 9.3* 8.8*  HCT 35.1*   < > 34.7*   < > 36.0   < > 33.3* 31.9* 28.0* 30.3* 26.0*  MCV 92.4  --  99.1  --  100.6*  --   --  100.0  --  99.3  --   PLT 411*  --  424*  --  418*  --   --  396  --  306  --    < > = values in this interval not displayed.   Basic Metabolic Panel: Recent Labs  Lab 01/23/20 0520 01/24/20 0113 01/24/20 0202  01/24/20 0341 01/24/20 8280 01/25/20 0445 01/25/20 0816 01/26/20 0430 01/26/20 0740 01/27/20 0549  NA 151*   < > 153*   < > 153* 149* 148* 142 143  --   K 2.9*   < > 4.3   < > 4.4 4.3 4.3 3.4* 3.5  --   CL 114*  --  121*  --  125* 121*  --  113*  --   --   CO2 23  --  22  --  18* 18*  --  20*  --   --   GLUCOSE 148*  --  252*  --  144* 273*  --  241*  --   --   BUN 93*  --  98*  --  102* 118*  --  105*  --   --   CREATININE 2.02*  --  2.31*  --  2.71* 3.24*  --  2.48*  --   --   CALCIUM 7.7*  --  7.1*  --  6.9* 7.5*  --  7.4*  --   --   MG 1.6*  --  2.2  --   --  2.4  --  2.3  --  2.0  PHOS  --   --  6.8*  --   --   --   --   --   --   --    < > = values in this interval not displayed.   GFR: Estimated Creatinine Clearance: 19.3 mL/min (A) (by C-G formula based on SCr of 2.48 mg/dL (H)). Liver Function Tests: Recent Labs  Lab 01/22/20 0437 01/22/20 0437 01/23/20 0520 01/24/20 0202  01/24/20 0952 01/25/20 0445 01/26/20 0430  AST 48*  --  36  --  59* 118* 96*  ALT 83*  --  60*  --  55* 76* 85*  ALKPHOS 76  --  98  --  87 92 86  BILITOT 1.1  --  1.1  --  1.1 0.7 0.8  PROT 4.3*  --  4.7*  --  4.6* 4.6* 4.3*  ALBUMIN 1.5*   < > 1.5* 1.3* 1.3* 1.3* 1.2*   < > = values in this interval not displayed.   No results for input(s): LIPASE, AMYLASE in the last 168 hours. Recent Labs  Lab 01/22/20 1028 01/25/20 0445  AMMONIA 44* 37*   Coagulation Profile: No results for input(s): INR, PROTIME in the last 168 hours. Cardiac Enzymes: No results for input(s): CKTOTAL, CKMB, CKMBINDEX, TROPONINI in the last 168 hours. BNP (last 3 results) No results for input(s): PROBNP in the last 8760 hours. HbA1C: No results for input(s): HGBA1C in the last 72 hours. CBG: Recent Labs  Lab 01/26/20 1135 01/26/20 1538 01/26/20 1949 01/26/20 2343 01/27/20 0348  GLUCAP 200* 129* 129* 129* 138*   Lipid Profile: No results for input(s): CHOL, HDL, LDLCALC, TRIG, CHOLHDL, LDLDIRECT in the last 72 hours. Thyroid Function Tests: No results for input(s): TSH, T4TOTAL, FREET4, T3FREE, THYROIDAB in the last 72 hours. Anemia Panel: No results for input(s): VITAMINB12, FOLATE, FERRITIN, TIBC, IRON, RETICCTPCT in the last 72 hours.    Radiology Studies: I have reviewed all of the imaging during this hospital visit personally     Scheduled Meds: . Chlorhexidine Gluconate Cloth  6 each Topical Daily  . free water  200 mL Per Tube Q4H  . insulin aspart  0-6 Units Subcutaneous Q4H  . levothyroxine  50 mcg Per Tube Q0600  . oxyCODONE  5 mg Per  Tube Q6H  . pantoprazole (PROTONIX) IV  40 mg Intravenous Q12H  . sodium chloride flush  10-40 mL Intracatheter Q12H   Continuous Infusions: . sodium chloride 10 mL/hr at 01/26/20 0200  . ceFEPime (MAXIPIME) IV 2 g (01/26/20 2233)  . feeding supplement (VITAL AF 1.2 CAL) 1,000 mL (01/26/20 1233)  . norepinephrine (LEVOPHED) Adult infusion 3  mcg/min (01/26/20 2045)     LOS: 14 days        Demetric Parslow Gerome Apley, MD

## 2020-01-27 NOTE — Progress Notes (Addendum)
Nutrition Follow-up  DOCUMENTATION CODES:   Severe malnutrition in context of chronic illness  INTERVENTION:   Change tube feeding: - Vital 1.5 @ 45 ml/hr (1080 ml/day) via Cortrak - Pro-stat 30 ml daily - Free water per MD, currently 200 ml q 4 hours  Tube feeding regimen and current free water provides 1720 kcal, 88 grams of protein, and 2025 ml of H2O (meets 100% of kcal and protein needs).  NUTRITION DIAGNOSIS:   Severe Malnutrition related to chronic illness (chronic pancreatitis) as evidenced by severe fat depletion, severe muscle depletion.  New diagnosis after completion of NFPE  GOAL:   Patient will meet greater than or equal to 90% of their needs  Met via TF  MONITOR:   Diet advancement, Labs, Weight trends, TF tolerance, I & O's  REASON FOR ASSESSMENT:   Consult Enteral/tube feeding initiation and management  ASSESSMENT:   69 y.o. female with history of chronic alcoholic pancreatitis s/p exchange of pancreatico-gastrostomy stent at [facility] on 01/12/20. She started developing abdominal pain later in the evening which continued to worsen and 4/9 she began having hematemesis. She also had dark stools following which patient decided to come to the ED. Pain has been continuous epigastric in nature radiating to the back. CT abdomen/pelvis in the ED showed concern for acute on chronic pancreatitis and UA concerning for UTI. No bed available at [facility] to transfer patient so ED MD talked with GI and patient was admitted at [facility].  04/12 - reintubated for encephalopathy and hypoxia 04/13 - s/p EGD 04/16 - extubated, Cortrak placed (post-pyloric), off pressors, renal function improved 04/19 - reintubated 04/22 - extubated  Pt remains NPO after extubation. Pt receiving tube feeds via post-pyloric Cortrak. Pt continues to have diarrhea via rectal tube. Discussed pt with RN. Will adjust TF regimen now that pt has been extubated.  Levophed off this AM.  EDW: 50.7  kg  Current TF order: Vital AF 1.2 @ 50 ml/hr, free water 200 ml q 4 hours  Medications reviewed and include: SSI q 4 hours, protonix, NS @ 10 ml/hr, IV abx  Labs reviewed: BUN 105, creatinine 2.48, elevated LFTs, hemoglobin 8.8 CBG's: 129-200 x 24 hours  UOP: 1735 ml x 24 hours Stool: 650 ml + 2 unmeasured occurrences x 24 hours I/O's: +24.8 L since admit  NUTRITION - FOCUSED PHYSICAL EXAM:    Most Recent Value  Orbital Region  Severe depletion  Upper Arm Region  Severe depletion  Thoracic and Lumbar Region  Severe depletion  Buccal Region  Severe depletion  Temple Region  Severe depletion  Clavicle Bone Region  Severe depletion  Clavicle and Acromion Bone Region  Severe depletion  Scapular Bone Region  Unable to assess  Dorsal Hand  Moderate depletion  Patellar Region  Moderate depletion  Anterior Thigh Region  Moderate depletion  Posterior Calf Region  Moderate depletion  Edema (RD Assessment)  Moderate  Hair  Reviewed  Eyes  Reviewed  Mouth  Reviewed  Skin  Reviewed  Nails  Reviewed       Diet Order:   Diet Order            Diet NPO time specified  Diet effective now              EDUCATION NEEDS:   Not appropriate for education at this time  Skin:  Skin Assessment: Skin Integrity Issues: Other: MASD buttocks/groin  Last BM:  01/27/20 type 7 via rectal tube  Height:  Ht Readings from Last 1 Encounters:  01/14/20 '5\' 4"'  (1.626 m)    Weight:   Wt Readings from Last 1 Encounters:  01/27/20 62.6 kg    Ideal Body Weight:  54.5 kg  BMI:  Body mass index is 23.69 kg/m.  Estimated Nutritional Needs:   Kcal:  1600-1800  Protein:  80-95 grams  Fluid:  >/= 1.5 L    Gaynell Face, MS, RD, LDN Inpatient Clinical Dietitian Pager: [phone] Weekend/After Hours: [phone]

## 2020-01-27 NOTE — Progress Notes (Signed)
Physical Therapy Treatment Patient Details Name: Natasha Jacobson MRN: [id number] DOB: 1951-06-22 Today's Date: 01/27/2020    History of Present Illness Pt is a 69 yo female admitted with hemorrhagic shock 2/2 bleeding 52mm pseudoaneurysm and recent instrumentation s/p EGD and coil embolization by IR 4/10. Course also significant for UTI, acute on chronic pancreatitis, and ETT 4/10-4/11; reintubated for encephalopathy and hypoxia 4/12-4/16. PMH also includes: COPD, hearing loss, HTN, thyroid disease.  Reintubated 4/19 and extubated again 4/23.    PT Comments    Pt agreed only after max encouragement.  Emphasis on transition to EOB and sitting balance EOB.  Pt refused getting OOB and the more therapist pushed pt, the more HR rose.    Follow Up Recommendations  SNF     Equipment Recommendations  Other (comment)(TBA)    Recommendations for Other Services       Precautions / Restrictions Precautions Precautions: Fall Precaution Comments: cortrak,    Mobility  Bed Mobility Overal bed mobility: Needs Assistance Bed Mobility: Supine to Sit;Sit to Supine     Supine to sit: Max assist Sit to supine: Mod assist;+2 for physical assistance   General bed mobility comments: up via R elbow with trucal assist to bring into midline and mod assist to scoot to EOB  Transfers                 General transfer comment: pt deferred  Ambulation/Gait             General Gait Details: unable   Stairs             Wheelchair Mobility    Modified Rankin (Stroke Patients Only)       Balance Overall balance assessment: Needs assistance Sitting-balance support: Single extremity supported;No upper extremity supported Sitting balance-Leahy Scale: Fair Sitting balance - Comments: sat at EOB for 8 min, pt consistently wanting to lay back down.  VSS overall while sitting.l                                    Cognition Arousal/Alertness: Awake/alert Behavior  During Therapy: Anxious Overall Cognitive Status: Impaired/Different from baseline Area of Impairment: Following commands;Safety/judgement;Awareness;Problem solving;Attention                   Current Attention Level: Focused   Following Commands: Follows one step commands inconsistently;Follows one step commands with increased time Safety/Judgement: Decreased awareness of safety;Decreased awareness of deficits Awareness: Intellectual Problem Solving: Slow processing        Exercises Other Exercises Other Exercises: bil LE warm up ROM exercise.    General Comments General comments (skin integrity, edema, etc.): At EOB, sats 100% on 4L, RR 30 rpm, HR 87 bpm,       Pertinent Vitals/Pain Pain Assessment: Faces Faces Pain Scale: Hurts little more Pain Location: generalized Pain Descriptors / Indicators: Guarding;Grimacing;Discomfort Pain Intervention(s): Monitored during session    Home Living                      Prior Function            PT Goals (current goals can now be found in the care plan section) Acute Rehab PT Goals Patient Stated Goal: none stated today  PT Goal Formulation: With family Time For Goal Achievement: 02/04/20 Potential to Achieve Goals: Fair Progress towards PT goals: Not progressing toward goals - comment(pt is self limiting)  Frequency    Min 3X/week      PT Plan Current plan remains appropriate    Co-evaluation              AM-PAC PT "6 Clicks" Mobility   Outcome Measure  Help needed turning from your back to your side while in a flat bed without using bedrails?: Total Help needed moving from lying on your back to sitting on the side of a flat bed without using bedrails?: Total Help needed moving to and from a bed to a chair (including a wheelchair)?: Total Help needed standing up from a chair using your arms (e.g., wheelchair or bedside chair)?: Total Help needed to walk in hospital room?: Total Help needed  climbing 3-5 steps with a railing? : Total 6 Click Score: 6    End of Session   Activity Tolerance: Patient tolerated treatment well Patient left: in bed;with call bell/phone within reach Nurse Communication: Mobility status PT Visit Diagnosis: Other abnormalities of gait and mobility (R26.89);Muscle weakness (generalized) (M62.81);Pain Pain - part of body: (generalized)     Time: 1141-1200 PT Time Calculation (min) (ACUTE ONLY): 19 min  Charges:  $Therapeutic Activity: 8-22 mins                     01/27/2020  Ginger Carne., PT Acute Rehabilitation Services [phone]  (pager) [phone]  (office)   [name] 01/27/2020, 4:49 PM

## 2020-01-27 NOTE — Progress Notes (Signed)
Chart reviewed.  Patient examined.  Discussed with attending MD.  PMT will follow up with family on 4/24.  Norvel Richards, PA-C Palliative Medicine Office:  [phone]  No charge note.

## 2020-01-27 NOTE — Evaluation (Signed)
Clinical/Bedside Swallow Evaluation Patient Details  Name: Natasha Jacobson MRN: [id number] Date of Birth: 1950/10/19  Today's Date: 01/27/2020 Time: SLP Start Time (ACUTE ONLY): 1120 SLP Stop Time (ACUTE ONLY): 1129 SLP Time Calculation (min) (ACUTE ONLY): 9 min  Past Medical History:  Past Medical History:  Diagnosis Date  . 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 Alcohol-induced chronic pancreatitis (HCC)   . Brittle bone disease   . Chronic pain syndrome   . COPD (chronic obstructive pulmonary disease) (HCC)   . Hearing loss, right 07/08/2016  . Hepatitis C    Eradicated with antiviral treatment per patient  . High cholesterol   . Hypertension   . Hypopotassemia   . Hypothyroidism 07/08/2016  . Prediabetes   . SSS (sick sinus syndrome) (HCC) 09/06/2015  . Thyroid disease   . Vitamin D deficiency    Past Surgical History:  Past Surgical History:  Procedure Laterality Date  . ABDOMINAL HYSTERECTOMY    . APPENDECTOMY    . ERCP     multiple with stents placed   . ESOPHAGOGASTRODUODENOSCOPY N/A 01/17/2020   Procedure: ESOPHAGOGASTRODUODENOSCOPY (EGD);  Surgeon: Lemar Lofty., MD;  Location: [facility] ENDOSCOPY;  Service: Gastroenterology;  Laterality: N/A;  . ESOPHAGOGASTRODUODENOSCOPY (EGD) WITH PROPOFOL N/A 01/14/2020   Procedure: ESOPHAGOGASTRODUODENOSCOPY (EGD) WITH PROPOFOL;  Surgeon: Benancio Deeds, MD;  Location: [facility] ENDOSCOPY;  Service: Gastroenterology;  Laterality: N/A;  . FOREIGN BODY REMOVAL  01/17/2020   Procedure: FOREIGN BODY REMOVAL;  Surgeon: Meridee Score Netty Starring., MD;  Location: [facility] ENDOSCOPY;  Service: Gastroenterology;;  gastric lavage  . IR ANGIOGRAM SELECTIVE EACH ADDITIONAL VESSEL  01/15/2020  . IR ANGIOGRAM VISCERAL SELECTIVE  01/15/2020  . IR EMBO ART  VEN HEMORR LYMPH EXTRAV  INC GUIDE ROADMAPPING  01/15/2020  . IR US GUIDE VASC ACCESS RIGHT  01/15/2020  . PACEMAKER IMPLANT    . TONSILLECTOMY     HPI:  Pt  is a 69 yo female admitted with hemorrhagic shock 2/2 bleeding 45mm pseudoaneurysm and recent instrumentation s/p EGD and coil embolization by IR 4/10. Course also significant for UTI, acute on chronic pancreatitis, and ETT 4/10-4/11; reintubated for encephalopathy and hypoxia 4/12-4/16. BSE 4/17 recommended NPO. Pt reintubated 4/19-4/22 due to respiratory distress. PMH also includes: COPD, hearing loss, HTN, thyroid disease.   Assessment / Plan / Recommendation Clinical Impression  Presently pt is unable to safely consume po's due to significant deconditioning, intubation x 3 this admission and cognitive impairments. Eyes open, followed 2 commands throughout assessment.  She keeps her neck in an extended position and flexes when asked however she tilts it back immediately. She was easily startled with any movement around face or upper trunk and question pt's visual status in combination with general anxiety. Lips dry and exfoliated by therapist. Ice or thin liquid would have fallen into trachea during assessment therefore only wet spoon presentatations trialed with cues for pt to close lips around spoon x 5. Continue oral care, Cortrak feeds. RN reported pt able to take small ice chip earlier this morning when head in neutral and increased awareness. If appropriate can give 1/2 ice chips for moisture anc comfort. Will continue ST intervention.     SLP Visit Diagnosis: Dysphagia, unspecified (R13.10)    Aspiration Risk  Severe aspiration risk    Diet Recommendation NPO;Ice chips PRN after oral care   Medication Administration: Via alternative means    Other  Recommendations Oral Care Recommendations: Oral care QID Other Recommendations: Have  oral suction available   Follow up Recommendations Skilled Nursing facility      Frequency and Duration min 2x/week  2 weeks       Prognosis Prognosis for Safe Diet Advancement: (fair-good) Barriers to Reach Goals: Cognitive deficits;Behavior(intubation  x 3)      Swallow Study   General HPI: Pt is a 69 yo female admitted with hemorrhagic shock 2/2 bleeding 29mm pseudoaneurysm and recent instrumentation s/p EGD and coil embolization by IR 4/10. Course also significant for UTI, acute on chronic pancreatitis, and ETT 4/10-4/11; reintubated for encephalopathy and hypoxia 4/12-4/16. BSE 4/17 recommended NPO. Pt reintubated 4/19-4/22 due to respiratory distress. PMH also includes: COPD, hearing loss, HTN, thyroid disease. Type of Study: Bedside Swallow Evaluation Previous Swallow Assessment: none in cahrt(bedside swallow this admission ) Diet Prior to this Study: NPO;NG Tube Temperature Spikes Noted: No Respiratory Status: Nasal cannula History of Recent Intubation: Yes Length of Intubations (days): (intubated 3 times since this admission ) Date extubated: 01/26/20 Behavior/Cognition: Distractible;Requires cueing;Confused Oral Cavity Assessment: Dry Oral Care Completed by SLP: Yes Oral Cavity - Dentition: Edentulous Vision: Impaired for self-feeding Self-Feeding Abilities: Total assist Patient Positioning: Upright in bed Baseline Vocal Quality: Hoarse;Low vocal intensity Volitional Cough: Cognitively unable to elicit;Other (Comment)(in addition to deconditionng) Volitional Swallow: Unable to elicit    Oral/Motor/Sensory Function Overall Oral Motor/Sensory Function: Generalized oral weakness   Ice Chips Ice chips: Not tested   Thin Liquid Thin Liquid: Not tested    Nectar Thick Nectar Thick Liquid: Not tested   Honey Thick Honey Thick Liquid: Not tested   Puree Puree: Not tested   Solid     Solid: Not tested      Houston Siren 01/27/2020,2:16 PM  Orbie Pyo Colvin Caroli.Ed Risk analyst [phone] Office [phone]

## 2020-01-28 ENCOUNTER — Inpatient Hospital Stay (HOSPITAL_COMMUNITY): Payer: Medicare Other

## 2020-01-28 DIAGNOSIS — Z7189 Other specified counseling: Secondary | ICD-10-CM | POA: Diagnosis not present

## 2020-01-28 DIAGNOSIS — K859 Acute pancreatitis without necrosis or infection, unspecified: Secondary | ICD-10-CM | POA: Diagnosis not present

## 2020-01-28 DIAGNOSIS — G9340 Encephalopathy, unspecified: Secondary | ICD-10-CM | POA: Diagnosis not present

## 2020-01-28 DIAGNOSIS — K852 Alcohol induced acute pancreatitis without necrosis or infection: Secondary | ICD-10-CM | POA: Diagnosis not present

## 2020-01-28 LAB — CBC WITH DIFFERENTIAL/PLATELET
Abs Immature Granulocytes: 2.58 10*3/uL — ABNORMAL HIGH (ref 0.00–0.07)
Basophils Absolute: 0.1 10*3/uL (ref 0.0–0.1)
Basophils Relative: 0 %
Eosinophils Absolute: 0.1 10*3/uL (ref 0.0–0.5)
Eosinophils Relative: 0 %
HCT: 29.4 % — ABNORMAL LOW (ref 36.0–46.0)
Hemoglobin: 9.6 g/dL — ABNORMAL LOW (ref 12.0–15.0)
Immature Granulocytes: 6 %
Lymphocytes Relative: 2 %
Lymphs Abs: 0.9 10*3/uL (ref 0.7–4.0)
MCH: 31.2 pg (ref 26.0–34.0)
MCHC: 32.7 g/dL (ref 30.0–36.0)
MCV: 95.5 fL (ref 80.0–100.0)
Monocytes Absolute: 1.3 10*3/uL — ABNORMAL HIGH (ref 0.1–1.0)
Monocytes Relative: 3 %
Neutro Abs: 38.1 10*3/uL — ABNORMAL HIGH (ref 1.7–7.7)
Neutrophils Relative %: 89 %
Platelets: 305 10*3/uL (ref 150–400)
RBC: 3.08 MIL/uL — ABNORMAL LOW (ref 3.87–5.11)
RDW: 17.4 % — ABNORMAL HIGH (ref 11.5–15.5)
WBC: 43.1 10*3/uL — ABNORMAL HIGH (ref 4.0–10.5)
nRBC: 1.8 % — ABNORMAL HIGH (ref 0.0–0.2)

## 2020-01-28 LAB — C DIFFICILE QUICK SCREEN W PCR REFLEX
C Diff antigen: NEGATIVE
C Diff interpretation: NOT DETECTED
C Diff toxin: NEGATIVE

## 2020-01-28 LAB — GLUCOSE, CAPILLARY
Glucose-Capillary: 155 mg/dL — ABNORMAL HIGH (ref 70–99)
Glucose-Capillary: 150 mg/dL — ABNORMAL HIGH (ref 70–99)
Glucose-Capillary: 184 mg/dL — ABNORMAL HIGH (ref 70–99)
Glucose-Capillary: 145 mg/dL — ABNORMAL HIGH (ref 70–99)
Glucose-Capillary: 33 mg/dL — CL (ref 70–99)
Glucose-Capillary: 172 mg/dL — ABNORMAL HIGH (ref 70–99)
Glucose-Capillary: 164 mg/dL — ABNORMAL HIGH (ref 70–99)

## 2020-01-28 LAB — BASIC METABOLIC PANEL
Anion gap: 9 (ref 5–15)
BUN: 67 mg/dL — ABNORMAL HIGH (ref 8–23)
CO2: 21 mmol/L — ABNORMAL LOW (ref 22–32)
Calcium: 7.4 mg/dL — ABNORMAL LOW (ref 8.9–10.3)
Chloride: 118 mmol/L — ABNORMAL HIGH (ref 98–111)
Creatinine, Ser: 1.87 mg/dL — ABNORMAL HIGH (ref 0.44–1.00)
GFR calc Af Amer: 32 mL/min — ABNORMAL LOW (ref >60)
GFR calc non Af Amer: 28 mL/min — ABNORMAL LOW (ref >60)
Glucose, Bld: 168 mg/dL — ABNORMAL HIGH (ref 70–99)
Potassium: 2.7 mmol/L — CL (ref 3.5–5.1)
Sodium: 148 mmol/L — ABNORMAL HIGH (ref 135–145)

## 2020-01-28 MED ORDER — POTASSIUM CHLORIDE 10 MEQ/100ML IV SOLN
INTRAVENOUS | Status: AC
  Administered 2020-01-28: 10 meq via INTRAVENOUS
  Filled 2020-01-28: qty 100

## 2020-01-28 MED ORDER — PANTOPRAZOLE SODIUM 40 MG PO PACK
40.0000 mg | PACK | Freq: Two times a day (BID) | ORAL | Status: DC
  Administered 2020-01-28 – 2020-02-01 (×10): 40 mg
  Filled 2020-01-28 (×10): qty 20

## 2020-01-28 MED ORDER — FREE WATER
300.0000 mL | Status: DC
  Administered 2020-01-28 – 2020-01-29 (×6): 300 mL

## 2020-01-28 MED ORDER — POTASSIUM CHLORIDE 20 MEQ PO PACK
40.0000 meq | PACK | Freq: Once | ORAL | Status: AC
  Administered 2020-01-28: 40 meq via ORAL
  Filled 2020-01-28 (×2): qty 2

## 2020-01-28 MED ORDER — MORPHINE SULFATE (PF) 2 MG/ML IV SOLN
2.0000 mg | INTRAVENOUS | Status: DC | PRN
  Administered 2020-01-28 – 2020-01-29 (×2): 2 mg via INTRAVENOUS
  Filled 2020-01-28 (×2): qty 1

## 2020-01-28 MED ORDER — MORPHINE SULFATE (PF) 2 MG/ML IV SOLN: 2.0000 mg | INTRAVENOUS | Status: DC | PRN

## 2020-01-28 MED ORDER — MORPHINE SULFATE (PF) 2 MG/ML IV SOLN
1.0000 mg | INTRAVENOUS | Status: DC | PRN
  Administered 2020-01-28 (×2): 1 mg via INTRAVENOUS
  Filled 2020-01-28 (×2): qty 1

## 2020-01-28 MED ORDER — POTASSIUM CHLORIDE 10 MEQ/100ML IV SOLN
10.0000 meq | INTRAVENOUS | Status: AC
  Administered 2020-01-28 (×3): 10 meq via INTRAVENOUS
  Filled 2020-01-28 (×3): qty 100

## 2020-01-28 NOTE — Progress Notes (Signed)
PROGRESS NOTE    Natasha Jacobson  PYP:[id number] DOB: 1951-02-16 DOA: 01/13/2020 PCP: Ardyth Gal, MD    Brief Narrative:  Patient was admitted to the hospital with a working diagnosis of acute on chronic pancreatitis status, complicated by acute gastrointestinal bleed, due to splenic artery pseudoaneurysm, now sp embolization. She required invasive mechanica ventilation and vasopressors.    69 year old female who presented with abdominal pain and hematemesis.  She does have a significant past medical history for chronic alcoholic pancreatitis, she is status post exchange of pancreatico gastrostomy stent at [facility] on January 12, 2020.  Patient had recurrent abdominal pain and hematemesis that prompted her to come back to the hospital.  On her initial physical examination her blood pressure was 107/58, heart rate 85, respiratory rate 18, oxygen saturation 99%, her lungs are clear to auscultation bilaterally, heart S1-S2 present and rhythmic, her abdomen was tender to palpation in the epigastric region, no lower extremity edema. CT of the abdomen showed mild peripancreatic haziness suggesting acute on chronic pancreatitis.  Positive pancreatic stent with pigtail ending in the second portion of the duodenum.  Patient developed hypotension and hemorrhagic shock, requiring multiple PRBC transfusion and invasive mechanical ventilation.  Further work-up with upper endoscopy showed active upper GI bleeding, follow-up CT of the abdomen showed 7 mm pseudoaneurysm over pancreatic branch of the splenic artery, likely culprit of hemorrhage.  Interventional radiology performed a splenic arteriogram and coil embolization of pseudoaneurysm.  Patient failed extubation April 11, had to be reintubated April 12 due to increased work of breathing and respiratory distress.  Patient was stabilized and liberated from mechanical ventilation April 16, but patient developed worsening hypoxemic and hypercapnic  respiratory failure and had to be reintubated April 19.  Diagnosed with left-sided pneumonia and placed on antibiotic therapy.   Patient was liberated from mechanical ventilation April 22.  Transferred to [facility] 04/23.    Assessment & Plan:   Principal Problem:   Pancreatitis, acute Active Problems:   COPD (chronic obstructive pulmonary disease) (HCC)   Hypothyroidism   Essential hypertension   Acute pancreatitis   Gastrointestinal hemorrhage   Acute respiratory failure (HCC)   AKI (acute kidney injury) (HCC)   Anemia   Hemorrhagic shock (HCC)   Acute encephalopathy   HAP (hospital-acquired pneumonia)   Septic shock (HCC)   Goals of care, counseling/discussion   Palliative care by specialist   DNR (do not resuscitate)   Advanced care planning/counseling discussion   Protein-calorie malnutrition, severe   1. Acute on chronic alcoholic pancreatitis, complicated with hemorrhagic shock (acutye blood loss anemia), due to upper GI bleed, splenic artery pseudoaneurysm (inside the gastric wall at the site of the stent crossing into the stomach). (presnt on admission) sp pancreatic gastroctomy stent at [facility] on January 12, 2020.   Hgb today is 9,6 with no signs of further bleeding. Patient continue tolerating tube feedings per NG, complains of thirst and back pain.  Blood pressure 129/66 off pressors.    Persistent significant leukocytosis up to 43 today, las CT from 04/10 no pancreatic abscess. Will repeat abdominal CT to rule out abscess formation or necrosis. Patient with severe diarrhea, positive odor, will sent for c diff testing.   Noted purpuric discoloration of her toes, now off vasopressors.   2. Acute hypoxic respiratory failure due to left lower lobe aspiration pneumonia. Patient now off mechanical ventilation, her respiratory cultures were positive for MSSA and enterobacter.   Continue with cefepime #6/8, patient completed linezolid course. On bronchodilator therapy  and  continue with aspiration precautions. Pending swallow evaluation per speech therapy.   Patient has a high risk for worsening respiratory failure.   3. AKI with hypernatremia/ hypokelamia. Renal function with serum cr down to 1,87, K is 2,7 and serum bicarbonate at 21. Will continue K correction with Kcl 40 meq IV and 40 meq per tube. Follow up renal function in am. Will increase free water 300 cc q 4 H.   4. Acute metabolic encephalopathy. Patient this am is more awake and alert. Following commands, requesting water and complaining of back pain. Follows commands. Continue neuro checks per unit protocol.   5. Severe calorie protein malnutrition. Nutritional supplementation per tube feedings.   6. Hypothyroid. On levothyroxine  7. T2DM. Fasting glucose this a, 160, will continue glucose cover and monitoring with insulin sliding scale.   DVT prophylaxis:      scd   Code Status:              dnr   Family Communication:       No family at the bedside 3  Disposition Plan:              Patient is from:                        Home              Anticipated DC to:                   SNF              Anticipated DC date:               To be determined              Anticipated DC barriers:         Patient continue critically ill, continue on vasopressors and IV antibiotic therapy, need very close hemodynamic monitoring.                 Nutrition Status: Nutrition Problem: Severe Malnutrition Etiology: chronic illness(chronic pancreatitis) Signs/Symptoms: severe fat depletion, severe muscle depletion Interventions: Tube feeding, Prostat    Subjective: Patient continue to have back pain and positive thirst, no nausea or vomiting, positive diarrhea, no nausea or vomiting, continue to have NG tube,  Objective: Vitals:   01/28/20 0129 01/28/20 0200 01/28/20 0312 01/28/20 0800  BP:  (!) 134/59 (!) 128/59 129/66  Pulse:   88   Resp:  (!) 23 (!) 30 (!) 26  Temp:  98.5 F (36.9 C) 98.3  F (36.8 C) 98.5 F (36.9 C)  TempSrc:  Axillary Oral Axillary  SpO2:  97% 98%   Weight: 60.4 kg     Height:        Intake/Output Summary (Last 24 hours) at 01/28/2020 0805 Last data filed at 01/28/2020 0130 Gross per 24 hour  Intake 1160.56 ml  Output 1895 ml  Net -734.44 ml   Filed Weights   01/26/20 0500 01/27/20 0453 01/28/20 0129  Weight: 53 kg 62.6 kg 60.4 kg    Examination:   General: Not in pain or dyspnea, deconditioned and ill looking appearing  Neurology: Awake and alert, non focal  E ENT: positive pallor, no icterus, oral mucosa dry Cardiovascular: No JVD. S1-S2 present, rhythmic, no gallops, rubs, or murmurs. No lower extremity edema. Pulmonary: positive breath sounds bilaterally, decreased air movement, no wheezing, rhonchi or rales. Gastrointestinal. Abdomen with no organomegaly, non  tender, no rebound or guarding Skin. No rashes Musculoskeletal: no joint deformities     Data Reviewed: I have personally reviewed following labs and imaging studies  CBC: Recent Labs  Lab 01/24/20 0952 01/24/20 1219 01/25/20 0445 01/25/20 0445 01/25/20 0816 01/26/20 0430 01/26/20 0740 01/27/20 1200 01/28/20 0420  WBC 35.6*  --  36.8*  --   --  33.0*  --  41.8* 43.1*  NEUTROABS  --   --  32.8*  --   --   --   --  41.8* 38.1*  HGB 10.8*   < > 9.7*   < > 9.5* 9.3* 8.8* 8.9* 9.6*  HCT 36.0   < > 31.9*   < > 28.0* 30.3* 26.0* 27.7* 29.4*  MCV 100.6*  --  100.0  --   --  99.3  --  95.2 95.5  PLT 418*  --  396  --   --  306  --  275 305   < > = values in this interval not displayed.   Basic Metabolic Panel: Recent Labs  Lab 01/23/20 0520 01/24/20 0113 01/24/20 0202 01/24/20 0341 01/24/20 1700 01/24/20 1749 01/25/20 0445 01/25/20 0445 01/25/20 0816 01/26/20 0430 01/26/20 0740 01/27/20 0549 01/27/20 1200 01/28/20 0420  NA 151*   < > 153*   < > 153*   < > 149*   < > 148* 142 143  --  145 148*  K 2.9*   < > 4.3   < > 4.4   < > 4.3   < > 4.3 3.4* 3.5  --  2.4*  2.7*  CL 114*  --  121*   < > 125*  --  121*  --   --  113*  --   --  117* 118*  CO2 23  --  22   < > 18*  --  18*  --   --  20*  --   --  19* 21*  GLUCOSE 148*  --  252*   < > 144*  --  273*  --   --  241*  --   --  143* 168*  BUN 93*  --  98*   < > 102*  --  118*  --   --  105*  --   --  82* 67*  CREATININE 2.02*  --  2.31*   < > 2.71*  --  3.24*  --   --  2.48*  --   --  1.98* 1.87*  CALCIUM 7.7*  --  7.1*   < > 6.9*  --  7.5*  --   --  7.4*  --   --  7.2* 7.4*  MG 1.6*  --  2.2  --   --   --  2.4  --   --  2.3  --  2.0  --   --   PHOS  --   --  6.8*  --   --   --   --   --   --   --   --   --   --   --    < > = values in this interval not displayed.   GFR: Estimated Creatinine Clearance: 25.6 mL/min (A) (by C-G formula based on SCr of 1.87 mg/dL (H)). Liver Function Tests: Recent Labs  Lab 01/22/20 0437 01/22/20 0437 01/23/20 0520 01/24/20 0202 01/24/20 0952 01/25/20 0445 01/26/20 0430  AST 48*  --  36  --  59* 118* 96*  ALT 83*  --  60*  --  55* 76* 85*  ALKPHOS 76  --  98  --  87 92 86  BILITOT 1.1  --  1.1  --  1.1 0.7 0.8  PROT 4.3*  --  4.7*  --  4.6* 4.6* 4.3*  ALBUMIN 1.5*   < > 1.5* 1.3* 1.3* 1.3* 1.2*   < > = values in this interval not displayed.   No results for input(s): LIPASE, AMYLASE in the last 168 hours. Recent Labs  Lab 01/22/20 1028 01/25/20 0445  AMMONIA 44* 37*   Coagulation Profile: No results for input(s): INR, PROTIME in the last 168 hours. Cardiac Enzymes: No results for input(s): CKTOTAL, CKMB, CKMBINDEX, TROPONINI in the last 168 hours. BNP (last 3 results) No results for input(s): PROBNP in the last 8760 hours. HbA1C: No results for input(s): HGBA1C in the last 72 hours. CBG: Recent Labs  Lab 01/27/20 1235 01/27/20 1644 01/27/20 2017 01/27/20 2356 01/28/20 0310  GLUCAP 129* 163* 138* 157* 155*   Lipid Profile: No results for input(s): CHOL, HDL, LDLCALC, TRIG, CHOLHDL, LDLDIRECT in the last 72 hours. Thyroid Function Tests: No  results for input(s): TSH, T4TOTAL, FREET4, T3FREE, THYROIDAB in the last 72 hours. Anemia Panel: No results for input(s): VITAMINB12, FOLATE, FERRITIN, TIBC, IRON, RETICCTPCT in the last 72 hours.    Radiology Studies: I have reviewed all of the imaging during this hospital visit personally     Scheduled Meds: . Chlorhexidine Gluconate Cloth  6 each Topical Daily  . feeding supplement (PRO-STAT SUGAR FREE 64)  30 mL Per Tube Daily  . free water  200 mL Per Tube Q4H  . insulin aspart  0-6 Units Subcutaneous Q4H  . levothyroxine  50 mcg Per Tube Q0600  . oxyCODONE  5 mg Per Tube Q6H  . pantoprazole (PROTONIX) IV  40 mg Intravenous Q12H  . sodium chloride flush  10-40 mL Intracatheter Q12H  . sodium chloride flush  10-40 mL Intracatheter Q12H   Continuous Infusions: . sodium chloride 10 mL/hr at 01/27/20 1600  . ceFEPime (MAXIPIME) IV 2 g (01/27/20 2306)  . feeding supplement (VITAL 1.5 CAL) 1,000 mL (01/28/20 0500)  . norepinephrine (LEVOPHED) Adult infusion Stopped (01/27/20 0901)  . potassium chloride 10 mEq (01/28/20 0728)     LOS: 15 days        Stran Raper Annett Gula, MD

## 2020-01-28 NOTE — Progress Notes (Signed)
Pharmacy Antibiotic Note  Malikah Principato is a 69 y.o. female admitted on 01/13/2020 with pneumonia.  Pharmacy has been consulted for Cefepime dosing.  Patient had received Ceftriaxone and Ampicillin for Enterococcus UTI prior to treatment for PNA. 1 dose of vancomycin was given on 4/20 and was changed to linezolid on 4/21, stopped on 4/22. Plan to treat PNA for 8 days per MD.   Plan: Cefepime 2g IV every 24 hours x 8 days (through 4/27) Monitor renal function and cultures  Height: 5\' 4"  (162.6 cm) Weight: 60.4 kg (133 lb 2.5 oz) IBW/kg (Calculated) : 54.7  Temp (24hrs), Avg:98.7 F (37.1 C), Min:98.3 F (36.8 C), Max:99.2 F (37.3 C)  Recent Labs  Lab 01/24/20 0952 01/24/20 1514 01/25/20 0445 01/26/20 0430 01/27/20 1200 01/28/20 0420  WBC 35.6*  --  36.8* 33.0* 41.8* 43.1*  CREATININE 2.71*  --  3.24* 2.48* 1.98* 1.87*  LATICACIDVEN 2.2* 3.7* 1.7  --   --   --     Estimated Creatinine Clearance: 25.6 mL/min (A) (by C-G formula based on SCr of 1.87 mg/dL (H)).    Allergies  Allergen Reactions  . Buprenorphine Nausea Only  . Aspirin Nausea Only    GI upset GI upset     Antimicrobials this admission: Zosyn x1 4/9 CTX 4/10>>4/11 Amp 4/12>>4/17  Cefepime 4/20>>(4/27) Vancomycin x1 4/20 Linezolid 4/21>>4/22  Microbiology results: 4/9 Ucx: 100K e faecalis (p-sensitive) 4/9 Bcx: ngF 4/10 MRSA PCR: neg 4/10 Ucx: 100K e faecalis (p-sensitive) 4/16 BCx: ngF 4/16 TA: Enterobacter cloacae (S-cefepime, cipro, imipenem, bactrim) 4/20 BCx: ngtd 4/20 UCx: multiple species - suggest recollection 4/20 TA: MSSA, enterobacter cloacae (S-cefepime, cipro, imipenem, bactrim)  Thank you for allowing pharmacy to be a part of this patient's care.  5/20, PharmD, BCPS PGY2 Cardiology Pharmacy Resident Phone [phone] 01/28/2020       1:02 PM  Please check [email or web address] for unit-specific pharmacist phone numbers

## 2020-01-28 NOTE — Plan of Care (Signed)
  Problem: Education: Goal: Knowledge of General Education information will improve Description: Including pain rating scale, medication(s)/side effects and non-pharmacologic comfort measures Outcome: Progressing   Problem: Health Behavior/Discharge Planning: Goal: Ability to manage health-related needs will improve Outcome: Progressing   Problem: Clinical Measurements: Goal: Ability to maintain clinical measurements within normal limits will improve Outcome: Progressing Goal: Will remain free from infection Outcome: Progressing Goal: Diagnostic test results will improve Outcome: Progressing Goal: Respiratory complications will improve Outcome: Progressing Goal: Cardiovascular complication will be avoided Outcome: Progressing   Problem: Activity: Goal: Risk for activity intolerance will decrease Outcome: Progressing   Problem: Nutrition: Goal: Adequate nutrition will be maintained Outcome: Progressing   Problem: Coping: Goal: Level of anxiety will decrease Outcome: Progressing   Problem: Elimination: Goal: Will not experience complications related to bowel motility Outcome: Progressing Goal: Will not experience complications related to urinary retention Outcome: Progressing   Problem: Pain Managment: Goal: General experience of comfort will improve Outcome: Progressing   Problem: Safety: Goal: Ability to remain free from injury will improve Outcome: Progressing   Problem: Skin Integrity: Goal: Risk for impaired skin integrity will decrease Outcome: Progressing   Problem: Activity: Goal: Ability to tolerate increased activity will improve Outcome: Progressing   Problem: Respiratory: Goal: Ability to maintain a clear airway and adequate ventilation will improve Outcome: Progressing   Problem: Role Relationship: Goal: Method of communication will improve Outcome: Progressing   Problem: Education: Goal: Ability to identify signs and symptoms of  gastrointestinal bleeding will improve Outcome: Progressing   Problem: Bowel/Gastric: Goal: Will show no signs and symptoms of gastrointestinal bleeding Outcome: Progressing   Problem: Fluid Volume: Goal: Will show no signs and symptoms of excessive bleeding Outcome: Progressing   Problem: Clinical Measurements: Goal: Complications related to the disease process, condition or treatment will be avoided or minimized Outcome: Progressing   

## 2020-01-28 NOTE — Progress Notes (Deleted)
Order discontinued by MD, please reconsult if desire.  Thank you. Rolena Infante, MS [facility] SLP Acute Rehab Services. Office [phone]

## 2020-01-28 NOTE — Progress Notes (Signed)
CRITICAL VALUE ALERT  Critical Value:  Potassium 2.7  Date & Time Notied:  01/28/2020 at 0530  Provider Notified: Rana Snare, APP.  Orders Received/Actions taken: Pending

## 2020-01-29 ENCOUNTER — Inpatient Hospital Stay (HOSPITAL_COMMUNITY): Payer: Medicare Other

## 2020-01-29 DIAGNOSIS — Z515 Encounter for palliative care: Secondary | ICD-10-CM | POA: Diagnosis not present

## 2020-01-29 DIAGNOSIS — R0603 Acute respiratory distress: Secondary | ICD-10-CM | POA: Diagnosis not present

## 2020-01-29 DIAGNOSIS — G9340 Encephalopathy, unspecified: Secondary | ICD-10-CM | POA: Diagnosis not present

## 2020-01-29 DIAGNOSIS — K852 Alcohol induced acute pancreatitis without necrosis or infection: Secondary | ICD-10-CM | POA: Diagnosis not present

## 2020-01-29 DIAGNOSIS — K859 Acute pancreatitis without necrosis or infection, unspecified: Secondary | ICD-10-CM | POA: Diagnosis not present

## 2020-01-29 DIAGNOSIS — Z7189 Other specified counseling: Secondary | ICD-10-CM | POA: Diagnosis not present

## 2020-01-29 LAB — CBC WITH DIFFERENTIAL/PLATELET
Abs Immature Granulocytes: 4.25 10*3/uL — ABNORMAL HIGH (ref 0.00–0.07)
Basophils Absolute: 0 10*3/uL (ref 0.0–0.1)
Basophils Relative: 0 %
Eosinophils Absolute: 0.2 10*3/uL (ref 0.0–0.5)
Eosinophils Relative: 1 %
HCT: 30.3 % — ABNORMAL LOW (ref 36.0–46.0)
Hemoglobin: 9.9 g/dL — ABNORMAL LOW (ref 12.0–15.0)
Immature Granulocytes: 10 %
Lymphocytes Relative: 3 %
Lymphs Abs: 1.5 10*3/uL (ref 0.7–4.0)
MCH: 30.7 pg (ref 26.0–34.0)
MCHC: 32.7 g/dL (ref 30.0–36.0)
MCV: 93.8 fL (ref 80.0–100.0)
Monocytes Absolute: 1.6 10*3/uL — ABNORMAL HIGH (ref 0.1–1.0)
Monocytes Relative: 4 %
Neutro Abs: 36.5 10*3/uL — ABNORMAL HIGH (ref 1.7–7.7)
Neutrophils Relative %: 82 %
Platelets: 309 10*3/uL (ref 150–400)
RBC: 3.23 MIL/uL — ABNORMAL LOW (ref 3.87–5.11)
RDW: 17.7 % — ABNORMAL HIGH (ref 11.5–15.5)
WBC: 44.1 10*3/uL — ABNORMAL HIGH (ref 4.0–10.5)
nRBC: 2.2 % — ABNORMAL HIGH (ref 0.0–0.2)

## 2020-01-29 LAB — BASIC METABOLIC PANEL
Anion gap: 9 (ref 5–15)
BUN: 51 mg/dL — ABNORMAL HIGH (ref 8–23)
CO2: 20 mmol/L — ABNORMAL LOW (ref 22–32)
Calcium: 7.4 mg/dL — ABNORMAL LOW (ref 8.9–10.3)
Chloride: 120 mmol/L — ABNORMAL HIGH (ref 98–111)
Creatinine, Ser: 1.47 mg/dL — ABNORMAL HIGH (ref 0.44–1.00)
GFR calc Af Amer: 43 mL/min — ABNORMAL LOW (ref >60)
GFR calc non Af Amer: 37 mL/min — ABNORMAL LOW (ref >60)
Glucose, Bld: 128 mg/dL — ABNORMAL HIGH (ref 70–99)
Potassium: 2.7 mmol/L — CL (ref 3.5–5.1)
Sodium: 149 mmol/L — ABNORMAL HIGH (ref 135–145)

## 2020-01-29 LAB — GLUCOSE, CAPILLARY
Glucose-Capillary: 158 mg/dL — ABNORMAL HIGH (ref 70–99)
Glucose-Capillary: 132 mg/dL — ABNORMAL HIGH (ref 70–99)
Glucose-Capillary: 151 mg/dL — ABNORMAL HIGH (ref 70–99)
Glucose-Capillary: 122 mg/dL — ABNORMAL HIGH (ref 70–99)
Glucose-Capillary: 89 mg/dL (ref 70–99)

## 2020-01-29 LAB — CULTURE, BLOOD (ROUTINE X 2)
Culture: NO GROWTH
Culture: NO GROWTH

## 2020-01-29 LAB — MAGNESIUM: Magnesium: 1.9 mg/dL (ref 1.7–2.4)

## 2020-01-29 MED ORDER — THIAMINE HCL 100 MG PO TABS
100.0000 mg | ORAL_TABLET | Freq: Every day | ORAL | Status: DC
  Administered 2020-01-29 – 2020-02-09 (×11): 100 mg
  Filled 2020-01-29 (×11): qty 1

## 2020-01-29 MED ORDER — ELDERTONIC PO LIQD
15.0000 mL | Freq: Every day | ORAL | Status: DC
  Administered 2020-01-29 – 2020-02-07 (×10): 15 mL
  Filled 2020-01-29 (×12): qty 15

## 2020-01-29 MED ORDER — POTASSIUM CHLORIDE 20 MEQ PO PACK
40.0000 meq | PACK | ORAL | Status: AC
  Administered 2020-01-29 (×2): 40 meq via ORAL
  Filled 2020-01-29 (×2): qty 2

## 2020-01-29 MED ORDER — CHOLESTYRAMINE 4 G PO PACK
4.0000 g | PACK | Freq: Three times a day (TID) | ORAL | Status: DC
  Administered 2020-01-29: 4 g via ORAL
  Filled 2020-01-29 (×5): qty 1

## 2020-01-29 MED ORDER — HYDROMORPHONE HCL 1 MG/ML IJ SOLN
1.0000 mg | INTRAMUSCULAR | Status: DC | PRN
  Administered 2020-01-29 – 2020-02-01 (×14): 1 mg via INTRAVENOUS
  Filled 2020-01-29 (×14): qty 1

## 2020-01-29 MED ORDER — LORAZEPAM 2 MG/ML IJ SOLN
0.2500 mg | Freq: Three times a day (TID) | INTRAMUSCULAR | Status: DC | PRN
  Administered 2020-01-29 – 2020-01-30 (×2): 0.25 mg via INTRAVENOUS
  Filled 2020-01-29 (×2): qty 1

## 2020-01-29 MED ORDER — ACETAMINOPHEN 325 MG PO TABS
650.0000 mg | ORAL_TABLET | Freq: Four times a day (QID) | ORAL | Status: DC
  Administered 2020-01-29: 650 mg via ORAL
  Filled 2020-01-29: qty 2

## 2020-01-29 MED ORDER — GERHARDT'S BUTT CREAM
TOPICAL_CREAM | CUTANEOUS | Status: DC | PRN
  Filled 2020-01-29 (×3): qty 1

## 2020-01-29 MED ORDER — OXYCODONE HCL 5 MG/5ML PO SOLN
5.0000 mg | ORAL | Status: DC | PRN
  Administered 2020-01-30 – 2020-02-01 (×6): 5 mg
  Filled 2020-01-29 (×7): qty 5

## 2020-01-29 MED ORDER — ACETAMINOPHEN 160 MG/5ML PO SOLN
650.0000 mg | Freq: Four times a day (QID) | ORAL | Status: DC
  Administered 2020-01-29 – 2020-02-08 (×31): 650 mg
  Filled 2020-01-29 (×32): qty 20.3

## 2020-01-29 MED ORDER — FREE WATER
350.0000 mL | Status: DC
  Administered 2020-01-29 – 2020-01-30 (×5): 350 mL

## 2020-01-29 MED ORDER — ONDANSETRON HCL 4 MG/2ML IJ SOLN
4.0000 mg | Freq: Four times a day (QID) | INTRAMUSCULAR | Status: DC | PRN
  Administered 2020-01-29 – 2020-02-01 (×2): 4 mg via INTRAVENOUS
  Filled 2020-01-29 (×2): qty 2

## 2020-01-29 NOTE — Progress Notes (Addendum)
Daily Progress Note   Patient Name: Natasha Jacobson       Date: 01/29/2020 DOB: Mar 14, 1951  Age: 69 y.o. MRN#: [id number] Attending Physician: Coralie Keens Primary Care Physician: Ardyth Gal, MD Admit Date: 01/13/2020  Reason for Consultation/Follow-up: Establishing goals of care, Non pain symptom management and Pain control     Subjective: Patient is in distress.  "I need help"  I asked what was wrong.  "My stomach hurts"  I asked if she needed pain medication and the patient replied "No, she said she would help me".  Discussed with RN.  Patient has been in distress all day.  Rectal tube in place with significant liquid stool.  Discussed pain control, anxiety control and stooling.    Attempted to call Natasha Jacobson again.  Left detailed message for call back.  Addendum:  Natasha Jacobson called back later in the day.  We talked about her mother being very weak and likely going to SNF rehab.  Natasha Jacobson lamented about her mother's health being up and down making it difficult to make decisions.  Natasha Jacobson states "I know what my mother would want - she's very independent - I just can't bring myself to say it". We discussed her mother's pain and diarrhea.  Assessment: Patient awake, expressing her needs.  Appears emotionally distressed and in pain.  WBC at 44K - leukemoid reaction?  c-diff negative.  Not on steroids.    Reviewed CT abdomen from 4/25:   Diffuse body wall and mesenteric edema, with trace upper abdominal ascites;  Small bilateral pleural effusions with bibasilar atelectasis; Sequela of chronic calcific pancreatitis.    Patient Profile/HPI:  69 y.o. female  with past medical history of COPD, chronic Hep C, chronic pancreatitis, anxiety, chronic pain, pancreatic gastrostomy stent exchanged on  4/8  admitted on 01/13/2020 with hematemesis, red bloody bowel movements, abdominal pain, hypotension and low Hgb. Clinically worsened on 4/10 requiring intubation. 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 Urine + e. Faecalis. Evidence of possible renal infarct on CT scan. She was extubated 4/11, reintubated 4/12, extubated 4/16, and reintubated 4/19. Now with pneumonia and septic shock requiring vasopressors. Hypoalbumin at 1.3. Transaminases at creatinine are both trending up. She has remained encephalopathic  during entire admission. Palliative medicine consulted for goals of care.    Length of Stay: 16   Vital Signs: BP (!) 157/69 (BP Location: Left Arm)   Pulse 71   Temp 98.7 F (37.1 C) (Axillary)   Resp (!) 28   Ht 5\' 4"  (1.626 m)   Wt 60 kg   SpO2 98%   BMI 22.71 kg/m  SpO2: SpO2: 98 % O2 Device: O2 Device: Room Air O2 Flow Rate: O2 Flow Rate (L/min): 2 L/min       Palliative Assessment/Data: 20%     Palliative Care Plan    Recommendations/Plan: Will initiate low dose lorazepam PRN for anxiety/distress Considered pancreatic enzymes for profuse diarrhea.  RN staff indicated that it clogs cor trak  Will trial Questran if no improvement in 24 - 36 hours would discontinue. Would consider discussing with nutrition if another type of tube feed would cause less diarrhea. Will not adjust pain regimen at this time she is on Dilaudid 1 mg PRN severe pain and Oxy IR 5 mg moderate pain.  I feel she likely needs a basal level of continuous pain relief rather than relying on PRNs but will not change things further today. Only daughter [place] of [place].  Nervous about making decisions for her mother.  Realizes her mother is a very independent person - who would not want to live dependent on others.  Code Status:   DNR  Prognosis: Poor long term prognosis.  She remains high risk for acute decline / death or rehospitalization.  Discharge Planning: [place] for rehab with Palliative care service follow-up   Thank you for allowing the Palliative Medicine Team to assist in the care of this patient.  Total time spent:  35 min.     Greater than 50%  of this time was spent counseling and coordinating care related to the above assessment and plan.  Florentina Jenny, PA-C Palliative Medicine  Please contact Palliative MedicineTeam phone at [phone] for questions and concerns between 7 am - 7 pm.   Please see AMION for individual provider pager numbers.

## 2020-01-29 NOTE — Plan of Care (Signed)
  Problem: Health Behavior/Discharge Planning: Goal: Ability to manage health-related needs will improve Outcome: Progressing   Problem: Clinical Measurements: Goal: Ability to maintain clinical measurements within normal limits will improve Outcome: Progressing Goal: Will remain free from infection Outcome: Progressing Goal: Respiratory complications will improve Outcome: Progressing Goal: Cardiovascular complication will be avoided Outcome: Progressing   Problem: Nutrition: Goal: Adequate nutrition will be maintained Outcome: Progressing

## 2020-01-29 NOTE — Progress Notes (Signed)
PROGRESS NOTE    Natasha Jacobson  LZJ:[id number] DOB: 1951/02/08 DOA: 01/13/2020 PCP: Ardyth Gal, MD    Brief Narrative:  Patient was admitted to the hospital with a working diagnosis of acute on chronic pancreatitis status,complicated by acute gastrointestinal bleed, due tosplenic artery pseudoaneurysm, now sp embolization. She required invasive mechanica ventilation and vasopressors.  68 year old female who presented with abdominal pain and hematemesis. She does have a significant past medical history for chronic alcoholic pancreatitis, she is status post exchange of pancreatico gastrostomy stent at [facility] on January 12, 2020. Patient had recurrent abdominal pain and hematemesis that prompted her to come back to the hospital. On her initial physical examination her blood pressure was 107/58, heart rate 85, respiratory rate 18, oxygen saturation 99%, her lungs are clear to auscultation bilaterally, heart S1-S2 present and rhythmic, her abdomen was tender to palpation in the epigastric region, no lower extremity edema. CT of the abdomen showed mild peripancreatic haziness suggesting acute on chronic pancreatitis. Positive pancreatic stent with pigtail ending in the second portion of the duodenum.  Patient developed hypotension and hemorrhagic shock, requiringmultiple PRBC transfusion and invasive mechanical ventilation. Further work-up with upper endoscopy showed active upper GI bleeding, follow-up CT of the abdomen showed 7 mm pseudoaneurysm over pancreatic branch of the splenic artery,likely culprit of hemorrhage. Interventional radiology performed a splenic arteriogram and coil embolization of pseudoaneurysm.  Patient failed extubation April 11, had to be reintubated April 12 due to increased work of breathing and respiratory distress.  Patient was stabilized and liberated from mechanical ventilation April 16, butpatient developed worsening hypoxemic and hypercapnic  respiratory failure and had to be reintubated April 19.Diagnosed with left-sided pneumonia and placed onantibiotic therapy.  Patient was liberated from mechanical ventilation April 22.  Transferred to [facility] 04/23.  Patient with persistent leukocytosis, very weak and deconditioned. Follow abdominal CT with no contrast with no acute changes. Patient with persistent abdominal pain.    Assessment & Plan:   Principal Problem:   Pancreatitis, acute Active Problems:   COPD (chronic obstructive pulmonary disease) (HCC)   Hypothyroidism   Essential hypertension   Acute pancreatitis   Gastrointestinal hemorrhage   Acute respiratory failure (HCC)   AKI (acute kidney injury) (HCC)   Anemia   Hemorrhagic shock (HCC)   Acute encephalopathy   HAP (hospital-acquired pneumonia)   Septic shock (HCC)   Goals of care, counseling/discussion   Palliative care by specialist   DNR (do not resuscitate)   Advanced care planning/counseling discussion   Protein-calorie malnutrition, severe   1. Acute on chronic alcoholic pancreatitis, complicated with hemorrhagic shock (acutye blood loss anemia), due to upper GI bleed, splenic artery pseudoaneurysm (inside the gastric wall at the site of the stent crossing into the stomach). (presnt on admission) sp pancreatic gastroctomy stent at [facility] on January 12, 2020. Hgb today is 9,9 with no signs of further bleeding. On tube feedings per NG with good toleration. Follow up CT with no IV contrast negative for acute changes. Continue with have high leukocytosis up to 44.1. C diff negative.    Will continue supportive medical care, will change morphine to hydromorphone and will increase frequency of oxycodone to as needed every 4 H. Add scheduled acetaminophen.   2. Acute hypoxic respiratory failure due to left lower lobe aspiration pneumonia. Patient now off mechanical ventilation, her respiratory cultures were positive for MSSA and enterobacter. Completed Linezolid  therapy. Follow chest film today with bilateral pleural effusions, no new infiltrates, personally reviewed.   Tolerating  well cefepime #7/8. Continue with bronchodilator therapy and aspiration precautions.Pending swallow evaluation per speech therapy.   Patient continue to have high risk for worsening respiratory failure.  3. AKI with hypernatremia/ hypokalemia/ hyperchloremia/ hyperchloremic non gap metabolic acidosis. Serum cr down to 1,47, K is 2,7 and serum bicarbonate at 20, Chloride 120. Patient continue to be very dry, with rising Na up to 149 and Mg 1.9. Patient with persistent diarrhea.   Hold on IV saline (10 ml per H), will increase free water flushes to 350 ml Q 6 H, Kcl 80 meq in 2 divided doses, will continue close follow up on renal function and electrolytes including Mg.   4. Acute metabolic encephalopathy. Patient is awake and alert, responding to questions and following commands, continue to have bilateral mittens to protect tubes and lines.   5. Severe calorie protein malnutrition. continue with nutritional supplementation per tube feedings. Will add thiamine and multivitamins.   6. Hypothyroid. Continue with levothyroxine  7. T2DM. Fasting glucose this am 128. Continue glucose cover and monitoring with insulin sliding scale.  DVT prophylaxis:scd Code Status:dnr Family Communication:No family at the bedside 3 Disposition Plan: Patient is from:Home Anticipated DC to:SNF Anticipated DC date:To be determined Anticipated DC barriers:Patient continue critically ill, continue on vasopressors and IV antibiotic therapy, need very close hemodynamic monitoring.     Nutrition Status: Nutrition Problem: Severe Malnutrition Etiology: chronic illness(chronic pancreatitis) Signs/Symptoms: severe fat  depletion, severe muscle depletion Interventions: Tube feeding, Prostat     Interventions: Tube feeding, Prostat     Antimicrobials:   Cefepime     Subjective: Patient continue to be very weak and deconditioned, this am with back pain and tachypnea, no nausea or vomiting, tolerating tube feedings well.   Objective: Vitals:   01/29/20 0300 01/29/20 0400 01/29/20 0802 01/29/20 0806  BP: (!) 146/89 (!) 162/86  (!) 194/96  Pulse: 86 86 78 86  Resp: (!) 26 (!) 32 (!) 29 (!) 38  Temp: (!) 96.8 F (36 C)   (!) 97.4 F (36.3 C)  TempSrc: Axillary   Axillary  SpO2: 96% 100% 95% 95%  Weight: 60 kg     Height:        Intake/Output Summary (Last 24 hours) at 01/29/2020 9470 Last data filed at 01/29/2020 0400 Gross per 24 hour  Intake 1594.93 ml  Output 1120 ml  Net 474.93 ml   Filed Weights   01/27/20 0453 01/28/20 0129 01/29/20 0300  Weight: 62.6 kg 60.4 kg 60 kg    Examination:   General: Not in pain or dyspnea, deconditioned  Neurology: Awake and alert, non focal  E ENT: mild pallor, no icterus, oral mucosa moist Cardiovascular: No JVD. S1-S2 present, rhythmic, no gallops, rubs, or murmurs. No lower extremity edema. Pulmonary: vesicular breath sounds bilaterally, adequate air movement, no wheezing, rhonchi or rales. Gastrointestinal. Abdomen withj, no organomegaly, mild to moderate tender to palpation, no rebound or guarding Skin. No rashes Musculoskeletal: no joint deformities     Data Reviewed: I have personally reviewed following labs and imaging studies  CBC: Recent Labs  Lab 01/25/20 0445 01/25/20 0816 01/26/20 0430 01/26/20 0740 01/27/20 1200 01/28/20 0420 01/29/20 0500  WBC 36.8*  --  33.0*  --  41.8* 43.1* 44.1*  NEUTROABS 32.8*  --   --   --  41.8* 38.1* 36.5*  HGB 9.7*   < > 9.3* 8.8* 8.9* 9.6* 9.9*  HCT 31.9*   < > 30.3* 26.0* 27.7* 29.4* 30.3*  MCV 100.0  --  99.3  --  95.2 95.5 93.8  PLT 396  --  306  --  275 305 309   < > = values in this  interval not displayed.   Basic Metabolic Panel: Recent Labs  Lab 01/24/20 0202 01/24/20 0341 01/25/20 0445 01/25/20 0816 01/26/20 0430 01/26/20 0740 01/27/20 0549 01/27/20 1200 01/28/20 0420 01/29/20 0500  NA 153*   < > 149*   < > 142 143  --  145 148* 149*  K 4.3   < > 4.3   < > 3.4* 3.5  --  2.4* 2.7* 2.7*  CL 121*   < > 121*  --  113*  --   --  117* 118* 120*  CO2 22   < > 18*  --  20*  --   --  19* 21* 20*  GLUCOSE 252*   < > 273*  --  241*  --   --  143* 168* 128*  BUN 98*   < > 118*  --  105*  --   --  82* 67* 51*  CREATININE 2.31*   < > 3.24*  --  2.48*  --   --  1.98* 1.87* 1.47*  CALCIUM 7.1*   < > 7.5*  --  7.4*  --   --  7.2* 7.4* 7.4*  MG 2.2  --  2.4  --  2.3  --  2.0  --   --  1.9  PHOS 6.8*  --   --   --   --   --   --   --   --   --    < > = values in this interval not displayed.   GFR: Estimated Creatinine Clearance: 32.5 mL/min (A) (by C-G formula based on SCr of 1.47 mg/dL (H)). Liver Function Tests: Recent Labs  Lab 01/23/20 0520 01/24/20 0202 01/24/20 0952 01/25/20 0445 01/26/20 0430  AST 36  --  59* 118* 96*  ALT 60*  --  55* 76* 85*  ALKPHOS 98  --  87 92 86  BILITOT 1.1  --  1.1 0.7 0.8  PROT 4.7*  --  4.6* 4.6* 4.3*  ALBUMIN 1.5* 1.3* 1.3* 1.3* 1.2*   No results for input(s): LIPASE, AMYLASE in the last 168 hours. Recent Labs  Lab 01/22/20 1028 01/25/20 0445  AMMONIA 44* 37*   Coagulation Profile: No results for input(s): INR, PROTIME in the last 168 hours. Cardiac Enzymes: No results for input(s): CKTOTAL, CKMB, CKMBINDEX, TROPONINI in the last 168 hours. BNP (last 3 results) No results for input(s): PROBNP in the last 8760 hours. HbA1C: No results for input(s): HGBA1C in the last 72 hours. CBG: Recent Labs  Lab 01/28/20 1529 01/28/20 1938 01/28/20 2313 01/29/20 0256 01/29/20 0739  GLUCAP 145* 172* 164* 158* 132*   Lipid Profile: No results for input(s): CHOL, HDL, LDLCALC, TRIG, CHOLHDL, LDLDIRECT in the last 72  hours. Thyroid Function Tests: No results for input(s): TSH, T4TOTAL, FREET4, T3FREE, THYROIDAB in the last 72 hours. Anemia Panel: No results for input(s): VITAMINB12, FOLATE, FERRITIN, TIBC, IRON, RETICCTPCT in the last 72 hours.    Radiology Studies: I have reviewed all of the imaging during this hospital visit personally     Scheduled Meds: . Chlorhexidine Gluconate Cloth  6 each Topical Daily  . feeding supplement (PRO-STAT SUGAR FREE 64)  30 mL Per Tube Daily  . free water  300 mL Per Tube Q4H  . insulin aspart  0-6 Units Subcutaneous Q4H  .  levothyroxine  50 mcg Per Tube Q0600  . oxyCODONE  5 mg Per Tube Q6H  . pantoprazole sodium  40 mg Per Tube BID  . potassium chloride  40 mEq Oral Q4H  . sodium chloride flush  10-40 mL Intracatheter Q12H  . sodium chloride flush  10-40 mL Intracatheter Q12H   Continuous Infusions: . sodium chloride 250 mL (01/28/20 1814)  . ceFEPime (MAXIPIME) IV 2 g (01/28/20 2307)  . feeding supplement (VITAL 1.5 CAL) 45 mL/hr at 01/29/20 0400     LOS: 16 days        Lige Lakeman Gerome Apley, MD

## 2020-01-29 NOTE — Progress Notes (Signed)
Bodenheimer advised of K+ 2.7 this morning. Waiting for orders. Will continue to monitor closely.

## 2020-01-29 NOTE — Plan of Care (Signed)
  Problem: Pain Managment: Goal: General experience of comfort will improve Outcome: Progressing   Problem: Activity: Goal: Ability to tolerate increased activity will improve Outcome: Progressing   Problem: Respiratory: Goal: Ability to maintain a clear airway and adequate ventilation will improve Outcome: Progressing   

## 2020-01-30 ENCOUNTER — Inpatient Hospital Stay (HOSPITAL_COMMUNITY): Payer: Medicare Other

## 2020-01-30 DIAGNOSIS — N179 Acute kidney failure, unspecified: Secondary | ICD-10-CM | POA: Diagnosis not present

## 2020-01-30 DIAGNOSIS — E43 Unspecified severe protein-calorie malnutrition: Secondary | ICD-10-CM

## 2020-01-30 DIAGNOSIS — K852 Alcohol induced acute pancreatitis without necrosis or infection: Secondary | ICD-10-CM | POA: Diagnosis not present

## 2020-01-30 DIAGNOSIS — Z7189 Other specified counseling: Secondary | ICD-10-CM | POA: Diagnosis not present

## 2020-01-30 DIAGNOSIS — G9340 Encephalopathy, unspecified: Secondary | ICD-10-CM | POA: Diagnosis not present

## 2020-01-30 DIAGNOSIS — K859 Acute pancreatitis without necrosis or infection, unspecified: Secondary | ICD-10-CM | POA: Diagnosis not present

## 2020-01-30 LAB — ALBUMIN: Albumin: 1.2 g/dL — ABNORMAL LOW (ref 3.5–5.0)

## 2020-01-30 LAB — BASIC METABOLIC PANEL
Anion gap: 7 (ref 5–15)
BUN: 46 mg/dL — ABNORMAL HIGH (ref 8–23)
CO2: 20 mmol/L — ABNORMAL LOW (ref 22–32)
Calcium: 7.2 mg/dL — ABNORMAL LOW (ref 8.9–10.3)
Chloride: 122 mmol/L — ABNORMAL HIGH (ref 98–111)
Creatinine, Ser: 1.28 mg/dL — ABNORMAL HIGH (ref 0.44–1.00)
GFR calc Af Amer: 50 mL/min — ABNORMAL LOW (ref >60)
GFR calc non Af Amer: 44 mL/min — ABNORMAL LOW (ref >60)
Glucose, Bld: 156 mg/dL — ABNORMAL HIGH (ref 70–99)
Potassium: 3.4 mmol/L — ABNORMAL LOW (ref 3.5–5.1)
Sodium: 149 mmol/L — ABNORMAL HIGH (ref 135–145)

## 2020-01-30 LAB — CBC WITH DIFFERENTIAL/PLATELET
Abs Immature Granulocytes: 3.24 10*3/uL — ABNORMAL HIGH (ref 0.00–0.07)
Basophils Absolute: 0 10*3/uL (ref 0.0–0.1)
Basophils Relative: 0 %
Eosinophils Absolute: 0.2 10*3/uL (ref 0.0–0.5)
Eosinophils Relative: 0 %
HCT: 29.2 % — ABNORMAL LOW (ref 36.0–46.0)
Hemoglobin: 9.4 g/dL — ABNORMAL LOW (ref 12.0–15.0)
Immature Granulocytes: 7 %
Lymphocytes Relative: 3 %
Lymphs Abs: 1.4 10*3/uL (ref 0.7–4.0)
MCH: 30.3 pg (ref 26.0–34.0)
MCHC: 32.2 g/dL (ref 30.0–36.0)
MCV: 94.2 fL (ref 80.0–100.0)
Monocytes Absolute: 1.1 10*3/uL — ABNORMAL HIGH (ref 0.1–1.0)
Monocytes Relative: 3 %
Neutro Abs: 38.6 10*3/uL — ABNORMAL HIGH (ref 1.7–7.7)
Neutrophils Relative %: 87 %
Platelets: 261 10*3/uL (ref 150–400)
RBC: 3.1 MIL/uL — ABNORMAL LOW (ref 3.87–5.11)
RDW: 18.7 % — ABNORMAL HIGH (ref 11.5–15.5)
WBC: 44.6 10*3/uL — ABNORMAL HIGH (ref 4.0–10.5)
nRBC: 1.9 % — ABNORMAL HIGH (ref 0.0–0.2)

## 2020-01-30 LAB — GLUCOSE, CAPILLARY
Glucose-Capillary: 119 mg/dL — ABNORMAL HIGH (ref 70–99)
Glucose-Capillary: 120 mg/dL — ABNORMAL HIGH (ref 70–99)
Glucose-Capillary: 135 mg/dL — ABNORMAL HIGH (ref 70–99)
Glucose-Capillary: 136 mg/dL — ABNORMAL HIGH (ref 70–99)
Glucose-Capillary: 144 mg/dL — ABNORMAL HIGH (ref 70–99)
Glucose-Capillary: 147 mg/dL — ABNORMAL HIGH (ref 70–99)
Glucose-Capillary: 173 mg/dL — ABNORMAL HIGH (ref 70–99)

## 2020-01-30 LAB — MAGNESIUM: Magnesium: 1.7 mg/dL (ref 1.7–2.4)

## 2020-01-30 MED ORDER — POTASSIUM CHLORIDE 20 MEQ PO PACK
40.0000 meq | PACK | ORAL | Status: AC
  Administered 2020-01-30 (×2): 40 meq via ORAL
  Filled 2020-01-30 (×2): qty 2

## 2020-01-30 MED ORDER — FREE WATER
400.0000 mL | Status: DC
  Administered 2020-01-30 – 2020-01-31 (×6): 400 mL

## 2020-01-30 MED ORDER — MAGNESIUM SULFATE 2 GM/50ML IV SOLN
2.0000 g | Freq: Once | INTRAVENOUS | Status: AC
  Administered 2020-01-30: 2 g via INTRAVENOUS
  Filled 2020-01-30: qty 50

## 2020-01-30 NOTE — Progress Notes (Signed)
  Speech Language Pathology Treatment: Dysphagia  Patient Details Name: Natasha Jacobson MRN: [id number] DOB: 27-Jun-1951 Today's Date: 01/30/2020 Time: 7001-7494 SLP Time Calculation (min) (ACUTE ONLY): 14 min  Assessment / Plan / Recommendation Clinical Impression  Natasha Jacobson seen for trials of PO to determine readiness to start diet and pull Cortrak. Natasha Jacobson more alert, tongue and lips in better condition than last week per notes. Natasha Jacobson breathing is labored and she verbalizes pain, RN informed. Though no immediate signs of aspiration were observed with sips of water or puree, Natasha Jacobson has increased risk of aspiration without sensation given multiple intubations and subjective hoarse vocal quality. Discussed with MD who agrees with instrumental testing prior to diet intubation to determine safety with PO. Will plan for MBS today at 11:30.    HPI HPI: Natasha Jacobson is a 69 yo female admitted with hemorrhagic shock 2/2 bleeding 67mm pseudoaneurysm and recent instrumentation s/p EGD and coil embolization by IR 4/10. Course also significant for UTI, acute on chronic pancreatitis, and ETT 4/10-4/11; reintubated for encephalopathy and hypoxia 4/12-4/16. BSE 4/17 recommended NPO. Natasha Jacobson reintubated 4/19-4/22 due to respiratory distress. PMH also includes: COPD, hearing loss, HTN, thyroid disease.      SLP Plan  MBS       Recommendations  Diet recommendations: NPO                Plan: MBS       GO               Harlon Ditty, MA CCC-SLP  Acute Rehabilitation Services Pager [phone] Office [phone]  [name] 01/30/2020, 8:51 AM

## 2020-01-30 NOTE — Progress Notes (Signed)
Modified Barium Swallow Progress Note  Patient Details  Name: Natasha Jacobson MRN: [id number] Date of Birth: April 06, 1951  Today's Date: 01/30/2020  Modified Barium Swallow completed.  Full report located under Chart Review in the Imaging Section.  Brief recommendations include the following:  Clinical Impression  Pt presents as very restless and poorly participatory during MBS. Needed max cues by end of session and instances of oral holding were prevalent throughout. Suspect that pts pain and anxiety impacted her breathing pattern and PO trials contributed to her respiratory fatigue. Pt held bolus for several seconds at time while trying to catch her breath with occasional piecemealing of overlarge boluses. Though there were instances of premature spillage, swallow initaition was timely and strong. Primary problem was mild oral/base of tongue residue that spilled to vestibule and settled on vocal folds post swallow without sensation with large sips of thin liquids. Pt did improve with smaller bolus size via cup, but further strategies were not attempted as pt became increasingly restless. She tolerated nectar well but never swallowed her puree bolus. Recommed initiating a dys 1 (puree)/nectar thick liquid diet with potential to upgrade if endurance improves. Will follow for futher trials.    Swallow Evaluation Recommendations       SLP Diet Recommendations: Dysphagia 1 (Puree) solids;Nectar thick liquid   Liquid Administration via: Cup;Straw   Medication Administration: Crushed with puree   Supervision: Full assist for feeding   Compensations: Clear throat intermittently                Chloe Miyoshi, Riley Nearing 01/30/2020,1:55 PM

## 2020-01-30 NOTE — Progress Notes (Addendum)
Physical Therapy Treatment Patient Details Name: Natasha Jacobson MRN: [id number] DOB: 1951/02/21 Today's Date: 01/30/2020    History of Present Illness Pt is a 69 yo female admitted with hemorrhagic shock 2/2 bleeding 77mm pseudoaneurysm and recent instrumentation s/p EGD and coil embolization by IR 4/10. Course also significant for UTI, acute on chronic pancreatitis, and ETT 4/10-4/11; reintubated for encephalopathy and hypoxia 4/12-4/16. PMH also includes: COPD, hearing loss, HTN, thyroid disease.  Reintubated 4/19 and extubated again 4/23.    PT Comments    Pt supine on arrival with RN present. Pt not oriented to place but aware of month and year, not day. Pt reports nausea with sitting and declined attempting OOB to chair reporting fatigue and pain. Pt with limited ability to assist with mobility with deficits in cognition, strength and function. Will continue to follow with SNF appropriate.  BP 131/101 HR 88 SpO2 100% on RA    Follow Up Recommendations  SNF;Supervision/Assistance - 24 hour     Equipment Recommendations  Other (comment)(defer to next venue)    Recommendations for Other Services       Precautions / Restrictions Precautions Precautions: Fall Precaution Comments: cortrak, flexiseal, bil mittens    Mobility  Bed Mobility Overal bed mobility: Needs Assistance Bed Mobility: Supine to Sit;Sit to Supine     Supine to sit: Max assist Sit to supine: Mod assist   General bed mobility comments: max assist to bring legs to EOB and elevate trunk with increased time. pt maintains flexed trunk EOB. Pt with periodic eye rolling with report of nausea with no emesis and all VSS. Return to supine pt able to bring legs to surface with assist for positioning and total assist to slide toward [facility]  Transfers Overall transfer level: Needs assistance   Transfers: Sit to/from Stand Sit to Stand: Max assist         General transfer comment: attempt to stand x 3 from EOB with  use of pad and bil knees blocked. initial trial but would not rise repeated trials pt with rise from surface but maintained crouched posture and total assist for partial scoot toward HOB with weight on bil feet  Ambulation/Gait             General Gait Details: unable   Stairs             Wheelchair Mobility    Modified Rankin (Stroke Patients Only)       Balance Overall balance assessment: Needs assistance Sitting-balance support: Bilateral upper extremity supported;Feet supported Sitting balance-Leahy Scale: Fair       Standing balance-Leahy Scale: Zero                              Cognition Arousal/Alertness: Awake/alert Behavior During Therapy: Restless Overall Cognitive Status: Impaired/Different from baseline Area of Impairment: Following commands;Safety/judgement;Awareness;Problem solving;Attention;Orientation;Memory                 Orientation Level: Disoriented to;Time Current Attention Level: Sustained Memory: Decreased short-term memory Following Commands: Follows one step commands inconsistently;Follows one step commands with increased time Safety/Judgement: Decreased awareness of safety;Decreased awareness of deficits   Problem Solving: Slow processing General Comments: pt not oriented to time or situation with limited adherence to cues throughout session. Pt internally distracted by nausea      Exercises General Exercises - Lower Extremity Short Arc Quad: AAROM;Both;Seated;10 reps Hip Flexion/Marching: AAROM;Both;Seated;10 reps    General Comments  Pertinent Vitals/Pain Pain Score: 5  Faces Pain Scale: Hurts even more Pain Location: chest Pain Descriptors / Indicators: Grimacing Pain Intervention(s): Limited activity within patient's tolerance;Monitored during session;Repositioned    Home Living                      Prior Function            PT Goals (current goals can now be found in the care  plan section) Acute Rehab PT Goals Time For Goal Achievement: 02/13/20 Potential to Achieve Goals: Fair Progress towards PT goals: Not progressing toward goals - comment;Goals downgraded-see care plan    Frequency    Min 2X/week      PT Plan Current plan remains appropriate;Frequency needs to be updated    Co-evaluation              AM-PAC PT "6 Clicks" Mobility   Outcome Measure  Help needed turning from your back to your side while in a flat bed without using bedrails?: Total Help needed moving from lying on your back to sitting on the side of a flat bed without using bedrails?: Total Help needed moving to and from a bed to a chair (including a wheelchair)?: Total Help needed standing up from a chair using your arms (e.g., wheelchair or bedside chair)?: Total Help needed to walk in hospital room?: Total Help needed climbing 3-5 steps with a railing? : Total 6 Click Score: 6    End of Session Equipment Utilized During Treatment: Gait belt Activity Tolerance: Patient tolerated treatment well Patient left: in bed;with call bell/phone within reach;with bed alarm set;Other (comment)(mittens reapplied) Nurse Communication: Mobility status;Need for lift equipment PT Visit Diagnosis: Other abnormalities of gait and mobility (R26.89);Muscle weakness (generalized) (M62.81);Pain     Time: 7412-8786 PT Time Calculation (min) (ACUTE ONLY): 27 min  Charges:  $Therapeutic Activity: 23-37 mins                     Andrewjames Weirauch P, PT Acute Rehabilitation Services Pager: [phone] Office: [phone]    Fouad Taul B Anahita Cua 01/30/2020, 1:03 PM

## 2020-01-30 NOTE — Progress Notes (Signed)
Daily Progress Note   Patient Name: Natasha Jacobson       Date: 01/30/2020 DOB: 03/10/1951  Age: 69 y.o. MRN#: [id number] Attending Physician: Coralie Keens Primary Care Physician: Ardyth Gal, MD Admit Date: 01/13/2020  Reason for Consultation/Follow-up: Establishing goals of care  Subjective: Patient awake, grimaces, then drifts quickly back to sleep. She does not answer my questions- however, per report she was able to answer person, place and time this morning. Scheduled for MBS today.  Spoke with Natasha Jacobson- gave update. Natasha Jacobson continues to be hopeful that patient will continue to recover and discharge to SNF.   Review of Systems  Unable to perform ROS: Mental status change    Length of Stay: 17  Current Medications: Scheduled Meds:  . acetaminophen (TYLENOL) oral liquid 160 mg/5 mL  650 mg Per Tube Q6H  . Chlorhexidine Gluconate Cloth  6 each Topical Daily  . feeding supplement (PRO-STAT SUGAR FREE 64)  30 mL Per Tube Daily  . free water  400 mL Per Tube Q4H  . geriatric multivitamins-minerals  15 mL Per Tube Daily  . insulin aspart  0-6 Units Subcutaneous Q4H  . levothyroxine  50 mcg Per Tube Q0600  . pantoprazole sodium  40 mg Per Tube BID  . potassium chloride  40 mEq Oral Q4H  . sodium chloride flush  10-40 mL Intracatheter Q12H  . thiamine  100 mg Per Tube Daily    Continuous Infusions: . ceFEPime (MAXIPIME) IV 200 mL/hr at 01/30/20 0114  . feeding supplement (VITAL 1.5 CAL) 1,000 mL (01/30/20 0424)    PRN Meds: albuterol, Gerhardt's butt cream, hydrALAZINE, HYDROmorphone (DILAUDID) injection, ipratropium-albuterol, LORazepam, ondansetron (ZOFRAN) IV, oxyCODONE, sodium chloride flush  Physical Exam Vitals and nursing note reviewed.  Constitutional:       Appearance: She is ill-appearing.  Cardiovascular:     Rate and Rhythm: Normal rate.  Pulmonary:     Effort: Pulmonary effort is normal.  Abdominal:     General: There is distension.  Neurological:     Comments: Lethargic, oriented per nursing report this morning             Vital Signs: BP 121/81 (BP Location: Left Wrist)   Pulse 86   Temp (!) 97.2 F (36.2 C) (Axillary)   Resp (!) 27   Ht 5\' 4"  (  1.626 m)   Wt 62.1 kg   SpO2 100%   BMI 23.50 kg/m  SpO2: SpO2: 100 % O2 Device: O2 Device: Room Air O2 Flow Rate: O2 Flow Rate (L/min): 2 L/min  Intake/output summary:   Intake/Output Summary (Last 24 hours) at 01/30/2020 1253 Last data filed at 01/30/2020 0800 Gross per 24 hour  Intake 2418 ml  Output 1675 ml  Net 743 ml   LBM: Last BM Date: 01/29/20(flexi-seal intact) Baseline Weight: Weight: 50.7 kg Most recent weight: Weight: 62.1 kg       Palliative Assessment/Data: PPS: 10%      Patient Active Problem List   Diagnosis Date Noted  . Acute respiratory distress   . Palliative care encounter   . Protein-calorie malnutrition, severe 01/27/2020  . HAP (hospital-acquired pneumonia)   . Septic shock ([place])   . Goals of care, counseling/discussion   . Palliative care by specialist   . DNR (do not resuscitate)   . Advanced care planning/counseling discussion   . Acute encephalopathy   . Hemorrhagic shock ([place])   . AKI (acute kidney injury) ([place])   . Anemia   . Essential hypertension 01/14/2020  . Acute pancreatitis 01/14/2020  . Gastrointestinal hemorrhage   . Acute respiratory failure ([place])   . Pancreatitis, acute 01/13/2020  . Generalized anxiety disorder 12/20/2019  . Alcohol-induced chronic pancreatitis ([place]) 11/17/2019  . 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 Chronic pain syndrome 08/02/2018  . Chronic hepatitis C without hepatic coma ([place]) 08/18/2017  . Hypothyroidism 07/08/2016  . Hearing loss, right 07/08/2016  . COPD (chronic obstructive  pulmonary disease) ([place]) 09/06/2015  . SSS (sick sinus syndrome) ([place]) 09/06/2015    Palliative Care Assessment & Plan   Patient Profile: 69 y.o.femalewith past medical history of COPD,chronic Hep C,chronic pancreatitis, anxiety, chronic pain,pancreatic gastrostomy stent exchanged on 4/8admitted on 4/9/2021with hematemesis, red bloody bowel movements, abdominal pain, hypotension and low Hgb. Clinically worsened on 4/10 requiring intubation. 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 Urine + e. Faecalis. Evidence of possible renal infarct on CT scan. She was extubated 4/11, reintubated 4/12, extubated 4/16, and reintubated 4/19. Now with pneumonia and septic shock requiring vasopressors. Hypoalbumin at 1.3. Transaminases at creatinine are both trending up. She has remained encephalopathic during entire admission. Palliative medicine consulted for goals of care.  Assessment/Recommendations/Plan  Cr improving, mentation improving- however, remains severely ill.  ?some hepatic encephalopathy- last ammonia on 4/21 was slightly elevated at 37 (down from 44)- calculated MELD with most recent numbers is 27 GOC remain to continue aggressive care- DNR in the event of cardiac/respiratory arrest- however, proceed with intubation if needed to prevent arrest D/C questran due to clogging PEG tube- will restart if she is able to take PO after MBS today  Goals of Care and Additional Recommendations: Limitations on Scope of Treatment: Full Scope Treatment  Code Status: DNR  Prognosis:  Unable to determine  Discharge Planning: To Be Determined  Care plan was discussed with daughter- Natasha Jacobson  Thank you for allowing the Palliative Medicine Team to assist in the care of this patient.   Time In: 1000 Time Out: 1035 Total Time 35 mins Prolonged  Time Billed no      Greater than 50%  of this time was spent counseling and coordinating care related to the above assessment and plan.  Mariana Kaufman, AGNP-C Palliative Medicine   Please contact  Palliative Medicine Team phone at [phone] for questions and concerns.

## 2020-01-30 NOTE — Progress Notes (Signed)
PROGRESS NOTE    Natasha Jacobson  ZOX:[id number] DOB: Aug 13, 1951 DOA: 01/13/2020 PCP: Ardyth Gal, MD    Brief Narrative:  Patient was admitted to the hospital with a working diagnosis of acute on chronic pancreatitis status,complicated by acute gastrointestinal bleed, due tosplenic artery pseudoaneurysm, now sp embolization. She required invasive mechanica ventilation and vasopressors.  69 year old female who presented with abdominal pain and hematemesis. She does have a significant past medical history for chronic alcoholic pancreatitis, she is status post exchange of pancreatico gastrostomy stent at [facility] on January 12, 2020. Patient had recurrent abdominal pain and hematemesis that prompted her to come back to the hospital. On her initial physical examination her blood pressure was 107/58, heart rate 85, respiratory rate 18, oxygen saturation 99%, her lungs are clear to auscultation bilaterally, heart S1-S2 present and rhythmic, her abdomen was tender to palpation in the epigastric region, no lower extremity edema. CT of the abdomen showed mild peripancreatic haziness suggesting acute on chronic pancreatitis. Positive pancreatic stent with pigtail ending in the second portion of the duodenum.  Patient developed hypotension and hemorrhagic shock, requiringmultiple PRBC transfusion and invasive mechanical ventilation. Further work-up with upper endoscopy showed active upper GI bleeding, follow-up CT of the abdomen showed 7 mm pseudoaneurysm over pancreatic branch of the splenic artery,likely culprit of hemorrhage. Interventional radiology performed a splenic arteriogram and coil embolization of pseudoaneurysm.  Patient failed extubation April 11, had to be reintubated April 12 due to increased work of breathing and respiratory distress.  Patient was stabilized and liberated from mechanical ventilation April 16, butpatient developed worsening hypoxemic and hypercapnic  respiratory failure and had to be reintubated April 19.Diagnosed with left-sided pneumonia and placed onantibiotic therapy.  Patient was liberated from mechanical ventilation April 22.  Transferred to [facility] 04/23.  Patient with persistent leukocytosis, very weak and deconditioned. Follow abdominal CT with no contrast with no acute changes. Patient with persistent abdominal pain.     Assessment & Plan:   Principal Problem:   Pancreatitis, acute Active Problems:   COPD (chronic obstructive pulmonary disease) (HCC)   Hypothyroidism   Essential hypertension   Acute pancreatitis   Gastrointestinal hemorrhage   Acute respiratory failure (HCC)   AKI (acute kidney injury) (HCC)   Anemia   Hemorrhagic shock (HCC)   Acute encephalopathy   HAP (hospital-acquired pneumonia)   Septic shock (HCC)   Goals of care, counseling/discussion   Palliative care by specialist   DNR (do not resuscitate)   Advanced care planning/counseling discussion   Protein-calorie malnutrition, severe   Acute respiratory distress   Palliative care encounter    1. Acuteon chronic alcoholicpancreatitis, complicated with hemorrhagic shock (acute blood loss anemia), due to upper GI bleed, splenic artery pseudoaneurysm(inside the gastric wall at the site of the stent crossing into the stomach). (presnt on admission)sp pancreatic gastroctomy stent at [facility] on January 12, 2020.   SP pseudoaneuryms embolization 04/10.   Patient continue with good toleration of  tube feedings, improved pain control with IV hydromorphone. No clinical signs of recurrent bleeding. Continue with scheduled acetaminophen. Pending speech evaluation. Wbc continue to be elevated at 44,6. Added cholestyramine.   2. Acute hypoxic respiratory failure due to left lower lobe aspiration pneumonia. Patient now off mechanical ventilation, her respiratory cultures were positive for MSSA and enterobacter. Completed Linezolid therapy.   She has  bilateral pleural effusions. To complete cefepime #8/8, today.  On bronchodilator therapy and aspiration precautions.Pending swallow evaluation per speech therapy.Patient now is do not intubate.  Patient continue to have high risk for worsening respiratory failure.  3. AKI with hypernatremia/ hypokalemia/ hyperchloremia/ hyperchloremic non gap metabolic acidosis.Serum cr today at 1,28, with K is 3,4  and serum bicarbonate at 20, Chloride 122, NA 149. Continue to have profuse diarrhea, D diff negative.   Patient with persistent thirst, will increase free water flushes to 400 ml Q 4 H, repeat Kcl 80 meq in 2 divided doses, add 2 g Mag sulfate this am. Follow on renal panel in am.    4. Acute metabolic encephalopathy. Patient continue to beawake and alert, not agitated, complains of abdominal pain and thirst.  5. Severe calorie protein malnutrition.On Nutritional supplementation per tube feedings. Continue thiamine and multivitamins.   6. Hypothyroid.On levothyroxine  7. T2DM (Hgb A1c 5,4) .Fasting glucose this am 156. Will continue glucose cover and monitoring with insulin sliding scale.  DVT prophylaxis:scd Code Status:dnr Family Communication:No family at the bedside 3 Disposition Plan: Patient is from:Home Anticipated DC to:SNF Anticipated DC date:To be determined Anticipated DC barriers:Patient continue critically ill, continue on vasopressors and IV antibiotic therapy, need very close hemodynamic monitoring.     Nutrition Status: Nutrition Problem: Severe Malnutrition Etiology: chronic illness(chronic pancreatitis) Signs/Symptoms: severe fat depletion, severe muscle depletion Interventions: Tube feeding, Prostat    Interventions: Tube feeding, Prostat     Antimicrobials:   Cefepime       Subjective: Patient more awake and alert, no nausea or vomiting, tolerating tube feedings well, continue to have abdominal pain and thirst.   Objective: Vitals:   01/29/20 2326 01/30/20 0000 01/30/20 0430 01/30/20 0431  BP: (!) 141/77 136/65  130/71  Pulse:  99  97  Resp:  (!) 35    Temp: 97.6 F (36.4 C)   97.6 F (36.4 C)  TempSrc: Oral   Oral  SpO2:  97%  97%  Weight:   62.1 kg   Height:        Intake/Output Summary (Last 24 hours) at 01/30/2020 0804 Last data filed at 01/30/2020 0424 Gross per 24 hour  Intake 2958 ml  Output 1675 ml  Net 1283 ml   Filed Weights   01/28/20 0129 01/29/20 0300 01/30/20 0430  Weight: 60.4 kg 60 kg 62.1 kg    Examination:   General: Not in pain or dyspnea, deconditioned and ill looking appearing  Neurology: Awake and alert, non focal. Limited communication. Some confusion but not agitation E ENT: mild pallor, no icterus, oral mucosa dry Cardiovascular: No JVD. S1-S2 present, rhythmic, no gallops, rubs, or murmurs. No lower extremity edema. Pulmonary: positive breath sounds bilaterally, adequate air movement, no wheezing, rhonchi or rales. Gastrointestinal. Abdomen with no organomegaly, mild tender to palpation, no rebound or guarding Skin. No rashes/ toes cyanosis bilaterally  Musculoskeletal: no joint deformities     Data Reviewed: I have personally reviewed following labs and imaging studies  CBC: Recent Labs  Lab 01/25/20 0445 01/25/20 0816 01/26/20 0430 01/26/20 0430 01/26/20 0740 01/27/20 1200 01/28/20 0420 01/29/20 0500 01/30/20 0306  WBC 36.8*   < > 33.0*  --   --  41.8* 43.1* 44.1* 44.6*  NEUTROABS 32.8*  --   --   --   --  41.8* 38.1* 36.5* 38.6*  HGB 9.7*   < > 9.3*   < > 8.8* 8.9* 9.6* 9.9* 9.4*  HCT 31.9*   < > 30.3*   < > 26.0* 27.7* 29.4* 30.3* 29.2*  MCV 100.0   < > 99.3  --   --  95.2 95.5  93.8 94.2  PLT 396   < > 306  --   --  275 305 309 261   < > = values in this interval not displayed.   Basic  Metabolic Panel: Recent Labs  Lab 01/24/20 0202 01/24/20 0341 01/25/20 0445 01/25/20 0816 01/26/20 0430 01/26/20 0430 01/26/20 0740 01/27/20 0549 01/27/20 1200 01/28/20 0420 01/29/20 0500 01/30/20 0306  NA 153*   < > 149*   < > 142   < > 143  --  145 148* 149* 149*  K 4.3   < > 4.3   < > 3.4*   < > 3.5  --  2.4* 2.7* 2.7* 3.4*  CL 121*   < > 121*   < > 113*  --   --   --  117* 118* 120* 122*  CO2 22   < > 18*   < > 20*  --   --   --  19* 21* 20* 20*  GLUCOSE 252*   < > 273*   < > 241*  --   --   --  143* 168* 128* 156*  BUN 98*   < > 118*   < > 105*  --   --   --  82* 67* 51* 46*  CREATININE 2.31*   < > 3.24*   < > 2.48*  --   --   --  1.98* 1.87* 1.47* 1.28*  CALCIUM 7.1*   < > 7.5*   < > 7.4*  --   --   --  7.2* 7.4* 7.4* 7.2*  MG 2.2   < > 2.4  --  2.3  --   --  2.0  --   --  1.9 1.7  PHOS 6.8*  --   --   --   --   --   --   --   --   --   --   --    < > = values in this interval not displayed.   GFR: Estimated Creatinine Clearance: 37.3 mL/min (A) (by C-G formula based on SCr of 1.28 mg/dL (H)). Liver Function Tests: Recent Labs  Lab 01/24/20 0202 01/24/20 0952 01/25/20 0445 01/26/20 0430  AST  --  59* 118* 96*  ALT  --  55* 76* 85*  ALKPHOS  --  87 92 86  BILITOT  --  1.1 0.7 0.8  PROT  --  4.6* 4.6* 4.3*  ALBUMIN 1.3* 1.3* 1.3* 1.2*   No results for input(s): LIPASE, AMYLASE in the last 168 hours. Recent Labs  Lab 01/25/20 0445  AMMONIA 37*   Coagulation Profile: No results for input(s): INR, PROTIME in the last 168 hours. Cardiac Enzymes: No results for input(s): CKTOTAL, CKMB, CKMBINDEX, TROPONINI in the last 168 hours. BNP (last 3 results) No results for input(s): PROBNP in the last 8760 hours. HbA1C: No results for input(s): HGBA1C in the last 72 hours. CBG: Recent Labs  Lab 01/29/20 1604 01/29/20 2027 01/29/20 2357 01/30/20 0443 01/30/20 0755  GLUCAP 122* 89 173* 135* 144*   Lipid Profile: No results for input(s): CHOL, HDL, LDLCALC,  TRIG, CHOLHDL, LDLDIRECT in the last 72 hours. Thyroid Function Tests: No results for input(s): TSH, T4TOTAL, FREET4, T3FREE, THYROIDAB in the last 72 hours. Anemia Panel: No results for input(s): VITAMINB12, FOLATE, FERRITIN, TIBC, IRON, RETICCTPCT in the last 72 hours.    Radiology Studies: I have reviewed all of the imaging during this hospital visit personally     Scheduled  Meds: . acetaminophen (TYLENOL) oral liquid 160 mg/5 mL  650 mg Per Tube Q6H  . Chlorhexidine Gluconate Cloth  6 each Topical Daily  . cholestyramine  4 g Oral TID  . feeding supplement (PRO-STAT SUGAR FREE 64)  30 mL Per Tube Daily  . free water  350 mL Per Tube Q4H  . geriatric multivitamins-minerals  15 mL Per Tube Daily  . insulin aspart  0-6 Units Subcutaneous Q4H  . levothyroxine  50 mcg Per Tube Q0600  . pantoprazole sodium  40 mg Per Tube BID  . sodium chloride flush  10-40 mL Intracatheter Q12H  . sodium chloride flush  10-40 mL Intracatheter Q12H  . thiamine  100 mg Per Tube Daily   Continuous Infusions: . ceFEPime (MAXIPIME) IV 200 mL/hr at 01/30/20 0114  . feeding supplement (VITAL 1.5 CAL) 1,000 mL (01/30/20 0424)     LOS: 17 days        Natasha Gilmartin Gerome Apley, MD

## 2020-01-31 DIAGNOSIS — K852 Alcohol induced acute pancreatitis without necrosis or infection: Secondary | ICD-10-CM | POA: Diagnosis not present

## 2020-01-31 DIAGNOSIS — R0603 Acute respiratory distress: Secondary | ICD-10-CM | POA: Diagnosis not present

## 2020-01-31 DIAGNOSIS — K859 Acute pancreatitis without necrosis or infection, unspecified: Secondary | ICD-10-CM | POA: Diagnosis not present

## 2020-01-31 DIAGNOSIS — G9340 Encephalopathy, unspecified: Secondary | ICD-10-CM | POA: Diagnosis not present

## 2020-01-31 LAB — GLUCOSE, CAPILLARY
Glucose-Capillary: 108 mg/dL — ABNORMAL HIGH (ref 70–99)
Glucose-Capillary: 38 mg/dL — CL (ref 70–99)
Glucose-Capillary: 88 mg/dL (ref 70–99)
Glucose-Capillary: 129 mg/dL — ABNORMAL HIGH (ref 70–99)
Glucose-Capillary: 62 mg/dL — ABNORMAL LOW (ref 70–99)
Glucose-Capillary: 72 mg/dL (ref 70–99)

## 2020-01-31 LAB — CBC WITH DIFFERENTIAL/PLATELET
Abs Immature Granulocytes: 3.2 10*3/uL — ABNORMAL HIGH (ref 0.00–0.07)
Basophils Absolute: 0.1 10*3/uL (ref 0.0–0.1)
Basophils Relative: 0 %
Eosinophils Absolute: 0.5 10*3/uL (ref 0.0–0.5)
Eosinophils Relative: 1 %
HCT: 28.6 % — ABNORMAL LOW (ref 36.0–46.0)
Hemoglobin: 8.8 g/dL — ABNORMAL LOW (ref 12.0–15.0)
Immature Granulocytes: 7 %
Lymphocytes Relative: 4 %
Lymphs Abs: 1.9 10*3/uL (ref 0.7–4.0)
MCH: 30.2 pg (ref 26.0–34.0)
MCHC: 30.8 g/dL (ref 30.0–36.0)
MCV: 98.3 fL (ref 80.0–100.0)
Monocytes Absolute: 1.9 10*3/uL — ABNORMAL HIGH (ref 0.1–1.0)
Monocytes Relative: 4 %
Neutro Abs: 37.2 10*3/uL — ABNORMAL HIGH (ref 1.7–7.7)
Neutrophils Relative %: 84 %
Platelets: 263 10*3/uL (ref 150–400)
RBC: 2.91 MIL/uL — ABNORMAL LOW (ref 3.87–5.11)
RDW: 18.9 % — ABNORMAL HIGH (ref 11.5–15.5)
WBC: 44.7 10*3/uL — ABNORMAL HIGH (ref 4.0–10.5)
nRBC: 1.5 % — ABNORMAL HIGH (ref 0.0–0.2)

## 2020-01-31 LAB — BASIC METABOLIC PANEL
Anion gap: 8 (ref 5–15)
BUN: 42 mg/dL — ABNORMAL HIGH (ref 8–23)
CO2: 15 mmol/L — ABNORMAL LOW (ref 22–32)
Calcium: 7.3 mg/dL — ABNORMAL LOW (ref 8.9–10.3)
Chloride: 119 mmol/L — ABNORMAL HIGH (ref 98–111)
Creatinine, Ser: 1.23 mg/dL — ABNORMAL HIGH (ref 0.44–1.00)
GFR calc Af Amer: 53 mL/min — ABNORMAL LOW (ref >60)
GFR calc non Af Amer: 46 mL/min — ABNORMAL LOW (ref >60)
Glucose, Bld: 93 mg/dL (ref 70–99)
Potassium: 4.5 mmol/L (ref 3.5–5.1)
Sodium: 142 mmol/L (ref 135–145)

## 2020-01-31 LAB — MAGNESIUM: Magnesium: 1.9 mg/dL (ref 1.7–2.4)

## 2020-01-31 MED ORDER — GUAIFENESIN-DM 100-10 MG/5ML PO SYRP
5.0000 mL | ORAL_SOLUTION | Freq: Four times a day (QID) | ORAL | Status: DC | PRN
  Administered 2020-01-31: 22:00:00 5 mL
  Filled 2020-01-31 (×2): qty 5

## 2020-01-31 MED ORDER — FREE WATER
250.0000 mL | Status: DC
  Administered 2020-01-31 – 2020-02-06 (×35): 250 mL

## 2020-01-31 MED ORDER — VITAL 1.5 CAL PO LIQD
1000.0000 mL | ORAL | Status: DC
  Administered 2020-01-31 – 2020-02-01 (×2): 1000 mL
  Filled 2020-01-31 (×2): qty 1000

## 2020-01-31 MED ORDER — TRAZODONE HCL 50 MG PO TABS
50.0000 mg | ORAL_TABLET | Freq: Every day | ORAL | Status: DC
  Administered 2020-01-31: 22:00:00 50 mg via NASOGASTRIC
  Filled 2020-01-31: qty 1

## 2020-01-31 MED ORDER — RESOURCE THICKENUP CLEAR PO POWD
ORAL | Status: DC | PRN
  Filled 2020-01-31: qty 125

## 2020-01-31 MED ORDER — GUAIFENESIN-CODEINE 100-10 MG/5ML PO SOLN
5.0000 mL | Freq: Four times a day (QID) | ORAL | Status: AC | PRN
  Administered 2020-01-31: 5 mL
  Filled 2020-01-31: qty 5

## 2020-01-31 MED ORDER — GLUCOSE 40 % PO GEL
ORAL | Status: AC
  Administered 2020-01-31: 20:00:00 37.5 g
  Filled 2020-01-31: qty 1

## 2020-01-31 MED ORDER — OXYCODONE HCL ER 10 MG PO T12A
10.0000 mg | EXTENDED_RELEASE_TABLET | Freq: Two times a day (BID) | ORAL | Status: DC
  Administered 2020-01-31 – 2020-02-02 (×5): 10 mg via ORAL
  Filled 2020-01-31 (×5): qty 1

## 2020-01-31 NOTE — Progress Notes (Signed)
Chaplain engaged in initial visit with Natasha Jacobson.  Natasha Jacobson stated that she was not doing well today by shaking her head "no" when asked how she was feeling. Chaplain offered prayer over [name] and will continue to follow-up.

## 2020-01-31 NOTE — Progress Notes (Signed)
Transferred pt from bed to chair this morniing. Pt sat in the chair for 3 hours. Ask RN to get her back in the bed this afternoon. Ate about 20% of her lunch tray. Back in the bed, call bell within reach. Continue to c/p of pain all over. Will continue to monitor pt.

## 2020-01-31 NOTE — Progress Notes (Signed)
Nutrition Follow-up  DOCUMENTATION CODES:   Severe malnutrition in context of chronic illness  INTERVENTION:   Tube feeding: - Vital 1.5 @ 30 ml/hr via Cortrak (720 ml/day) - Pro-stat 30 ml daily - Free water 250 ml q 4 hours  Tube feeding regimen provides 1180 kcal, 64 grams of protein, and 2050 ml of H2O (74% of kcal needs, 80% of protein needs).  - Vital Cuisine Shake TID with meals, each supplement provides 520 kcal and 22 grams of protein  - Encourage adequate PO intake  - Provide feeding assistance as needed  NUTRITION DIAGNOSIS:   Severe Malnutrition related to chronic illness (chronic pancreatitis) as evidenced by severe fat depletion, severe muscle depletion.  Ongoing, being addressed via TF and supplements  GOAL:   Patient will meet greater than or equal to 90% of their needs  Progressing  MONITOR:   Diet advancement, Labs, Weight trends, TF tolerance, I & O's  REASON FOR ASSESSMENT:   Consult Enteral/tube feeding initiation and management  ASSESSMENT:   69 y.o. female with history of chronic alcoholic pancreatitis s/p exchange of pancreatico-gastrostomy stent at [facility] on 01/12/20. She started developing abdominal pain later in the evening which continued to worsen and 4/9 she began having hematemesis. She also had dark stools following which patient decided to come to the ED. Pain has been continuous epigastric in nature radiating to the back. CT abdomen/pelvis in the ED showed concern for acute on chronic pancreatitis and UA concerning for UTI. No bed available at [facility] to transfer patient so ED MD talked with GI and patient was admitted at [facility].  04/12 - reintubated for encephalopathy and hypoxia 04/13 - s/p EGD 04/16 - extubated, Cortrak placed (post-pyloric), off pressors, renal function improved 04/19 - reintubated 04/22 - extubated 04/26 - s/p MBSS with recommendations for dysphagia 1 diet with nectar-thick liquids  Cortrak remains in  place with TF infusing.  Spoke with pt at bedside. Pt shaking head "no" when asked how she is feeling. Pt indicates that her stomach hurts today. Noted ~25% completed breakfast meal tray. Pt receiving lunch meal tray at time of visit and indicates that she will try to eat some of it. Discussed importance of adequate PO intake to be able to remove Cortrak and d/c tube feeds. Pt expresses understanding.  Discussed plan to decrease TF to promote appetite and PO intake with RN and MD. MD approved decreasing free water flushes.  Current TF: Vital 1.5 @ 45 ml/hr, Pro-stat 30 ml daily, free water 400 ml q 4 hours  Meal Completion: 15% x 1 meal yesterday  Medications reviewed and include: geriatric MVI, SSI q 4 hours, protonix, thiamine  Labs reviewed: chloride 119, hemoglobin 8.8 CBG's: 108-147 x 24 hours  Stool: 1450 ml x 24 hours  Diet Order:   Diet Order            DIET - DYS 1 Room service appropriate? Yes; Fluid consistency: Nectar Thick  Diet effective now              EDUCATION NEEDS:   Not appropriate for education at this time  Skin:  Skin Assessment: Skin Integrity Issues: Other: MASD buttocks/groin  Last BM:  01/30/20 type 7 via rectal tube  Height:   Ht Readings from Last 1 Encounters:  01/14/20 5\' 4"  (1.626 m)    Weight:   Wt Readings from Last 1 Encounters:  01/31/20 58.4 kg    Ideal Body Weight:  54.5 kg  BMI:  Body mass index is 22.1 kg/m.  Estimated Nutritional Needs:   Kcal:  1600-1800  Protein:  80-95 grams  Fluid:  >/= 1.5 L    Gaynell Face, MS, RD, LDN Inpatient Clinical Dietitian Pager: [phone] Weekend/After Hours: [phone]

## 2020-01-31 NOTE — Plan of Care (Signed)
  Problem: Education: Goal: Knowledge of General Education information will improve Description: Including pain rating scale, medication(s)/side effects and non-pharmacologic comfort measures Outcome: Progressing   Problem: Clinical Measurements: Goal: Will remain free from infection Outcome: Progressing   Problem: Clinical Measurements: Goal: Respiratory complications will improve Outcome: Progressing   Problem: Clinical Measurements: Goal: Cardiovascular complication will be avoided Outcome: Progressing   Problem: Activity: Goal: Risk for activity intolerance will decrease Outcome: Progressing   

## 2020-01-31 NOTE — Progress Notes (Signed)
PROGRESS NOTE    Natasha Jacobson  ZES:[id number] DOB: 27-May-1951 DOA: 01/13/2020 PCP: Ardyth Gal, MD    Brief Narrative:  Patient was admitted to the hospital with a working diagnosis of acute on chronic pancreatitis sp a recent pancreatico gastrostomy stent [facility]). She was diagnosed acute gastrointestinal bleed, due tosplenic artery pseudoaneurysm, now sp embolization. She required invasive mechanica ventilation and vasopressors.  69 year old female who presented with abdominal pain and hematemesis. She does have a significant past medical history for chronic alcoholic pancreatitis, she is status post exchange of pancreatico gastrostomy stent at [facility] on January 12, 2020. Patient had recurrent abdominal pain and hematemesis that prompted her to come back to the hospital. On her initial physical examination her blood pressure was 107/58, heart rate 85, respiratory rate 18, oxygen saturation 99%, her lungs were clear to auscultation bilaterally, heart S1-S2 present and rhythmic, her abdomen was tender to palpation in the epigastric region, no lower extremity edema. CT of the abdomen showed mild peripancreatic haziness suggesting acute on chronic pancreatitis. Positive pancreatic stent with pigtail ending in the second portion of the duodenum.  Patient developed hypotension and hemorrhagic shock, requiringmultiple PRBC transfusion and invasive mechanical ventilation. Further work-up with upper endoscopy showed active upper GI bleeding, follow-up CT of the abdomen showed 7 mm pseudoaneurysm over pancreatic branch of the splenic artery,likely culprit of hemorrhage. Interventional radiology performed a splenic arteriogram and coil embolization of pseudoaneurysm.  Patient failed extubation April 11, had to be reintubated April 12 due to increased work of breathing and respiratory distress.  Patient was stabilized and liberated from mechanical ventilation April 16, butpatient developed  worsening hypoxemic and hypercapnic respiratory failure and had to be reintubated April 19.Diagnosed with left-sided pneumonia and placed onantibiotic therapy.  Patient was liberated from mechanical ventilation April 22.  Transferred to [facility] 04/23.  Patient with persistent leukocytosis, very weak and deconditioned. Follow abdominal CT with no contrast with no acute changes. Patient with persistent abdominal pain.  Per swallow evaluation patient is allowed to have dysphagia one diet. Continue to be very weak and deconditioned.    Assessment & Plan:   Principal Problem:   Pancreatitis, acute Active Problems:   COPD (chronic obstructive pulmonary disease) (HCC)   Hypothyroidism   Essential hypertension   Acute pancreatitis   Gastrointestinal hemorrhage   Acute respiratory failure (HCC)   AKI (acute kidney injury) (HCC)   Anemia   Hemorrhagic shock (HCC)   Acute encephalopathy   HAP (hospital-acquired pneumonia)   Septic shock (HCC)   Goals of care, counseling/discussion   Palliative care by specialist   DNR (do not resuscitate)   Advanced care planning/counseling discussion   Protein-calorie malnutrition, severe   Acute respiratory distress   Palliative care encounter   1. Acuteon chronic alcoholicpancreatitis, complicated with hemorrhagic shock (acute blood loss anemia), due to upper GI bleed, splenic artery pseudoaneurysm(inside the gastric wall at the site of the stent crossing into the stomach). (present on admission)sp pancreatic gastroctomy stent at [facility] on January 12, 2020.   SP pseudoaneuryms embolization 04/10.   Patient is now allowed to eat, per speech therapy on dysphagia one diet, she continue to complain of abdominal pain, has not been able to sleep, not significant response to lorazepam. Continue to have leukocytosis (leukemoid reaction), wbc at 44,7. Stable Hgb and Hct 8.8 and 28,6 wit no signs of bleeding.   Will reduce tube feedings rate to 30 ml per  H and will encourage po intake, out of bed to chair  as tolerated tid with meals. Continue pain control with scheduled acetaminophen, prn oxycodone and hydromorphone, due to persistent pain will add long acting oxycodone q 12 H. Continue close monitoring. Once improve po intake will dc NG tube. No radiographic signs of pancreatic necrosis, will continue to follow up on cell count, hold on antibiotic therapy for now.   Continue with pantoprazole.   2. Acute hypoxic respiratory failure due to left lower lobe aspiration pneumonia. Patient now off mechanical ventilation, her respiratory cultures were positive for MSSA and enterobacter.Completed Linezolid and cefepime antibiotic therapy. Positive bilateral pleural effusions.  Continue oxymetry monitoring and aspiration precautions, as needed bronchodilators and as tolerated airway clearing techniques with flutter valve and incentive spirometer.   3. AKI with hypernatremia/ hypokalemia/ hyperchloremia/ hyperchloremic non gap metabolic acidosis. positive diarrhea D diff negative. Improved Na down to 142, K at 4,5 and stable renal function with serum cr at 1,23. Cl 119 and Mg at 1,9.   Will reduce rate of tube feeds and continue to encourage po intake, decrease free water flushes. Continue with cholestyramine.   4. Acute metabolic encephalopathy. Mentation continue to improve, but continue to complain of abdominal pain and not able to sleep, no agitation, but continue to need bilateral mittens.  Will add trazodone for sleep, continue neuro checks per unit protocol.   5. Severe calorie protein malnutrition.Continue with Nutritional supplementation, thiamine and multivitamins.  6. Hypothyroid.Continue levothyroxine  7. T2DM (Hgb A1c 5,4) .Fasting glucose thisam 119. continue with glucose cover and monitoring with insulin sliding scale.Decrease rate of tube feedings.   DVT prophylaxis:scd Code Status:dnr Family  Communication:No family at the bedside 3 Disposition Plan: Patient is from:Home Anticipated DC to:SNF Anticipated DC date:To be determined Anticipated DC barriers:Patient continue critically very weak and deconditioned, needs high nursing care, still getting tube feedings to maintain nutrition and hydration.      Nutrition Status: Nutrition Problem: Severe Malnutrition Etiology: chronic illness(chronic pancreatitis) Signs/Symptoms: severe fat depletion, severe muscle depletion Interventions: Tube feeding, Prostat    Subjective: Patient continue to have abdominal pain, no nausea or vomiting and tolerating tube feedings, poor oral intake and having difficulty sleeping at night.   Objective: Vitals:   01/30/20 1922 01/30/20 2313 01/31/20 0419 01/31/20 0758  BP: 122/76 134/77 133/70 140/73  Pulse: 76 88 84 79  Resp: (!) 21 (!) 21 (!) 24 (!) 25  Temp: (!) 97.1 F (36.2 C) 98 F (36.7 C) 98 F (36.7 C) (!) 97.3 F (36.3 C)  TempSrc: Axillary Axillary Oral Oral  SpO2: 96% 98% 100% 91%  Weight:   58.4 kg   Height:        Intake/Output Summary (Last 24 hours) at 01/31/2020 0802 Last data filed at 01/31/2020 0400 Gross per 24 hour  Intake 1557 ml  Output 1450 ml  Net 107 ml   Filed Weights   01/29/20 0300 01/30/20 0430 01/31/20 0419  Weight: 60 kg 62.1 kg 58.4 kg    Examination:   General: Not in pain or dyspnea, deconditioned  Neurology: Awake and alert, non focal  E ENT: positive pallor, no icterus, oral mucosa moist, NG tube in place.  Cardiovascular: No JVD. S1-S2 present, rhythmic, no gallops, rubs, or murmurs. No lower extremity edema. Pulmonary:  Positive breath sounds bilaterally, no wheezing, scattered rhonchi or rales. Gastrointestinal. Abdomen with no organomegaly, non tender, no rebound or guarding Skin. No  rashes Musculoskeletal: no joint deformities     Data Reviewed: I have personally reviewed following labs and imaging studies  CBC: Recent Labs  Lab 01/27/20 1200 01/28/20 0420 01/29/20 0500 01/30/20 0306 01/31/20 0513  WBC 41.8* 43.1* 44.1* 44.6* 44.7*  NEUTROABS 41.8* 38.1* 36.5* 38.6* PENDING  HGB 8.9* 9.6* 9.9* 9.4* 8.8*  HCT 27.7* 29.4* 30.3* 29.2* 28.6*  MCV 95.2 95.5 93.8 94.2 98.3  PLT 275 305 309 261 263   Basic Metabolic Panel: Recent Labs  Lab 01/25/20 0445 01/25/20 0816 01/26/20 0430 01/26/20 0430 01/26/20 0740 01/27/20 0549 01/27/20 1200 01/28/20 0420 01/29/20 0500 01/30/20 0306  NA 149*   < > 142   < > 143  --  145 148* 149* 149*  K 4.3   < > 3.4*   < > 3.5  --  2.4* 2.7* 2.7* 3.4*  CL 121*   < > 113*  --   --   --  117* 118* 120* 122*  CO2 18*   < > 20*  --   --   --  19* 21* 20* 20*  GLUCOSE 273*   < > 241*  --   --   --  143* 168* 128* 156*  BUN 118*   < > 105*  --   --   --  82* 67* 51* 46*  CREATININE 3.24*   < > 2.48*  --   --   --  1.98* 1.87* 1.47* 1.28*  CALCIUM 7.5*   < > 7.4*  --   --   --  7.2* 7.4* 7.4* 7.2*  MG 2.4  --  2.3  --   --  2.0  --   --  1.9 1.7   < > = values in this interval not displayed.   GFR: Estimated Creatinine Clearance: 37.3 mL/min (A) (by C-G formula based on SCr of 1.28 mg/dL (H)). Liver Function Tests: Recent Labs  Lab 01/24/20 0952 01/25/20 0445 01/26/20 0430 01/30/20 1350  AST 59* 118* 96*  --   ALT 55* 76* 85*  --   ALKPHOS 87 92 86  --   BILITOT 1.1 0.7 0.8  --   PROT 4.6* 4.6* 4.3*  --   ALBUMIN 1.3* 1.3* 1.2* 1.2*   No results for input(s): LIPASE, AMYLASE in the last 168 hours. Recent Labs  Lab 01/25/20 0445  AMMONIA 37*   Coagulation Profile: No results for input(s): INR, PROTIME in the last 168 hours. Cardiac Enzymes: No results for input(s): CKTOTAL, CKMB, CKMBINDEX, TROPONINI in the last 168 hours. BNP (last 3 results) No results for input(s): PROBNP in the last 8760  hours. HbA1C: No results for input(s): HGBA1C in the last 72 hours. CBG: Recent Labs  Lab 01/30/20 1124 01/30/20 1656 01/30/20 1924 01/30/20 2316 01/31/20 0417  GLUCAP 147* 119* 136* 120* 108*   Lipid Profile: No results for input(s): CHOL, HDL, LDLCALC, TRIG, CHOLHDL, LDLDIRECT in the last 72 hours. Thyroid Function Tests: No results for input(s): TSH, T4TOTAL, FREET4, T3FREE, THYROIDAB in the last 72 hours. Anemia Panel: No results for input(s): VITAMINB12, FOLATE, FERRITIN, TIBC, IRON, RETICCTPCT in the last 72 hours.    Radiology Studies: I have reviewed all of the imaging during this hospital visit personally     Scheduled Meds: . acetaminophen (TYLENOL) oral liquid 160 mg/5 mL  650 mg Per Tube Q6H  . Chlorhexidine Gluconate Cloth  6 each Topical Daily  . feeding supplement (PRO-STAT SUGAR FREE 64)  30 mL Per Tube Daily  . free water  400 mL Per Tube Q4H  . geriatric multivitamins-minerals  15 mL Per Tube Daily  .  insulin aspart  0-6 Units Subcutaneous Q4H  . levothyroxine  50 mcg Per Tube Q0600  . pantoprazole sodium  40 mg Per Tube BID  . sodium chloride flush  10-40 mL Intracatheter Q12H  . thiamine  100 mg Per Tube Daily   Continuous Infusions: . feeding supplement (VITAL 1.5 CAL) 1,000 mL (01/31/20 0405)     LOS: 18 days        Irmalee Riemenschneider Gerome Apley, MD

## 2020-02-01 DIAGNOSIS — K860 Alcohol-induced chronic pancreatitis: Secondary | ICD-10-CM | POA: Diagnosis not present

## 2020-02-01 DIAGNOSIS — Z515 Encounter for palliative care: Secondary | ICD-10-CM | POA: Diagnosis not present

## 2020-02-01 DIAGNOSIS — G9340 Encephalopathy, unspecified: Secondary | ICD-10-CM | POA: Diagnosis not present

## 2020-02-01 DIAGNOSIS — R0603 Acute respiratory distress: Secondary | ICD-10-CM | POA: Diagnosis not present

## 2020-02-01 DIAGNOSIS — K859 Acute pancreatitis without necrosis or infection, unspecified: Secondary | ICD-10-CM | POA: Diagnosis not present

## 2020-02-01 DIAGNOSIS — Z7189 Other specified counseling: Secondary | ICD-10-CM | POA: Diagnosis not present

## 2020-02-01 DIAGNOSIS — N179 Acute kidney failure, unspecified: Secondary | ICD-10-CM | POA: Diagnosis not present

## 2020-02-01 DIAGNOSIS — Z66 Do not resuscitate: Secondary | ICD-10-CM | POA: Diagnosis not present

## 2020-02-01 LAB — CBC WITH DIFFERENTIAL/PLATELET
Abs Immature Granulocytes: 2.13 10*3/uL — ABNORMAL HIGH (ref 0.00–0.07)
Basophils Absolute: 0 10*3/uL (ref 0.0–0.1)
Basophils Relative: 0 %
Eosinophils Absolute: 0.2 10*3/uL (ref 0.0–0.5)
Eosinophils Relative: 1 %
HCT: 27.4 % — ABNORMAL LOW (ref 36.0–46.0)
Hemoglobin: 8.8 g/dL — ABNORMAL LOW (ref 12.0–15.0)
Immature Granulocytes: 6 %
Lymphocytes Relative: 4 %
Lymphs Abs: 1.4 10*3/uL (ref 0.7–4.0)
MCH: 31.2 pg (ref 26.0–34.0)
MCHC: 32.1 g/dL (ref 30.0–36.0)
MCV: 97.2 fL (ref 80.0–100.0)
Monocytes Absolute: 1.6 10*3/uL — ABNORMAL HIGH (ref 0.1–1.0)
Monocytes Relative: 5 %
Neutro Abs: 30.4 10*3/uL — ABNORMAL HIGH (ref 1.7–7.7)
Neutrophils Relative %: 84 %
Platelets: 377 10*3/uL (ref 150–400)
RBC: 2.82 MIL/uL — ABNORMAL LOW (ref 3.87–5.11)
RDW: 18.6 % — ABNORMAL HIGH (ref 11.5–15.5)
WBC: 35.8 10*3/uL — ABNORMAL HIGH (ref 4.0–10.5)
nRBC: 1.3 % — ABNORMAL HIGH (ref 0.0–0.2)

## 2020-02-01 LAB — GLUCOSE, CAPILLARY
Glucose-Capillary: 115 mg/dL — ABNORMAL HIGH (ref 70–99)
Glucose-Capillary: 123 mg/dL — ABNORMAL HIGH (ref 70–99)
Glucose-Capillary: 123 mg/dL — ABNORMAL HIGH (ref 70–99)
Glucose-Capillary: 127 mg/dL — ABNORMAL HIGH (ref 70–99)
Glucose-Capillary: 134 mg/dL — ABNORMAL HIGH (ref 70–99)
Glucose-Capillary: 141 mg/dL — ABNORMAL HIGH (ref 70–99)
Glucose-Capillary: 156 mg/dL — ABNORMAL HIGH (ref 70–99)

## 2020-02-01 LAB — BASIC METABOLIC PANEL
Anion gap: 8 (ref 5–15)
BUN: 37 mg/dL — ABNORMAL HIGH (ref 8–23)
CO2: 19 mmol/L — ABNORMAL LOW (ref 22–32)
Calcium: 7.8 mg/dL — ABNORMAL LOW (ref 8.9–10.3)
Chloride: 114 mmol/L — ABNORMAL HIGH (ref 98–111)
Creatinine, Ser: 1.17 mg/dL — ABNORMAL HIGH (ref 0.44–1.00)
GFR calc Af Amer: 56 mL/min — ABNORMAL LOW (ref >60)
GFR calc non Af Amer: 49 mL/min — ABNORMAL LOW (ref >60)
Glucose, Bld: 142 mg/dL — ABNORMAL HIGH (ref 70–99)
Potassium: 3.4 mmol/L — ABNORMAL LOW (ref 3.5–5.1)
Sodium: 141 mmol/L (ref 135–145)

## 2020-02-01 MED ORDER — OXYCODONE HCL 5 MG/5ML PO SOLN
10.0000 mg | ORAL | Status: DC | PRN
  Administered 2020-02-01: 10 mg
  Filled 2020-02-01: qty 10

## 2020-02-01 MED ORDER — POTASSIUM CHLORIDE 20 MEQ/15ML (10%) PO SOLN
40.0000 meq | Freq: Once | ORAL | Status: DC
  Filled 2020-02-01 (×3): qty 30

## 2020-02-01 MED ORDER — PREGABALIN 75 MG PO CAPS
75.0000 mg | ORAL_CAPSULE | Freq: Two times a day (BID) | ORAL | Status: DC
  Administered 2020-02-01 – 2020-02-09 (×15): 75 mg via ORAL
  Filled 2020-02-01 (×16): qty 1

## 2020-02-01 MED ORDER — TRAZODONE HCL 50 MG PO TABS
50.0000 mg | ORAL_TABLET | Freq: Every day | ORAL | Status: DC
  Administered 2020-02-01 – 2020-02-08 (×8): 50 mg via ORAL
  Filled 2020-02-01 (×8): qty 1

## 2020-02-01 MED ORDER — PANCRELIPASE (LIP-PROT-AMYL) 12000-38000 UNITS PO CPEP
12000.0000 [IU] | ORAL_CAPSULE | Freq: Three times a day (TID) | ORAL | Status: DC
  Administered 2020-02-02 – 2020-02-09 (×17): 12000 [IU] via ORAL
  Filled 2020-02-01 (×19): qty 1

## 2020-02-01 MED ORDER — OXYCODONE HCL 5 MG/5ML PO SOLN
10.0000 mg | ORAL | Status: DC | PRN
  Administered 2020-02-02: 10 mg via ORAL
  Filled 2020-02-01: qty 10

## 2020-02-01 MED ORDER — MELATONIN 3 MG PO TABS
3.0000 mg | ORAL_TABLET | Freq: Every day | ORAL | Status: DC
  Administered 2020-02-02 – 2020-02-08 (×7): 3 mg via ORAL
  Filled 2020-02-01 (×7): qty 1

## 2020-02-01 MED ORDER — PANTOPRAZOLE SODIUM 40 MG PO TBEC
40.0000 mg | DELAYED_RELEASE_TABLET | Freq: Two times a day (BID) | ORAL | Status: DC
  Administered 2020-02-02 – 2020-02-06 (×10): 40 mg via ORAL
  Filled 2020-02-01 (×11): qty 1

## 2020-02-01 NOTE — Progress Notes (Signed)
Inpatient Diabetes Program Recommendations  AACE/ADA: New Consensus Statement on Inpatient Glycemic Control (2015)  Target Ranges:  Prepandial:   less than 140 mg/dL      Peak postprandial:   less than 180 mg/dL (1-2 hours)      Critically ill patients:  140 - 180 mg/dL   Lab Results  Component Value Date   GLUCAP 134 (H) 02/01/2020   HGBA1C 5.4 01/14/2020    Review of Glycemic Control Results for Natasha Jacobson, Natasha Jacobson (MRN [id number]) as of 02/01/2020 09:45  Ref. Range 01/31/2020 19:37 01/31/2020 20:02 01/31/2020 23:44 02/01/2020 03:38 02/01/2020 08:25  Glucose-Capillary Latest Ref Range: 70 - 99 mg/dL 62 (L) 72 599 (H) 234 (H) 134 (H)   Diabetes history: None noted Outpatient Diabetes medications: None Current orders for Inpatient glycemic control:  Novolog 0-6 units q 4 hours  Inpatient Diabetes Program Recommendations:    Note low blood sugars on 4/27.  May consider d/c of Novolog correction.   Thanks  Beryl Meager, RN, BC-ADM Inpatient Diabetes Coordinator Pager [phone] (8a-5p)

## 2020-02-01 NOTE — Plan of Care (Signed)
  Problem: Education: Goal: Knowledge of General Education information will improve Description: Including pain rating scale, medication(s)/side effects and non-pharmacologic comfort measures Outcome: Progressing   Problem: Health Behavior/Discharge Planning: Goal: Ability to manage health-related needs will improve Outcome: Progressing   Problem: Clinical Measurements: Goal: Ability to maintain clinical measurements within normal limits will improve Outcome: Progressing Goal: Will remain free from infection Outcome: Progressing Goal: Diagnostic test results will improve Outcome: Progressing Goal: Respiratory complications will improve Outcome: Progressing Goal: Cardiovascular complication will be avoided Outcome: Progressing   Problem: Activity: Goal: Risk for activity intolerance will decrease Outcome: Progressing   Problem: Nutrition: Goal: Adequate nutrition will be maintained Outcome: Progressing   Problem: Coping: Goal: Level of anxiety will decrease Outcome: Progressing   Problem: Elimination: Goal: Will not experience complications related to bowel motility Outcome: Progressing Goal: Will not experience complications related to urinary retention Outcome: Progressing   Problem: Pain Managment: Goal: General experience of comfort will improve Outcome: Progressing   Problem: Safety: Goal: Ability to remain free from injury will improve Outcome: Progressing   Problem: Skin Integrity: Goal: Risk for impaired skin integrity will decrease Outcome: Progressing   Problem: Activity: Goal: Ability to tolerate increased activity will improve Outcome: Progressing   Problem: Respiratory: Goal: Ability to maintain a clear airway and adequate ventilation will improve Outcome: Progressing   Problem: Role Relationship: Goal: Method of communication will improve Outcome: Progressing   Problem: Education: Goal: Ability to identify signs and symptoms of  gastrointestinal bleeding will improve Outcome: Progressing   Problem: Bowel/Gastric: Goal: Will show no signs and symptoms of gastrointestinal bleeding Outcome: Progressing   Problem: Fluid Volume: Goal: Will show no signs and symptoms of excessive bleeding Outcome: Progressing   Problem: Clinical Measurements: Goal: Complications related to the disease process, condition or treatment will be avoided or minimized Outcome: Progressing   

## 2020-02-01 NOTE — Progress Notes (Signed)
Daily Progress Note   Patient Name: Natasha Jacobson       Date: 02/01/2020 DOB: [birth year]  Age: 69 y.o. MRN#: [id number] Attending Physician: Leatha Gilding, MD Primary Care Physician: Ardyth Gal, MD Admit Date: 01/13/2020  Reason for Consultation/Follow-up: Establishing goals of care and Pain control  Subjective: Natasha Jacobson is much more alert and oriented today. She tells me her daughter has discussed with her her hospitalization and she was not aware of how sick she was. She worries that she will not be able to live independently.  Pain management discussed- Natasha Jacobson has previously seen pain management clinic and had her treatment revoked by them due to violating pain management contracts by taking medications not prescribed (TCA and Valium) as well as purchasing extra oxycodone to supplement what was prescribed.  I asked Natasha Jacobson what her goal of pain management would be and she states: "to be able to function". She wants to be able to care for herself and to enjoy being outdoors with the wildlife that surrounds her home.  Her pain is specifically in her R upper quadrant of abdomen. Very tender to touch. Comes on intermittently in spasms- sometimes radiating. She describes it as excruciating pain, leaving her unable to talk or walk when episodes occur.  Prior to admission she was on gabapentin and feels this was helpful.  She has tried Cymbalta, however, it cause her sodium to drop and she did not tolerate it.   Review of Systems  Constitutional: Positive for malaise/fatigue and weight loss.  Gastrointestinal: Positive for abdominal pain.  Psychiatric/Behavioral: The patient is nervous/anxious.     Length of Stay: 19  Current Medications: Scheduled Meds:  . acetaminophen (TYLENOL) oral liquid 160  mg/5 mL  650 mg Per Tube Q6H  . Chlorhexidine Gluconate Cloth  6 each Topical Daily  . feeding supplement (PRO-STAT SUGAR FREE 64)  30 mL Per Tube Daily  . free water  250 mL Per Tube Q4H  . geriatric multivitamins-minerals  15 mL Per Tube Daily  . insulin aspart  0-6 Units Subcutaneous Q4H  . levothyroxine  50 mcg Per Tube Q0600  . [START ON 02/02/2020] lipase/protease/amylase  12,000 Units Oral TID AC  . oxyCODONE  10 mg Oral Q12H  . pantoprazole sodium  40 mg Per Tube BID  . potassium chloride  40  mEq Oral Once  . pregabalin  75 mg Oral BID  . sodium chloride flush  10-40 mL Intracatheter Q12H  . thiamine  100 mg Per Tube Daily  . traZODone  50 mg Per NG tube QHS    Continuous Infusions: . feeding supplement (VITAL 1.5 CAL) 1,000 mL (02/01/20 0520)    PRN Meds: albuterol, Gerhardt's butt cream, guaiFENesin-dextromethorphan, HYDROmorphone (DILAUDID) injection, ipratropium-albuterol, ondansetron (ZOFRAN) IV, oxyCODONE, Resource ThickenUp Clear, sodium chloride flush  Physical Exam Vitals and nursing note reviewed.  Constitutional:      Comments: Ill appearing, cachectic  Cardiovascular:     Rate and Rhythm: Normal rate and regular rhythm.  Pulmonary:     Effort: Pulmonary effort is normal.  Abdominal:     General: There is no distension.     Palpations: There is no mass.     Tenderness: There is abdominal tenderness.  Skin:    General: Skin is warm and dry.     Comments: Slight jaundiced appearance  Neurological:     Mental Status: She is alert and oriented to person, place, and time.  Psychiatric:        Mood and Affect: Mood normal.        Behavior: Behavior normal.        Thought Content: Thought content normal.        Judgment: Judgment normal.             Vital Signs: BP 127/72 (BP Location: Left Arm)   Pulse 78   Temp 98.1 F (36.7 C) (Oral)   Resp 19   Ht 5\' 4"  (1.626 m)   Wt 60 kg   SpO2 98%   BMI 22.71 kg/m  SpO2: SpO2: 98 % O2 Device: O2 Device:  Nasal Cannula O2 Flow Rate: O2 Flow Rate (L/min): 4 L/min  Intake/output summary:   Intake/Output Summary (Last 24 hours) at 02/01/2020 1725 Last data filed at 02/01/2020 02/03/2020 Gross per 24 hour  Intake 4822.5 ml  Output 1350 ml  Net 3472.5 ml   LBM: Last BM Date: 01/31/20 Baseline Weight: Weight: 50.7 kg Most recent weight: Weight: 60 kg       Palliative Assessment/Data: PPS: 40%   Flowsheet Rows     Most Recent Value  Intake Tab  Referral Department  Critical care  Unit at Time of Referral  ICU  Date Notified  01/24/20  Palliative Care Type  New Palliative care  Reason for referral  Clarify Goals of Care  Date of Admission  01/13/20  Date first seen by Palliative Care  01/25/20  # of days Palliative referral response time  1 Day(s)  # of days IP prior to Palliative referral  11  Clinical Assessment  Psychosocial & Spiritual Assessment  Palliative Care Outcomes      Patient Active Problem List   Diagnosis Date Noted  . Acute respiratory distress   . Palliative care encounter   . Protein-calorie malnutrition, severe 01/27/2020  . HAP (hospital-acquired pneumonia)   . Septic shock (HCC)   . Goals of care, counseling/discussion   . Palliative care by specialist   . DNR (do not resuscitate)   . Advanced care planning/counseling discussion   . Acute encephalopathy   . Hemorrhagic shock (HCC)   . AKI (acute kidney injury) (HCC)   . Anemia   . Essential hypertension 01/14/2020  . Acute pancreatitis 01/14/2020  . Gastrointestinal hemorrhage   . Acute respiratory failure (HCC)   . Pancreatitis, acute 01/13/2020  . Generalized  anxiety disorder 12/20/2019  . 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 Chronic pain syndrome 08/02/2018  . Chronic hepatitis C without hepatic coma ([place]) 08/18/2017  . Hypothyroidism 07/08/2016  . Hearing loss, right 07/08/2016  . COPD (chronic obstructive pulmonary disease) ([place])  09/06/2015  . SSS (sick sinus syndrome) ([place]) 09/06/2015    Palliative Care Assessment & Plan   Patient Profile: 69 y.o. female  with past medical history of COPD, chronic Hep C, chronic pancreatitis, anxiety, chronic pain, pancreatic gastrostomy stent exchanged on 4/8  admitted on 01/13/2020 with hematemesis, red bloody bowel movements, abdominal pain, hypotension and low Hgb. Clinically worsened on 4/10 requiring intubation. 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 Urine + e. Faecalis. Evidence of possible renal infarct on CT scan. She was extubated 4/11, reintubated 4/12, extubated 4/16, and reintubated 4/19. Now with pneumonia and septic shock requiring vasopressors. Hypoalbumin at 1.3. Transaminases at creatinine are both trending up. She has remained encephalopathic during entire admission. Palliative medicine consulted for goals of care.   Assessment/Recommendations/Plan  Chronic pancreatitis pain- will increase oxycodone IR solution to 10mg  q4 hours prn; start Creon TID with meals (there is some evidence of pancreatic enzyme supplementation with chronic pancreatitis improving pain management); start pregabalin 75mg  BID Will transition per tube meds to oral meds as SLP recommendations have changed Encouraged patient to utilize prn oxycodone before IV dilaudid- discussed this with nursing GOC- continue full scope care- patient is amenable to SNF for rehab with goal of returning home- ?would she benefit from CIR?   Goals of Care and Additional Recommendations: Limitations on Scope of Treatment: Minimize Medications  Code Status: DNR  Prognosis:  Unable to determine  Discharge Planning: To Be Determined  Care plan was discussed with patient and nurse.  Thank you for allowing the Palliative Medicine Team to assist in the care of  this patient.   Time In: 1615 Time Out: 1710 Total time 55 mins Prolonged Time Billed Yes      Greater than 50%  of this time was spent counseling and coordinating care related to the above assessment and plan.  Mariana Kaufman, AGNP-C Palliative Medicine   Please contact Palliative Medicine Team phone at [phone] for questions and concerns.

## 2020-02-01 NOTE — Progress Notes (Signed)
PROGRESS NOTE  Natasha Jacobson XAJ:[id number] DOB: 1951/02/10 DOA: 01/13/2020 PCP: Ardyth Gal, MD   LOS: 19 days   Brief Narrative / Interim history: 69 year old female with history of chronic alcoholic pancreatitis, status post a recent pancreatico gastrostomy stent at [facility] on 01/12/20, admitted here with acute GI bleed due to splenic artery pseudoaneurysm status post embolization.  She presented to [place] with recurrent abdominal pain and hematemesis, and hospital course was complicated by hypotension and hemorrhagic shock requiring multiple PRBC transfusion, invasive mechanical ventilation as well as vasopressors.  GI was consulted and underwent an EGD which showed active upper GI bleeding, and a follow-up CT angiogram showed a 7 mm pseudoaneurysm over the pancreatic branch of the splenic artery.  IR was consulted due to splenic angiogram and coil embolization of the pseudoaneurysm.  She failed extubation on April 11 and had to be reintubated on April 12 due to increased work of breathing and respiratory distress, eventually being extubated.  Failed again extubation on April 16 and had to be reintubated April 19, she was diagnosed with left-sided pneumonia at that time and placed on antibiotics.  She was eventually extubated April/22nd, transferred to [facility] on April/23rd.  Subjective / 24h Interval events: Complains of abdominal pain, and wants her pain medications as soon as possible.  No nausea or vomiting.  Assessment & Plan: Principal Problem Acute on chronic alcoholic pancreatitis, complicated with hemorrhagic shock and acute blood loss anemia due to upper GI bleed, POA-this is due to a splenic artery pseudoaneurysm, now status post embolization by IR on 4/20. -Her abdominal pain is persistent, however expect a chronic component at this point.  She has been cleared for dysphagia 1 diet however she has poor p.o. intake and currently is on tube feeds as well -Has persistent leukocytosis,  afebrile in no apparent active infectious sources, closely monitor.  CT scan of the abdomen on 4/24 without acute findings -Continue pain control with scheduled Tylenol, as needed oxycodone and Dilaudid, palliative care following, appreciate input.  If we can wean off IV pain medication they will be ideal  Active Problems Acute hypoxic respiratory failure due to left lower lobe aspiration pneumonia-now off mechanical ventilation, respiratory cultures showed MSSA and Enterobacter, status post completed treatment with linezolid and cefepime. -Continue oxymetry monitoring and aspiration precautions, as needed bronchodilators and as tolerated airway clearing techniques with flutter valve and incentive spirometer.   AKI with hypernatremia/ hypokalemia/ hyperchloremia/ hyperchloremic non gap metabolic acidosis-patient has had significant diarrhea, C. difficile was negative.  Creatinine improved to 1.17 this morning, sodium, potassium being monitored daily and replete as appropriate  Acute metabolic encephalopathy -likely component of ICU delirium as well, continues to improve  Severe calorie protein malnutrition-Continue with Nutritional supplementation, thiamine and multivitamins.  Hypothyroid-Continuelevothyroxine  T2DM(Hgb A1c 5,4)-continue sliding scale   Scheduled Meds: . acetaminophen (TYLENOL) oral liquid 160 mg/5 mL  650 mg Per Tube Q6H  . Chlorhexidine Gluconate Cloth  6 each Topical Daily  . feeding supplement (PRO-STAT SUGAR FREE 64)  30 mL Per Tube Daily  . free water  250 mL Per Tube Q4H  . geriatric multivitamins-minerals  15 mL Per Tube Daily  . insulin aspart  0-6 Units Subcutaneous Q4H  . levothyroxine  50 mcg Per Tube Q0600  . oxyCODONE  10 mg Oral Q12H  . pantoprazole sodium  40 mg Per Tube BID  . sodium chloride flush  10-40 mL Intracatheter Q12H  . thiamine  100 mg Per Tube Daily  . traZODone  50 mg Per NG tube QHS   Continuous Infusions: . feeding  supplement (VITAL 1.5 CAL) 1,000 mL (02/01/20 0520)   PRN Meds:.albuterol, Gerhardt's butt cream, guaiFENesin-dextromethorphan, HYDROmorphone (DILAUDID) injection, ipratropium-albuterol, ondansetron (ZOFRAN) IV, oxyCODONE, Resource ThickenUp Clear, sodium chloride flush  DVT prophylaxis: SCDs Code Status: DNR Family Communication: no family at bedside  Patient admitted from: home Anticipated d/c place: SNF Barriers to d/c: Remains quite weak and deconditioned, still getting tube feeds and IV pain medications, attempt to advance diet, DC core track and transition to oral pain agents  Consultants:  Palliative care Critical care  Procedures:  4/9 admitted; mass transfusion protocol 4/10 transfer to ICU; EGD, IR embolization 4/11 extubated 4/12 white count remains elevated at 39k, LFTs significantly elevated; continues to have dark red stools; hgb stable; minimal responsiveness; reintubated for increased work of breathing 4/13 LFTs improving. hgb stable. More responsive this morning. Minimal improvement in respiratory status. Increased BRBPR>>EGD 4/14 LFTs, renal function improving. More encephalopathic this morning. hgb stable. 4/15continues to be severely encephalopathic>head CT neg; progressive hypernatremia 148>151 4/16 improved mental status, improved renal function; extubated; off pressors 4/17 remains encephalopathic 4/18 D/c to floor 4/19 Re-intubated and re-admitted to ICU 4/22 extubated 4/23 transferred to [facility]  Microbiology  Respiratory culture 4/20-MSSA, Enterobacter Cloacae  Antimicrobials: None     Objective: Vitals:   01/31/20 1824 01/31/20 1938 02/01/20 0340 02/01/20 0711  BP: (!) 145/67 118/90 (!) 141/61 (!) 142/67  Pulse: 82 86 77 68  Resp: (!) 31 (!) 26 (!) 25 15  Temp: 97.6 F (36.4 C) 98.1 F (36.7 C) (!) 97.4 F (36.3 C) (!) 97.2 F (36.2 C)  TempSrc: Oral Oral Oral Oral  SpO2: 100% 100% 99% 96%  Weight:   60 kg   Height:        Intake/Output  Summary (Last 24 hours) at 02/01/2020 1038 Last data filed at 02/01/2020 0520 Gross per 24 hour  Intake 4842.5 ml  Output 1350 ml  Net 3492.5 ml   Filed Weights   01/30/20 0430 01/31/20 0419 02/01/20 0340  Weight: 62.1 kg 58.4 kg 60 kg    Examination:  Constitutional: NAD Eyes: no scleral icterus ENMT: Mucous membranes are moist.  Neck: normal, supple Respiratory: clear to auscultation bilaterally, no wheezing, no crackles. Normal respiratory effort.  Cardiovascular: Regular rate and rhythm, no murmurs / rubs / gallops.Trace No LE edema. Abdomen: Diffusely tender to palpation, no guarding or rebound, bowel sounds positive Musculoskeletal: no clubbing / cyanosis.  Skin: no rashes Neurologic: CN 2-12 grossly intact. Strength 5/5 in all 4.   Data Reviewed: I have independently reviewed following labs and imaging studies   CBC: Recent Labs  Lab 01/28/20 0420 01/29/20 0500 01/30/20 0306 01/31/20 0513 02/01/20 0500  WBC 43.1* 44.1* 44.6* 44.7* 35.8*  NEUTROABS 38.1* 36.5* 38.6* 37.2* 30.4*  HGB 9.6* 9.9* 9.4* 8.8* 8.8*  HCT 29.4* 30.3* 29.2* 28.6* 27.4*  MCV 95.5 93.8 94.2 98.3 97.2  PLT 305 309 261 263 381   Basic Metabolic Panel: Recent Labs  Lab 01/26/20 0430 01/26/20 0740 01/27/20 0549 01/27/20 1200 01/28/20 0420 01/29/20 0500 01/30/20 0306 01/31/20 0513 02/01/20 0500  NA 142   < >  --    < > 148* 149* 149* 142 141  K 3.4*   < >  --    < > 2.7* 2.7* 3.4* 4.5 3.4*  CL 113*  --   --    < > 118* 120* 122* 119* 114*  CO2 20*  --   --    < >  21* 20* 20* 15* 19*  GLUCOSE 241*  --   --    < > 168* 128* 156* 93 142*  BUN 105*  --   --    < > 67* 51* 46* 42* 37*  CREATININE 2.48*  --   --    < > 1.87* 1.47* 1.28* 1.23* 1.17*  CALCIUM 7.4*  --   --    < > 7.4* 7.4* 7.2* 7.3* 7.8*  MG 2.3  --  2.0  --   --  1.9 1.7 1.9  --    < > = values in this interval not displayed.   Liver Function Tests: Recent Labs  Lab 01/26/20 0430 01/30/20 1350  AST 96*  --   ALT 85*   --   ALKPHOS 86  --   BILITOT 0.8  --   PROT 4.3*  --   ALBUMIN 1.2* 1.2*   Coagulation Profile: No results for input(s): INR, PROTIME in the last 168 hours. HbA1C: No results for input(s): HGBA1C in the last 72 hours. CBG: Recent Labs  Lab 01/31/20 1937 01/31/20 2002 01/31/20 2344 02/01/20 0338 02/01/20 0825  GLUCAP 62* 72 127* 123* 134*    Recent Results (from the past 240 hour(s))  Culture, blood (routine x 2)     Status: None   Collection Time: 01/23/20 11:28 PM   Specimen: BLOOD LEFT HAND  Result Value Ref Range Status   Specimen Description BLOOD LEFT HAND  Final   Special Requests   Final    BOTTLES DRAWN AEROBIC AND ANAEROBIC Blood Culture results may not be optimal due to an inadequate volume of blood received in culture bottles   Culture   Final    NO GROWTH 5 DAYS Performed at [facility], [address] [place], [place] [address]    Report Status 01/29/2020 FINAL  Final  Culture, respiratory (non-expectorated)     Status: None   Collection Time: 01/24/20 12:30 AM   Specimen: Tracheal Aspirate; Respiratory  Result Value Ref Range Status   Specimen Description TRACHEAL ASPIRATE  Final   Special Requests NONE  Final   Gram Stain   Final    MODERATE WBC PRESENT, PREDOMINANTLY PMN ABUNDANT GRAM POSITIVE COCCI IN CHAINS IN CLUSTERS FEW GRAM POSITIVE RODS FEW GRAM NEGATIVE RODS Performed at [facility], [address], [place], [place] [address]    Culture   Final    MODERATE STAPHYLOCOCCUS AUREUS FEW ENTEROBACTER CLOACAE    Report Status 01/26/2020 FINAL  Final   Organism ID, Bacteria STAPHYLOCOCCUS AUREUS  Final   Organism ID, Bacteria ENTEROBACTER CLOACAE  Final      Susceptibility   Enterobacter cloacae - MIC*    CEFAZOLIN >=64 RESISTANT Resistant     CEFEPIME <=0.12 SENSITIVE Sensitive     CEFTAZIDIME >=64 RESISTANT Resistant     CIPROFLOXACIN <=0.25 SENSITIVE Sensitive     GENTAMICIN <=1 SENSITIVE Sensitive     IMIPENEM 0.5  SENSITIVE Sensitive     TRIMETH/SULFA <=20 SENSITIVE Sensitive     PIP/TAZO >=128 RESISTANT Resistant     * FEW ENTEROBACTER CLOACAE   Staphylococcus aureus - MIC*    CIPROFLOXACIN <=0.5 SENSITIVE Sensitive     ERYTHROMYCIN 0.5 SENSITIVE Sensitive     GENTAMICIN <=0.5 SENSITIVE Sensitive     OXACILLIN 0.5 SENSITIVE Sensitive     TETRACYCLINE <=1 SENSITIVE Sensitive     VANCOMYCIN 1 SENSITIVE Sensitive     TRIMETH/SULFA <=10 SENSITIVE Sensitive  CLINDAMYCIN <=0.25 SENSITIVE Sensitive     RIFAMPIN <=0.5 SENSITIVE Sensitive     Inducible Clindamycin NEGATIVE Sensitive     * MODERATE STAPHYLOCOCCUS AUREUS  Culture, Urine     Status: Abnormal   Collection Time: 01/24/20  2:02 AM   Specimen: Urine, Catheterized  Result Value Ref Range Status   Specimen Description URINE, CATHETERIZED  Final   Special Requests   Final    NONE Performed at [facility], [address], [place], [place] [address]    Culture MULTIPLE SPECIES PRESENT, SUGGEST RECOLLECTION (A)  Final   Report Status 01/25/2020 FINAL  Final  Culture, blood (routine x 2)     Status: None   Collection Time: 01/24/20  2:55 AM   Specimen: BLOOD  Result Value Ref Range Status   Specimen Description BLOOD LEFT ANTECUBITAL  Final   Special Requests   Final    BOTTLES DRAWN AEROBIC ONLY Blood Culture results may not be optimal due to an inadequate volume of blood received in culture bottles   Culture   Final    NO GROWTH 5 DAYS Performed at [facility], [address], [place], [place] [address]    Report Status 01/29/2020 FINAL  Final  C Difficile Quick Screen w PCR reflex     Status: None   Collection Time: 01/28/20  3:01 PM   Specimen: STOOL  Result Value Ref Range Status   C Diff antigen NEGATIVE NEGATIVE Final   C Diff toxin NEGATIVE NEGATIVE Final   C Diff interpretation No C. difficile detected.  Final    Comment: Performed at [facility], [address], [place], [place] [address]      Radiology Studies: No results found.   Pamella Pert, MD, PhD [facility]  Between 7 am - 7 pm I am available, please contact me via Amion or Securechat  Between 7 pm - 7 am I am not available, please contact night coverage MD/APP via Amion

## 2020-02-01 NOTE — Progress Notes (Signed)
  Speech Language Pathology Treatment: Dysphagia  Patient Details Name: Natasha Jacobson MRN: [id number] DOB: [birth year] Today's Date: 02/01/2020 Time: 8144-8185 SLP Time Calculation (min) (ACUTE ONLY): 15 min  Assessment / Plan / Recommendation Clinical Impression  Pt seen at bedside for assessment of diet tolerance, readiness to advance, and pt/family education. Pt requesting thin liquids. Nursing reports pt has demonstrated tolerance of thin liquid trials and whole meds with liquid. Pt was given thin liquid via straw, and did not exhibit overt s/s aspiration. MBS results were discussed with pt and family, and safe swallow precautions updated at [facility]. Pt declined trial of dys 2 or dys 3 solids at this time. Will advance liquids only at this time, and continue to follow to assess readiness to advance solid textures. RN and MD informed.   HPI HPI: Pt is a 69 yo female admitted with hemorrhagic shock 2/2 bleeding 45mm pseudoaneurysm and recent instrumentation s/p EGD and coil embolization by IR 4/10. Course also significant for UTI, acute on chronic pancreatitis, and ETT 4/10-4/11; reintubated for encephalopathy and hypoxia 4/12-4/16. BSE 4/17 recommended NPO. Pt reintubated 4/19-4/22 due to respiratory distress. PMH also includes: COPD, hearing loss, HTN, thyroid disease.      SLP Plan  Continue with current plan of care       Recommendations  Diet recommendations: Dysphagia 1 (puree);Thin liquid Liquids provided via: Straw;Cup Medication Administration: Whole meds with liquid Supervision: Staff to assist with self feeding;Patient able to self feed Postural Changes and/or Swallow Maneuvers: Seated upright 90 degrees                Oral Care Recommendations: Oral care BID Follow up Recommendations: Skilled Nursing facility SLP Visit Diagnosis: Dysphagia, unspecified (R13.10) Plan: Continue with current plan of care       GO               Ociel Retherford B. Murvin Natal, [facility], CCC-SLP Speech  Language Pathologist Office: [phone] Pager: [phone]  Leigh Aurora 02/01/2020, 1:54 PM

## 2020-02-01 NOTE — Progress Notes (Signed)
Occupational Therapy Treatment Patient Details Name: Natasha Jacobson MRN: [id number] DOB: Aug 13, 1951 Today's Date: 02/01/2020    History of present illness Pt is a 69 yo female admitted with hemorrhagic shock 2/2 bleeding 32mm pseudoaneurysm and recent instrumentation s/p EGD and coil embolization by IR 4/10. Course also significant for UTI, acute on chronic pancreatitis, and ETT 4/10-4/11; reintubated for encephalopathy and hypoxia 4/12-4/16. PMH also includes: COPD, hearing loss, HTN, thyroid disease.  Reintubated 4/19 and extubated again 4/23.   OT comments  Pt making slow progress toward stated goals. Pt is self limiting and anxious in session. Had pt complete self feeding (thickened liquids) with max encouragement. She then was able to roll to the R at mod A 3 times to place bed pad. Pt able to perform 3-5 bil ceiling punches. Pt with cognitive deficits in awareness, orientation to time, problem solving, and awareness of deficits. Pt was able to recall phone number to dial her sister while OT read aloud no >3 numbers at a time. Will continue to follow to progress BADL participation. D/c remains appropriate.    Follow Up Recommendations  SNF;Supervision/Assistance - 24 hour    Equipment Recommendations  Other (comment)(defer to next venue)    Recommendations for Other Services      Precautions / Restrictions Precautions Precautions: Fall Precaution Comments: cortrak, flexiseal Restrictions Weight Bearing Restrictions: No       Mobility Bed Mobility Overal bed mobility: Needs Assistance   Rolling: Mod assist         General bed mobility comments: pt able to roll to R x3 with mod A and pulling on bed rails. Deffered further mobility depite significant encouragement and education  Transfers                      Balance                                           ADL either performed or assessed with clinical judgement   ADL Overall ADL's : Needs  assistance/impaired Eating/Feeding: Minimal assistance;Sitting;Bed level Eating/Feeding Details (indicate cue type and reason): pt cleared for thickened POs. with encouragement is able to bring cup to mouth. Pt will quickly state she is not able, but then engage with encouragement Grooming: Minimal assistance;Sitting;Bed level               Lower Body Dressing: Moderate assistance;Bed level Lower Body Dressing Details (indicate cue type and reason): to pull up and fix socks while in supine               General ADL Comments: pt is self limiting in mobility, resistive to tasks presented without significant encouragement     Vision Patient Visual Report: No change from baseline     Perception     Praxis      Cognition Arousal/Alertness: Awake/alert Behavior During Therapy: Restless Overall Cognitive Status: Impaired/Different from baseline Area of Impairment: Orientation;Attention;Memory;Following commands;Safety/judgement;Awareness;Problem solving                 Orientation Level: Disoriented to;Time Current Attention Level: Sustained Memory: Decreased short-term memory Following Commands: Follows one step commands inconsistently;Follows one step commands with increased time Safety/Judgement: Decreased awareness of safety;Decreased awareness of deficits   Problem Solving: Slow processing;Requires verbal cues General Comments: not able to orient self to time without looking at calendar. Increased time and cueing  needed for basic tasks. Pt was able to recall 3 numbers given at a time for a phone number to dial to reach her daughter        Exercises Other Exercises Other Exercises: rolling to R in bed x3   Shoulder Instructions       General Comments      Pertinent Vitals/ Pain       Pain Assessment: Faces Faces Pain Scale: Hurts little more Pain Location: chest Pain Descriptors / Indicators: Grimacing Pain Intervention(s): Monitored during  session;Repositioned  Home Living                                          Prior Functioning/Environment              Frequency  Min 2X/week        Progress Toward Goals  OT Goals(current goals can now be found in the care plan section)  Progress towards OT goals: Progressing toward goals  Acute Rehab OT Goals Patient Stated Goal: none stated today  OT Goal Formulation: With patient Time For Goal Achievement: 02/06/20 Potential to Achieve Goals: [place] Discharge plan remains appropriate;Frequency needs to be updated    Co-evaluation                 AM-PAC OT "6 Clicks" Daily Activity     Outcome Measure   Help from another person eating meals?: A Little Help from another person taking care of personal grooming?: A Little Help from another person toileting, which includes using toliet, bedpan, or urinal?: A Lot Help from another person bathing (including washing, rinsing, drying)?: A Lot Help from another person to put on and taking off regular upper body clothing?: A Lot Help from another person to put on and taking off regular lower body clothing?: A Lot 6 Click Score: 14    End of Session    OT Visit Diagnosis: Other abnormalities of gait and mobility (R26.89);Muscle weakness (generalized) (M62.81);Pain   Activity Tolerance Patient limited by fatigue;Patient limited by pain   Patient Left in bed;with call bell/phone within reach;with bed alarm set   Nurse Communication Mobility status        Time: 1638-4665 OT Time Calculation (min): 17 min  Charges: OT General Charges $OT Visit: 1 Visit OT Treatments $Self Care/Home Management : 8-22 mins  Zenovia Jarred, MSOT, OTR/L [facility] Office Number: [phone] Pager: [phone]  Zenovia Jarred 02/01/2020, 2:27 PM

## 2020-02-02 DIAGNOSIS — G9340 Encephalopathy, unspecified: Secondary | ICD-10-CM | POA: Diagnosis not present

## 2020-02-02 DIAGNOSIS — Z66 Do not resuscitate: Secondary | ICD-10-CM | POA: Diagnosis not present

## 2020-02-02 DIAGNOSIS — Z515 Encounter for palliative care: Secondary | ICD-10-CM | POA: Diagnosis not present

## 2020-02-02 DIAGNOSIS — Z7189 Other specified counseling: Secondary | ICD-10-CM | POA: Diagnosis not present

## 2020-02-02 DIAGNOSIS — K859 Acute pancreatitis without necrosis or infection, unspecified: Secondary | ICD-10-CM | POA: Diagnosis not present

## 2020-02-02 DIAGNOSIS — N179 Acute kidney failure, unspecified: Secondary | ICD-10-CM | POA: Diagnosis not present

## 2020-02-02 DIAGNOSIS — R0603 Acute respiratory distress: Secondary | ICD-10-CM | POA: Diagnosis not present

## 2020-02-02 DIAGNOSIS — K860 Alcohol-induced chronic pancreatitis: Secondary | ICD-10-CM | POA: Diagnosis not present

## 2020-02-02 LAB — COMPREHENSIVE METABOLIC PANEL
ALT: 21 U/L (ref 0–44)
AST: 24 U/L (ref 15–41)
Albumin: 1.1 g/dL — ABNORMAL LOW (ref 3.5–5.0)
Alkaline Phosphatase: 82 U/L (ref 38–126)
Anion gap: 7 (ref 5–15)
BUN: 32 mg/dL — ABNORMAL HIGH (ref 8–23)
CO2: 18 mmol/L — ABNORMAL LOW (ref 22–32)
Calcium: 7.5 mg/dL — ABNORMAL LOW (ref 8.9–10.3)
Chloride: 112 mmol/L — ABNORMAL HIGH (ref 98–111)
Creatinine, Ser: 1.08 mg/dL — ABNORMAL HIGH (ref 0.44–1.00)
GFR calc Af Amer: >60 mL/min (ref >60)
GFR calc non Af Amer: 53 mL/min — ABNORMAL LOW (ref >60)
Glucose, Bld: 131 mg/dL — ABNORMAL HIGH (ref 70–99)
Potassium: 4.2 mmol/L (ref 3.5–5.1)
Sodium: 137 mmol/L (ref 135–145)
Total Bilirubin: 0.4 mg/dL (ref 0.3–1.2)
Total Protein: 4.1 g/dL — ABNORMAL LOW (ref 6.5–8.1)

## 2020-02-02 LAB — CBC
HCT: 26.9 % — ABNORMAL LOW (ref 36.0–46.0)
Hemoglobin: 8.4 g/dL — ABNORMAL LOW (ref 12.0–15.0)
MCH: 30.2 pg (ref 26.0–34.0)
MCHC: 31.2 g/dL (ref 30.0–36.0)
MCV: 96.8 fL (ref 80.0–100.0)
Platelets: 401 10*3/uL — ABNORMAL HIGH (ref 150–400)
RBC: 2.78 MIL/uL — ABNORMAL LOW (ref 3.87–5.11)
RDW: 18.3 % — ABNORMAL HIGH (ref 11.5–15.5)
WBC: 32 10*3/uL — ABNORMAL HIGH (ref 4.0–10.5)
nRBC: 0.8 % — ABNORMAL HIGH (ref 0.0–0.2)

## 2020-02-02 LAB — PHOSPHORUS: Phosphorus: 3.7 mg/dL (ref 2.5–4.6)

## 2020-02-02 LAB — GLUCOSE, CAPILLARY
Glucose-Capillary: 104 mg/dL — ABNORMAL HIGH (ref 70–99)
Glucose-Capillary: 108 mg/dL — ABNORMAL HIGH (ref 70–99)
Glucose-Capillary: 115 mg/dL — ABNORMAL HIGH (ref 70–99)
Glucose-Capillary: 131 mg/dL — ABNORMAL HIGH (ref 70–99)
Glucose-Capillary: 202 mg/dL — ABNORMAL HIGH (ref 70–99)
Glucose-Capillary: 239 mg/dL — ABNORMAL HIGH (ref 70–99)

## 2020-02-02 LAB — MAGNESIUM: Magnesium: 1.5 mg/dL — ABNORMAL LOW (ref 1.7–2.4)

## 2020-02-02 MED ORDER — HYDROMORPHONE HCL 2 MG PO TABS
2.0000 mg | ORAL_TABLET | ORAL | Status: DC | PRN
  Administered 2020-02-02 – 2020-02-08 (×17): 2 mg via ORAL
  Filled 2020-02-02 (×17): qty 1

## 2020-02-02 MED ORDER — MAGIC MOUTHWASH
5.0000 mL | Freq: Four times a day (QID) | ORAL | Status: DC
  Administered 2020-02-02 – 2020-02-07 (×12): 5 mL via ORAL
  Filled 2020-02-02 (×33): qty 5

## 2020-02-02 MED ORDER — HYDROMORPHONE HCL ER 8 MG PO T24A
24.0000 mg | EXTENDED_RELEASE_TABLET | ORAL | Status: DC
  Administered 2020-02-02 – 2020-02-08 (×7): 24 mg via ORAL
  Filled 2020-02-02 (×7): qty 3

## 2020-02-02 MED ORDER — CHOLESTYRAMINE 4 G PO PACK
4.0000 g | PACK | Freq: Two times a day (BID) | ORAL | Status: DC
  Filled 2020-02-02 (×2): qty 1

## 2020-02-02 MED ORDER — VITAL 1.5 CAL PO LIQD
720.0000 mL | ORAL | Status: DC
  Administered 2020-02-02 – 2020-02-05 (×4): 720 mL
  Filled 2020-02-02 (×4): qty 948

## 2020-02-02 MED ORDER — OXYCODONE HCL 5 MG PO TABS
10.0000 mg | ORAL_TABLET | ORAL | Status: DC | PRN
  Administered 2020-02-02 (×2): 10 mg via ORAL
  Filled 2020-02-02 (×2): qty 2

## 2020-02-02 MED ORDER — ENSURE ENLIVE PO LIQD
237.0000 mL | Freq: Three times a day (TID) | ORAL | Status: DC
  Administered 2020-02-03 – 2020-02-05 (×3): 237 mL via ORAL

## 2020-02-02 NOTE — Progress Notes (Signed)
PROGRESS NOTE  Natasha Jacobson PPJ:[id number] DOB: [birth year] DOA: 01/13/2020 PCP: Ardyth Gal, MD   LOS: 20 days   Brief Narrative / Interim history: 69 year old female with history of chronic alcoholic pancreatitis, status post a recent pancreatico gastrostomy stent at [facility] on 01/12/20, admitted here with acute GI bleed due to splenic artery pseudoaneurysm status post embolization.  She presented to [place] with recurrent abdominal pain and hematemesis, and hospital course was complicated by hypotension and hemorrhagic shock requiring multiple PRBC transfusion, invasive mechanical ventilation as well as vasopressors.  GI was consulted and underwent an EGD which showed active upper GI bleeding, and a follow-up CT angiogram showed a 7 mm pseudoaneurysm over the pancreatic branch of the splenic artery.  IR was consulted due to splenic angiogram and coil embolization of the pseudoaneurysm.  She failed extubation on April 11 and had to be reintubated on April 12 due to increased work of breathing and respiratory distress, eventually being extubated.  Failed again extubation on April 16 and had to be reintubated April 19, she was diagnosed with left-sided pneumonia at that time and placed on antibiotics.  She was eventually extubated April/22nd, transferred to [facility] on April/23rd.  Subjective / 24h Interval events: Still has abdominal pain, has been eating a little bit more, no vomiting  Assessment & Plan: Principal Problem Acute on chronic alcoholic pancreatitis, complicated with hemorrhagic shock and acute blood loss anemia due to upper GI bleed, POA-this is due to a splenic artery pseudoaneurysm, now status post embolization by IR on 4/20. -Her abdominal pain is persistent, however expect a chronic component at this point.  She has been cleared for dysphagia 1 diet however she has poor p.o. intake and currently is on tube feeds as well -Has persistent leukocytosis, afebrile in no apparent active  infectious sources, closely monitor.  CT scan of the abdomen on 4/24 without acute findings -Continue pain control with scheduled Tylenol, as needed oxycodone and Dilaudid, palliative care following, appreciate input.  Attempt to wean off IV pain medications.  Will discuss with dietary about when can I discontinue the feeding tube  Active Problems Acute hypoxic respiratory failure due to left lower lobe aspiration pneumonia-now off mechanical ventilation, respiratory cultures showed MSSA and Enterobacter, status post completed treatment with linezolid and cefepime. -Continue oxymetry monitoring and aspiration precautions, as needed bronchodilators and as tolerated airway clearing techniques with flutter valve and incentive spirometer.   AKI with hypernatremia/ hypokalemia/ hyperchloremia/ hyperchloremic non gap metabolic acidosis-patient has had significant diarrhea, C. difficile was negative.  Creatinine has remained stable  Acute metabolic encephalopathy -likely component of ICU delirium as well, continues to improve  Severe calorie protein malnutrition-Continue with Nutritional supplementation, thiamine and multivitamins.  Hypothyroid-Continuelevothyroxine  T2DM(Hgb A1c 5,4)-continue sliding scale   Scheduled Meds: . acetaminophen (TYLENOL) oral liquid 160 mg/5 mL  650 mg Per Tube Q6H  . Chlorhexidine Gluconate Cloth  6 each Topical Daily  . feeding supplement (PRO-STAT SUGAR FREE 64)  30 mL Per Tube Daily  . free water  250 mL Per Tube Q4H  . geriatric multivitamins-minerals  15 mL Per Tube Daily  . insulin aspart  0-6 Units Subcutaneous Q4H  . levothyroxine  50 mcg Per Tube Q0600  . lipase/protease/amylase  12,000 Units Oral TID AC  . melatonin  3 mg Oral QHS  . oxyCODONE  10 mg Oral Q12H  . pantoprazole  40 mg Oral BID  . potassium chloride  40 mEq Oral Once  . pregabalin  75 mg Oral  BID  . sodium chloride flush  10-40 mL Intracatheter Q12H  . thiamine  100 mg Per  Tube Daily  . traZODone  50 mg Oral QHS   Continuous Infusions: . feeding supplement (VITAL 1.5 CAL) 1,000 mL (02/01/20 0520)   PRN Meds:.albuterol, Gerhardt's butt cream, guaiFENesin-dextromethorphan, HYDROmorphone (DILAUDID) injection, ipratropium-albuterol, ondansetron (ZOFRAN) IV, oxyCODONE, Resource ThickenUp Clear, sodium chloride flush  DVT prophylaxis: SCDs Code Status: DNR Family Communication: no family at bedside  Patient admitted from: home Anticipated d/c place: SNF Barriers to d/c: Remains quite weak and deconditioned, still getting tube feeds and IV pain medications, attempt to advance diet, DC core track and transition to oral pain agents  Consultants:  Palliative care Critical care  Procedures:  4/9 admitted; mass transfusion protocol 4/10 transfer to ICU; EGD, IR embolization 4/11 extubated 4/12 white count remains elevated at 39k, LFTs significantly elevated; continues to have dark red stools; hgb stable; minimal responsiveness; reintubated for increased work of breathing 4/13 LFTs improving. hgb stable. More responsive this morning. Minimal improvement in respiratory status. Increased BRBPR>>EGD 4/14 LFTs, renal function improving. More encephalopathic this morning. hgb stable. 4/15continues to be severely encephalopathic>head CT neg; progressive hypernatremia 148>151 4/16 improved mental status, improved renal function; extubated; off pressors 4/17 remains encephalopathic 4/18 D/c to floor 4/19 Re-intubated and re-admitted to ICU 4/22 extubated 4/23 transferred to [facility]  Microbiology  Respiratory culture 4/20-MSSA, Enterobacter Cloacae  Antimicrobials: None     Objective: Vitals:   02/02/20 0318 02/02/20 0643 02/02/20 0718 02/02/20 1055  BP:   115/71 120/60  Pulse:   81 89  Resp:   17 19  Temp: 98.1 F (36.7 C)  98.2 F (36.8 C) 98.3 F (36.8 C)  TempSrc: Oral  Oral Oral  SpO2:   100% 99%  Weight:  59.2 kg    Height:        Intake/Output  Summary (Last 24 hours) at 02/02/2020 1131 Last data filed at 02/01/2020 1850 Gross per 24 hour  Intake 480 ml  Output --  Net 480 ml   Filed Weights   01/31/20 0419 02/01/20 0340 02/02/20 0643  Weight: 58.4 kg 60 kg 59.2 kg    Examination:  Constitutional: No distress Eyes: No icterus ENMT: Moist mucous membranes Neck: normal, supple Respiratory: Clear bilaterally, no wheezing, no crackles, normal respiratory effort Cardiovascular: Regular rate and rhythm, no murmurs, no edema Abdomen: Remains quite tender to palpation, bowel sounds positive Musculoskeletal: no clubbing / cyanosis.  Skin: No rashes seen Neurologic: Nonfocal  Data Reviewed: I have independently reviewed following labs and imaging studies   CBC: Recent Labs  Lab 01/28/20 0420 01/28/20 0420 01/29/20 0500 01/30/20 0306 01/31/20 0513 02/01/20 0500 02/02/20 0627  WBC 43.1*   < > 44.1* 44.6* 44.7* 35.8* 32.0*  NEUTROABS 38.1*  --  36.5* 38.6* 37.2* 30.4*  --   HGB 9.6*   < > 9.9* 9.4* 8.8* 8.8* 8.4*  HCT 29.4*   < > 30.3* 29.2* 28.6* 27.4* 26.9*  MCV 95.5   < > 93.8 94.2 98.3 97.2 96.8  PLT 305   < > 309 261 263 377 401*   < > = values in this interval not displayed.   Basic Metabolic Panel: Recent Labs  Lab 01/27/20 0549 01/27/20 1200 01/29/20 0500 01/30/20 0306 01/31/20 0513 02/01/20 0500 02/02/20 0627  NA  --    < > 149* 149* 142 141 137  K  --    < > 2.7* 3.4* 4.5 3.4* 4.2  CL  --    < >  120* 122* 119* 114* 112*  CO2  --    < > 20* 20* 15* 19* 18*  GLUCOSE  --    < > 128* 156* 93 142* 131*  BUN  --    < > 51* 46* 42* 37* 32*  CREATININE  --    < > 1.47* 1.28* 1.23* 1.17* 1.08*  CALCIUM  --    < > 7.4* 7.2* 7.3* 7.8* 7.5*  MG 2.0  --  1.9 1.7 1.9  --  1.5*  PHOS  --   --   --   --   --   --  3.7   < > = values in this interval not displayed.   Liver Function Tests: Recent Labs  Lab 01/30/20 1350 02/02/20 0627  AST  --  24  ALT  --  21  ALKPHOS  --  82  BILITOT  --  0.4  PROT  --   4.1*  ALBUMIN 1.2* 1.1*   Coagulation Profile: No results for input(s): INR, PROTIME in the last 168 hours. HbA1C: No results for input(s): HGBA1C in the last 72 hours. CBG: Recent Labs  Lab 02/01/20 1939 02/01/20 2339 02/02/20 0321 02/02/20 0854 02/02/20 1102  GLUCAP 156* 123* 115* 202* 104*    Recent Results (from the past 240 hour(s))  Culture, blood (routine x 2)     Status: None   Collection Time: 01/23/20 11:28 PM   Specimen: BLOOD LEFT HAND  Result Value Ref Range Status   Specimen Description BLOOD LEFT HAND  Final   Special Requests   Final    BOTTLES DRAWN AEROBIC AND ANAEROBIC Blood Culture results may not be optimal due to an inadequate volume of blood received in culture bottles   Culture   Final    NO GROWTH 5 DAYS Performed at [facility], [place] [address] [place], [place] [address]    Report Status 01/29/2020 FINAL  Final  Culture, respiratory (non-expectorated)     Status: None   Collection Time: 01/24/20 12:30 AM   Specimen: Tracheal Aspirate; Respiratory  Result Value Ref Range Status   Specimen Description TRACHEAL ASPIRATE  Final   Special Requests NONE  Final   Gram Stain   Final    MODERATE WBC PRESENT, PREDOMINANTLY PMN ABUNDANT GRAM POSITIVE COCCI IN CHAINS IN CLUSTERS FEW GRAM POSITIVE RODS FEW GRAM NEGATIVE RODS Performed at [facility], [place] [address], [place], [place] [address]    Culture   Final    MODERATE STAPHYLOCOCCUS AUREUS FEW ENTEROBACTER CLOACAE    Report Status 01/26/2020 FINAL  Final   Organism ID, Bacteria STAPHYLOCOCCUS AUREUS  Final   Organism ID, Bacteria ENTEROBACTER CLOACAE  Final      Susceptibility   Enterobacter cloacae - MIC*    CEFAZOLIN >=64 RESISTANT Resistant     CEFEPIME <=0.12 SENSITIVE Sensitive     CEFTAZIDIME >=64 RESISTANT Resistant     CIPROFLOXACIN <=0.25 SENSITIVE Sensitive     GENTAMICIN <=1 SENSITIVE Sensitive     IMIPENEM 0.5 SENSITIVE Sensitive     TRIMETH/SULFA <=20 SENSITIVE  Sensitive     PIP/TAZO >=128 RESISTANT Resistant     * FEW ENTEROBACTER CLOACAE   Staphylococcus aureus - MIC*    CIPROFLOXACIN <=0.5 SENSITIVE Sensitive     ERYTHROMYCIN 0.5 SENSITIVE Sensitive     GENTAMICIN <=0.5 SENSITIVE Sensitive     OXACILLIN 0.5 SENSITIVE Sensitive     TETRACYCLINE <=1 SENSITIVE Sensitive     VANCOMYCIN 1  SENSITIVE Sensitive     TRIMETH/SULFA <=10 SENSITIVE Sensitive     CLINDAMYCIN <=0.25 SENSITIVE Sensitive     RIFAMPIN <=0.5 SENSITIVE Sensitive     Inducible Clindamycin NEGATIVE Sensitive     * MODERATE STAPHYLOCOCCUS AUREUS  Culture, Urine     Status: Abnormal   Collection Time: 01/24/20  2:02 AM   Specimen: Urine, Catheterized  Result Value Ref Range Status   Specimen Description URINE, CATHETERIZED  Final   Special Requests   Final    NONE Performed at [facility], [address] [place], [place] [address]    Culture MULTIPLE SPECIES PRESENT, SUGGEST RECOLLECTION (A)  Final   Report Status 01/25/2020 FINAL  Final  Culture, blood (routine x 2)     Status: None   Collection Time: 01/24/20  2:55 AM   Specimen: BLOOD  Result Value Ref Range Status   Specimen Description BLOOD LEFT ANTECUBITAL  Final   Special Requests   Final    BOTTLES DRAWN AEROBIC ONLY Blood Culture results may not be optimal due to an inadequate volume of blood received in culture bottles   Culture   Final    NO GROWTH 5 DAYS Performed at [facility], [address] [place], [place] [address]    Report Status 01/29/2020 FINAL  Final  C Difficile Quick Screen w PCR reflex     Status: None   Collection Time: 01/28/20  3:01 PM   Specimen: STOOL  Result Value Ref Range Status   C Diff antigen NEGATIVE NEGATIVE Final   C Diff toxin NEGATIVE NEGATIVE Final   C Diff interpretation No C. difficile detected.  Final    Comment: Performed at [facility], [address] [place], [place] [address]     Radiology Studies: No results found.   Pamella Pert,  MD, PhD [facility]  Between 7 am - 7 pm I am available, please contact me via Amion or Securechat  Between 7 pm - 7 am I am not available, please contact night coverage MD/APP via Amion

## 2020-02-02 NOTE — Progress Notes (Signed)
Daily Progress Note   Patient Name: Natasha Jacobson       Date: 02/02/2020 DOB: Feb 17, 1951  Age: 69 y.o. MRN#: [id number] Attending Physician: Caren Griffins, MD Primary Care Physician: Concepcion Elk, MD Admit Date: 01/13/2020  Reason for Consultation/Follow-up: Establishing goals of care  Subjective: Patient lying in bed. States she continues to feel bad- has had no relief from pain today with oral oxycodone. We discussed rotating medications- trying oral hydromorphone rather than oxycodone. She is eager to try this. She is having copious diarrhea.  Review of Systems  Constitutional: Positive for malaise/fatigue and weight loss.  Gastrointestinal: Positive for abdominal pain.    Length of Stay: 20  Current Medications: Scheduled Meds:  . acetaminophen (TYLENOL) oral liquid 160 mg/5 mL  650 mg Per Tube Q6H  . Chlorhexidine Gluconate Cloth  6 each Topical Daily  . cholestyramine  4 g Oral BID  . feeding supplement (ENSURE ENLIVE)  237 mL Oral TID BM  . feeding supplement (PRO-STAT SUGAR FREE 64)  30 mL Per Tube Daily  . feeding supplement (VITAL 1.5 CAL)  720 mL Per Tube Q24H  . free water  250 mL Per Tube Q4H  . geriatric multivitamins-minerals  15 mL Per Tube Daily  . HYDROmorphone HCl  24 mg Oral Q24H  . insulin aspart  0-6 Units Subcutaneous Q4H  . levothyroxine  50 mcg Per Tube Q0600  . lipase/protease/amylase  12,000 Units Oral TID AC  . melatonin  3 mg Oral QHS  . pantoprazole  40 mg Oral BID  . potassium chloride  40 mEq Oral Once  . pregabalin  75 mg Oral BID  . sodium chloride flush  10-40 mL Intracatheter Q12H  . thiamine  100 mg Per Tube Daily  . traZODone  50 mg Oral QHS    Continuous Infusions:   PRN Meds: albuterol, Gerhardt's butt cream,  guaiFENesin-dextromethorphan, HYDROmorphone, ipratropium-albuterol, ondansetron (ZOFRAN) IV, Resource ThickenUp Clear, sodium chloride flush  Physical Exam Vitals and nursing note reviewed.  Cardiovascular:     Rate and Rhythm: Normal rate and regular rhythm.  Pulmonary:     Effort: Pulmonary effort is normal.  Neurological:     Mental Status: She is alert and oriented to person, place, and time.  Psychiatric:        Mood and Affect: Mood  normal.        Behavior: Behavior normal.             Vital Signs: BP (!) 115/53 (BP Location: Left Arm)   Pulse 82   Temp 98.6 F (37 C) (Oral)   Resp 19   Ht 5\' 4"  (1.626 m)   Wt 59.2 kg   SpO2 96%   BMI 22.40 kg/m  SpO2: SpO2: 96 % O2 Device: O2 Device: Nasal Cannula O2 Flow Rate: O2 Flow Rate (L/min): 4 L/min  Intake/output summary:   Intake/Output Summary (Last 24 hours) at 02/02/2020 1658 Last data filed at 02/02/2020 1621 Gross per 24 hour  Intake 2150 ml  Output 900 ml  Net 1250 ml   LBM: Last BM Date: 02/02/20 Baseline Weight: Weight: 50.7 kg Most recent weight: Weight: 59.2 kg       Palliative Assessment/Data: PPS: 50%   Flowsheet Rows     Most Recent Value  Intake Tab  Referral Department  Critical care  Unit at Time of Referral  ICU  Date Notified  01/24/20  Palliative Care Type  New Palliative care  Reason for referral  Clarify Goals of Care  Date of Admission  01/13/20  Date first seen by Palliative Care  01/25/20  # of days Palliative referral response time  1 Day(s)  # of days IP prior to Palliative referral  11  Clinical Assessment  Psychosocial & Spiritual Assessment  Palliative Care Outcomes      Patient Active Problem List   Diagnosis Date Noted  . Acute respiratory distress   . Palliative care encounter   . Protein-calorie malnutrition, severe 01/27/2020  . HAP (hospital-acquired pneumonia)   . Septic shock (HCC)   . Goals of care, counseling/discussion   . Palliative care by specialist    . DNR (do not resuscitate)   . Advanced care planning/counseling discussion   . Acute encephalopathy   . Hemorrhagic shock (HCC)   . AKI (acute kidney injury) (HCC)   . Anemia   . Essential hypertension 01/14/2020  . Acute pancreatitis 01/14/2020  . Gastrointestinal hemorrhage   . Acute respiratory failure (HCC)   . Pancreatitis, acute 01/13/2020  . Generalized anxiety disorder 12/20/2019  . 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 Chronic pain syndrome 08/02/2018  . Chronic hepatitis C without hepatic coma (HCC) 08/18/2017  . Hypothyroidism 07/08/2016  . Hearing loss, right 07/08/2016  . COPD (chronic obstructive pulmonary disease) (HCC) 09/06/2015  . SSS (sick sinus syndrome) (HCC) 09/06/2015    Palliative Care Assessment & Plan   Patient Profile: 69 y.o.femalewith past medical history of COPD,chronic Hep C,chronic pancreatitis, anxiety, chronic pain,pancreatic gastrostomy stent exchanged on 4/8admitted on 4/9/2021with hematemesis, red bloody bowel movements, abdominal pain, hypotension and low Hgb. Clinically worsened on 4/10 requiring intubation. 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 Urine + e. Faecalis. Evidence of possible renal infarct on CT scan. She was extubated 4/11, reintubated 4/12, extubated 4/16, and reintubated 4/19. Now with pneumonia and septic shock requiring vasopressors. Hypoalbumin at 1.3. Transaminases at creatinine are both trending up. She has remained encephalopathic during entire admission. Palliative medicine consulted for goals of care.Mental status is greatly improved- she is working towards going to SNF.   Assessment/Recommendations/Plan  Pain plan- she has had a total of 130 morphine milliequivalents in the last 24 hours. This  equates to 32.5mg  PO hydromorphone 24 hr dose. Dose reduced for cross tolerance by 20% = 26mg . Will start Exalgo 24 mg (2 12mg  pills nightly) with 2mg  po hydromorphone q4hr prn for breakthrough pain. D/C IV pain medication- if she does not find some relief with this regimen- may consider treatment with methadone Questran 4g or PO BID for diarrhea Continue pancreatic enzymes Continue pregabalin 75 mg po BID Patient is hopeful that pain relief will allow her to participate with PT tomorrow  Recommend Palliative continue to follow at facility for goals of care and symptom management  Goals of Care and Additional Recommendations: Limitations on Scope of Treatment: Full Scope Treatment  Code Status: DNR  Prognosis:  Unable to determine  Discharge Planning: Skilled Nursing Facility for rehab with Palliative care service follow-up  Care plan was discussed with patient.   Thank you for allowing the Palliative Medicine Team to assist in the care of this patient.   Time In: 1630 Time Out: 1715 Total Time 45 minutes Prolonged Time Billed no      Greater than 50%  of this time was spent counseling and coordinating care related to the above assessment and plan.  , AGNP-C Palliative Medicine   Please contact Palliative Medicine Team phone at [phone] for questions and concerns.

## 2020-02-02 NOTE — Plan of Care (Signed)
  Problem: Education: Goal: Knowledge of General Education information will improve Description: Including pain rating scale, medication(s)/side effects and non-pharmacologic comfort measures Outcome: Progressing   Problem: Health Behavior/Discharge Planning: Goal: Ability to manage health-related needs will improve Outcome: Progressing   Problem: Clinical Measurements: Goal: Ability to maintain clinical measurements within normal limits will improve Outcome: Progressing Goal: Will remain free from infection Outcome: Progressing Goal: Diagnostic test results will improve Outcome: Progressing Goal: Respiratory complications will improve Outcome: Progressing Goal: Cardiovascular complication will be avoided Outcome: Progressing   Problem: Activity: Goal: Risk for activity intolerance will decrease Outcome: Progressing   Problem: Nutrition: Goal: Adequate nutrition will be maintained Outcome: Progressing   Problem: Coping: Goal: Level of anxiety will decrease Outcome: Progressing   Problem: Elimination: Goal: Will not experience complications related to bowel motility Outcome: Progressing Goal: Will not experience complications related to urinary retention Outcome: Progressing   Problem: Pain Managment: Goal: General experience of comfort will improve Outcome: Progressing   Problem: Safety: Goal: Ability to remain free from injury will improve Outcome: Progressing   Problem: Skin Integrity: Goal: Risk for impaired skin integrity will decrease Outcome: Progressing   Problem: Activity: Goal: Ability to tolerate increased activity will improve Outcome: Progressing   Problem: Respiratory: Goal: Ability to maintain a clear airway and adequate ventilation will improve Outcome: Progressing   Problem: Role Relationship: Goal: Method of communication will improve Outcome: Progressing   Problem: Education: Goal: Ability to identify signs and symptoms of  gastrointestinal bleeding will improve Outcome: Progressing   Problem: Bowel/Gastric: Goal: Will show no signs and symptoms of gastrointestinal bleeding Outcome: Progressing   Problem: Fluid Volume: Goal: Will show no signs and symptoms of excessive bleeding Outcome: Progressing   Problem: Clinical Measurements: Goal: Complications related to the disease process, condition or treatment will be avoided or minimized Outcome: Progressing   

## 2020-02-02 NOTE — Progress Notes (Signed)
Nutrition Follow-up  DOCUMENTATION CODES:   Severe malnutrition in context of chronic illness  INTERVENTION:   Transition to nocturnal tube feeds via Cortrak: - Vital 1.5 @ 60 ml/hr to run for 12 hours from 1800 to 0600 (total of 720 ml) - Pro-stat 30 ml daily - Free water 250 ml q 4 hours  Nocturnal tube feeding regimen provides 1180 kcal, 64 grams of protein, and 2050 ml of H2O (74% of kcal needs, 80% of protein needs).  - Ensure Enlive po TID, each supplement provides 350 kcal and 20 grams of protein  - Encourage adequate PO intake  - Provide feeding assistance as needed  NUTRITION DIAGNOSIS:   Severe Malnutrition related to chronic illness (chronic pancreatitis) as evidenced by severe fat depletion, severe muscle depletion.  Ongoing, being addressed via TF and supplements  GOAL:   Patient will meet greater than or equal to 90% of their needs  Progressing  MONITOR:   Diet advancement, Labs, Weight trends, TF tolerance, I & O's  REASON FOR ASSESSMENT:   Consult Other (please evaluate when the tube feeds can be discontinued)  ASSESSMENT:   69 y.o. female with history of chronic alcoholic pancreatitis s/p exchange of pancreatico-gastrostomy stent at [facility] on 01/12/20. She started developing abdominal pain later in the evening which continued to worsen and 4/9 she began having hematemesis. She also had dark stools following which patient decided to come to the ED. Pain has been continuous epigastric in nature radiating to the back. CT abdomen/pelvis in the ED showed concern for acute on chronic pancreatitis and UA concerning for UTI. No bed available at [facility] to transfer patient so ED MD talked with GI and patient was admitted at [facility].  04/12-reintubated for encephalopathy and hypoxia 04/13-s/p EGD 04/16-extubated, Cortrak placed (post-pyloric), off pressors, renal function improved 04/19-reintubated 04/22 - extubated 04/26 - s/p MBSS with  recommendations for dysphagia 1 diet with nectar-thick liquids  Cortrak remains in place with TF infusing. Per discussion with MD, will transition to nocturnal tube feeds to stimulate appetite during the day. Informed RN via phone call.  Diet advanced to dysphagia 1 with thin liquids yesterday.  Current weight: 59.2 kg Admit weight: 50.7 kg  Pt with mild pitting edema to BUE and BLE.  Meal Completion: 15-25% x 3 recorded meals  Medications reviewed and include: geriatric MVI, SSI q 4 hours, protonix, thiamine, Creon 12,000 units TID with meals  Labs reviewed: chloride 111, magnesium 1.5, hemoglobin 8.4 CBG's: 104-202 x 24 hours  Diet Order:   Diet Order            DIET - DYS 1 Room service appropriate? Yes; Fluid consistency: Thin  Diet effective now              EDUCATION NEEDS:   Not appropriate for education at this time  Skin:  Skin Assessment: Skin Integrity Issues: Other: MASD buttocks/groin  Last BM:  02/02/20 type 7 via rectal tube  Height:   Ht Readings from Last 1 Encounters:  01/14/20 5\' 4"  (1.626 m)    Weight:   Wt Readings from Last 1 Encounters:  02/02/20 59.2 kg    Ideal Body Weight:  54.5 kg  BMI:  Body mass index is 22.4 kg/m.  Estimated Nutritional Needs:   Kcal:  1600-1800  Protein:  80-95 grams  Fluid:  >/= 1.5 L    02/04/20, MS, RD, LDN Inpatient Clinical Dietitian Pager: [phone] Weekend/After Hours: [phone]

## 2020-02-03 DIAGNOSIS — N179 Acute kidney failure, unspecified: Secondary | ICD-10-CM | POA: Diagnosis not present

## 2020-02-03 DIAGNOSIS — G9340 Encephalopathy, unspecified: Secondary | ICD-10-CM | POA: Diagnosis not present

## 2020-02-03 DIAGNOSIS — R0603 Acute respiratory distress: Secondary | ICD-10-CM | POA: Diagnosis not present

## 2020-02-03 DIAGNOSIS — K859 Acute pancreatitis without necrosis or infection, unspecified: Secondary | ICD-10-CM | POA: Diagnosis not present

## 2020-02-03 LAB — GLUCOSE, CAPILLARY
Glucose-Capillary: 109 mg/dL — ABNORMAL HIGH (ref 70–99)
Glucose-Capillary: 121 mg/dL — ABNORMAL HIGH (ref 70–99)
Glucose-Capillary: 126 mg/dL — ABNORMAL HIGH (ref 70–99)
Glucose-Capillary: 140 mg/dL — ABNORMAL HIGH (ref 70–99)
Glucose-Capillary: 147 mg/dL — ABNORMAL HIGH (ref 70–99)
Glucose-Capillary: 93 mg/dL (ref 70–99)

## 2020-02-03 MED ORDER — LOPERAMIDE HCL 1 MG/7.5ML PO SUSP
2.0000 mg | Freq: Three times a day (TID) | ORAL | Status: DC | PRN
  Administered 2020-02-04: 2 mg via ORAL
  Filled 2020-02-03 (×3): qty 15

## 2020-02-03 NOTE — Progress Notes (Signed)
PROGRESS NOTE  Natasha Jacobson BBC:[id number] DOB: 1950-11-20 DOA: 01/13/2020 PCP: Ardyth Gal, MD   LOS: 21 days   Brief Narrative / Interim history: 69 year old female with history of chronic alcoholic pancreatitis, status post a recent pancreatico gastrostomy stent at [facility] on 01/12/20, admitted here with acute GI bleed due to splenic artery pseudoaneurysm status post embolization.  She presented to [place] with recurrent abdominal pain and hematemesis, and hospital course was complicated by hypotension and hemorrhagic shock requiring multiple PRBC transfusion, invasive mechanical ventilation as well as vasopressors.  GI was consulted and underwent an EGD which showed active upper GI bleeding, and a follow-up CT angiogram showed a 7 mm pseudoaneurysm over the pancreatic branch of the splenic artery.  IR was consulted due to splenic angiogram and coil embolization of the pseudoaneurysm.  She failed extubation on April 11 and had to be reintubated on April 12 due to increased work of breathing and respiratory distress, eventually being extubated.  Failed again extubation on April 16 and had to be reintubated April 19, she was diagnosed with left-sided pneumonia at that time and placed on antibiotics.  She was eventually extubated April/22nd, transferred to [facility] on April/23rd.  Subjective / 24h Interval events: Continues to complain of abdominal pain, tells me that she has difficulties eating and feels like every time she eats it is like poison.  Intermittent nausea but no vomiting  Assessment & Plan: Principal Problem Acute on chronic alcoholic pancreatitis, complicated with hemorrhagic shock and acute blood loss anemia due to upper GI bleed, POA-this is due to a splenic artery pseudoaneurysm, now status post embolization by IR on 4/20. -Her abdominal pain is persistent, however expect a chronic component at this point.  She has been cleared for dysphagia 1 diet however she has poor p.o. intake and  currently is on tube feeds as well. -Has persistent leukocytosis, afebrile in no apparent active infectious sources, closely monitor.  CT scan of the abdomen on 4/24 without acute findings -Continue pain control with scheduled Tylenol, as needed oxycodone and Dilaudid, palliative care following, appreciate input.  Attempt to wean off IV pain medications.   -Discussed with dietary, she is not yet ready to, for tube feeds.  Will touch base with TOC and determine whether she can go to SNF with a core track  Active Problems Acute hypoxic respiratory failure due to left lower lobe aspiration pneumonia-now off mechanical ventilation, respiratory cultures showed MSSA and Enterobacter, status post completed treatment with linezolid and cefepime. -Continue oxymetry monitoring and aspiration precautions, as needed bronchodilators and as tolerated airway clearing techniques with flutter valve and incentive spirometer.  -Respiratory status improved  AKI with hypernatremia/ hypokalemia/ hyperchloremia/ hyperchloremic non gap metabolic acidosis-patient has had significant diarrhea, C. difficile was negative.  Creatinine has remained stable.  Diarrhea improving  Acute metabolic encephalopathy -likely component of ICU delirium as well, continues to improve, alert and oriented x4 mentating well right now  Severe calorie protein malnutrition-Continue with Nutritional supplementation, thiamine and multivitamins.  Hypothyroid-Continuelevothyroxine  T2DM(Hgb A1c 5,4)-continue sliding scale   Scheduled Meds: . acetaminophen (TYLENOL) oral liquid 160 mg/5 mL  650 mg Per Tube Q6H  . Chlorhexidine Gluconate Cloth  6 each Topical Daily  . feeding supplement (ENSURE ENLIVE)  237 mL Oral TID BM  . feeding supplement (PRO-STAT SUGAR FREE 64)  30 mL Per Tube Daily  . feeding supplement (VITAL 1.5 CAL)  720 mL Per Tube Q24H  . free water  250 mL Per Tube Q4H  .  geriatric multivitamins-minerals  15 mL Per  Tube Daily  . HYDROmorphone HCl  24 mg Oral Q24H  . insulin aspart  0-6 Units Subcutaneous Q4H  . levothyroxine  50 mcg Per Tube Q0600  . lipase/protease/amylase  12,000 Units Oral TID AC  . magic mouthwash  5 mL Oral QID  . melatonin  3 mg Oral QHS  . pantoprazole  40 mg Oral BID  . potassium chloride  40 mEq Oral Once  . pregabalin  75 mg Oral BID  . sodium chloride flush  10-40 mL Intracatheter Q12H  . thiamine  100 mg Per Tube Daily  . traZODone  50 mg Oral QHS   Continuous Infusions:  PRN Meds:.albuterol, Gerhardt's butt cream, guaiFENesin-dextromethorphan, HYDROmorphone, ipratropium-albuterol, loperamide HCl, ondansetron (ZOFRAN) IV, Resource ThickenUp Clear, sodium chloride flush  DVT prophylaxis: SCDs Code Status: DNR Family Communication: no family at bedside, called daughter, no answer  Status is: Inpatient  Remains inpatient appropriate because:Core track dependent right now, poor p.o. intake and requiring tube feeds  Dispo: The patient is from: Home              Anticipated d/c is to: SNF              Anticipated d/c date is: > 3 days              Patient currently is not medically stable to d/c.   Consultants:  Palliative care Critical care  Procedures:  4/9 admitted; mass transfusion protocol 4/10 transfer to ICU; EGD, IR embolization 4/11 extubated 4/12 white count remains elevated at 39k, LFTs significantly elevated; continues to have dark red stools; hgb stable; minimal responsiveness; reintubated for increased work of breathing 4/13 LFTs improving. hgb stable. More responsive this morning. Minimal improvement in respiratory status. Increased BRBPR>>EGD 4/14 LFTs, renal function improving. More encephalopathic this morning. hgb stable. 4/15continues to be severely encephalopathic>head CT neg; progressive hypernatremia 148>151 4/16 improved mental status, improved renal function; extubated; off pressors 4/17 remains encephalopathic 4/18 D/c to  floor 4/19 Re-intubated and re-admitted to ICU 4/22 extubated 4/23 transferred to [facility]  Microbiology  Respiratory culture 4/20-MSSA, Enterobacter Cloacae  Antimicrobials: None     Objective: Vitals:   02/02/20 1946 02/02/20 2347 02/03/20 0351 02/03/20 0759  BP: (!) 126/56 135/70 (!) 125/55 (!) 109/56  Pulse: 77 81 91 86  Resp: (!) 22 (!) 24 (!) 25 19  Temp: 98 F (36.7 C) 98 F (36.7 C) 98 F (36.7 C) 98 F (36.7 C)  TempSrc: Oral Oral Oral Oral  SpO2: 91% 94% 95% 96%  Weight:   61 kg   Height:        Intake/Output Summary (Last 24 hours) at 02/03/2020 0941 Last data filed at 02/03/2020 0401 Gross per 24 hour  Intake 1564.5 ml  Output 2000 ml  Net -435.5 ml   Filed Weights   02/01/20 0340 02/02/20 0643 02/03/20 0351  Weight: 60 kg 59.2 kg 61 kg    Examination:  Constitutional: No distress Eyes: No scleral icterus ENMT: Moist mucous membranes Neck: normal, supple Respiratory: Diminished at the bases but overall clear without wheezing Cardiovascular: Regular rate and rhythm, no murmurs, no peripheral edema Abdomen: Remains quite tender to palpation, bowel sounds positive Musculoskeletal: no clubbing / cyanosis.  Skin: No rashes appreciated Neurologic: No focal deficits  Data Reviewed: I have independently reviewed following labs and imaging studies   CBC: Recent Labs  Lab 01/28/20 0420 01/28/20 0420 01/29/20 0500 01/30/20 0306 01/31/20 0513 02/01/20 0500 02/02/20  0627  WBC 43.1*   < > 44.1* 44.6* 44.7* 35.8* 32.0*  NEUTROABS 38.1*  --  36.5* 38.6* 37.2* 30.4*  --   HGB 9.6*   < > 9.9* 9.4* 8.8* 8.8* 8.4*  HCT 29.4*   < > 30.3* 29.2* 28.6* 27.4* 26.9*  MCV 95.5   < > 93.8 94.2 98.3 97.2 96.8  PLT 305   < > 309 261 263 377 401*   < > = values in this interval not displayed.   Basic Metabolic Panel: Recent Labs  Lab 01/29/20 0500 01/30/20 0306 01/31/20 0513 02/01/20 0500 02/02/20 0627  NA 149* 149* 142 141 137  K 2.7* 3.4* 4.5 3.4* 4.2  CL  120* 122* 119* 114* 112*  CO2 20* 20* 15* 19* 18*  GLUCOSE 128* 156* 93 142* 131*  BUN 51* 46* 42* 37* 32*  CREATININE 1.47* 1.28* 1.23* 1.17* 1.08*  CALCIUM 7.4* 7.2* 7.3* 7.8* 7.5*  MG 1.9 1.7 1.9  --  1.5*  PHOS  --   --   --   --  3.7   Liver Function Tests: Recent Labs  Lab 01/30/20 1350 02/02/20 0627  AST  --  24  ALT  --  21  ALKPHOS  --  82  BILITOT  --  0.4  PROT  --  4.1*  ALBUMIN 1.2* 1.1*   Coagulation Profile: No results for input(s): INR, PROTIME in the last 168 hours. HbA1C: No results for input(s): HGBA1C in the last 72 hours. CBG: Recent Labs  Lab 02/02/20 1540 02/02/20 1920 02/02/20 2346 02/03/20 0350 02/03/20 0845  GLUCAP 108* 239* 131* 147* 109*    Recent Results (from the past 240 hour(s))  C Difficile Quick Screen w PCR reflex     Status: None   Collection Time: 01/28/20  3:01 PM   Specimen: STOOL  Result Value Ref Range Status   C Diff antigen NEGATIVE NEGATIVE Final   C Diff toxin NEGATIVE NEGATIVE Final   C Diff interpretation No C. difficile detected.  Final    Comment: Performed at [facility], [place] [address] [place], [place] [address]     Radiology Studies: No results found.   Marzetta Board, MD, PhD [facility]  Between 7 am - 7 pm I am available, please contact me via Amion or Securechat  Between 7 pm - 7 am I am not available, please contact night coverage MD/APP via Amion

## 2020-02-03 NOTE — Progress Notes (Signed)
Physical Therapy Treatment Patient Details Name: Howard Patton MRN: [id number] DOB: 07-10-1951 Today's Date: 02/03/2020    History of Present Illness Pt is a 69 yo female admitted with hemorrhagic shock 2/2 bleeding 89mm pseudoaneurysm and recent instrumentation s/p EGD and coil embolization by IR 4/10. Course also significant for UTI, acute on chronic pancreatitis, and ETT 4/10-4/11; reintubated for encephalopathy and hypoxia 4/12-4/16. PMH also includes: COPD, hearing loss, HTN, thyroid disease.  Reintubated 4/19 and extubated again 4/23.    PT Comments    Pt awake, confused regarding flexiseal, purewick and function. Pt self limiting and denied even attempting to stand initially despite education for having call bell and time for assist back agreed upon by RN. Offered to simply stand and step toward [facility] or walk and return to bed and pt refused. Pt able to perform seated HEp at EOB and requires increased time with all activity and transitional movements with max encouragement throughout. Will continue to follow as pt participation allows.   Pt reported dizziness in sitting without nystagmus and BP 97/86 (90) 94% on 3L Hr 89    Follow Up Recommendations  SNF;Supervision/Assistance - 24 hour     Equipment Recommendations  Other (comment)(defer to next venue)    Recommendations for Other Services       Precautions / Restrictions Precautions Precautions: Fall Precaution Comments: cortrak, flexiseal    Mobility  Bed Mobility Overal bed mobility: Needs Assistance Bed Mobility: Supine to Sit;Sit to Supine     Supine to sit: Min assist Sit to supine: Mod assist   General bed mobility comments: increased time, cues and assist to transition to EOB. Return to bed with assist to bring legs to surface with mod cues  Transfers Overall transfer level: Needs assistance   Transfers: Sit to/from Stand;Squat Pivot Transfers Sit to Stand: Mod assist;+2 safety/equipment         General  transfer comment: mod +2 assist to rise from bed with pt only partially standing then returning to sitting. Pt then partial squat again with 2 person assist to shift sacrum toward HOB.  Ambulation/Gait             General Gait Details: pt denied attempting   Stairs             Wheelchair Mobility    Modified Rankin (Stroke Patients Only)       Balance Overall balance assessment: Needs assistance Sitting-balance support: Bilateral upper extremity supported;Feet supported Sitting balance-Leahy Scale: Fair Sitting balance - Comments: pt able to sit EOB grossly 20 min with pt with posterior bias with bil LE perturbation and guarding. Static sitting without LOB     Standing balance-Leahy Scale: Zero                              Cognition Arousal/Alertness: Awake/alert Behavior During Therapy: Restless Overall Cognitive Status: Impaired/Different from baseline Area of Impairment: Orientation;Attention;Memory;Following commands;Safety/judgement;Awareness;Problem solving                 Orientation Level: Disoriented to;Time Current Attention Level: Sustained Memory: Decreased short-term memory Following Commands: Follows one step commands inconsistently;Follows one step commands with increased time Safety/Judgement: Decreased awareness of safety;Decreased awareness of deficits Awareness: Intellectual Problem Solving: Slow processing;Requires verbal cues General Comments: pt oriented to place and states "sick" as situation. Pt self limiting refusing OOB despite education for benefit, options for timer for set limit in chair and encouragement  Exercises General Exercises - Lower Extremity Long Arc Quad: AROM;Both;10 reps;Seated Hip Flexion/Marching: Both;Seated;10 reps;AROM    General Comments        Pertinent Vitals/Pain Pain Score: 6  Pain Location: abdomen Pain Descriptors / Indicators: Grimacing Pain Intervention(s): Limited activity  within patient's tolerance;Monitored during session;Patient requesting pain meds-RN notified;Repositioned    Home Living                      Prior Function            PT Goals (current goals can now be found in the care plan section) Progress towards PT goals: Not progressing toward goals - comment(pt self limiting)    Frequency    Min 2X/week      PT Plan Current plan remains appropriate    Co-evaluation              AM-PAC PT "6 Clicks" Mobility   Outcome Measure  Help needed turning from your back to your side while in a flat bed without using bedrails?: A Little Help needed moving from lying on your back to sitting on the side of a flat bed without using bedrails?: A Lot Help needed moving to and from a bed to a chair (including a wheelchair)?: Total Help needed standing up from a chair using your arms (e.g., wheelchair or bedside chair)?: Total Help needed to walk in hospital room?: Total Help needed climbing 3-5 steps with a railing? : Total 6 Click Score: 9    End of Session Equipment Utilized During Treatment: Gait belt Activity Tolerance: Other (comment)(pt self limiting and denied further mobility) Patient left: in bed;with call bell/phone within reach;with bed alarm set Nurse Communication: Mobility status;Precautions PT Visit Diagnosis: Other abnormalities of gait and mobility (R26.89);Muscle weakness (generalized) (M62.81);Pain     Time: 3536-1443 PT Time Calculation (min) (ACUTE ONLY): 39 min  Charges:  $Therapeutic Exercise: 8-22 mins $Therapeutic Activity: 23-37 mins                     Sathvik Tiedt P, PT Acute Rehabilitation Services Pager: [phone] Office: [phone]    Ellias Mcelreath B Nickolaos Brallier 02/03/2020, 1:10 PM

## 2020-02-04 DIAGNOSIS — J431 Panlobular emphysema: Secondary | ICD-10-CM

## 2020-02-04 LAB — GLUCOSE, CAPILLARY
Glucose-Capillary: 142 mg/dL — ABNORMAL HIGH (ref 70–99)
Glucose-Capillary: 113 mg/dL — ABNORMAL HIGH (ref 70–99)
Glucose-Capillary: 111 mg/dL — ABNORMAL HIGH (ref 70–99)
Glucose-Capillary: 110 mg/dL — ABNORMAL HIGH (ref 70–99)
Glucose-Capillary: 143 mg/dL — ABNORMAL HIGH (ref 70–99)

## 2020-02-04 NOTE — Plan of Care (Signed)
  Problem: Education: Goal: Knowledge of General Education information will improve Description: Including pain rating scale, medication(s)/side effects and non-pharmacologic comfort measures Outcome: Progressing   Problem: Health Behavior/Discharge Planning: Goal: Ability to manage health-related needs will improve Outcome: Progressing   Problem: Clinical Measurements: Goal: Ability to maintain clinical measurements within normal limits will improve Outcome: Progressing Goal: Will remain free from infection Outcome: Progressing Goal: Diagnostic test results will improve Outcome: Progressing Goal: Respiratory complications will improve Outcome: Progressing Goal: Cardiovascular complication will be avoided Outcome: Progressing   Problem: Activity: Goal: Risk for activity intolerance will decrease Outcome: Progressing   Problem: Nutrition: Goal: Adequate nutrition will be maintained Outcome: Progressing   Problem: Coping: Goal: Level of anxiety will decrease Outcome: Progressing   Problem: Elimination: Goal: Will not experience complications related to bowel motility Outcome: Progressing Goal: Will not experience complications related to urinary retention Outcome: Progressing   Problem: Pain Managment: Goal: General experience of comfort will improve Outcome: Progressing   Problem: Safety: Goal: Ability to remain free from injury will improve Outcome: Progressing   Problem: Skin Integrity: Goal: Risk for impaired skin integrity will decrease Outcome: Progressing   Problem: Activity: Goal: Ability to tolerate increased activity will improve Outcome: Progressing   Problem: Respiratory: Goal: Ability to maintain a clear airway and adequate ventilation will improve Outcome: Progressing   Problem: Role Relationship: Goal: Method of communication will improve Outcome: Progressing   Problem: Education: Goal: Ability to identify signs and symptoms of  gastrointestinal bleeding will improve Outcome: Progressing   Problem: Bowel/Gastric: Goal: Will show no signs and symptoms of gastrointestinal bleeding Outcome: Progressing   Problem: Fluid Volume: Goal: Will show no signs and symptoms of excessive bleeding Outcome: Progressing   Problem: Clinical Measurements: Goal: Complications related to the disease process, condition or treatment will be avoided or minimized Outcome: Progressing   

## 2020-02-04 NOTE — Progress Notes (Signed)
PROGRESS NOTE  Natasha Jacobson SAY:[id number] DOB: 01-31-1951 DOA: 01/13/2020 PCP: Ardyth Gal, MD   LOS: 22 days   Brief Narrative / Interim history: 69 year old female with history of chronic alcoholic pancreatitis, status post a recent pancreatico gastrostomy stent at [facility] on 01/12/20, admitted here with acute GI bleed due to splenic artery pseudoaneurysm status post embolization.  She presented to [place] with recurrent abdominal pain and hematemesis, and hospital course was complicated by hypotension and hemorrhagic shock requiring multiple PRBC transfusion, invasive mechanical ventilation as well as vasopressors.  GI was consulted and underwent an EGD which showed active upper GI bleeding, and a follow-up CT angiogram showed a 7 mm pseudoaneurysm over the pancreatic branch of the splenic artery.  IR was consulted due to splenic angiogram and coil embolization of the pseudoaneurysm.  She failed extubation on April 11 and had to be reintubated on April 12 due to increased work of breathing and respiratory distress, eventually being extubated.  Failed again extubation on April 16 and had to be reintubated April 19, she was diagnosed with left-sided pneumonia at that time and placed on antibiotics.  She was eventually extubated April/22nd, transferred to [facility] on April/23rd.  Subjective / 24h Interval events: Same abdominal pain present.  She feels like she is eating more  Assessment & Plan: Principal Problem Acute on chronic alcoholic pancreatitis, complicated with hemorrhagic shock and acute blood loss anemia due to upper GI bleed, POA-this is due to a splenic artery pseudoaneurysm, now status post embolization by IR on 4/20. -Her abdominal pain is persistent, however expect a chronic component at this point.  She has been cleared for dysphagia 1 diet however she has poor p.o. intake and currently is on tube feeds as well. -Has persistent leukocytosis, afebrile in no apparent active infectious  sources, closely monitor.  CT scan of the abdomen on 4/24 without acute findings -Continue pain control with scheduled Tylenol, as needed oxycodone and Dilaudid, palliative care following, appreciate input.  Attempt to wean off IV pain medications. -Dietary following, hopefully we can discontinue the NG tube within the next couple of days so that she can go to SNF  Active Problems Acute hypoxic respiratory failure due to left lower lobe aspiration pneumonia-now off mechanical ventilation, respiratory cultures showed MSSA and Enterobacter, status post completed treatment with linezolid and cefepime. -Continue oxymetry monitoring and aspiration precautions, as needed bronchodilators and as tolerated airway clearing techniques with flutter valve and incentive spirometer.  -Respiratory status improved  AKI with hypernatremia/ hypokalemia/ hyperchloremia/ hyperchloremic non gap metabolic acidosis-patient has had significant diarrhea, C. difficile was negative.  Creatinine has remained stable.  Diarrhea improving.  Recheck labs tomorrow  Acute metabolic encephalopathy -likely component of ICU delirium as well, continues to improve, alert and oriented x4 mentating well right now  Severe calorie protein malnutrition-Continue with Nutritional supplementation, thiamine and multivitamins.  Hypothyroid-Continuelevothyroxine  T2DM(Hgb A1c 5,4)-continue sliding scale   Scheduled Meds: . acetaminophen (TYLENOL) oral liquid 160 mg/5 mL  650 mg Per Tube Q6H  . Chlorhexidine Gluconate Cloth  6 each Topical Daily  . feeding supplement (ENSURE ENLIVE)  237 mL Oral TID BM  . feeding supplement (PRO-STAT SUGAR FREE 64)  30 mL Per Tube Daily  . feeding supplement (VITAL 1.5 CAL)  720 mL Per Tube Q24H  . free water  250 mL Per Tube Q4H  . geriatric multivitamins-minerals  15 mL Per Tube Daily  . HYDROmorphone HCl  24 mg Oral Q24H  . insulin aspart  0-6 Units  Subcutaneous Q4H  . levothyroxine  50 mcg  Per Tube Q0600  . lipase/protease/amylase  12,000 Units Oral TID AC  . magic mouthwash  5 mL Oral QID  . melatonin  3 mg Oral QHS  . pantoprazole  40 mg Oral BID  . potassium chloride  40 mEq Oral Once  . pregabalin  75 mg Oral BID  . sodium chloride flush  10-40 mL Intracatheter Q12H  . thiamine  100 mg Per Tube Daily  . traZODone  50 mg Oral QHS   Continuous Infusions:  PRN Meds:.albuterol, Gerhardt's butt cream, guaiFENesin-dextromethorphan, HYDROmorphone, ipratropium-albuterol, loperamide HCl, ondansetron (ZOFRAN) IV, Resource ThickenUp Clear, sodium chloride flush  DVT prophylaxis: SCDs Code Status: DNR Family Communication: no family at bedside, called daughter, no answer  Status is: Inpatient  Remains inpatient appropriate because:Core track dependent right now, poor p.o. intake and requiring tube feeds  Dispo: The patient is from: Home              Anticipated d/c is to: SNF              Anticipated d/c date is: > 3 days              Patient currently is not medically stable to d/c.   Consultants:  Palliative care Critical care  Procedures:  4/9 admitted; mass transfusion protocol 4/10 transfer to ICU; EGD, IR embolization 4/11 extubated 4/12 white count remains elevated at 39k, LFTs significantly elevated; continues to have dark red stools; hgb stable; minimal responsiveness; reintubated for increased work of breathing 4/13 LFTs improving. hgb stable. More responsive this morning. Minimal improvement in respiratory status. Increased BRBPR>>EGD 4/14 LFTs, renal function improving. More encephalopathic this morning. hgb stable. 4/15continues to be severely encephalopathic>head CT neg; progressive hypernatremia 148>151 4/16 improved mental status, improved renal function; extubated; off pressors 4/17 remains encephalopathic 4/18 D/c to floor 4/19 Re-intubated and re-admitted to ICU 4/22 extubated 4/23 transferred to [facility]  Microbiology  Respiratory culture  4/20-MSSA, Enterobacter Cloacae  Antimicrobials: None     Objective: Vitals:   02/04/20 0400 02/04/20 0512 02/04/20 0837 02/04/20 1057  BP:  124/61 121/66 (!) 107/57  Pulse: 85 87 86 87  Resp: 20 (!) 24 (!) 23 18  Temp:  98.7 F (37.1 C) 98.5 F (36.9 C) 98.3 F (36.8 C)  TempSrc:  Oral Oral Oral  SpO2: 96% 95% 96% 100%  Weight:  60.7 kg    Height:        Intake/Output Summary (Last 24 hours) at 02/04/2020 1216 Last data filed at 02/04/2020 0648 Gross per 24 hour  Intake 4080 ml  Output 300 ml  Net 3780 ml   Filed Weights   02/02/20 0643 02/03/20 0351 02/04/20 0512  Weight: 59.2 kg 61 kg 60.7 kg    Examination:  Constitutional: NAD Respiratory: Clear bilaterally diminished at the bases but overall clear without wheezing Cardiovascular: Regular rate and rhythm   Data Reviewed: I have independently reviewed following labs and imaging studies   CBC: Recent Labs  Lab 01/29/20 0500 01/30/20 0306 01/31/20 0513 02/01/20 0500 02/02/20 0627  WBC 44.1* 44.6* 44.7* 35.8* 32.0*  NEUTROABS 36.5* 38.6* 37.2* 30.4*  --   HGB 9.9* 9.4* 8.8* 8.8* 8.4*  HCT 30.3* 29.2* 28.6* 27.4* 26.9*  MCV 93.8 94.2 98.3 97.2 96.8  PLT 309 261 263 377 147*   Basic Metabolic Panel: Recent Labs  Lab 01/29/20 0500 01/30/20 0306 01/31/20 0513 02/01/20 0500 02/02/20 0627  NA 149* 149* 142 141 137  K 2.7* 3.4* 4.5 3.4* 4.2  CL 120* 122* 119* 114* 112*  CO2 20* 20* 15* 19* 18*  GLUCOSE 128* 156* 93 142* 131*  BUN 51* 46* 42* 37* 32*  CREATININE 1.47* 1.28* 1.23* 1.17* 1.08*  CALCIUM 7.4* 7.2* 7.3* 7.8* 7.5*  MG 1.9 1.7 1.9  --  1.5*  PHOS  --   --   --   --  3.7   Liver Function Tests: Recent Labs  Lab 01/30/20 1350 02/02/20 0627  AST  --  24  ALT  --  21  ALKPHOS  --  82  BILITOT  --  0.4  PROT  --  4.1*  ALBUMIN 1.2* 1.1*   Coagulation Profile: No results for input(s): INR, PROTIME in the last 168 hours. HbA1C: No results for input(s): HGBA1C in the last 72  hours. CBG: Recent Labs  Lab 02/03/20 1916 02/03/20 2337 02/04/20 0511 02/04/20 0839 02/04/20 1056  GLUCAP 121* 140* 142* 113* 111*    Recent Results (from the past 240 hour(s))  C Difficile Quick Screen w PCR reflex     Status: None   Collection Time: 01/28/20  3:01 PM   Specimen: STOOL  Result Value Ref Range Status   C Diff antigen NEGATIVE NEGATIVE Final   C Diff toxin NEGATIVE NEGATIVE Final   C Diff interpretation No C. difficile detected.  Final    Comment: Performed at [facility], [address] [place], [place] [address]     Radiology Studies: No results found.   Pamella Pert, MD, PhD [facility]  Between 7 am - 7 pm I am available, please contact me via Amion or Securechat  Between 7 pm - 7 am I am not available, please contact night coverage MD/APP via Amion

## 2020-02-04 NOTE — Progress Notes (Signed)
Daily Progress Note   Patient Name: Natasha Jacobson       Date: 02/04/2020 DOB: 1951-07-18  Age: 69 y.o. MRN#: [id number] Attending Physician: Leatha Gilding, MD Primary Care Physician: Ardyth Gal, MD Admit Date: 01/13/2020  Reason for Consultation/Follow-up: To discuss complex medical decision making related to patient's goals of care  Subjective: Patient wakes from sleep to speak with me.  She is having pain in the back of her throat and pain in her left chest.  Overall however she is much improved.  Flexiseal still in place - she is still having significant amounts of liquid stool.  We talked about the pancreas and the cumulative effect of all that her body has been thru.  We talked about her daughter Jamesetta Orleans - She made all the right decisions but had a difficult time doing it.  It would really be helpful to [place] if Nancylee could provide her with her input.  If Laquandra needed to be intubated again (3x this admission) - would she want to do it?  Would Temima want a tracheostomy?  At what point would Natalin want her daughter to make the decision to let her go?  Would she want to live bedbound in a nursing facility?   I encouraged Karyna to have these conversations with Jamesetta Orleans to save her the stress and doubt of possibly making the wrong decision.  Assessment: Very fragile 69 yo woman.  Finally stabilized after developing hemorraghic shock post pancreatic stent exchange at [facility].  Mental status is finally back to baseline.  Pain appears controlled compared to previous.  She is still producing large amounts of liquid stools.  She is only able to tolerate a liquid diet.  I am not sure she would be able to progress her diet.  This may be approaching her new baseline   Patient Profile/HPI:  69 y.o.femalewith  past medical history of COPD,chronic Hep C,chronic pancreatitis, anxiety, chronic pain,pancreatic gastrostomy stent exchanged on 4/8admitted on 4/9/2021with hematemesis, red bloody bowel movements, abdominal pain, hypotension and low Hgb. Clinically worsened on 4/10 requiring intubation. 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 Urine + e. Faecalis. Evidence of possible renal infarct on CT scan. She was extubated 4/11, reintubated  4/12, extubated 4/16, and reintubated 4/19. Now with pneumonia and septic shock requiring vasopressors. Hypoalbumin at 1.3. Transaminases at creatinine are both trending up. She has remained encephalopathic during entire admission. Palliative medicine consulted for goals of care.Mental status is greatly improved- she is working towards going to SNF.      Length of Stay: 22   Vital Signs: BP (!) 107/57 (BP Location: Left Arm)   Pulse 87   Temp 98.3 F (36.8 C) (Oral)   Resp 18   Ht 5\' 4"  (1.626 m)   Wt 60.7 kg   SpO2 100%   BMI 22.97 kg/m  SpO2: SpO2: 100 % O2 Device: O2 Device: Nasal Cannula O2 Flow Rate: O2 Flow Rate (L/min): 3 L/min       Palliative Assessment/Data: 30%     Palliative Care Plan    Recommendations/Plan: Continue current care. Encouraged patient to have more in-depth conversation with [name] who is her medical decision making surrogate regarding tracheostomy and living will. Pain appears controlled on current regimen and she is clear minded.  Will likely need a pain management doctor outpatient. Palliative to follow at facility.  Code Status:  DNR  Prognosis: It would not be surprising if Ms. Pehrson was not here in 12 months.  Discharge Planning: [facility] for rehab with Palliative care service follow-up  Care plan was discussed with patient.  Thank  you for allowing the Palliative Medicine Team to assist in the care of this patient.  Total time spent:  35 min.     Greater than 50%  of this time was spent counseling and coordinating care related to the above assessment and plan.  Florentina Jenny, PA-C Palliative Medicine  Please contact Palliative MedicineTeam phone at [phone] for questions and concerns between 7 am - 7 pm.   Please see AMION for individual provider pager numbers.

## 2020-02-05 LAB — COMPREHENSIVE METABOLIC PANEL
ALT: 20 U/L (ref 0–44)
AST: 19 U/L (ref 15–41)
Albumin: <1 g/dL — ABNORMAL LOW (ref 3.5–5.0)
Alkaline Phosphatase: 90 U/L (ref 38–126)
Anion gap: 6 (ref 5–15)
BUN: 37 mg/dL — ABNORMAL HIGH (ref 8–23)
CO2: 23 mmol/L (ref 22–32)
Calcium: 7.7 mg/dL — ABNORMAL LOW (ref 8.9–10.3)
Chloride: 108 mmol/L (ref 98–111)
Creatinine, Ser: 1.06 mg/dL — ABNORMAL HIGH (ref 0.44–1.00)
GFR calc Af Amer: >60 mL/min (ref >60)
GFR calc non Af Amer: 54 mL/min — ABNORMAL LOW (ref >60)
Glucose, Bld: 142 mg/dL — ABNORMAL HIGH (ref 70–99)
Potassium: 4.1 mmol/L (ref 3.5–5.1)
Sodium: 137 mmol/L (ref 135–145)
Total Bilirubin: 0.3 mg/dL (ref 0.3–1.2)
Total Protein: 4.2 g/dL — ABNORMAL LOW (ref 6.5–8.1)

## 2020-02-05 LAB — GLUCOSE, CAPILLARY
Glucose-Capillary: 102 mg/dL — ABNORMAL HIGH (ref 70–99)
Glucose-Capillary: 118 mg/dL — ABNORMAL HIGH (ref 70–99)
Glucose-Capillary: 124 mg/dL — ABNORMAL HIGH (ref 70–99)
Glucose-Capillary: 130 mg/dL — ABNORMAL HIGH (ref 70–99)
Glucose-Capillary: 159 mg/dL — ABNORMAL HIGH (ref 70–99)
Glucose-Capillary: 175 mg/dL — ABNORMAL HIGH (ref 70–99)

## 2020-02-05 LAB — CBC
HCT: 25.3 % — ABNORMAL LOW (ref 36.0–46.0)
Hemoglobin: 7.9 g/dL — ABNORMAL LOW (ref 12.0–15.0)
MCH: 30.4 pg (ref 26.0–34.0)
MCHC: 31.2 g/dL (ref 30.0–36.0)
MCV: 97.3 fL (ref 80.0–100.0)
Platelets: 622 10*3/uL — ABNORMAL HIGH (ref 150–400)
RBC: 2.6 MIL/uL — ABNORMAL LOW (ref 3.87–5.11)
RDW: 17.2 % — ABNORMAL HIGH (ref 11.5–15.5)
WBC: 35.3 10*3/uL — ABNORMAL HIGH (ref 4.0–10.5)
nRBC: 0.1 % (ref 0.0–0.2)

## 2020-02-05 LAB — PHOSPHORUS: Phosphorus: 3.6 mg/dL (ref 2.5–4.6)

## 2020-02-05 LAB — MAGNESIUM: Magnesium: 1.4 mg/dL — ABNORMAL LOW (ref 1.7–2.4)

## 2020-02-05 MED ORDER — MAGNESIUM SULFATE 2 GM/50ML IV SOLN
2.0000 g | Freq: Once | INTRAVENOUS | Status: AC
  Administered 2020-02-05: 11:00:00 2 g via INTRAVENOUS
  Filled 2020-02-05: qty 50

## 2020-02-05 MED ORDER — ALBUMIN HUMAN 25 % IV SOLN
25.0000 g | Freq: Once | INTRAVENOUS | Status: AC
  Administered 2020-02-05: 13:00:00 25 g via INTRAVENOUS
  Filled 2020-02-05: qty 100

## 2020-02-05 NOTE — Progress Notes (Signed)
PROGRESS NOTE  Natasha Jacobson HQP:[id number] DOB: Nov 20, 1950 DOA: 01/13/2020 PCP: Concepcion Elk, MD   LOS: 23 days   Brief Narrative / Interim history: 69 year old female with history of chronic alcoholic pancreatitis, status post a recent pancreatico gastrostomy stent at [facility] on 01/12/20, admitted here with acute GI bleed due to splenic artery pseudoaneurysm status post embolization.  She presented to [place] with recurrent abdominal pain and hematemesis, and hospital course was complicated by hypotension and hemorrhagic shock requiring multiple PRBC transfusion, invasive mechanical ventilation as well as vasopressors.  GI was consulted and underwent an EGD which showed active upper GI bleeding, and a follow-up CT angiogram showed a 7 mm pseudoaneurysm over the pancreatic branch of the splenic artery.  IR was consulted due to splenic angiogram and coil embolization of the pseudoaneurysm.  She failed extubation on April 11 and had to be reintubated on April 12 due to increased work of breathing and respiratory distress, eventually being extubated.  Failed again extubation on April 16 and had to be reintubated April 19, she was diagnosed with left-sided pneumonia at that time and placed on antibiotics.  She was eventually extubated April/22nd, transferred to [facility] on April/23rd.  Subjective / 24h Interval events: Feels the same, complains of abdominal pain  Assessment & Plan: Principal Problem Acute on chronic alcoholic pancreatitis, complicated with hemorrhagic shock and acute blood loss anemia due to upper GI bleed, POA-this is due to a splenic artery pseudoaneurysm, now status post embolization by IR on 4/20. -Her abdominal pain is persistent, however expect a chronic component at this point.  She has been cleared for dysphagia 1 diet however she has poor p.o. intake and currently is on tube feeds as well. -Has persistent leukocytosis, afebrile in no apparent active infectious sources, closely  monitor.  CT scan of the abdomen on 4/24 without acute findings.  C. difficile was negative -Continue pain control with scheduled Tylenol, as needed oxycodone and Dilaudid, palliative care following, appreciate input.  Attempt to wean off IV pain medications. -Dietary following, hopefully we can discontinue the NG tube within the next couple of days so that she can go to SNF.  Will touch base with dietary on Monday  Active Problems Acute hypoxic respiratory failure due to left lower lobe aspiration pneumonia-now off mechanical ventilation, respiratory cultures showed MSSA and Enterobacter, status post completed treatment with linezolid and cefepime. -Continue oxymetry monitoring and aspiration precautions, as needed bronchodilators and as tolerated airway clearing techniques with flutter valve and incentive spirometer.  -Respiratory status improved  AKI with hypernatremia/ hypokalemia/ hyperchloremia/ hyperchloremic non gap metabolic acidosis-patient has had significant diarrhea, C. difficile was negative.  Creatinine has remained stable.  Diarrhea improving.  Replete magnesium again today  Acute metabolic encephalopathy -likely component of ICU delirium as well, continues to improve, alert and oriented x4 mentating well right now  Severe calorie protein malnutrition-Continue with Nutritional supplementation, thiamine and multivitamins. Albumin today less than 1.  Has a little bit of swelling, will give albumin IV  Hypothyroid-Continuelevothyroxine  T2DM(Hgb A1c 5,4)-continue sliding scale.  CBGs stable  CBG (last 3)  Recent Labs    02/04/20 1932 02/04/20 2354 02/05/20 0401  GLUCAP 143* 175* 130*     Scheduled Meds: . acetaminophen (TYLENOL) oral liquid 160 mg/5 mL  650 mg Per Tube Q6H  . Chlorhexidine Gluconate Cloth  6 each Topical Daily  . feeding supplement (ENSURE ENLIVE)  237 mL Oral TID BM  . feeding supplement (PRO-STAT SUGAR FREE 64)  30 mL Per  Tube Daily  .  feeding supplement (VITAL 1.5 CAL)  720 mL Per Tube Q24H  . free water  250 mL Per Tube Q4H  . geriatric multivitamins-minerals  15 mL Per Tube Daily  . HYDROmorphone HCl  24 mg Oral Q24H  . insulin aspart  0-6 Units Subcutaneous Q4H  . levothyroxine  50 mcg Per Tube Q0600  . lipase/protease/amylase  12,000 Units Oral TID AC  . magic mouthwash  5 mL Oral QID  . melatonin  3 mg Oral QHS  . pantoprazole  40 mg Oral BID  . potassium chloride  40 mEq Oral Once  . pregabalin  75 mg Oral BID  . sodium chloride flush  10-40 mL Intracatheter Q12H  . thiamine  100 mg Per Tube Daily  . traZODone  50 mg Oral QHS   Continuous Infusions: . albumin human    . magnesium sulfate bolus IVPB     PRN Meds:.albuterol, Gerhardt's butt cream, guaiFENesin-dextromethorphan, HYDROmorphone, ipratropium-albuterol, loperamide HCl, ondansetron (ZOFRAN) IV, Resource ThickenUp Clear, sodium chloride flush  DVT prophylaxis: SCDs Code Status: DNR Family Communication: no family at bedside, called daughter, no answer  Status is: Inpatient  Remains inpatient appropriate because:Core track dependent right now, poor p.o. intake and requiring tube feeds  Dispo: The patient is from: Home              Anticipated d/c is to: SNF              Anticipated d/c date is: > 3 days              Patient currently is not medically stable to d/c.   Consultants:  Palliative care Critical care  Procedures:  4/9 admitted; mass transfusion protocol 4/10 transfer to ICU; EGD, IR embolization 4/11 extubated 4/12 white count remains elevated at 39k, LFTs significantly elevated; continues to have dark red stools; hgb stable; minimal responsiveness; reintubated for increased work of breathing 4/13 LFTs improving. hgb stable. More responsive this morning. Minimal improvement in respiratory status. Increased BRBPR>>EGD 4/14 LFTs, renal function improving. More encephalopathic this morning. hgb stable. 4/15continues to be severely  encephalopathic>head CT neg; progressive hypernatremia 148>151 4/16 improved mental status, improved renal function; extubated; off pressors 4/17 remains encephalopathic 4/18 D/c to floor 4/19 Re-intubated and re-admitted to ICU 4/22 extubated 4/23 transferred to [facility]  Microbiology  Respiratory culture 4/20-MSSA, Enterobacter Cloacae  Antimicrobials: None     Objective: Vitals:   02/04/20 2000 02/05/20 0016 02/05/20 0359 02/05/20 0400  BP:  130/66  124/69  Pulse: 85   83  Resp: 18   (!) 21  Temp:  98.5 F (36.9 C) 98.5 F (36.9 C) 98.5 F (36.9 C)  TempSrc:  Oral Oral Oral  SpO2: 99%   94%  Weight:   63.4 kg   Height:        Intake/Output Summary (Last 24 hours) at 02/05/2020 0831 Last data filed at 02/05/2020 0600 Gross per 24 hour  Intake 1560 ml  Output 600 ml  Net 960 ml   Filed Weights   02/03/20 0351 02/04/20 0512 02/05/20 0359  Weight: 61 kg 60.7 kg 63.4 kg    Examination:  Constitutional: NAD Vitals:   02/04/20 2000 02/05/20 0016 02/05/20 0359 02/05/20 0400  BP:  130/66  124/69  Pulse: 85   83  Resp: 18   (!) 21  Temp:  98.5 F (36.9 C) 98.5 F (36.9 C) 98.5 F (36.9 C)  TempSrc:  Oral Oral Oral  SpO2: 99%  94%  Weight:   63.4 kg   Height:       Eyes: No scleral icterus ENMT: Mucous membranes are moist.  Respiratory: Clear, no wheezing, normal respiratory effort Cardiovascular: Regular rate and rhythm, no murmurs / rubs / gallops.  Trace edema Abdomen: Moderately tender to palpation, no guarding or rebound, bowel sounds positive Musculoskeletal: no clubbing / cyanosis. Normal muscle tone.  Skin: No rashes seen Neurologic: Nonfocal  Data Reviewed: I have independently reviewed following labs and imaging studies   CBC: Recent Labs  Lab 01/30/20 0306 01/31/20 0513 02/01/20 0500 02/02/20 0627 02/05/20 0500  WBC 44.6* 44.7* 35.8* 32.0* 35.3*  NEUTROABS 38.6* 37.2* 30.4*  --   --   HGB 9.4* 8.8* 8.8* 8.4* 7.9*  HCT 29.2* 28.6* 27.4*  26.9* 25.3*  MCV 94.2 98.3 97.2 96.8 97.3  PLT 261 263 377 401* 622*   Basic Metabolic Panel: Recent Labs  Lab 01/30/20 0306 01/31/20 0513 02/01/20 0500 02/02/20 0627 02/05/20 0500  NA 149* 142 141 137 137  K 3.4* 4.5 3.4* 4.2 4.1  CL 122* 119* 114* 112* 108  CO2 20* 15* 19* 18* 23  GLUCOSE 156* 93 142* 131* 142*  BUN 46* 42* 37* 32* 37*  CREATININE 1.28* 1.23* 1.17* 1.08* 1.06*  CALCIUM 7.2* 7.3* 7.8* 7.5* 7.7*  MG 1.7 1.9  --  1.5* 1.4*  PHOS  --   --   --  3.7 3.6   Liver Function Tests: Recent Labs  Lab 01/30/20 1350 02/02/20 0627 02/05/20 0500  AST  --  24 19  ALT  --  21 20  ALKPHOS  --  82 90  BILITOT  --  0.4 0.3  PROT  --  4.1* 4.2*  ALBUMIN 1.2* 1.1* <1.0*   Coagulation Profile: No results for input(s): INR, PROTIME in the last 168 hours. HbA1C: No results for input(s): HGBA1C in the last 72 hours. CBG: Recent Labs  Lab 02/04/20 1056 02/04/20 1558 02/04/20 1932 02/04/20 2354 02/05/20 0401  GLUCAP 111* 110* 143* 175* 130*    Recent Results (from the past 240 hour(s))  C Difficile Quick Screen w PCR reflex     Status: None   Collection Time: 01/28/20  3:01 PM   Specimen: STOOL  Result Value Ref Range Status   C Diff antigen NEGATIVE NEGATIVE Final   C Diff toxin NEGATIVE NEGATIVE Final   C Diff interpretation No C. difficile detected.  Final    Comment: Performed at [facility], [address] [place], [place] [address]     Radiology Studies: No results found.   Pamella Pert, MD, PhD [facility]  Between 7 am - 7 pm I am available, please contact me via Amion or Securechat  Between 7 pm - 7 am I am not available, please contact night coverage MD/APP via Amion

## 2020-02-06 ENCOUNTER — Inpatient Hospital Stay (HOSPITAL_COMMUNITY): Payer: Medicare Other

## 2020-02-06 LAB — PROTIME-INR
INR: 1.2 (ref 0.8–1.2)
Prothrombin Time: 14.9 seconds (ref 11.4–15.2)

## 2020-02-06 LAB — GLUCOSE, CAPILLARY
Glucose-Capillary: 105 mg/dL — ABNORMAL HIGH (ref 70–99)
Glucose-Capillary: 121 mg/dL — ABNORMAL HIGH (ref 70–99)
Glucose-Capillary: 135 mg/dL — ABNORMAL HIGH (ref 70–99)
Glucose-Capillary: 136 mg/dL — ABNORMAL HIGH (ref 70–99)
Glucose-Capillary: 147 mg/dL — ABNORMAL HIGH (ref 70–99)
Glucose-Capillary: 154 mg/dL — ABNORMAL HIGH (ref 70–99)
Glucose-Capillary: 99 mg/dL (ref 70–99)

## 2020-02-06 MED ORDER — VITAL 1.5 CAL PO LIQD
840.0000 mL | ORAL | Status: DC
  Administered 2020-02-07: 840 mL
  Filled 2020-02-06 (×3): qty 948

## 2020-02-06 MED ORDER — IOHEXOL 9 MG/ML PO SOLN
500.0000 mL | ORAL | Status: AC
  Administered 2020-02-06 (×2): 500 mL via ORAL

## 2020-02-06 MED ORDER — FREE WATER
150.0000 mL | Status: DC
  Administered 2020-02-06 – 2020-02-09 (×16): 150 mL

## 2020-02-06 MED ORDER — IOHEXOL 300 MG/ML  SOLN
100.0000 mL | Freq: Once | INTRAMUSCULAR | Status: AC | PRN
  Administered 2020-02-06: 100 mL via INTRAVENOUS

## 2020-02-06 NOTE — Progress Notes (Signed)
PROGRESS NOTE  Natasha Jacobson MWU:[id number] DOB: 02-18-1951 DOA: 01/13/2020 PCP: Concepcion Elk, MD   LOS: 24 days   Brief Narrative / Interim history: 69 year old female with history of chronic alcoholic pancreatitis, status post a recent pancreatico gastrostomy stent at [facility] on 01/12/20, admitted here with acute GI bleed due to splenic artery pseudoaneurysm status post embolization.  She presented to [place] with recurrent abdominal pain and hematemesis, and hospital course was complicated by hypotension and hemorrhagic shock requiring multiple PRBC transfusion, invasive mechanical ventilation as well as vasopressors.  GI was consulted and underwent an EGD which showed active upper GI bleeding, and a follow-up CT angiogram showed a 7 mm pseudoaneurysm over the pancreatic branch of the splenic artery.  IR was consulted due to splenic angiogram and coil embolization of the pseudoaneurysm.  She failed extubation on April 11 and had to be reintubated on April 12 due to increased work of breathing and respiratory distress, eventually being extubated.  Failed again extubation on April 16 and had to be reintubated April 19, she was diagnosed with left-sided pneumonia at that time and placed on antibiotics.  She was eventually extubated April/22nd, transferred to [facility] on April/23rd.  Subjective / 24h Interval events: Complains of pain in her neck, dry mouth. Persistent abdominal pain  Assessment & Plan: Principal Problem Acute on chronic alcoholic pancreatitis, complicated with hemorrhagic shock and acute blood loss anemia due to upper GI bleed, POA-this is due to a splenic artery pseudoaneurysm, now status post embolization by IR on 4/20. -Her abdominal pain is persistent, however expect a chronic component at this point.  She has been cleared for dysphagia 1 diet however she has poor p.o. intake and currently is on tube feeds as well. -Has persistent leukocytosis, afebrile in no apparent active infectious  sources, closely monitor.  CT scan of the abdomen on 4/24 without acute findings.  C. difficile was negative -Continue pain control with scheduled Tylenol, as needed oxycodone and Dilaudid, palliative care following, appreciate input.  Attempt to wean off IV pain medications. -Discussed with dietitian today, she did not recommend discontinuation of the NG tube due to very poor p.o. intake, I wonder whether she would need PEG tube at this point.  Will discuss with patient, however unfortunately it is on national back order -Given persistent abdominal pain, persistent leukocytosis and improvement in her kidney function obtain a CT scan with contrast today  Active Problems Acute hypoxic respiratory failure due to left lower lobe aspiration pneumonia-now off mechanical ventilation, respiratory cultures showed MSSA and Enterobacter, status post completed treatment with linezolid and cefepime. -Continue oxymetry monitoring and aspiration precautions, as needed bronchodilators and as tolerated airway clearing techniques with flutter valve and incentive spirometer.  -Respiratory status is stable  AKI with hypernatremia/ hypokalemia/ hyperchloremia/ hyperchloremic non gap metabolic acidosis-patient has had significant diarrhea, C. difficile was negative.  Creatinine has remained stable.  Monitor labs intermittently  Acute metabolic encephalopathy -likely component of ICU delirium as well, continues to improve, alert and oriented x4 mentating well right now  Severe calorie protein malnutrition-Continue with Nutritional supplementation, thiamine and multivitamins.  Received albumin 5/2.  She is severely malnourished continue tube feeds as she has little to no p.o. intake  Hypothyroid-Continuelevothyroxine  T2DM(Hgb A1c 5,4)-continue sliding scale.  CBGs stable  CBG (last 3)  Recent Labs    02/05/20 2020 02/06/20 0351 02/06/20 0816  GLUCAP 124* 136* 147*     Scheduled Meds: .  acetaminophen (TYLENOL) oral liquid 160 mg/5 mL  650  mg Per Tube Q6H  . Chlorhexidine Gluconate Cloth  6 each Topical Daily  . feeding supplement (ENSURE ENLIVE)  237 mL Oral TID BM  . feeding supplement (PRO-STAT SUGAR FREE 64)  30 mL Per Tube Daily  . feeding supplement (VITAL 1.5 CAL)  720 mL Per Tube Q24H  . free water  250 mL Per Tube Q4H  . geriatric multivitamins-minerals  15 mL Per Tube Daily  . HYDROmorphone HCl  24 mg Oral Q24H  . insulin aspart  0-6 Units Subcutaneous Q4H  . levothyroxine  50 mcg Per Tube Q0600  . lipase/protease/amylase  12,000 Units Oral TID AC  . magic mouthwash  5 mL Oral QID  . melatonin  3 mg Oral QHS  . pantoprazole  40 mg Oral BID  . potassium chloride  40 mEq Oral Once  . pregabalin  75 mg Oral BID  . sodium chloride flush  10-40 mL Intracatheter Q12H  . thiamine  100 mg Per Tube Daily  . traZODone  50 mg Oral QHS   Continuous Infusions:  PRN Meds:.albuterol, Gerhardt's butt cream, guaiFENesin-dextromethorphan, HYDROmorphone, ipratropium-albuterol, loperamide HCl, ondansetron (ZOFRAN) IV, Resource ThickenUp Clear, sodium chloride flush  DVT prophylaxis: SCDs Code Status: DNR Family Communication: no family at bedside, called daughter, no answer  Status is: Inpatient  Remains inpatient appropriate because:Core track dependent right now, poor p.o. intake and requiring tube feeds  Dispo: The patient is from: Home              Anticipated d/c is to: SNF              Anticipated d/c date is: > 3 days              Patient currently is not medically stable to d/c.   Consultants:  Palliative care Critical care  Procedures:  4/9 admitted; mass transfusion protocol 4/10 transfer to ICU; EGD, IR embolization 4/11 extubated 4/12 white count remains elevated at 39k, LFTs significantly elevated; continues to have dark red stools; hgb stable; minimal responsiveness; reintubated for increased work of breathing 4/13 LFTs improving. hgb stable. More  responsive this morning. Minimal improvement in respiratory status. Increased BRBPR>>EGD 4/14 LFTs, renal function improving. More encephalopathic this morning. hgb stable. 4/15continues to be severely encephalopathic>head CT neg; progressive hypernatremia 148>151 4/16 improved mental status, improved renal function; extubated; off pressors 4/17 remains encephalopathic 4/18 D/c to floor 4/19 Re-intubated and re-admitted to ICU 4/22 extubated 4/23 transferred to [facility]  Microbiology  Respiratory culture 4/20-MSSA, Enterobacter Cloacae  Antimicrobials: None     Objective: Vitals:   02/05/20 2350 02/06/20 0348 02/06/20 0624 02/06/20 0759  BP: 107/62 108/61  (!) 104/52  Pulse: 79 78  85  Resp: 19 18  19   Temp: 98.8 F (37.1 C) 98 F (36.7 C)  98.2 F (36.8 C)  TempSrc: Oral Oral  Oral  SpO2: 100% 97%  97%  Weight:   64.7 kg   Height:        Intake/Output Summary (Last 24 hours) at 02/06/2020 0919 Last data filed at 02/06/2020 0700 Gross per 24 hour  Intake 1176 ml  Output --  Net 1176 ml   Filed Weights   02/04/20 0512 02/05/20 0359 02/06/20 0624  Weight: 60.7 kg 63.4 kg 64.7 kg    Examination:  Constitutional: No distress Vitals:   02/05/20 2350 02/06/20 0348 02/06/20 0624 02/06/20 0759  BP: 107/62 108/61  (!) 104/52  Pulse: 79 78  85  Resp: 19 18  19  Temp: 98.8 F (37.1 C) 98 F (36.7 C)  98.2 F (36.8 C)  TempSrc: Oral Oral  Oral  SpO2: 100% 97%  97%  Weight:   64.7 kg   Height:       Eyes: No icterus Neck: No tenderness to palpation, no masses, no lymphadenopathy ENMT:  Oropharynx without rashes Respiratory: CTA bilaterally without wheezing or crackles Cardiovascular: Regular rate and rhythm, no murmurs, trace edema Abdomen: Quite tender to palpation, no guarding or rebound Musculoskeletal: no clubbing / cyanosis. Normal muscle tone.  Skin: No rashes seen Neurologic: No focal deficits  Data Reviewed: I have independently reviewed following labs and  imaging studies   CBC: Recent Labs  Lab 01/31/20 0513 02/01/20 0500 02/02/20 0627 02/05/20 0500  WBC 44.7* 35.8* 32.0* 35.3*  NEUTROABS 37.2* 30.4*  --   --   HGB 8.8* 8.8* 8.4* 7.9*  HCT 28.6* 27.4* 26.9* 25.3*  MCV 98.3 97.2 96.8 97.3  PLT 263 377 401* 622*   Basic Metabolic Panel: Recent Labs  Lab 01/31/20 0513 02/01/20 0500 02/02/20 0627 02/05/20 0500  NA 142 141 137 137  K 4.5 3.4* 4.2 4.1  CL 119* 114* 112* 108  CO2 15* 19* 18* 23  GLUCOSE 93 142* 131* 142*  BUN 42* 37* 32* 37*  CREATININE 1.23* 1.17* 1.08* 1.06*  CALCIUM 7.3* 7.8* 7.5* 7.7*  MG 1.9  --  1.5* 1.4*  PHOS  --   --  3.7 3.6   Liver Function Tests: Recent Labs  Lab 01/30/20 1350 02/02/20 0627 02/05/20 0500  AST  --  24 19  ALT  --  21 20  ALKPHOS  --  82 90  BILITOT  --  0.4 0.3  PROT  --  4.1* 4.2*  ALBUMIN 1.2* 1.1* <1.0*   Coagulation Profile: No results for input(s): INR, PROTIME in the last 168 hours. HbA1C: No results for input(s): HGBA1C in the last 72 hours. CBG: Recent Labs  Lab 02/05/20 1154 02/05/20 1610 02/05/20 2020 02/06/20 0351 02/06/20 0816  GLUCAP 118* 102* 124* 136* 147*    Recent Results (from the past 240 hour(s))  C Difficile Quick Screen w PCR reflex     Status: None   Collection Time: 01/28/20  3:01 PM   Specimen: STOOL  Result Value Ref Range Status   C Diff antigen NEGATIVE NEGATIVE Final   C Diff toxin NEGATIVE NEGATIVE Final   C Diff interpretation No C. difficile detected.  Final    Comment: Performed at [facility], [address], [place], [place] [address]     Radiology Studies: No results found.   Pamella Pert, MD, PhD [facility]  Between 7 am - 7 pm I am available, please contact me via Amion or Securechat  Between 7 pm - 7 am I am not available, please contact night coverage MD/APP via Amion

## 2020-02-06 NOTE — Progress Notes (Signed)
  Speech Language Pathology Treatment: Dysphagia  Patient Details Name: Natasha Jacobson MRN: [id number] DOB: October 10, 1950 Today's Date: 02/06/2020 Time: 1100-1110 SLP Time Calculation (min) (ACUTE ONLY): 10 min  Assessment / Plan / Recommendation Clinical Impression  Pt with minimal oral intake, really on accepting small sips of liquids. Still demonstrates dramatic behaviors with sips, including oral holding, swishing, grimacing, gasping. No physiologic cause for pts distress with swallowing or refusal to accept more than tastes of most PO. No advancement or participation in therapy has been possible for diet advancement. Pt will not follow cues for pulmonary hygiene. Will sign off at this time.   HPI HPI: Pt is a 69 yo female admitted with hemorrhagic shock 2/2 bleeding 10mm pseudoaneurysm and recent instrumentation s/p EGD and coil embolization by IR 4/10. Course also significant for UTI, acute on chronic pancreatitis, and ETT 4/10-4/11; reintubated for encephalopathy and hypoxia 4/12-4/16. BSE 4/17 recommended NPO. Pt reintubated 4/19-4/22 due to respiratory distress. PMH also includes: COPD, hearing loss, HTN, thyroid disease.      SLP Plan  Discharge SLP treatment due to (comment)       Recommendations  Diet recommendations: Dysphagia 1 (puree);Thin liquid                Oral Care Recommendations: Oral care BID Follow up Recommendations: Skilled Nursing facility SLP Visit Diagnosis: Dysphagia, unspecified (R13.10) Plan: Discharge SLP treatment due to (comment)       GO               Harlon Ditty, MA CCC-SLP  Acute Rehabilitation Services Pager [phone] Office [phone]   [name] 02/06/2020, 11:11 AM

## 2020-02-06 NOTE — Plan of Care (Signed)
  Problem: Clinical Measurements: Goal: Ability to maintain clinical measurements within normal limits will improve Outcome: Progressing Goal: Will remain free from infection Outcome: Progressing Goal: Respiratory complications will improve Outcome: Progressing Goal: Cardiovascular complication will be avoided Outcome: Progressing   Problem: Safety: Goal: Ability to remain free from injury will improve Outcome: Progressing

## 2020-02-06 NOTE — Plan of Care (Signed)
  Problem: Education: Goal: Knowledge of General Education information will improve Description: Including pain rating scale, medication(s)/side effects and non-pharmacologic comfort measures Outcome: Not Progressing   Problem: Health Behavior/Discharge Planning: Goal: Ability to manage health-related needs will improve Outcome: Not Progressing   Problem: Clinical Measurements: Goal: Ability to maintain clinical measurements within normal limits will improve Outcome: Not Progressing Goal: Will remain free from infection Outcome: Not Progressing Goal: Diagnostic test results will improve Outcome: Not Progressing Goal: Respiratory complications will improve Outcome: Not Progressing Goal: Cardiovascular complication will be avoided Outcome: Not Progressing   Problem: Clinical Measurements: Goal: Will remain free from infection Outcome: Not Progressing   Problem: Clinical Measurements: Goal: Diagnostic test results will improve Outcome: Not Progressing   Problem: Clinical Measurements: Goal: Respiratory complications will improve Outcome: Not Progressing   Problem: Clinical Measurements: Goal: Cardiovascular complication will be avoided Outcome: Not Progressing   Problem: Coping: Goal: Level of anxiety will decrease Outcome: Not Progressing   Problem: Elimination: Goal: Will not experience complications related to bowel motility Outcome: Not Progressing Goal: Will not experience complications related to urinary retention Outcome: Not Progressing   Problem: Elimination: Goal: Will not experience complications related to urinary retention Outcome: Not Progressing   Problem: Pain Managment: Goal: General experience of comfort will improve Outcome: Not Progressing   Problem: Safety: Goal: Ability to remain free from injury will improve Outcome: Not Progressing   Problem: Skin Integrity: Goal: Risk for impaired skin integrity will decrease Outcome: Not  Progressing   Problem: Activity: Goal: Ability to tolerate increased activity will improve Outcome: Not Progressing   Problem: Respiratory: Goal: Ability to maintain a clear airway and adequate ventilation will improve Outcome: Not Progressing   Problem: Role Relationship: Goal: Method of communication will improve Outcome: Not Progressing   Problem: Education: Goal: Ability to identify signs and symptoms of gastrointestinal bleeding will improve Outcome: Not Progressing   Problem: Bowel/Gastric: Goal: Will show no signs and symptoms of gastrointestinal bleeding Outcome: Not Progressing   Problem: Fluid Volume: Goal: Will show no signs and symptoms of excessive bleeding Outcome: Not Progressing   Problem: Clinical Measurements: Goal: Complications related to the disease process, condition or treatment will be avoided or minimized Outcome: Not Progressing

## 2020-02-06 NOTE — Progress Notes (Signed)
Nutrition Follow-up  DOCUMENTATION CODES:   Severe malnutrition in context of chronic illness  INTERVENTION:   - Recommend considering PEG tube placement given ongoing poor PO intake and severe malnutrition  Continue nocturnal tube feeds via Cortrak: - Increase Vital 1.5 to 70 ml/hr to run for 12 hours from 1800 to 0600 (total of 840 ml) - Pro-stat 30 ml daily per tube - Free water 150 ml q 4 hours  Adjusted nocturnal tube feeding regimen provides1360kcal, 72grams of protein, and of H2O (85% of kcal needs, 90% of protein needs).  -Ensure Enlive po TID, each supplement provides 350 kcal and 20 grams of protein  - Encourage adequate PO intake  - Provide feeding assistance as needed  NUTRITION DIAGNOSIS:   Severe Malnutrition related to chronic illness (chronic pancreatitis) as evidenced by severe fat depletion, severe muscle depletion.  Ongoing, being addressed via TF and supplements  GOAL:   Patient will meet greater than or equal to 90% of their needs  Progressing  MONITOR:   Diet advancement, Labs, Weight trends, TF tolerance, I & O's  REASON FOR ASSESSMENT:   Consult Other (please evaluate when the tube feeds can be discontinued)  ASSESSMENT:   69 y.o. female with history of chronic alcoholic pancreatitis s/p exchange of pancreatico-gastrostomy stent at [facility] on 01/12/20. She started developing abdominal pain later in the evening which continued to worsen and 4/9 she began having hematemesis. She also had dark stools following which patient decided to come to the ED. Pain has been continuous epigastric in nature radiating to the back. CT abdomen/pelvis in the ED showed concern for acute on chronic pancreatitis and UA concerning for UTI. No bed available at [facility] to transfer patient so ED MD talked with GI and patient was admitted at [facility].  04/12-reintubated for encephalopathy and hypoxia 04/13-s/p EGD 04/16-extubated, Cortrak  placed (post-pyloric), off pressors, renal function improved 04/19-reintubated 04/22 - extubated 04/26 - s/p MBSS with recommendations for dysphagia 1 diet with nectar-thick liquids  Current weight: 64.7 kg Admit weight: 50.7 kg  Pt with mild pitting edema to BUE and BLE.  Discussed pt with RN and MD. Pt still not taking much via PO and majority of PO intake is liquid. Per RN, pt refusing breakfast this morning. Recommend considering PEG as pt will likely need more permanent access for nutrition support.  Given continued poor PO, will increase nocturnal tube feeds.  Current TF: Vital 1.5 @ 60 ml/hr to run for 12 hours from 1800 to 0600, Pro-stat 30 ml daily, free water 250 ml q 4 hours  Meal Completion: 0-25% x 6 recorded meals (several meals missing)  Medications reviewed and include: Ensure Enlive TID (pt refusing most supplements per [facility]), geriatric MVI, SSI q 4 hours, Creon 12,000 units TID, magic mouthwash, protonix, thiamine  Labs reviewed: magnesium 1.4, hemoglobin 7.9 CBG's: 102-147 x 24 hours  Diet Order:   Diet Order            DIET - DYS 1 Room service appropriate? Yes; Fluid consistency: Thin  Diet effective now              EDUCATION NEEDS:   Not appropriate for education at this time  Skin:  Skin Assessment: Skin Integrity Issues: Other: MASD buttocks/groin  Last BM:  02/05/20 type 7 via rectal tube  Height:   Ht Readings from Last 1 Encounters:  01/14/20 5\' 4"  (1.626 m)    Weight:   Wt Readings from Last 1 Encounters:  02/06/20 64.7 kg    Ideal Body Weight:  54.5 kg  BMI:  Body mass index is 24.48 kg/m.  Estimated Nutritional Needs:   Kcal:  1600-1800  Protein:  80-95 grams  Fluid:  >/= 1.5 L    Gaynell Face, MS, RD, LDN Inpatient Clinical Dietitian Pager: [phone] Weekend/After Hours: [phone]

## 2020-02-07 ENCOUNTER — Inpatient Hospital Stay (HOSPITAL_COMMUNITY): Payer: Medicare Other

## 2020-02-07 DIAGNOSIS — K651 Peritoneal abscess: Secondary | ICD-10-CM

## 2020-02-07 LAB — GLUCOSE, CAPILLARY
Glucose-Capillary: 88 mg/dL (ref 70–99)
Glucose-Capillary: 82 mg/dL (ref 70–99)
Glucose-Capillary: 82 mg/dL (ref 70–99)
Glucose-Capillary: 83 mg/dL (ref 70–99)
Glucose-Capillary: 127 mg/dL — ABNORMAL HIGH (ref 70–99)
Glucose-Capillary: 134 mg/dL — ABNORMAL HIGH (ref 70–99)

## 2020-02-07 MED ORDER — PANTOPRAZOLE SODIUM 40 MG PO PACK
40.0000 mg | PACK | Freq: Two times a day (BID) | ORAL | Status: DC
  Administered 2020-02-07 (×2): 40 mg
  Filled 2020-02-07 (×4): qty 20

## 2020-02-07 MED ORDER — PIPERACILLIN-TAZOBACTAM 3.375 G IVPB
3.3750 g | Freq: Three times a day (TID) | INTRAVENOUS | Status: DC
  Administered 2020-02-07: 3.375 g via INTRAVENOUS

## 2020-02-07 MED ORDER — MIDAZOLAM HCL 2 MG/2ML IJ SOLN
INTRAMUSCULAR | Status: AC
  Filled 2020-02-07: qty 2

## 2020-02-07 MED ORDER — MIDAZOLAM HCL 2 MG/2ML IJ SOLN
INTRAMUSCULAR | Status: AC | PRN
  Administered 2020-02-07: 0.5 mg via INTRAVENOUS

## 2020-02-07 MED ORDER — PIPERACILLIN-TAZOBACTAM 3.375 G IVPB
3.3750 g | Freq: Three times a day (TID) | INTRAVENOUS | Status: DC
  Administered 2020-02-08 – 2020-02-09 (×5): 3.375 g via INTRAVENOUS
  Filled 2020-02-07 (×4): qty 50

## 2020-02-07 MED ORDER — FENTANYL CITRATE (PF) 100 MCG/2ML IJ SOLN
INTRAMUSCULAR | Status: AC
  Filled 2020-02-07: qty 2

## 2020-02-07 MED ORDER — SODIUM CHLORIDE 0.9% FLUSH
5.0000 mL | Freq: Three times a day (TID) | INTRAVENOUS | Status: DC
  Administered 2020-02-07 – 2020-02-09 (×6): 5 mL

## 2020-02-07 MED ORDER — FENTANYL CITRATE (PF) 100 MCG/2ML IJ SOLN
INTRAMUSCULAR | Status: AC | PRN
  Administered 2020-02-07 (×2): 25 ug via INTRAVENOUS

## 2020-02-07 NOTE — Progress Notes (Deleted)
With redness to both buttocks. Cleansed and gerhardt cream applied.

## 2020-02-07 NOTE — Progress Notes (Signed)
Pharmacy Antibiotic Note  Natasha Jacobson is a 69 y.o. female admitted on 01/13/2020 with pneumonia.  Pharmacy has been consulted for zosyn dosing.   Patient known to pharmacy service for previous antibiotic dosing. New orders for zosyn for possible intra-abdominal infection.   Plan: Zosyn 3.375g IV q8 hours Monitor renal function and cultures  Height: 5\' 4"  (162.6 cm) Weight: 60.4 kg (133 lb 2.5 oz) IBW/kg (Calculated) : 54.7  Temp (24hrs), Avg:98.2 F (36.8 C), Min:97.7 F (36.5 C), Max:98.8 F (37.1 C)  Recent Labs  Lab 02/01/20 0500 02/02/20 0627 02/05/20 0500  WBC 35.8* 32.0* 35.3*  CREATININE 1.17* 1.08* 1.06*    Estimated Creatinine Clearance: 44.5 mL/min (A) (by C-G formula based on SCr of 1.06 mg/dL (H)).    Allergies  Allergen Reactions  . Buprenorphine Nausea Only  . Aspirin Nausea Only    GI upset GI upset     Antimicrobials this admission: Zosyn x1 4/9 CTX 4/10>>4/11 Amp 4/12>>4/17  Cefepime 4/20>>4/26 Vancomycin x1 4/20 Linezolid 4/21>>4/22 Zosyn 5/4>>  Microbiology results: 4/9 Ucx: 100K e faecalis (p-sensitive) 4/9 Bcx: ngF 4/10 MRSA PCR: neg 4/10 Ucx: 100K e faecalis (p-sensitive) 4/16 BCx: ngF 4/16 TA: Enterobacter cloacae (S-cefepime, cipro, imipenem, bactrim) 4/20 BCx: ngtd 4/20 UCx: multiple species - suggest recollection 4/20 TA: MSSA, enterobacter cloacae (S-cefepime, cipro, imipenem, bactrim)  Thank you for allowing pharmacy to be a part of this patient's care.  5/20 PharmD., BCPS Clinical Pharmacist 02/07/2020 2:22 PM   Please check [email or web address] for unit-specific pharmacist phone numbers

## 2020-02-07 NOTE — Progress Notes (Signed)
PT Cancellation Note  Patient Details Name: Natasha Jacobson MRN: [id number] DOB: [birth year]   Cancelled Treatment:    Reason Eval/Treat Not Completed: Patient at procedure or test/unavailable   Almer Littleton B Firas Guardado 02/07/2020, 9:05 AM  Merryl Hacker, PT Acute Rehabilitation Services Pager: [phone] Office: [phone]

## 2020-02-07 NOTE — Procedures (Signed)
Interventional Radiology Procedure Note  Procedure: CT drain luq abscess  Complications: None  Estimated Blood Loss: min  Findings: 100cc bloody exudative malodorous fld, c/w infected hematoma cx sent

## 2020-02-07 NOTE — Progress Notes (Signed)
Drain to left lat side flushed with 66ml  Normal saline tolerated well.

## 2020-02-07 NOTE — Progress Notes (Signed)
OT Cancellation Note  Patient Details Name: Larah Kuntzman MRN: [id number] DOB: 30-Oct-1950   Cancelled Treatment:    Reason Eval/Treat Not Completed: Patient at procedure or test/ unavailable(Pt at CT and scheduled for IR later today.)   OT to continue to follow for follow-up OT intervention.  Flora Lipps, OTR/L Acute Rehabilitation Services Pager: [phone] Office: [phone]   Victoriano Campion C 02/07/2020, 9:08 AM

## 2020-02-07 NOTE — Progress Notes (Signed)
PROGRESS NOTE  Natasha Jacobson VEL:[id number] DOB: 1950/12/31 DOA: 01/13/2020 PCP: Concepcion Elk, MD   LOS: 25 days   Brief Narrative / Interim history: 69 year old female with history of chronic alcoholic pancreatitis, status post a recent pancreatico gastrostomy stent at [facility] on 01/12/20, admitted here with acute GI bleed due to splenic artery pseudoaneurysm status post embolization.  She presented to [place] with recurrent abdominal pain and hematemesis, and hospital course was complicated by hypotension and hemorrhagic shock requiring multiple PRBC transfusion, invasive mechanical ventilation as well as vasopressors.  GI was consulted and underwent an EGD which showed active upper GI bleeding, and a follow-up CT angiogram showed a 7 mm pseudoaneurysm over the pancreatic branch of the splenic artery.  IR was consulted due to splenic angiogram and coil embolization of the pseudoaneurysm.  She failed extubation on April 11 and had to be reintubated on April 12 due to increased work of breathing and respiratory distress, eventually being extubated.  Failed again extubation on April 16 and had to be reintubated April 19, she was diagnosed with left-sided pneumonia at that time and placed on antibiotics.  She was eventually extubated April/22nd, transferred to [facility] on April/23rd. patient had persistent leukocytosis and abdominal pain, underwent a CT scan of the abdomen and pelvis on 4/24 which was fairly unremarkable however was without contrast due to slightly elevated creatinine.  Given persistent pain and leukocytosis she had a repeat CT scan on 5/3 with contrast which showed an intra-abdominal abscess for which IR was consulted.  Subjective / 24h Interval events: Complains of pain in her neck, dry mouth. Persistent abdominal pain  Assessment & Plan: Principal Problem Acute on chronic alcoholic pancreatitis, complicated with hemorrhagic shock and acute blood loss anemia due to upper GI bleed, POA-this  is due to a splenic artery pseudoaneurysm, now status post embolization by IR on 4/20. -She has a chronic component of abdominal pain, however had persistent pain over the last several days, worsening, as well as significant leukocytosis.  My initial CT scan on 4/24 was without acute findings it was without contrast.  Given stable renal function normal, she had a repeat CT scan on 5/30 which showed an intra-abdominal abscess.  IR consulted and plans are in place for percutaneous drainage today. -Patient has not been on antibiotics since she is nontoxic-appearing, once IR drained the abscess I will start Zosyn, and tailor antibiotics based on culture data -She also has persistent diarrhea, C. difficile was negative.  This is likely due to tube feeds. -I have discussed with dietary, they recommend to continue keeping the core track in because she has little to no p.o. intake.  They also recommended a PEG tube however this is a national back order.  I hope that her abdominal pain and appetite improved after abscess drainage and we can avoid a PEG altogether  Active Problems Neck pain-over the last couple of days patient has been complaining of progressive and worsening bilateral neck pain.  No masses appreciated, no significant tenderness to palpation, no throat erythema, obtain CT scan of the neck  Acute hypoxic respiratory failure due to left lower lobe aspiration pneumonia-now off mechanical ventilation, respiratory cultures showed MSSA and Enterobacter, status post completed treatment with linezolid and cefepime. -Continue oxymetry monitoring and aspiration precautions, as needed bronchodilators and as tolerated airway clearing techniques with flutter valve and incentive spirometer.  -Respiratory status is stable  AKI with hypernatremia/ hypokalemia/ hyperchloremia/ hyperchloremic non gap metabolic acidosis-patient has had significant diarrhea, C. difficile was  negative.  Creatinine has remained  stable.  Repeat labs in the morning  Acute metabolic encephalopathy -likely component of ICU delirium as well, continues to improve, alert and oriented x4 mentating well right now  Severe calorie protein malnutrition-Continue with Nutritional supplementation, thiamine and multivitamins.  Received albumin 5/2.  She is severely malnourished continue tube feeds as she has little to no p.o. intake  Hypothyroid-Continuelevothyroxine  T2DM(Hgb A1c 5,4)-continue sliding scale.  CBGs stable  CBG (last 3)  Recent Labs    02/06/20 2344 02/07/20 0344 02/07/20 0855  GLUCAP 121* 88 82     Scheduled Meds: . acetaminophen (TYLENOL) oral liquid 160 mg/5 mL  650 mg Per Tube Q6H  . Chlorhexidine Gluconate Cloth  6 each Topical Daily  . feeding supplement (ENSURE ENLIVE)  237 mL Oral TID BM  . feeding supplement (PRO-STAT SUGAR FREE 64)  30 mL Per Tube Daily  . feeding supplement (VITAL 1.5 CAL)  840 mL Per Tube Q24H  . free water  150 mL Per Tube Q4H  . geriatric multivitamins-minerals  15 mL Per Tube Daily  . HYDROmorphone HCl  24 mg Oral Q24H  . insulin aspart  0-6 Units Subcutaneous Q4H  . levothyroxine  50 mcg Per Tube Q0600  . lipase/protease/amylase  12,000 Units Oral TID AC  . magic mouthwash  5 mL Oral QID  . melatonin  3 mg Oral QHS  . pantoprazole  40 mg Oral BID  . potassium chloride  40 mEq Oral Once  . pregabalin  75 mg Oral BID  . sodium chloride flush  10-40 mL Intracatheter Q12H  . thiamine  100 mg Per Tube Daily  . traZODone  50 mg Oral QHS   Continuous Infusions:  PRN Meds:.albuterol, Gerhardt's butt cream, guaiFENesin-dextromethorphan, HYDROmorphone, ipratropium-albuterol, loperamide HCl, ondansetron (ZOFRAN) IV, Resource ThickenUp Clear, sodium chloride flush  DVT prophylaxis: SCDs Code Status: DNR Family Communication: Discussed with daughter on 5/2  Status is: Inpatient  Remains inpatient appropriate because: New findings of intra-abdominal abscess  requiring drainage today.  Still on core track feeding  Dispo: The patient is from: Home              Anticipated d/c is to: SNF              Anticipated d/c date is: > 3 days              Patient currently is not medically stable to d/c.   Consultants:  Palliative care Critical care Interventional radiology  Procedures:  4/9 admitted; mass transfusion protocol 4/10 transfer to ICU; EGD, IR embolization 4/11 extubated 4/12 white count remains elevated at 39k, LFTs significantly elevated; continues to have dark red stools; hgb stable; minimal responsiveness; reintubated for increased work of breathing 4/13 LFTs improving. hgb stable. More responsive this morning. Minimal improvement in respiratory status. Increased BRBPR>>EGD 4/14 LFTs, renal function improving. More encephalopathic this morning. hgb stable. 4/15continues to be severely encephalopathic>head CT neg; progressive hypernatremia 148>151 4/16 improved mental status, improved renal function; extubated; off pressors 4/17 remains encephalopathic 4/18 D/c to floor 4/19 Re-intubated and re-admitted to ICU 4/22 extubated 4/23 transferred to [facility] 4/24 CT abdomen pelvis without acute findings 5/3 CT abdomen pelvis left upper quadrant abscess  5/4 IR abscess drainage  Microbiology  Respiratory culture 4/20-MSSA, Enterobacter Cloacae  Antimicrobials: Zosyn 5/4 >>   Objective: Vitals:   02/06/20 2345 02/07/20 0340 02/07/20 0716 02/07/20 0800  BP: 124/60 (!) 116/53 104/87   Pulse: 85 82 73  Resp: 20 17 20    Temp: 98.6 F (37 C) 97.9 F (36.6 C) 97.7 F (36.5 C)   TempSrc: Oral Oral Oral   SpO2: 96% 96% 99% 99%  Weight:  60.4 kg    Height:        Intake/Output Summary (Last 24 hours) at 02/07/2020 0930 Last data filed at 02/07/2020 0600 Gross per 24 hour  Intake 1020 ml  Output --  Net 1020 ml   Filed Weights   02/05/20 0359 02/06/20 0624 02/07/20 0340  Weight: 63.4 kg 64.7 kg 60.4 kg     Examination:  Constitutional: Complaining of abdominal pain and neck pain Vitals:   02/06/20 2345 02/07/20 0340 02/07/20 0716 02/07/20 0800  BP: 124/60 (!) 116/53 104/87   Pulse: 85 82 73   Resp: 20 17 20    Temp: 98.6 F (37 C) 97.9 F (36.6 C) 97.7 F (36.5 C)   TempSrc: Oral Oral Oral   SpO2: 96% 96% 99% 99%  Weight:  60.4 kg    Height:       Eyes: No scleral icterus Neck: No tenderness to palpation, no masses ENMT: No oropharyngeal erythema, dry mucous membranes Respiratory: Clear bilaterally, no wheezing or crackles Cardiovascular: Regular rate and rhythm, no murmurs, trace edema Abdomen: Deferred today at patient's request, bowel sounds positive Musculoskeletal: No clubbing/cyanosis Skin: No rashes seen Neurologic: No focal deficits  Data Reviewed: I have independently reviewed following labs and imaging studies   CBC: Recent Labs  Lab 02/01/20 0500 02/02/20 0627 02/05/20 0500  WBC 35.8* 32.0* 35.3*  NEUTROABS 30.4*  --   --   HGB 8.8* 8.4* 7.9*  HCT 27.4* 26.9* 25.3*  MCV 97.2 96.8 97.3  PLT 377 401* 622*   Basic Metabolic Panel: Recent Labs  Lab 02/01/20 0500 02/02/20 0627 02/05/20 0500  NA 141 137 137  K 3.4* 4.2 4.1  CL 114* 112* 108  CO2 19* 18* 23  GLUCOSE 142* 131* 142*  BUN 37* 32* 37*  CREATININE 1.17* 1.08* 1.06*  CALCIUM 7.8* 7.5* 7.7*  MG  --  1.5* 1.4*  PHOS  --  3.7 3.6   Liver Function Tests: Recent Labs  Lab 02/02/20 0627 02/05/20 0500  AST 24 19  ALT 21 20  ALKPHOS 82 90  BILITOT 0.4 0.3  PROT 4.1* 4.2*  ALBUMIN 1.1* <1.0*   Coagulation Profile: Recent Labs  Lab 02/06/20 1721  INR 1.2   HbA1C: No results for input(s): HGBA1C in the last 72 hours. CBG: Recent Labs  Lab 02/06/20 1645 02/06/20 1946 02/06/20 2344 02/07/20 0344 02/07/20 0855  GLUCAP 99 135* 121* 88 82    Recent Results (from the past 240 hour(s))  C Difficile Quick Screen w PCR reflex     Status: None   Collection Time: 01/28/20  3:01  PM   Specimen: STOOL  Result Value Ref Range Status   C Diff antigen NEGATIVE NEGATIVE Final   C Diff toxin NEGATIVE NEGATIVE Final   C Diff interpretation No C. difficile detected.  Final    Comment: Performed at [facility], [address] [place], [address] [place]     Radiology Studies: CT ABDOMEN PELVIS W CONTRAST  Result Date: 02/06/2020 CLINICAL DATA:  Abdominal abscess. EXAM: CT ABDOMEN AND PELVIS WITH CONTRAST TECHNIQUE: Multidetector CT imaging of the abdomen and pelvis was performed using the standard protocol following bolus administration of intravenous contrast. CONTRAST:  [id number] OMNIPAQUE IOHEXOL 300 MG/ML  SOLN COMPARISON:  January 28, 2020. FINDINGS:  Lower chest: Moderate size bilateral pleural effusions are noted with adjacent subsegmental atelectasis. Hepatobiliary: No gallstones are noted. Mild intrahepatic and extrahepatic biliary dilatation is noted. The liver is unremarkable. Pancreas: Continued presence of pancreatic calcifications consistent with chronic pancreatitis. Pancreatic duct stent is noted and unchanged in position. Spleen: The spleen is not clearly visualized, but in its place is a 10.5 x 6.6 cm collection of fluid and gas concerning for abscess. Adrenals/Urinary Tract: Adrenal glands appear normal. Mild bilateral hydronephrosis is noted without definite ureteral dilatation or ureteral obstruction. No definite nephrolithiasis is noted. Urinary bladder is unremarkable. Stomach/Bowel: Status post appendectomy. Feeding tube is seen entering and passing through stomach and duodenum with distal tip in the expected position of the proximal jejunum. Stomach is otherwise unremarkable. Stool and fluid is noted throughout the colon, but no bowel obstruction is noted. Rectal tube is noted. Vascular/Lymphatic: Aortic atherosclerosis. No enlarged abdominal or pelvic lymph nodes. Reproductive: Status post hysterectomy. No adnexal masses. Other: Moderate anasarca is noted. Minimal  free fluid is noted in the pelvis. Musculoskeletal: No acute or significant osseous findings. IMPRESSION: Spleen is not clearly visualized, but in the splenic bed there is noted a 10.5 x 6.6 cm collection of fluid and gas concerning for abscess. Moderate size bilateral pleural effusions are noted with adjacent subsegmental atelectasis. Continued presence of pancreatic calcifications consistent with chronic pancreatitis. Pancreatic duct stent is noted and unchanged in position. Moderate anasarca is noted. Mild bilateral hydronephrosis is noted without definite ureteral dilatation or ureteral obstruction. Aortic Atherosclerosis (ICD10-I70.0). Electronically Signed   By: Lupita Raider M.D.   On: 02/06/2020 14:15     Pamella Pert, MD, PhD [facility]  Between 7 am - 7 pm I am available, please contact me via Amion or Securechat  Between 7 pm - 7 am I am not available, please contact night coverage MD/APP via Amion

## 2020-02-07 NOTE — Progress Notes (Signed)
Went down for CT scan and IR by bed awake and alert. Stable.

## 2020-02-07 NOTE — Consult Note (Signed)
Chief Complaint: Patient was seen in consultation today for splenic bed abscess drain placement Chief Complaint  Patient presents with  . Abdominal Pain   at the request of Dr Wendy Poet Gherghe   Supervising Physician: Ruel FavorsShick, Trevor  [facility]  History of Present Illness: Natasha Jacobson is a 69 y.o. female   Hx ETOH abuse Chronic pancreatitis Recent pancreatico gastrostomy stent [place] 01/12/20 Was seen in IR for emergent splenic artery pseudoaneurysm GI bleed--- Embolization 01/14/20 Off and on intubation; respiratory distress Continued abd pain and leukocytosis; afeb  CT yesterday:  IMPRESSION: Spleen is not clearly visualized, but in the splenic bed there is noted a 10.5 x 6.6 cm collection of fluid and gas concerning for Abscess.  Request now for abscess drain placement Imaging reviewed with IR Rad Approved for procedure Now scheduled for same  Past Medical History:  Diagnosis Date  . 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 Alcohol-induced chronic pancreatitis (HCC)   . Brittle bone disease   . Chronic pain syndrome   . COPD (chronic obstructive pulmonary disease) (HCC)   . Hearing loss, right 07/08/2016  . Hepatitis C    Eradicated with antiviral treatment per patient  . High cholesterol   . Hypertension   . Hypopotassemia   . Hypothyroidism 07/08/2016  . Prediabetes   . SSS (sick sinus syndrome) (HCC) 09/06/2015  . Thyroid disease   . Vitamin D deficiency     Past Surgical History:  Procedure Laterality Date  . ABDOMINAL HYSTERECTOMY    . APPENDECTOMY    . ERCP     multiple with stents placed   . ESOPHAGOGASTRODUODENOSCOPY N/A 01/17/2020   Procedure: ESOPHAGOGASTRODUODENOSCOPY (EGD);  Surgeon: Lemar LoftyMansouraty, Gabriel Jr., MD;  [facility];  Service: Gastroenterology;  Laterality: N/A;  . ESOPHAGOGASTRODUODENOSCOPY (EGD) WITH PROPOFOL N/A 01/14/2020   Procedure: ESOPHAGOGASTRODUODENOSCOPY  (EGD) WITH PROPOFOL;  Surgeon: Benancio DeedsArmbruster, Steven P, MD;  [facility];  Service: Gastroenterology;  Laterality: N/A;  . FOREIGN BODY REMOVAL  01/17/2020   Procedure: FOREIGN BODY REMOVAL;  Surgeon: Meridee ScoreMansouraty, Netty StarringGabriel Jr., MD;  [facility];  Service: Gastroenterology;;  gastric lavage  . IR ANGIOGRAM SELECTIVE EACH ADDITIONAL VESSEL  01/15/2020  . IR ANGIOGRAM VISCERAL SELECTIVE  01/15/2020  . IR EMBO ART  VEN HEMORR LYMPH EXTRAV  INC GUIDE ROADMAPPING  01/15/2020  . IR US GUIDE VASC ACCESS RIGHT  01/15/2020  . PACEMAKER IMPLANT    . TONSILLECTOMY      Allergies: Buprenorphine and Aspirin  Medications: Prior to Admission medications   Medication Sig Start Date End Date Taking? Authorizing Provider  acetaminophen (TYLENOL) 500 MG tablet Take 2 tablets by mouth every 8 (eight) hours as needed. 11/01/19   [provider]  albuterol (VENTOLIN HFA) 108 (90 Base) MCG/ACT inhaler Inhale 2 puffs into the lungs every 4 (four) hours as needed. 11/24/18   [provider]  amLODipine (NORVASC) 5 MG tablet Take 1 tablet by mouth daily. 09/22/19   [provider]  baclofen (LIORESAL) 10 MG tablet Take 1 tablet by mouth daily as needed. 11/11/19   [provider]  ergocalciferol (VITAMIN D2) 1.25 MG (50000 UT) capsule Take 1 capsule by mouth once a week. 11/18/19   [provider]  fenofibrate (TRICOR) 48 MG tablet Take 1 tablet by mouth daily. 05/13/18   [provider]  gabapentin (NEURONTIN) 300 MG capsule Take 1 capsule by mouth 3 (three) times daily. 11/11/19 11/10/20  [provider]  levothyroxine (SYNTHROID) 50 MCG tablet Take 50 mcg by mouth daily before breakfast.    [provider]  Melatonin 5 MG CAPS Take 2 capsules by mouth at bedtime.    [provider]  Oxycodone HCl 10 MG TABS Take 10 mg by mouth every 6 (six) hours as needed for pain. 01/12/20   [provider]  Pancrelipase, Lip-Prot-Amyl,  (CREON) 24000-76000 units CPEP Take 1 capsule by mouth 3 (three) times daily. 08/18/19   [provider]  pantoprazole (PROTONIX) 40 MG tablet Take 40 mg by mouth daily.    [provider]  polyethylene glycol powder (GLYCOLAX/MIRALAX) 17 GM/SCOOP powder Take 17 g by mouth as needed.    [provider]  senna (SENOKOT) 8.6 MG TABS tablet Take 1 tablet by mouth as needed for mild constipation.    [provider]  Tiotropium Bromide Monohydrate (SPIRIVA RESPIMAT) 1.25 MCG/ACT AERS Inhale 2 puffs into the lungs daily. 11/15/19 11/14/20  [provider]     Family History  Problem Relation Age of Onset  . Lung cancer Mother   . Emphysema Mother   . Thyroid disease Mother   . Heart block Father   . Heart attack Brother   . Thyroid disease Daughter   . Thyroid disease Granddaughter     Social History   Socioeconomic History  . Marital status: Widowed    Spouse name: Not on file  . Number of children: Not on file  . Years of education: Not on file  . Highest education level: Not on file  Occupational History  . Not on file  Tobacco Use  . Smoking status: Current Every Day Smoker    Types: Cigarettes  . Smokeless tobacco: Never Used  Substance and Sexual Activity  . Alcohol use: Not Currently  . Drug use: Not Currently  . Sexual activity: Not on file  Other Topics Concern  . Not on file  Social History Narrative   Widowed 1 daughter daughter lives in [place]   Income through [place] survivor pension and Social Security survivor benefits   Smoker has not been able to quit   No alcohol since April 2013   No drug use no other tobacco   Social Determinants of Corporate investment banker Strain:   . Difficulty of Paying Living Expenses:   Food Insecurity:   . Worried About Programme researcher, broadcasting/film/video in the Last Year:   . Barista in the Last Year:   Transportation Needs:   . Freight forwarder (Medical):   Marland Kitchen Lack of Transportation  (Non-Medical):   Physical Activity:   . Days of Exercise per Week:   . Minutes of Exercise per Session:   Stress:   . Feeling of Stress :   Social Connections:   . Frequency of Communication with Friends and Family:   . Frequency of Social Gatherings with Friends and Family:   . Attends Religious Services:   . Active Member of Clubs or Organizations:   . Attends Banker Meetings:   Marland Kitchen Marital Status:     Review of Systems: A 12 point ROS discussed and pertinent positives are indicated in the HPI above.  All other systems are negative.  Review of Systems  Constitutional: Positive for activity change, appetite change, fatigue and unexpected weight change. Negative for fever.  Cardiovascular: Negative for chest pain.  Gastrointestinal: Positive for abdominal pain and nausea.  Neurological: Positive for weakness.  Psychiatric/Behavioral: Negative for behavioral problems and confusion.    Vital Signs: BP 104/87 (BP Location: Left Wrist)   Pulse 73   Temp 97.7 F (36.5 C) (Oral)   Resp 20   Ht 5\' 4"  (1.626 m)   Wt 133 lb 2.5 oz (60.4 kg)   SpO2 99%   BMI 22.86 kg/m   Physical Exam Vitals reviewed.  Cardiovascular:     Rate and Rhythm: Normal rate and regular rhythm.  Pulmonary:     Breath sounds: Normal breath sounds.  Abdominal:     General: Abdomen is flat.     Palpations: Abdomen is soft.     Tenderness: There is abdominal tenderness.  Skin:    General: Skin is warm and dry.  Neurological:     Mental Status: She is alert and oriented to person, place, and time.  Psychiatric:        Behavior: Behavior normal.     Imaging: CT ABDOMEN WO CONTRAST  Result Date: 01/28/2020 CLINICAL DATA:  Acute on chronic pancreatitis EXAM: CT ABDOMEN WITHOUT CONTRAST TECHNIQUE: Multidetector CT imaging of the abdomen was performed following the standard protocol without IV contrast. COMPARISON:  01/14/2020 FINDINGS: Lower chest: There are small bilateral pleural  effusions with compressive lower lobe atelectasis. Hepatobiliary: No focal liver abnormality is seen. No gallstones, gallbladder wall thickening, or biliary dilatation. Pancreas: Pancreatic parenchymal calcifications consistent with chronic calcific pancreatitis. Embolic coils are seen at the site of the previous pseudoaneurysm abutting the body of the pancreas. Pancreatic stent extends into the gastric lumen unchanged. Spleen: Normal in size without focal abnormality. Adrenals/Urinary Tract: No renal calculi. Fullness of the bilateral renal pelves without frank hydronephrosis. Unremarkable unenhanced appearance of the renal parenchyma. The adrenals are unremarkable. Stomach/Bowel: Enteric catheter extends into the proximal jejunum. There is no bowel obstruction or ileus. Continued gas fluid levels throughout the colon. Vascular/Lymphatic: Extensive atherosclerosis of the aorta. No pathologic adenopathy. Other: There is extensive body wall edema and mesenteric edema. Trace ascites is seen throughout the upper abdomen. No free intraperitoneal gas. Musculoskeletal: No acute or destructive bony lesions. IMPRESSION: 1. Exam limited by the lack of intravenous contrast. 2. Interval embolization of a splenic artery pseudoaneurysm abutting the pancreatic body. 3. Diffuse body wall and mesenteric edema, with trace upper abdominal ascites. 4. Small bilateral pleural effusions with bibasilar atelectasis. 5. Sequela of chronic calcific pancreatitis. Electronically Signed   By: Sharlet Salina M.D.   On: 01/28/2020 22:16   DG Chest 1 View  Result Date: 01/29/2020 CLINICAL DATA:  Dyspnea EXAM: CHEST  1 VIEW COMPARISON:  01/24/2020 FINDINGS: Interval endotracheal extubation and removal of a right neck vascular catheter. Partially imaged enteric feeding tube remains in position. New or increased, layering right pleural effusion and no significant change in layering left pleural effusion with associated atelectasis or  consolidation. Calcified, benign pulmonary nodules. Left chest multi lead pacer. IMPRESSION: 1. Interval endotracheal extubation and removal of a right neck vascular catheter. Partially imaged enteric feeding tube remains in position. 2. New or increased, layering right pleural effusion and no significant change in layering left pleural effusion with associated atelectasis or consolidation. Electronically Signed   By: Lauralyn Primes M.D.   On: 01/29/2020 13:53   DG Chest 2 View  Result Date: 01/13/2020 CLINICAL DATA:  Shortness of breath EXAM: CHEST - 2 VIEW COMPARISON:  06/16/2019 FINDINGS: Old granulomatous disease. Left pacer remains in place, unchanged. No confluent opacities or effusions. Heart is normal size. Aortic atherosclerosis. No acute bony abnormality. IMPRESSION:  No acute cardiopulmonary disease. Old granulomatous disease. Electronically Signed   By: Charlett Nose M.D.   On: 01/13/2020 18:54   DG Abd 1 View  Result Date: 01/24/2020 CLINICAL DATA:  Nasogastric tube placement EXAM: ABDOMEN - 1 VIEW COMPARISON:  None. FINDINGS: Dobbhoff tube with the tip projecting over the jejunum. Pancreatic stent is present. There is no bowel dilatation to suggest obstruction. There is no evidence of pneumoperitoneum, portal venous gas or pneumatosis. There are no pathologic calcifications along the expected course of the ureters. The osseous structures are unremarkable. IMPRESSION: Dobbhoff tube with the tip projecting over the jejunum. Electronically Signed   By: Elige Ko   On: 01/24/2020 12:33   CT HEAD WO CONTRAST  Result Date: 01/19/2020 CLINICAL DATA:  Encephalopathy EXAM: CT HEAD WITHOUT CONTRAST TECHNIQUE: Contiguous axial images were obtained from the base of the skull through the vertex without intravenous contrast. COMPARISON:  None. FINDINGS: Brain: No acute intracranial abnormality. Specifically, no hemorrhage, hydrocephalus, mass lesion, acute infarction, or significant intracranial injury.  Mild age related volume loss. Vascular: No hyperdense vessel or unexpected calcification. Skull: No acute calvarial abnormality. Sinuses/Orbits: Mucosal thickening throughout the paranasal sinuses. Air-fluid level in the left maxillary sinus. Mastoid air cells clear. Orbital soft tissues unremarkable. Other: None IMPRESSION: No acute intracranial abnormality. Acute on chronic sinusitis. Electronically Signed   By: Charlett Nose M.D.   On: 01/19/2020 11:33   CT ANGIO ABDOMEN W &/OR WO CONTRAST  Addendum Date: 01/14/2020   ADDENDUM REPORT: 01/14/2020 16:45 ADDENDUM: PRA, Bing Neighbors, states that Dr. Deanne Coffer has viewed the CT scan and is aware of the findings. Dr. Deanne Coffer is currently performing a procedure on this patient. Electronically Signed   By: Elberta Fortis M.D.   On: 01/14/2020 16:45   Result Date: 01/14/2020 CLINICAL DATA:  GI bleed. Recent abdominal distention with nausea and vomiting. History of chronic pancreatitis. Recent stent exchange. EXAM: CT ANGIOGRAPHY ABDOMEN TECHNIQUE: Multidetector CT imaging of the abdomen was performed using the standard protocol during bolus administration of intravenous contrast. Multiplanar reconstructed images and MIPs were obtained and reviewed to evaluate the vascular anatomy. CONTRAST:  80mL OMNIPAQUE IOHEXOL 350 MG/ML SOLN COMPARISON:  01/13/2020 and 10/24/2019 FINDINGS: VASCULAR Aorta: Abdominal aorta is normal in caliber and demonstrates mild calcified plaque distally. Celiac: Celiac axis is patent. Pancreatic branch of the celiac demonstrates a 7 mm focal dilatation likely pseudoaneurysm. There is a 4 mm hyperdense focus of contrast approximately 1 cm to the left of the pseudoaneurysm as this hyperdense focus lies just inside the gastric wall with the stent crosses into the stomach. This hyperdense blush communicates with layering contrast over the dependent portion of the stomach likely acute hemorrhage. This area hemorrhage is larger on the delayed images.  SMA: SMA is patent. Renals: Single bilateral renal arteries are patent with focal narrowing over the proximal 1 cm of the right renal artery. IMA: Patent diminutive IMA. Inflow: Calcified plaque over the visualized iliac arteries which are otherwise patent. Veins: Unremarkable. Review of the MIP images confirms the above findings. NON-VASCULAR Lower chest: Minimal bibasilar dependent atelectasis. Small amount right pleural fluid. Several calcified pulmonary granulomas. Remainder of the lung bases are unremarkable. Hepatobiliary: Liver and gallbladder are unremarkable. Mild periportal edema is present. Pancreas: Changes of chronic pancreatitis with calcifications throughout the pancreas. Patient stent is unchanged which has pigtail over the second portion the duodenum and crosses the pancreas and stomach wall with pigtail in the body of the stomach. Spleen: Unremarkable. Adrenals/Urinary Tract: Adrenal  glands are normal. Kidneys are normal size and demonstrate a small right renal cyst unchanged. There are slight worsening patchy areas of low-attenuation throughout the cortex which may be due to infarction. No hydronephrosis. Stomach/Bowel: Interval significant distention of the stomach containing heterogeneous density material with mottled air collection. This heterogeneous slight increased density is likely due to mixed hemorrhage. This slightly hyperdense material extends into the duodenum. The visualized proximal jejunal loops are dilated measuring up to 3.8 cm. Mild diverticulosis of the colon. Colon is air and fluid-filled. Subtle changes which could reflect mild pneumatosis over the descending colon. Lymphatic: No adenopathy. Other: Mild amount of free peritoneal fluid is present which is new since the previous exam. No free peritoneal air. Musculoskeletal: Unchanged. IMPRESSION: VASCULAR 1. 7 mm pseudoaneurysm over a pancreatic branch of the celiac axis at the level of the midbody of the pancreas just left of  midline. There is evidence of acute hemorrhage occurring 1 cm to the left of this pseudoaneurysm just inside the gastric wall at the site of stent crossing into the stomach. 2. Normal caliber aorta with atherosclerotic disease. Aortic Atherosclerosis (ICD10-I70.0). 3. Patent bilateral renal arteries with focal narrowing of the proximal 1 cm of the right renal artery. NON-VASCULAR 1. Interval distension of the stomach and duodenum with heterogeneous slightly hyperdense material likely hemorrhage. Presumed site of this acute hemorrhage as described above. 2. Dilated visualized proximal small bowel loops. Air and fluid throughout the visualized colon with possible pneumatosis involving the descending colon. 3. Worsening patchy bilateral low attenuation areas over the renal cortex which may be due to infarction. 4.  Worsening free peritoneal fluid. 5.  Evidence of chronic pancreatitis. Ordering physician has been paged. Electronically Signed: By: Elberta Fortis M.D. On: 01/14/2020 16:33   CT ABDOMEN PELVIS W CONTRAST  Result Date: 02/06/2020 CLINICAL DATA:  Abdominal abscess. EXAM: CT ABDOMEN AND PELVIS WITH CONTRAST TECHNIQUE: Multidetector CT imaging of the abdomen and pelvis was performed using the standard protocol following bolus administration of intravenous contrast. CONTRAST:  OMNIPAQUE IOHEXOL 300 MG/ML  SOLN COMPARISON:  January 28, 2020. FINDINGS: Lower chest: Moderate size bilateral pleural effusions are noted with adjacent subsegmental atelectasis. Hepatobiliary: No gallstones are noted. Mild intrahepatic and extrahepatic biliary dilatation is noted. The liver is unremarkable. Pancreas: Continued presence of pancreatic calcifications consistent with chronic pancreatitis. Pancreatic duct stent is noted and unchanged in position. Spleen: The spleen is not clearly visualized, but in its place is a 10.5 x 6.6 cm collection of fluid and gas concerning for abscess. Adrenals/Urinary Tract: Adrenal glands  appear normal. Mild bilateral hydronephrosis is noted without definite ureteral dilatation or ureteral obstruction. No definite nephrolithiasis is noted. Urinary bladder is unremarkable. Stomach/Bowel: Status post appendectomy. Feeding tube is seen entering and passing through stomach and duodenum with distal tip in the expected position of the proximal jejunum. Stomach is otherwise unremarkable. Stool and fluid is noted throughout the colon, but no bowel obstruction is noted. Rectal tube is noted. Vascular/Lymphatic: Aortic atherosclerosis. No enlarged abdominal or pelvic lymph nodes. Reproductive: Status post hysterectomy. No adnexal masses. Other: Moderate anasarca is noted. Minimal free fluid is noted in the pelvis. Musculoskeletal: No acute or significant osseous findings. IMPRESSION: Spleen is not clearly visualized, but in the splenic bed there is noted a 10.5 x 6.6 cm collection of fluid and gas concerning for abscess. Moderate size bilateral pleural effusions are noted with adjacent subsegmental atelectasis. Continued presence of pancreatic calcifications consistent with chronic pancreatitis. Pancreatic duct stent is noted and  unchanged in position. Moderate anasarca is noted. Mild bilateral hydronephrosis is noted without definite ureteral dilatation or ureteral obstruction. Aortic Atherosclerosis (ICD10-I70.0). Electronically Signed   By: Lupita Raider M.D.   On: 02/06/2020 14:15   CT ABDOMEN PELVIS W CONTRAST  Result Date: 01/13/2020 CLINICAL DATA:  69 year old female with abdominal distention, nausea vomiting. History of chronic pancreatitis. Status post stent exchange. EXAM: CT ABDOMEN AND PELVIS WITH CONTRAST TECHNIQUE: Multidetector CT imaging of the abdomen and pelvis was performed using the standard protocol following bolus administration of intravenous contrast. CONTRAST:  80mL OMNIPAQUE IOHEXOL 300 MG/ML  SOLN COMPARISON:  CT abdomen pelvis dated 10/24/2019. FINDINGS: Lower chest: There is  mild emphysematous changes of the visualized lung bases. Right lung base linear atelectasis/scarring. There is a small right middle lobe granuloma. Pacemaker wires noted. There is no intra-abdominal free air or free fluid. Hepatobiliary: The liver is unremarkable. No intrahepatic biliary ductal dilatation. The gallbladder is unremarkable. Pancreas: There is diffuse coarse calcification of the pancreas with moderate atrophy of the distal gland sequela of chronic pancreatitis. Mild peripancreatic haziness noted. Correlation with pancreatic enzymes recommended to exclude recurrent acute pancreatitis. A pancreatic stent noted with pigtail ends in the second portion of the duodenum and in the body of the stomach. No peripancreatic fluid collection or pseudocyst. Spleen: Normal in size without focal abnormality. Adrenals/Urinary Tract: The adrenal glands are unremarkable. Which shaped areas of hypoenhancement in the kidneys bilaterally, left greater right concerning for areas of renal parenchyma infarct. These are new since the prior CT. Correlation with urinalysis recommended to exclude pyelonephritis. There is a 1 cm right renal interpolar cyst. There is no hydronephrosis on either side. The visualized ureters and urinary bladder appear unremarkable. Stomach/Bowel: The stomach is distended with ingested content. There is moderate stool throughout the colon. There is no bowel obstruction or active inflammation. Appendectomy. Vascular/Lymphatic: There is advanced aortoiliac atherosclerotic disease. There is a 2.3 cm infrarenal aortic ectasia. The IVC is unremarkable. No portal venous gas. There is no adenopathy. Reproductive: Hysterectomy. No adnexal masses. Other: None Musculoskeletal: Degenerative changes primarily at L4-L5. No acute osseous pathology. IMPRESSION: 1. Mild peripancreatic haziness may represent acute on chronic pancreatitis. Correlation with pancreatic enzymes recommended. A pancreatic stent with pigtail  ends in the second portion of the duodenum and in the body of the stomach noted. No peripancreatic fluid collection or pseudocyst. 2. Bilateral renal parenchyma wedge-shaped infarcts, new since the prior CT. Correlation with urinalysis recommended to exclude pyelonephritis. 3. No bowel obstruction. 4. Aortic Atherosclerosis (ICD10-I70.0). Electronically Signed   By: Elgie Collard M.D.   On: 01/13/2020 19:05   IR Angiogram Visceral Selective  Result Date: 01/15/2020 CLINICAL DATA:  GI bleed. 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 EXAM: SELECTIVE VISCERAL ARTERIOGRAPHY; ADDITIONAL ARTERIOGRAPHY; IR ULTRASOUND GUIDANCE VASC ACCESS RIGHT; IR EMBO ART VEN HEMORR LYMPH EXTRAV INC GUIDE ROADMAPPING ANESTHESIA/SEDATION: Patient was intubated and receiving IV fentanyl MEDICATIONS: Lidocaine 1% subcutaneous CONTRAST:  Omnipaque 300 IA PROCEDURE: The procedure was performed with implied emergent consent. Right femoral region prepped and draped in usual sterile fashion. Maximal barrier sterile technique was utilized including caps, mask, sterile gowns, sterile gloves, sterile drape, hand hygiene and skin antiseptic. The right common femoral artery was localized under ultrasound. Under real-time ultrasound guidance, the vessel was accessed with a 21-gauge micropuncture needle, exchanged over a 018 guidewire for a transitional dilator, through which a 035 guidewire was advanced. Over this, a 5 [place] vascular sheath was placed, through which a 5 [place]  C2 catheter was advanced and used to selectively catheterize the celiac axis for selective arteriography. This was exchanged for a 5 [place] Sos catheter. Through this, a coaxial microcatheter with 016 guidewire were advanced across the lesion in the mid splenic artery. Coil embolization immediately distal to the lesion performed with 2, 3, and 4 mm interlock coils. Separately, 5 mm coils x2 placed in the pseudoaneurysm. Separately, 2, 3, and 4  mm interlock coils were used in the native splenic artery immediately proximal to the lesion. Hemostasis of flow was achieved. Follow-up arteriogram was performed. The catheter was removed. Because of coagulopathy, sheath was secured with 0 silk suture and placed to flush system. The patient tolerated the procedure well. 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 Technically successful coil embolization of the splenic artery immediately distal and proximal to the lesion. IMPRESSION: 1. 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 Electronically Signed   By: Corlis Leak M.D.   On: 01/15/2020 09:49   IR Angiogram Selective Each Additional Vessel  Result Date: 01/15/2020 CLINICAL DATA:  GI bleed. 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 EXAM: SELECTIVE VISCERAL ARTERIOGRAPHY; ADDITIONAL ARTERIOGRAPHY; IR ULTRASOUND GUIDANCE VASC ACCESS RIGHT; IR EMBO ART VEN HEMORR LYMPH EXTRAV INC GUIDE ROADMAPPING ANESTHESIA/SEDATION: Patient was intubated and receiving IV fentanyl MEDICATIONS: Lidocaine 1% subcutaneous CONTRAST:  Omnipaque 300 IA PROCEDURE: The procedure was performed with implied emergent consent. Right femoral region prepped and draped in usual sterile fashion. Maximal barrier sterile technique was utilized including caps, mask, sterile gowns, sterile gloves, sterile drape, hand hygiene and skin antiseptic. The right common femoral artery was localized under ultrasound. Under real-time ultrasound guidance, the vessel was accessed with a 21-gauge micropuncture needle, exchanged over a 018 guidewire for a transitional dilator, through which a 035 guidewire was advanced. Over this, a 5 [place] vascular sheath was placed, through which a 5 [place] C2 catheter was advanced and used to selectively catheterize the celiac  axis for selective arteriography. This was exchanged for a 5 [place] Sos catheter. Through this, a coaxial microcatheter with 016 guidewire were advanced across the lesion in the mid splenic artery. Coil embolization immediately distal to the lesion performed with 2, 3, and 4 mm interlock coils. Separately, 5 mm coils x2 placed in the pseudoaneurysm. Separately, 2, 3, and 4 mm interlock coils were used in the native splenic artery immediately proximal to the lesion. Hemostasis of flow was achieved. Follow-up arteriogram was performed. The catheter was removed. Because of coagulopathy, sheath was secured with 0 silk suture and placed to flush system. The patient tolerated the procedure well. 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 Technically successful coil embolization of the splenic artery immediately distal and proximal to the lesion. IMPRESSION: 1. 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 Electronically Signed   By: Corlis Leak M.D.   On: 01/15/2020 09:49   IR US Guide Vasc Access Right  Result Date: 01/15/2020 CLINICAL DATA:  GI bleed. 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 EXAM: SELECTIVE VISCERAL ARTERIOGRAPHY; ADDITIONAL ARTERIOGRAPHY; IR ULTRASOUND GUIDANCE VASC ACCESS RIGHT; IR EMBO ART VEN HEMORR LYMPH EXTRAV INC GUIDE ROADMAPPING ANESTHESIA/SEDATION: Patient was intubated and receiving IV fentanyl MEDICATIONS: Lidocaine 1% subcutaneous CONTRAST:  Omnipaque 300 IA PROCEDURE: The procedure was performed with implied emergent consent.  Right femoral region prepped and draped in usual sterile fashion. Maximal barrier sterile technique was utilized including caps, mask, sterile gowns, sterile gloves, sterile drape, hand hygiene and skin antiseptic. The right common femoral artery was localized under  ultrasound. Under real-time ultrasound guidance, the vessel was accessed with a 21-gauge micropuncture needle, exchanged over a 018 guidewire for a transitional dilator, through which a 035 guidewire was advanced. Over this, a 5 [place] vascular sheath was placed, through which a 5 [place] C2 catheter was advanced and used to selectively catheterize the celiac axis for selective arteriography. This was exchanged for a 5 [place] Sos catheter. Through this, a coaxial microcatheter with 016 guidewire were advanced across the lesion in the mid splenic artery. Coil embolization immediately distal to the lesion performed with 2, 3, and 4 mm interlock coils. Separately, 5 mm coils x2 placed in the pseudoaneurysm. Separately, 2, 3, and 4 mm interlock coils were used in the native splenic artery immediately proximal to the lesion. Hemostasis of flow was achieved. Follow-up arteriogram was performed. The catheter was removed. Because of coagulopathy, sheath was secured with 0 silk suture and placed to flush system. The patient tolerated the procedure well. 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 Technically successful coil embolization of the splenic artery immediately distal and proximal to the lesion. IMPRESSION: 1. 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 Electronically Signed   By: Corlis Leak M.D.   On: 01/15/2020 09:49   DG Chest Port 1 View  Result Date: 01/24/2020 CLINICAL DATA:  ET tube, COPD EXAM: PORTABLE CHEST 1 VIEW COMPARISON:  01/23/2020 FINDINGS: Endotracheal tube tip is 3.7 cm above the carina. Left pacer remains in place, unchanged. Cardiomegaly. Increasing left lung airspace disease. Calcified granulomas in the lungs bilaterally. Underlying COPD. IMPRESSION: Endotracheal tube 3.7 cm above the carina. Airspace disease worsening  throughout the left lung, most confluent in the left lung base concerning for pneumonia. Cardiomegaly, COPD. Electronically Signed   By: Charlett Nose M.D.   On: 01/24/2020 00:49   DG Chest Port 1 View  Result Date: 01/23/2020 CLINICAL DATA:  Shortness of breath EXAM: PORTABLE CHEST 1 VIEW COMPARISON:  01/22/2020 FINDINGS: Left pacer remains in place, unchanged. There is hyperinflation of the lungs compatible with COPD. Heart is normal size. Aortic atherosclerosis. Calcified granulomas in the lungs bilaterally. Bibasilar atelectasis or scarring, similar to prior study. Possible small effusions bilaterally. No acute bony abnormality. IMPRESSION: Old granulomatous disease. Bibasilar scarring or atelectasis. Possible small bilateral effusions. Emphysema. Electronically Signed   By: Charlett Nose M.D.   On: 01/23/2020 22:55   DG Chest Port 1 View  Result Date: 01/22/2020 CLINICAL DATA:  Dyspnea. EXAM: PORTABLE CHEST 1 VIEW COMPARISON:  January 21, 2020. FINDINGS: The heart size and mediastinal contours are within normal limits. Feeding tube is seen entering the stomach. Left-sided pacemaker is unchanged in position. Right internal jugular catheter is unchanged. No pneumothorax is noted. Calcified splenic granulomata are noted bilaterally. Mild bibasilar subsegmental atelectasis is noted with possible small left pleural effusion. The visualized skeletal structures are unremarkable. IMPRESSION: Mild bibasilar subsegmental atelectasis is noted with possible small left pleural effusion. Aortic Atherosclerosis (ICD10-I70.0). Electronically Signed   By: Lupita Raider M.D.   On: 01/22/2020 12:52   DG CHEST PORT 1 VIEW  Result Date: 01/21/2020 CLINICAL DATA:  Hypoxia, acute pancreatitis EXAM: PORTABLE CHEST 1 VIEW COMPARISON:  01/19/2020  chest radiograph. FINDINGS: Enteric tube enters stomach with the tip not seen on this image. 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 Stable cardiomediastinal  silhouette with normal heart size. No pneumothorax. Small bilateral pleural effusions are similar. No pulmonary edema. Stable small calcified granulomas scattered in the right lung. Hazy left lung base opacity is similar. IMPRESSION: 1. Small bilateral pleural effusions, similar. 2. Stable hazy left lung base opacity, favor atelectasis. Electronically Signed   By: Delbert Phenix M.D.   On: 01/21/2020 13:20   DG CHEST PORT 1 VIEW  Result Date: 01/19/2020 CLINICAL DATA:  Altered mental status. Bilateral lower extremity swelling. EXAM: PORTABLE CHEST 1 VIEW COMPARISON:  January 17, 2020. FINDINGS: The heart size and mediastinal contours are within normal limits. Endotracheal and nasogastric tubes are unchanged in position. Right internal jugular catheter is unchanged. Left-sided pacemaker is noted. Mild bibasilar subsegmental atelectasis is noted with possible small left pleural effusion. The visualized skeletal structures are unremarkable. IMPRESSION: Stable support apparatus. Mild bibasilar subsegmental atelectasis is noted with possible small left pleural effusion. Aortic Atherosclerosis (ICD10-I70.0). Electronically Signed   By: Lupita Raider M.D.   On: 01/19/2020 10:17   DG Chest Port 1 View  Result Date: 01/17/2020 CLINICAL DATA:  69 year old female with history of acute respiratory failure. EXAM: PORTABLE CHEST 1 VIEW COMPARISON:  Chest x-ray 01/16/2020. FINDINGS: An endotracheal tube is in place with tip 3.4 cm above the carina. There is a right-sided internal jugular central venous catheter with tip terminating in the distal superior vena cava. Left-sided pacemaker device in place with lead tips projecting over the expected location of the right atrium and right ventricle. A nasogastric tube is seen extending into the stomach, however, the tip of the nasogastric tube extends below the lower margin of the image. Lung volumes are low. Bibasilar opacities are noted, favored to reflect areas of subsegmental  atelectasis, although underlying airspace consolidation in the medial aspect of the left lower lobe is not excluded. Small left pleural effusion. No definite right pleural effusion. No evidence of pulmonary edema. No pneumothorax. Several small calcified granulomas are noted in the lungs bilaterally. No other suspicious appearing pulmonary nodules or masses are noted. Heart size is normal. Upper mediastinal contours are within normal limits. Aortic atherosclerosis. IMPRESSION: 1. Support apparatus, as above. 2. Low lung volumes with probable bibasilar subsegmental atelectasis and small left pleural effusion. 3. Aortic atherosclerosis. Electronically Signed   By: Trudie Reed M.D.   On: 01/17/2020 06:29   Portable Chest x-ray  Result Date: 01/16/2020 CLINICAL DATA:  Intubation EXAM: PORTABLE CHEST 1 VIEW COMPARISON:  01/15/2020 FINDINGS: Interval placement of esophagogastric tube, tip and side port below the diaphragm. Interval advancement of endotracheal tube, tip positioned above the carina. Right neck vascular catheter remains in position. Unchanged mild, diffuse interstitial opacity and possible small layering pleural effusions. No new or focal airspace opacity. IMPRESSION: 1. Interval placement of esophagogastric tube, tip and side port below the diaphragm. 2. Interval advancement of endotracheal tube, tip positioned above the carina. Electronically Signed   By: Lauralyn Primes M.D.   On: 01/16/2020 15:54   DG CHEST PORT 1 VIEW  Result Date: 01/15/2020 CLINICAL DATA:  ETT placement. EXAM: PORTABLE CHEST 1 VIEW COMPARISON:  January 14, 2020 FINDINGS: The ETT is in good position. Stable pacemaker. The heart, hila, mediastinum are normal. A right central line is again identified. No pneumothorax. Mild interstitial prominence may represent pulmonary venous congestion. No overt edema. Mild bibasilar atelectasis. No suspicious infiltrates.  IMPRESSION: 1. Support apparatus as above. 2. Suggested mild pulmonary  venous congestion. Electronically Signed   By: Gerome Sam III M.D   On: 01/15/2020 11:24   Portable Chest x-ray  Result Date: 01/14/2020 CLINICAL DATA:  Status post ET tube placement. EXAM: PORTABLE CHEST 1 VIEW COMPARISON:  Multiple x-rays since Feb 15, 2016 FINDINGS: The ETT is in good position terminating in the mid trachea. A right IJ terminates in the central SVC. No pneumothorax. Bilateral calcified granulomas are seen in the lungs. No suspicious nodules or masses. No focal infiltrates. The heart, hila, mediastinum are normal. A stable pacemaker is identified. IMPRESSION: The ETT is in good position. The right IJ is in good position with no pneumothorax. No acute abnormalities. Electronically Signed   By: Gerome Sam III M.D   On: 01/14/2020 11:59   DG Abd Portable 1V  Result Date: 01/16/2020 CLINICAL DATA:  Nasogastric tube placement EXAM: PORTABLE ABDOMEN - 1 VIEW COMPARISON:  CT abdomen and pelvis January 13, 2020 FINDINGS: Nasogastric tube tip and side port in stomach. Stomach appears to contain extensive food material. No bowel dilatation or air-fluid level to suggest bowel obstruction. No free air. Pancreatic stent again noted. Calcified granulomas in visualized portions of lungs. IMPRESSION: Nasogastric tube tip and side port in stomach. No bowel dilatation or free air evident. Extensive apparent food material in stomach. Pancreatic stent present. Electronically Signed   By: Bretta Bang III M.D.   On: 01/16/2020 11:55   DG Swallowing Func-Speech Pathology  Result Date: 01/30/2020 Objective Swallowing Evaluation: Type of Study: MBS-Modified Barium Swallow Study  Patient Details Name: Natasha Jacobson MRN: [id number] Date of Birth: 16-May-1951 Today's Date: 01/30/2020 Time: SLP Start Time (ACUTE ONLY): 1140 -SLP Stop Time (ACUTE ONLY): 1200 SLP Time Calculation (min) (ACUTE ONLY): 20 min Past Medical History: Past Medical History: Diagnosis Date . 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 Alcohol-induced chronic pancreatitis (HCC)  . Brittle bone disease  . Chronic pain syndrome  . COPD (chronic obstructive pulmonary disease) (HCC)  . Hearing loss, right 07/08/2016 . Hepatitis C   Eradicated with antiviral treatment per patient . High cholesterol  . Hypertension  . Hypopotassemia  . Hypothyroidism 07/08/2016 . Prediabetes  . SSS (sick sinus syndrome) (HCC) 09/06/2015 . Thyroid disease  . Vitamin D deficiency  Past Surgical History: Past Surgical History: Procedure Laterality Date . ABDOMINAL HYSTERECTOMY   . APPENDECTOMY   . ERCP    multiple with stents placed  . ESOPHAGOGASTRODUODENOSCOPY N/A 01/17/2020  Procedure: ESOPHAGOGASTRODUODENOSCOPY (EGD);  Surgeon: Lemar Lofty., MD;  Location: [facility] ENDOSCOPY;  Service: Gastroenterology;  Laterality: N/A; . ESOPHAGOGASTRODUODENOSCOPY (EGD) WITH PROPOFOL N/A 01/14/2020  Procedure: ESOPHAGOGASTRODUODENOSCOPY (EGD) WITH PROPOFOL;  Surgeon: Benancio Deeds, MD;  Location: [facility] ENDOSCOPY;  Service: Gastroenterology;  Laterality: N/A; . FOREIGN BODY REMOVAL  01/17/2020  Procedure: FOREIGN BODY REMOVAL;  Surgeon: Meridee Score Netty Starring., MD;  Location: [facility] ENDOSCOPY;  Service: Gastroenterology;;  gastric lavage . IR ANGIOGRAM SELECTIVE EACH ADDITIONAL VESSEL  01/15/2020 . IR ANGIOGRAM VISCERAL SELECTIVE  01/15/2020 . IR EMBO ART  VEN HEMORR LYMPH EXTRAV  INC GUIDE ROADMAPPING  01/15/2020 . IR US GUIDE VASC ACCESS RIGHT  01/15/2020 . PACEMAKER IMPLANT   . TONSILLECTOMY   HPI: Pt is a 69 yo female admitted with hemorrhagic shock 2/2 bleeding 7mm pseudoaneurysm and recent instrumentation s/p EGD and coil embolization by IR 4/10. Course also significant for UTI, acute on chronic pancreatitis, and ETT 4/10-4/11; reintubated for encephalopathy  and hypoxia 4/12-4/16. BSE 4/17 recommended NPO. Pt reintubated 4/19-4/22 due to respiratory distress. PMH also includes: COPD, hearing loss, HTN, thyroid disease.  Subjective:  deconditioned, saying she can't cough, doesn't want to eat/drink Assessment / Plan / Recommendation CHL IP CLINICAL IMPRESSIONS 01/30/2020 Clinical Impression Pt presents as very restless and poorly pariticpatory during MBS. Needed max cues by end of session and instances of oral holding were prevalent throughout. Suspect that pts pain and anxiety impacted her breathing pattern and PO trials contributed to her respiratory fatigue. Pt held bolus for several seconds at time while trying to catch her breath with occasional piecemealing of overlarge boluses. Though there were instances of premature spillage, swallow initaition was timely and strong. Primary problem was mild oral/base of tongue residue that spilled to vestibule and settled on vocal folds post swallow without sensation with large sips of thin liquids. Pt did improve with smaller bolus size via cup, but further strategies were not attempted as pt became increasingly restless. She tolerated nectar well but never swallowed her puree bolus. Recommed initiating a dys 1 (puree)/nectar thick liquid diet with potential to upgrade if endurance improves. Will follow for futher trials.  SLP Visit Diagnosis Dysphagia, unspecified (R13.10) Attention and concentration deficit following -- Frontal lobe and executive function deficit following -- Impact on safety and function Mild aspiration risk   CHL IP TREATMENT RECOMMENDATION 01/30/2020 Treatment Recommendations Therapy as outlined in treatment plan below   Prognosis 01/27/2020 Prognosis for Safe Diet Advancement (No Data) Barriers to Reach Goals Cognitive deficits;Behavior Barriers/Prognosis Comment -- CHL IP DIET RECOMMENDATION 01/30/2020 SLP Diet Recommendations Dysphagia 1 (Puree) solids;Nectar thick liquid Liquid Administration via Cup;Straw Medication Administration Crushed with puree Compensations Clear throat intermittently Postural Changes --   CHL IP OTHER RECOMMENDATIONS 01/27/2020 Recommended Consults -- Oral  Care Recommendations -- Other Recommendations Have oral suction available   CHL IP FOLLOW UP RECOMMENDATIONS 01/30/2020 Follow up Recommendations Skilled Nursing facility   [facility] IP FREQUENCY AND DURATION 01/30/2020 Speech Therapy Frequency (ACUTE ONLY) min 2x/week Treatment Duration 2 weeks      CHL IP ORAL PHASE 01/30/2020 Oral Phase Impaired Oral - Pudding Teaspoon -- Oral - Pudding Cup -- Oral - Honey Teaspoon -- Oral - Honey Cup -- Oral - Nectar Teaspoon -- Oral - Nectar Cup -- Oral - Nectar Straw Delayed oral transit;Holding of bolus;Lingual pumping Oral - Thin Teaspoon -- Oral - Thin Cup Delayed oral transit;Holding of bolus;Lingual pumping Oral - Thin Straw Delayed oral transit;Holding of bolus;Lingual pumping Oral - Puree Delayed oral transit;Holding of bolus;Lingual pumping;Other (Comment) Oral - Mech Soft -- Oral - Regular -- Oral - Multi-Consistency -- Oral - Pill -- Oral Phase - Comment --  CHL IP PHARYNGEAL PHASE 01/30/2020 Pharyngeal Phase Impaired Pharyngeal- Pudding Teaspoon -- Pharyngeal -- Pharyngeal- Pudding Cup -- Pharyngeal -- Pharyngeal- Honey Teaspoon -- Pharyngeal -- Pharyngeal- Honey Cup -- Pharyngeal -- Pharyngeal- Nectar Teaspoon -- Pharyngeal -- Pharyngeal- Nectar Cup -- Pharyngeal -- Pharyngeal- Nectar Straw WFL Pharyngeal -- Pharyngeal- Thin Teaspoon -- Pharyngeal -- Pharyngeal- Thin Cup [facility] Pharyngeal Material enters airway, CONTACTS cords and not ejected out Pharyngeal- Thin Straw Penetration/Apiration after swallow;Trace aspiration Pharyngeal Material enters airway, CONTACTS cords and not ejected out Pharyngeal- Puree -- Pharyngeal -- Pharyngeal- Mechanical Soft -- Pharyngeal -- Pharyngeal- Regular -- Pharyngeal -- Pharyngeal- Multi-consistency -- Pharyngeal -- Pharyngeal- Pill -- Pharyngeal -- Pharyngeal Comment --  No flowsheet data found. Harlon Ditty, MA CCC-SLP Acute Rehabilitation Services Pager [phone] Office [phone] [name] 01/30/2020, 1:56 PM  IR EMBO ART  VEN HEMORR LYMPH EXTRAV  INC GUIDE ROADMAPPING  Result Date: 01/15/2020 CLINICAL DATA:  GI bleed. 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 EXAM: SELECTIVE VISCERAL ARTERIOGRAPHY; ADDITIONAL ARTERIOGRAPHY; IR ULTRASOUND GUIDANCE VASC ACCESS RIGHT; IR EMBO ART VEN HEMORR LYMPH EXTRAV INC GUIDE ROADMAPPING ANESTHESIA/SEDATION: Patient was intubated and receiving IV fentanyl MEDICATIONS: Lidocaine 1% subcutaneous CONTRAST:  Omnipaque 300 IA PROCEDURE: The procedure was performed with implied emergent consent. Right femoral region prepped and draped in usual sterile fashion. Maximal barrier sterile technique was utilized including caps, mask, sterile gowns, sterile gloves, sterile drape, hand hygiene and skin antiseptic. The right common femoral artery was localized under ultrasound. Under real-time ultrasound guidance, the vessel was accessed with a 21-gauge micropuncture needle, exchanged over a 018 guidewire for a transitional dilator, through which a 035 guidewire was advanced. Over this, a 5 [place] vascular sheath was placed, through which a 5 [place] C2 catheter was advanced and used to selectively catheterize the celiac axis for selective arteriography. This was exchanged for a 5 [place] Sos catheter. Through this, a coaxial microcatheter with 016 guidewire were advanced across the lesion in the mid splenic artery. Coil embolization immediately distal to the lesion performed with 2, 3, and 4 mm interlock coils. Separately, 5 mm coils x2 placed in the pseudoaneurysm. Separately, 2, 3, and 4 mm interlock coils were used in the native splenic artery immediately proximal to the lesion. Hemostasis of flow was achieved. Follow-up arteriogram was performed. The catheter was removed. Because of coagulopathy, sheath was secured with 0 silk suture and placed to flush system. The patient tolerated the procedure well. COMPLICATIONS: None immediate FINDINGS: Pseudoaneurysm from the  midportion of the splenic artery, corresponding to site of active extravasation on CTA. Technically successful coil embolization of the splenic artery immediately distal and proximal to the lesion. IMPRESSION: 1. 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 Electronically Signed   By: Corlis Leak M.D.   On: 01/15/2020 09:49   VAS [place] UPPER EXTREMITY VENOUS DUPLEX  Result Date: 01/23/2020 UPPER VENOUS STUDY  Indications: Edema Limitations: Bandages, poor ultrasound/tissue interface and edema. Comparison Study: No prior study on file for comparison Performing Technologist: Sherren Kerns RVS  Examination Guidelines: A complete evaluation includes B-mode imaging, spectral Doppler, color Doppler, and power Doppler as needed of all accessible portions of each vessel. Bilateral testing is considered an integral part of a complete examination. Limited examinations for reoccurring indications may be performed as noted.  Right Findings: +----------+------------+---------+-----------+----------+-----------------+ RIGHT     CompressiblePhasicitySpontaneousProperties     Summary      +----------+------------+---------+-----------+----------+-----------------+ IJV                                                  Not visualized   +----------+------------+---------+-----------+----------+-----------------+ Subclavian    Full       Yes       Yes                                +----------+------------+---------+-----------+----------+-----------------+ Axillary      Full       Yes       Yes                                +----------+------------+---------+-----------+----------+-----------------+  Brachial      Full       Yes       Yes                                +----------+------------+---------+-----------+----------+-----------------+ Cephalic      None                                        Acute        +----------+------------+---------+-----------+----------+-----------------+ Basilic       None                                  Age Indeterminate +----------+------------+---------+-----------+----------+-----------------+  Left Findings: +----------+------------+---------+-----------+----------+-------+ LEFT      CompressiblePhasicitySpontaneousPropertiesSummary +----------+------------+---------+-----------+----------+-------+ Subclavian    Full       Yes       Yes                      +----------+------------+---------+-----------+----------+-------+  Summary:  Right: No evidence of deep vein thrombosis in the upper extremity. Findings consistent with acute superficial vein thrombosis involving the right cephalic vein. Findings consistent with age indeterminate superficial vein thrombosis involving the right basilic vein.  Left: No evidence of thrombosis in the subclavian.  *See table(s) above for measurements and observations.  Diagnosing physician: Fabienne Bruns MD Electronically signed by Fabienne Bruns MD on 01/23/2020 at 7:32:31 PM.    Final     Labs:  CBC: Recent Labs    01/31/20 0513 02/01/20 0500 02/02/20 0627 02/05/20 0500  WBC 44.7* 35.8* 32.0* 35.3*  HGB 8.8* 8.8* 8.4* 7.9*  HCT 28.6* 27.4* 26.9* 25.3*  PLT 263 377 401* 622*    COAGS: Recent Labs    01/14/20 1042 01/15/20 1013 01/17/20 0439 02/06/20 1721  INR 3.5* 1.5* 1.5* 1.2    BMP: Recent Labs    01/31/20 0513 02/01/20 0500 02/02/20 0627 02/05/20 0500  NA 142 141 137 137  K 4.5 3.4* 4.2 4.1  CL 119* 114* 112* 108  CO2 15* 19* 18* 23  GLUCOSE 93 142* 131* 142*  BUN 42* 37* 32* 37*  CALCIUM 7.3* 7.8* 7.5* 7.7*  CREATININE 1.23* 1.17* 1.08* 1.06*  GFRNONAA 46* 49* 53* 54*  GFRAA 53* 56* >60 >60    LIVER FUNCTION TESTS: Recent Labs    01/25/20 0445 01/25/20 0445 01/26/20 0430 01/30/20 1350 02/02/20 0627 02/05/20 0500  BILITOT 0.7  --  0.8  --  0.4 0.3  AST 118*  --  96*  --   24 19  ALT 76*  --  85*  --  21 20  ALKPHOS 92  --  86  --  82 90  PROT 4.6*  --  4.3*  --  4.1* 4.2*  ALBUMIN 1.3*   < > 1.2* 1.2* 1.1* <1.0*   < > = values in this interval not displayed.    TUMOR MARKERS: No results for input(s): AFPTM, CEA, CA199, CHROMGRNA in the last 8760 hours.  Assessment and Plan:  Splenic bed abscess For drain placement in IR today Risks and benefits discussed with the patient including bleeding, infection, damage to adjacent structures, bowel perforation/fistula connection, and sepsis.  All of the patient's questions were answered, patient is agreeable to proceed. Consent signed and in  chart.   Thank you for this interesting consult.  I greatly enjoyed meeting Natasha Jacobson and look forward to participating in their care.  A copy of this report was sent to the requesting provider on this date.  Electronically Signed: Lavonia Drafts, PA-C 02/07/2020, 8:54 AM   I spent a total of 40 Minutes    in face to face in clinical consultation, greater than 50% of which was counseling/coordinating care for splenic bed abscess drain placement

## 2020-02-07 NOTE — Progress Notes (Signed)
Back from IR by bed awake and alert.. drain to left lat .side intact. Connected to bulb with bloody output.

## 2020-02-08 DIAGNOSIS — K625 Hemorrhage of anus and rectum: Secondary | ICD-10-CM

## 2020-02-08 DIAGNOSIS — K651 Peritoneal abscess: Secondary | ICD-10-CM | POA: Diagnosis not present

## 2020-02-08 LAB — PREPARE RBC (CROSSMATCH)

## 2020-02-08 LAB — GLUCOSE, CAPILLARY
Glucose-Capillary: 111 mg/dL — ABNORMAL HIGH (ref 70–99)
Glucose-Capillary: 117 mg/dL — ABNORMAL HIGH (ref 70–99)
Glucose-Capillary: 130 mg/dL — ABNORMAL HIGH (ref 70–99)
Glucose-Capillary: 148 mg/dL — ABNORMAL HIGH (ref 70–99)
Glucose-Capillary: 94 mg/dL (ref 70–99)

## 2020-02-08 LAB — COMPREHENSIVE METABOLIC PANEL
ALT: 21 U/L (ref 0–44)
AST: 20 U/L (ref 15–41)
Albumin: 1.1 g/dL — ABNORMAL LOW (ref 3.5–5.0)
Alkaline Phosphatase: 86 U/L (ref 38–126)
Anion gap: 8 (ref 5–15)
BUN: 40 mg/dL — ABNORMAL HIGH (ref 8–23)
CO2: 25 mmol/L (ref 22–32)
Calcium: 7.6 mg/dL — ABNORMAL LOW (ref 8.9–10.3)
Chloride: 105 mmol/L (ref 98–111)
Creatinine, Ser: 0.88 mg/dL (ref 0.44–1.00)
GFR calc Af Amer: >60 mL/min (ref >60)
GFR calc non Af Amer: >60 mL/min (ref >60)
Glucose, Bld: 158 mg/dL — ABNORMAL HIGH (ref 70–99)
Potassium: 4 mmol/L (ref 3.5–5.1)
Sodium: 138 mmol/L (ref 135–145)
Total Bilirubin: 0.3 mg/dL (ref 0.3–1.2)
Total Protein: 4.3 g/dL — ABNORMAL LOW (ref 6.5–8.1)

## 2020-02-08 LAB — CBC
HCT: 22.3 % — ABNORMAL LOW (ref 36.0–46.0)
HCT: 27.1 % — ABNORMAL LOW (ref 36.0–46.0)
Hemoglobin: 6.8 g/dL — CL (ref 12.0–15.0)
Hemoglobin: 8.6 g/dL — ABNORMAL LOW (ref 12.0–15.0)
MCH: 30 pg (ref 26.0–34.0)
MCH: 30.5 pg (ref 26.0–34.0)
MCHC: 30.5 g/dL (ref 30.0–36.0)
MCHC: 31.7 g/dL (ref 30.0–36.0)
MCV: 98.2 fL (ref 80.0–100.0)
MCV: 96.1 fL (ref 80.0–100.0)
Platelets: 687 10*3/uL — ABNORMAL HIGH (ref 150–400)
Platelets: 672 10*3/uL — ABNORMAL HIGH (ref 150–400)
RBC: 2.27 MIL/uL — ABNORMAL LOW (ref 3.87–5.11)
RBC: 2.82 MIL/uL — ABNORMAL LOW (ref 3.87–5.11)
RDW: 16.7 % — ABNORMAL HIGH (ref 11.5–15.5)
RDW: 15.9 % — ABNORMAL HIGH (ref 11.5–15.5)
WBC: 22.6 10*3/uL — ABNORMAL HIGH (ref 4.0–10.5)
WBC: 23.5 10*3/uL — ABNORMAL HIGH (ref 4.0–10.5)
nRBC: 0.5 % — ABNORMAL HIGH (ref 0.0–0.2)
nRBC: 0.3 % — ABNORMAL HIGH (ref 0.0–0.2)

## 2020-02-08 LAB — MAGNESIUM: Magnesium: 1.5 mg/dL — ABNORMAL LOW (ref 1.7–2.4)

## 2020-02-08 LAB — OCCULT BLOOD X 1 CARD TO LAB, STOOL: Fecal Occult Bld: POSITIVE — AB

## 2020-02-08 LAB — PHOSPHORUS: Phosphorus: 3.1 mg/dL (ref 2.5–4.6)

## 2020-02-08 MED ORDER — SODIUM CHLORIDE 0.9% IV SOLUTION: Freq: Once | INTRAVENOUS | Status: DC

## 2020-02-08 MED ORDER — OXYCODONE HCL 5 MG PO TABS
10.0000 mg | ORAL_TABLET | Freq: Four times a day (QID) | ORAL | Status: DC | PRN
  Administered 2020-02-08 – 2020-02-09 (×2): 10 mg
  Filled 2020-02-08 (×3): qty 2

## 2020-02-08 MED ORDER — SALINE SPRAY 0.65 % NA SOLN
1.0000 | NASAL | Status: DC | PRN
  Administered 2020-02-08: 1 via NASAL
  Filled 2020-02-08: qty 44

## 2020-02-08 MED ORDER — MAGNESIUM SULFATE 2 GM/50ML IV SOLN
2.0000 g | Freq: Once | INTRAVENOUS | Status: AC
  Administered 2020-02-08: 2 g via INTRAVENOUS
  Filled 2020-02-08: qty 50

## 2020-02-08 NOTE — Progress Notes (Signed)
CRITICAL VALUE ALERT   Critical Value:  Hemoglobin 6.8  Date & Time Notied: 02/08/2020 at 0628  Provider Notified: T. Opyd  Orders Received/Actions taken: pending

## 2020-02-08 NOTE — Plan of Care (Signed)
  Problem: Education: Goal: Knowledge of General Education information will improve Description: Including pain rating scale, medication(s)/side effects and non-pharmacologic comfort measures Outcome: Progressing   Problem: Health Behavior/Discharge Planning: Goal: Ability to manage health-related needs will improve Outcome: Progressing   Problem: Clinical Measurements: Goal: Ability to maintain clinical measurements within normal limits will improve Outcome: Progressing Goal: Will remain free from infection Outcome: Progressing Goal: Diagnostic test results will improve Outcome: Progressing Goal: Respiratory complications will improve Outcome: Progressing Goal: Cardiovascular complication will be avoided Outcome: Progressing   Problem: Activity: Goal: Risk for activity intolerance will decrease Outcome: Progressing   Problem: Nutrition: Goal: Adequate nutrition will be maintained Outcome: Progressing   Problem: Coping: Goal: Level of anxiety will decrease Outcome: Progressing   Problem: Elimination: Goal: Will not experience complications related to bowel motility Outcome: Progressing Goal: Will not experience complications related to urinary retention Outcome: Progressing   Problem: Pain Managment: Goal: General experience of comfort will improve Outcome: Progressing   Problem: Safety: Goal: Ability to remain free from injury will improve Outcome: Progressing   Problem: Skin Integrity: Goal: Risk for impaired skin integrity will decrease Outcome: Progressing   Problem: Activity: Goal: Ability to tolerate increased activity will improve Outcome: Progressing   Problem: Respiratory: Goal: Ability to maintain a clear airway and adequate ventilation will improve Outcome: Progressing   Problem: Role Relationship: Goal: Method of communication will improve Outcome: Progressing   Problem: Education: Goal: Ability to identify signs and symptoms of  gastrointestinal bleeding will improve Outcome: Progressing   Problem: Bowel/Gastric: Goal: Will show no signs and symptoms of gastrointestinal bleeding Outcome: Progressing   Problem: Fluid Volume: Goal: Will show no signs and symptoms of excessive bleeding Outcome: Progressing   Problem: Clinical Measurements: Goal: Complications related to the disease process, condition or treatment will be avoided or minimized Outcome: Progressing   

## 2020-02-08 NOTE — Progress Notes (Signed)
Call to Dr Hanley Ben informing him of Red colored stools, RN obtained hemoccult card and sent to lab.  Lab called to draw CBC as midline is not pulling back blood.  No new orders at this time.  MD to consult GI and Pallative

## 2020-02-08 NOTE — Progress Notes (Addendum)
Daily Rounding Note  02/08/2020, 2:18 PM  LOS: 26 days   SUBJECTIVE:   Chief complaint:  Bloody stool in flexi-seal today.  Hospital day 25.  Please see prior GI consult and progress notes dated 4/10 -4/14. Patient with chronic alcoholic pancreatitis, pancreatic duct strictures.  Advanced endoscopy interventions at [facility]. Previous pancreatic duct sphincterotomy.   EUS/ERCP 1/22 with Dr. Edyth Gunnels at [facility].  Patent sphincterotomy, chronic pancreatitis, stents placed to PD.  01/12/2020 ERCP:  open biliary and pancreatic sphinterotomies w removal of occluded plastic stent, dilation of pancreatic duct and new plastic stent (#1) placed to PD Hep C, cured (not clear what/if treatment) w undetectable virus in 12/2018.    During this admission GI advised and intervened regarding blood loss anemia, melena.  Other issues include chronic abdominal pain, chronic pancreatitis, aspiration pneumonia/hypoxic respiratory failure/respiratory cultures growing MSSA and Enterobacter., AKI, severe malnutrition, encephalopathy/acute delirium.   01/14/2020 EGD: Red blood, clots filling entire stomach, procedure terminated. 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 01/17/2020 EGD: Mild, nonbleeding esophagitis.  Small hiatal hernia.  Nonbleeding, clean-based gastric ulcer.  Congested, discolored gastric mucosa with decreased vascular pattern, ?  Evolving ischemia.  Non-bleeding erosive gastropathy.  45 minutes of lavage and suctioning of clotted blood in the stomach.  Pancreatico- gastrostomy site with inflammation and double-pigtail stent in place without bleeding.  Blood at ampulla, duodenal bulb and second duodenum cleared with suctioning. On 5/4 patient underwent IR drain placement of abscess in the splenic bed. Planned for pt to follow-up with Dr. Edyth Gunnels at [facility].    Flexi-Seal has been in place for 15 days.  Stool this AM turned medium  red in the Flexi-Seal bag. No change in soft, liquid consistency.   Hgb has drifted from 8.8 >> 6.8 over the last 9 days, it was 7.9 yesterday. Repeat cbc about to get drawn. Platelets 687.  INR 3 days ago was 1.2.  WBC still elevated at 22.6 but this is down from 44.7 ten days ago.   Received 1 PRBC this AM.   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   Do not find documented colonoscopy in the chart.    OBJECTIVE:         Vital signs in last 24 hours:    Temp:  [97.5 F (36.4 C)-98.6 F (37 C)] 97.7 F (36.5 C) (05/05 1130) Pulse Rate:  [79-90] 82 (05/05 1041) Resp:  [18-24] 19 (05/05 1130) BP: (86-134)/(58-85) 105/70 (05/05 1130) SpO2:  [98 %-100 %] 100 % (05/05 1130) Weight:  [61.2 kg] 61.2 kg (05/05 0419) Last BM Date: (P) (flexiseal) Filed Weights   02/06/20 0624 02/07/20 0340 02/08/20 0419  Weight: 64.7 kg 60.4 kg 61.2 kg   General: chronically ill looking, comfortable, alert.  coretrack FT in place  Heart: RRR Chest: clear bil.  No labored breathing Abdomen: soft, ND.  BS hypoactive.  Tender diffusely, everywhere, to light pressure.  No G or R. Pasty/liquid pinkish/red stool in flexiseal,  Blood completely mixed into the stool.   Extremities: thin limbs.  No edeme Neuro/Psych:  Alert, appropriate, no tremors, moves all 4 limbs.   Intake/Output from previous day: 05/04 0701 - 05/05 0700 In: 1384.9 [P.O.:120; NG/GT:1210; IV Piggyback:49.9] Out: 280 [Drains:280]  Intake/Output this shift: Total I/O In: 372.5 [I.V.:50; Blood:322.5] Out: -   Lab Results:  Recent Labs    02/08/20 0500  WBC 22.6*  HGB 6.8*  HCT 22.3*  PLT 687*   BMET Recent Labs    02/08/20 0500  NA 138  K 4.0  CL 105  CO2 25  GLUCOSE 158*  BUN 40*  CREATININE 0.88  CALCIUM 7.6*   LFT Recent Labs    02/08/20 0500  PROT 4.3*  ALBUMIN 1.1*  AST 20  ALT 21  ALKPHOS 86   BILITOT 0.3   PT/INR Recent Labs    02/06/20 1721  LABPROT 14.9  INR 1.2   Hepatitis Panel No results for input(s): HEPBSAG, HCVAB, HEPAIGM, HEPBIGM in the last 72 hours.  Studies/Results: CT SOFT TISSUE NECK WO CONTRAST  Result Date: 02/07/2020 CLINICAL DATA:  69 year old female with diffuse neck pain without known injury. Bilateral pain. Suspected splenic abscess on CT Abdomen and Pelvis yesterday. EXAM: CT NECK WITHOUT CONTRAST TECHNIQUE: Multidetector CT imaging of the neck was performed following the standard protocol without intravenous contrast. COMPARISON:  Head CT 01/19/2020. FINDINGS: Pharynx and larynx: The glottis is closed. Otherwise negative noncontrast larynx. Right side nasal enteric tube is in place and courses into the esophagus. Otherwise negative noncontrast pharynx. Negative parapharyngeal and retropharyngeal spaces. Salivary glands: Negative sublingual space. Noncontrast submandibular glands and parotid glands appear symmetric and negative. Thyroid: Diminutive, negative. Lymph nodes: No cervical lymphadenopathy is identified. Vascular: Vascular patency is not evaluated in the absence of IV contrast. Scattered calcified atherosclerosis. Limited intracranial: Stable, negative. Visualized orbits: Negative. Mastoids and visualized paranasal sinuses: Right side nasoenteric tube. Mild mucosal thickening in the paranasal sinuses has regressed since last month. Mild mastoid effusions are stable. Tympanic cavities appear clear. Skeleton: Largely absent dentition. Cervical spine facet degeneration. Severe left shoulder osseous degeneration. No acute or suspicious osseous lesion identified. Upper chest: Enteric tube continues in the thoracic esophagus which appears unremarkable. Left chest cardiac pacemaker type device. Calcified aortic atherosclerosis. No superior mediastinal lymphadenopathy. Other: Centrilobular emphysema. Small to moderate left and small right layering pleural  effusions. Partially visible calcified granulomas in the right lung. No axillary lymphadenopathy. IMPRESSION: 1. Negative noncontrast CT appearance of the Neck aside from calcified carotid atherosclerosis. 2. Partially visible bilateral pleural effusions and Emphysema (ICD10-J43.9). 3. Right side nasoenteric tube in place, partially visible with no adverse features. 4. Aortic Atherosclerosis (ICD10-I70.0). Electronically Signed   By: Odessa Fleming M.D.   On: 02/07/2020 11:30   CT IMAGE GUIDED DRAINAGE PERCUT CATH  PERITONEAL RETROPERIT  Result Date: 02/07/2020 INDICATION: Splenectomy bed abscess by CT EXAM: CT drainage left upper quadrant splenectomy bed drain placement MEDICATIONS: The patient is currently admitted to the hospital and receiving intravenous antibiotics. The antibiotics were administered within an appropriate time frame prior to the initiation of the procedure. ANESTHESIA/SEDATION: Fentanyl 50 mcg IV; Versed 0.5 mg IV Moderate Sedation Time:  19 minutes The patient was continuously monitored during the procedure by the interventional radiology nurse under my direct supervision. COMPLICATIONS: None immediate. PROCEDURE: Informed written consent was obtained from the patient after a thorough discussion of the procedural risks, benefits and alternatives. All questions were addressed. Maximal Sterile Barrier Technique was utilized including caps, mask, sterile gowns, sterile gloves, sterile drape, hand hygiene and skin antiseptic. A timeout was performed prior to the initiation of the procedure. Previous imaging reviewed. Patient positioned left anterior oblique. Noncontrast localization CT performed. The left upper quadrant splenic bed abscess was localized and marked. Under sterile conditions and local anesthesia, an 18 gauge 10 cm access was advanced into the  fluid collection. Needle position confirmed with CT. Syringe aspiration yielded bloody exudative malodorous fluid compatible with infected  hematoma. Sample sent for culture. Guidewire inserted followed by tract dilatation to insert a 12 [place] drain. Drain catheter position confirmed with CT. Catheter secured with a prolene suture and connected to external suction bulb. Sterile dressing applied. No immediate complication. Patient tolerated the procedure well. IMPRESSION: Successful CT-guided left upper quadrant splenic bed abscess drain placement as above. Electronically Signed   By: Judie Petit.  Shick M.D.   On: 02/07/2020 12:18   Scheduled Meds: . sodium chloride   Intravenous Once  . acetaminophen (TYLENOL) oral liquid 160 mg/5 mL  650 mg Per Tube Q6H  . Chlorhexidine Gluconate Cloth  6 each Topical Daily  . feeding supplement (ENSURE ENLIVE)  237 mL Oral TID BM  . feeding supplement (PRO-STAT SUGAR FREE 64)  30 mL Per Tube Daily  . feeding supplement (VITAL 1.5 CAL)  840 mL Per Tube Q24H  . free water  150 mL Per Tube Q4H  . geriatric multivitamins-minerals  15 mL Per Tube Daily  . HYDROmorphone HCl  24 mg Oral Q24H  . insulin aspart  0-6 Units Subcutaneous Q4H  . levothyroxine  50 mcg Per Tube Q0600  . lipase/protease/amylase  12,000 Units Oral TID AC  . magic mouthwash  5 mL Oral QID  . melatonin  3 mg Oral QHS  . pantoprazole sodium  40 mg Per Tube BID  . potassium chloride  40 mEq Oral Once  . pregabalin  75 mg Oral BID  . sodium chloride flush  10-40 mL Intracatheter Q12H  . sodium chloride flush  5 mL Intracatheter Q8H  . thiamine  100 mg Per Tube Daily  . traZODone  50 mg Oral QHS   Continuous Infusions: . magnesium sulfate bolus IVPB    . piperacillin-tazobactam (ZOSYN)  IV 3.375 g (02/08/20 1416)   PRN Meds:.albuterol, Gerhardt's butt cream, guaiFENesin-dextromethorphan, HYDROmorphone, ipratropium-albuterol, loperamide HCl, ondansetron (ZOFRAN) IV, oxyCODONE, Resource ThickenUp Clear, sodium chloride flush   ASSESMENT:   *  New onset rectal bleeding.  flexiseal in place for 15 days so a pressure ulcer may have  developed.  ? UGI source of bleeding?   *    S/p drainage of abscess in splenic bed.  *    Chronic calcific pancreatitis, multiple interventions at [facility]  Most recent ERCP (Endoscopicretrogradecholangiopancreatography) 01/12/2020 with removal of occluded plastic stent, dilation of pancreatic duct and placement of new plastic pancreatic duct stent.  Biliary and pancreatic sphincterotomies patent.  *   Upper GI bleed at admission 25 d ago. 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  Blood at ampulla, duodenal bulb and second duodenum cleared with suctioning.  Pancreatico gastrostomy site inflamed but not bleeding, double pigtail stent in position Ongoing Protonix 40 mg bid via tube,  *   Blood loss anemia.  S/p 11 PRBCs, latest was today.   Several units FFP on 4/10.    *   Splenic bed abscess.   02/07/2020 CT-guided drain placement to splenic bed abscess Zosyn in place. Persistent WBC elevation  *   Malnutrition.  Cortrak feeding tube, Vital 1.5 tube feeds in place.  Marland Kitchen      PLAN   *   Remove the flexiseal, ok to place rectal pouch.   Repeat Hgb  pndg.   Consider flex sig if persistent bleeding beyond 48 hours.     Azucena Freed  02/08/2020, 2:18 PM Phone [phone]

## 2020-02-08 NOTE — Plan of Care (Signed)
  Problem: Education: Goal: Knowledge of General Education information will improve Description: Including pain rating scale, medication(s)/side effects and non-pharmacologic comfort measures Outcome: Progressing   Problem: Health Behavior/Discharge Planning: Goal: Ability to manage health-related needs will improve Outcome: Progressing   Problem: Clinical Measurements: Goal: Ability to maintain clinical measurements within normal limits will improve Outcome: Progressing Goal: Will remain free from infection Outcome: Progressing Goal: Diagnostic test results will improve Outcome: Progressing   Problem: Health Behavior/Discharge Planning: Goal: Ability to manage health-related needs will improve Outcome: Progressing

## 2020-02-08 NOTE — Progress Notes (Signed)
OT Cancellation Note  Patient Details Name: Natasha Jacobson MRN: [id number] DOB: May 21, 1951   Cancelled Treatment:    Reason Eval/Treat Not Completed: Patient declined, no reason specified(Pt refusing at this time.) PT/OT attempting to see pt, but pt refusing bed mobility and any EOB activity. Pt's brief was soiled, but pt refusing to move at this time;  RN unable to change pt's brief either. Pt calling daughter stating that "they are trying to get me to move and I can finally get myself to sleep." OT/PT describing the benefits of movement, but pt continues to refuse. Pt to change to 1x weekly as pt has been refusing multiple times.    Flora Lipps, OTR/L Acute Rehabilitation Services Pager: [phone] Office: [phone]  Flora Lipps 02/08/2020, 1:02 PM

## 2020-02-08 NOTE — Progress Notes (Signed)
PT Cancellation Note  Patient Details Name: Natasha Jacobson MRN: [id number] DOB: October 27, 1950   Cancelled Treatment:    Reason Eval/Treat Not Completed: Patient declined, no reason specified; patient just s/p transfusion per nursing.  She had just refused nursing to change her brief.  Attempted with OT for co-tx for up to chair or EOB activity.  Patient adamantly refusing reports able to sleep for the first time.  Called her daughter to complain and denied any attempts of encouragement.  Will attempt another day.   Elray Mcgregor 02/08/2020, 11:58 AM  Sheran Lawless, PT Acute Rehabilitation Services [phone] 02/08/2020

## 2020-02-08 NOTE — Progress Notes (Signed)
Referring Physician(s): Costin Gerghe  Supervising Physician: Oley Balm  Patient Status:  [facility] - In-pt  Chief Complaint:  LUQ abscess  Brief History:  Recent pancreatico gastrostomy stent [place] 01/12/20  Was seen in IR for emergent splenic artery pseudoaneurysm GI bleed--- Embolization 01/14/20  CT on 5/3 showed a 10.5 x 6.6 cm collection of fluid and gas concerning for Abscess.  She underwent placement of a LUQ drain yesterday by Dr. Miles Costain.  Subjective:  Patient lying in bed. Nurse at bedside. No complaints re: the drain.  Allergies: Buprenorphine and Aspirin  Medications: Prior to Admission medications   Medication Sig Start Date End Date Taking? Authorizing Provider  acetaminophen (TYLENOL) 500 MG tablet Take 2 tablets by mouth every 8 (eight) hours as needed. 11/01/19   [provider]  albuterol (VENTOLIN HFA) 108 (90 Base) MCG/ACT inhaler Inhale 2 puffs into the lungs every 4 (four) hours as needed. 11/24/18   [provider]  amLODipine (NORVASC) 5 MG tablet Take 1 tablet by mouth daily. 09/22/19   [provider]  baclofen (LIORESAL) 10 MG tablet Take 1 tablet by mouth daily as needed. 11/11/19   [provider]  ergocalciferol (VITAMIN D2) 1.25 MG (50000 UT) capsule Take 1 capsule by mouth once a week. 11/18/19   [provider]  fenofibrate (TRICOR) 48 MG tablet Take 1 tablet by mouth daily. 05/13/18   [provider]  gabapentin (NEURONTIN) 300 MG capsule Take 1 capsule by mouth 3 (three) times daily. 11/11/19 11/10/20  [provider]  levothyroxine (SYNTHROID) 50 MCG tablet Take 50 mcg by mouth daily before breakfast.    [provider]  Melatonin 5 MG CAPS Take 2 capsules by mouth at bedtime.    [provider]  Oxycodone HCl 10 MG TABS Take 10 mg by mouth every 6 (six) hours as needed for pain. 01/12/20   [provider]  Pancrelipase, Lip-Prot-Amyl, (CREON) 24000-76000  units CPEP Take 1 capsule by mouth 3 (three) times daily. 08/18/19   [provider]  pantoprazole (PROTONIX) 40 MG tablet Take 40 mg by mouth daily.    [provider]  polyethylene glycol powder (GLYCOLAX/MIRALAX) 17 GM/SCOOP powder Take 17 g by mouth as needed.    [provider]  senna (SENOKOT) 8.6 MG TABS tablet Take 1 tablet by mouth as needed for mild constipation.    [provider]  Tiotropium Bromide Monohydrate (SPIRIVA RESPIMAT) 1.25 MCG/ACT AERS Inhale 2 puffs into the lungs daily. 11/15/19 11/14/20  [provider]     Vital Signs: BP (!) 106/58   Pulse 83   Temp 98.2 F (36.8 C) (Oral)   Resp (!) 24   Ht 5\' 4"  (1.626 m)   Wt 61.2 kg   SpO2 100%   BMI 23.16 kg/m   Physical Exam Vitals reviewed.  Constitutional:      Appearance: Normal appearance.  Cardiovascular:     Rate and Rhythm: Normal rate.  Pulmonary:     Effort: Pulmonary effort is normal. No respiratory distress.  Abdominal:     Palpations: Abdomen is soft.     Comments: Drain LUQ, thin brown output. ~280 mL  Neurological:     General: No focal deficit present.     Mental Status: She is alert and oriented to person, place, and time.  Psychiatric:        Mood and Affect: Mood normal.        Behavior: Behavior normal.  Thought Content: Thought content normal.        Judgment: Judgment normal.     Imaging: CT SOFT TISSUE NECK WO CONTRAST  Result Date: 02/07/2020 CLINICAL DATA:  69 year old female with diffuse neck pain without known injury. Bilateral pain. Suspected splenic abscess on CT Abdomen and Pelvis yesterday. EXAM: CT NECK WITHOUT CONTRAST TECHNIQUE: Multidetector CT imaging of the neck was performed following the standard protocol without intravenous contrast. COMPARISON:  Head CT 01/19/2020. FINDINGS: Pharynx and larynx: The glottis is closed. Otherwise negative noncontrast larynx. Right side nasal enteric tube is in place and courses into the  esophagus. Otherwise negative noncontrast pharynx. Negative parapharyngeal and retropharyngeal spaces. Salivary glands: Negative sublingual space. Noncontrast submandibular glands and parotid glands appear symmetric and negative. Thyroid: Diminutive, negative. Lymph nodes: No cervical lymphadenopathy is identified. Vascular: Vascular patency is not evaluated in the absence of IV contrast. Scattered calcified atherosclerosis. Limited intracranial: Stable, negative. Visualized orbits: Negative. Mastoids and visualized paranasal sinuses: Right side nasoenteric tube. Mild mucosal thickening in the paranasal sinuses has regressed since last month. Mild mastoid effusions are stable. Tympanic cavities appear clear. Skeleton: Largely absent dentition. Cervical spine facet degeneration. Severe left shoulder osseous degeneration. No acute or suspicious osseous lesion identified. Upper chest: Enteric tube continues in the thoracic esophagus which appears unremarkable. Left chest cardiac pacemaker type device. Calcified aortic atherosclerosis. No superior mediastinal lymphadenopathy. Other: Centrilobular emphysema. Small to moderate left and small right layering pleural effusions. Partially visible calcified granulomas in the right lung. No axillary lymphadenopathy. IMPRESSION: 1. Negative noncontrast CT appearance of the Neck aside from calcified carotid atherosclerosis. 2. Partially visible bilateral pleural effusions and Emphysema (ICD10-J43.9). 3. Right side nasoenteric tube in place, partially visible with no adverse features. 4. Aortic Atherosclerosis (ICD10-I70.0). Electronically Signed   By: Genevie Ann M.D.   On: 02/07/2020 11:30   CT ABDOMEN PELVIS W CONTRAST  Result Date: 02/06/2020 CLINICAL DATA:  Abdominal abscess. EXAM: CT ABDOMEN AND PELVIS WITH CONTRAST TECHNIQUE: Multidetector CT imaging of the abdomen and pelvis was performed using the standard protocol following bolus administration of intravenous contrast.  CONTRAST:  163mL OMNIPAQUE IOHEXOL 300 MG/ML  SOLN COMPARISON:  January 28, 2020. FINDINGS: Lower chest: Moderate size bilateral pleural effusions are noted with adjacent subsegmental atelectasis. Hepatobiliary: No gallstones are noted. Mild intrahepatic and extrahepatic biliary dilatation is noted. The liver is unremarkable. Pancreas: Continued presence of pancreatic calcifications consistent with chronic pancreatitis. Pancreatic duct stent is noted and unchanged in position. Spleen: The spleen is not clearly visualized, but in its place is a 10.5 x 6.6 cm collection of fluid and gas concerning for abscess. Adrenals/Urinary Tract: Adrenal glands appear normal. Mild bilateral hydronephrosis is noted without definite ureteral dilatation or ureteral obstruction. No definite nephrolithiasis is noted. Urinary bladder is unremarkable. Stomach/Bowel: Status post appendectomy. Feeding tube is seen entering and passing through stomach and duodenum with distal tip in the expected position of the proximal jejunum. Stomach is otherwise unremarkable. Stool and fluid is noted throughout the colon, but no bowel obstruction is noted. Rectal tube is noted. Vascular/Lymphatic: Aortic atherosclerosis. No enlarged abdominal or pelvic lymph nodes. Reproductive: Status post hysterectomy. No adnexal masses. Other: Moderate anasarca is noted. Minimal free fluid is noted in the pelvis. Musculoskeletal: No acute or significant osseous findings. IMPRESSION: Spleen is not clearly visualized, but in the splenic bed there is noted a 10.5 x 6.6 cm collection of fluid and gas concerning for abscess. Moderate size bilateral pleural effusions are noted with adjacent subsegmental atelectasis. Continued presence  of pancreatic calcifications consistent with chronic pancreatitis. Pancreatic duct stent is noted and unchanged in position. Moderate anasarca is noted. Mild bilateral hydronephrosis is noted without definite ureteral dilatation or ureteral  obstruction. Aortic Atherosclerosis (ICD10-I70.0). Electronically Signed   By: Lupita Raider M.D.   On: 02/06/2020 14:15   CT IMAGE GUIDED DRAINAGE PERCUT CATH  PERITONEAL RETROPERIT  Result Date: 02/07/2020 INDICATION: Splenectomy bed abscess by CT EXAM: CT drainage left upper quadrant splenectomy bed drain placement MEDICATIONS: The patient is currently admitted to the hospital and receiving intravenous antibiotics. The antibiotics were administered within an appropriate time frame prior to the initiation of the procedure. ANESTHESIA/SEDATION: Fentanyl 50 mcg IV; Versed 0.5 mg IV Moderate Sedation Time:  19 minutes The patient was continuously monitored during the procedure by the interventional radiology nurse under my direct supervision. COMPLICATIONS: None immediate. PROCEDURE: Informed written consent was obtained from the patient after a thorough discussion of the procedural risks, benefits and alternatives. All questions were addressed. Maximal Sterile Barrier Technique was utilized including caps, mask, sterile gowns, sterile gloves, sterile drape, hand hygiene and skin antiseptic. A timeout was performed prior to the initiation of the procedure. Previous imaging reviewed. Patient positioned left anterior oblique. Noncontrast localization CT performed. The left upper quadrant splenic bed abscess was localized and marked. Under sterile conditions and local anesthesia, an 18 gauge 10 cm access was advanced into the fluid collection. Needle position confirmed with CT. Syringe aspiration yielded bloody exudative malodorous fluid compatible with infected hematoma. Sample sent for culture. Guidewire inserted followed by tract dilatation to insert a 12 [place] drain. Drain catheter position confirmed with CT. Catheter secured with a prolene suture and connected to external suction bulb. Sterile dressing applied. No immediate complication. Patient tolerated the procedure well. IMPRESSION: Successful CT-guided  left upper quadrant splenic bed abscess drain placement as above. Electronically Signed   By: Judie Petit.  Shick M.D.   On: 02/07/2020 12:18    Labs:  CBC: Recent Labs    02/01/20 0500 02/02/20 0627 02/05/20 0500 02/08/20 0500  WBC 35.8* 32.0* 35.3* 22.6*  HGB 8.8* 8.4* 7.9* 6.8*  HCT 27.4* 26.9* 25.3* 22.3*  PLT 377 401* 622* 687*    COAGS: Recent Labs    01/14/20 1042 01/15/20 1013 01/17/20 0439 02/06/20 1721  INR 3.5* 1.5* 1.5* 1.2    BMP: Recent Labs    02/01/20 0500 02/02/20 0627 02/05/20 0500 02/08/20 0500  NA 141 137 137 138  K 3.4* 4.2 4.1 4.0  CL 114* 112* 108 105  CO2 19* 18* 23 25  GLUCOSE 142* 131* 142* 158*  BUN 37* 32* 37* 40*  CALCIUM 7.8* 7.5* 7.7* 7.6*  CREATININE 1.17* 1.08* 1.06* 0.88  GFRNONAA 49* 53* 54* >60  GFRAA 56* >60 >60 >60    LIVER FUNCTION TESTS: Recent Labs    01/26/20 0430 01/26/20 0430 01/30/20 1350 02/02/20 0627 02/05/20 0500 02/08/20 0500  BILITOT 0.8  --   --  0.4 0.3 0.3  AST 96*  --   --  24 19 20   ALT 85*  --   --  21 20 21   ALKPHOS 86  --   --  82 90 86  PROT 4.3*  --   --  4.1* 4.2* 4.3*  ALBUMIN 1.2*   < > 1.2* 1.1* <1.0* 1.1*   < > = values in this interval not displayed.    Assessment and Plan:  CT on 5/3 showed a 10.5 x 6.6 cm collection of fluid and gas  concerning for Abscess.  S/P placement of a LUQ drain 02/07/20 by Dr. Miles Costain.  WBC down to 22.6 (was 35.8).  Continue routine drain care with flushes.  Recommend repeat CT scan when output is down to 10-15 mL per day and clearing.  Electronically Signed: Gwynneth Macleod, PA-C 02/08/2020, 10:20 AM    I spent a total of 15 Minutes at the the patient's bedside AND on the patient's hospital floor or unit, greater than 50% of which was counseling/coordinating care for f/u LUQ drain.

## 2020-02-08 NOTE — Progress Notes (Signed)
Plan made with Cicero Duck to meet tomorrow morning at 1030 am for ongoing GOC discussion.  [name], AGNP-C Palliative Medicine  Please call Palliative Medicine team phone with any questions [phone]. For individual providers please see AMION.

## 2020-02-08 NOTE — Progress Notes (Signed)
Flexiseal deflated and removed, copiour amounts of barrier cream applied and brief secured

## 2020-02-08 NOTE — Progress Notes (Signed)
[facility]                                                                                                                                                                                                           Daily Progress Note   Patient Name: Natasha Jacobson       Date: 02/08/2020 DOB: 02/06/1951  Age: 69 y.o. MRN#: [id number] Attending Physician: Aline August, MD Primary Care Physician: Concepcion Elk, MD Admit Date: 01/13/2020  Reason for Consultation/Follow-up: Establishing goals of care and Pain control  Subjective: Patient lying in bed, awake, alert. She is tired. Feels like she is drowning in a bowl of soup. Denies shortness of breath. Pain is better controlled. Discussed her feelings about hospitalization and ongoing aggressive care- she notes that sometimes she loses her positivity, but she wants to keep trying.   Length of Stay: 26  Current Medications: Scheduled Meds:  . sodium chloride   Intravenous Once  . acetaminophen (TYLENOL) oral liquid 160 mg/5 mL  650 mg Per Tube Q6H  . Chlorhexidine Gluconate Cloth  6 each Topical Daily  . feeding supplement (ENSURE ENLIVE)  237 mL Oral TID BM  . feeding supplement (PRO-STAT SUGAR FREE 64)  30 mL Per Tube Daily  . feeding supplement (VITAL 1.5 CAL)  840 mL Per Tube Q24H  . free water  150 mL Per Tube Q4H  . geriatric multivitamins-minerals  15 mL Per Tube Daily  . HYDROmorphone HCl  24 mg Oral Q24H  . insulin aspart  0-6 Units Subcutaneous Q4H  . levothyroxine  50 mcg Per Tube Q0600  . lipase/protease/amylase  12,000 Units Oral TID AC  . magic mouthwash  5 mL Oral QID  . melatonin  3 mg Oral QHS  . pantoprazole sodium  40 mg Per Tube BID  . potassium chloride  40 mEq Oral Once  . pregabalin  75 mg Oral BID  . sodium chloride flush  10-40 mL Intracatheter Q12H  . sodium chloride flush  5 mL Intracatheter Q8H  . thiamine  100 mg Per Tube Daily  . traZODone  50 mg Oral QHS    Continuous Infusions: . piperacillin-tazobactam (ZOSYN)   IV 3.375 g (02/08/20 0608)    PRN Meds: albuterol, Gerhardt's butt cream, guaiFENesin-dextromethorphan, HYDROmorphone, ipratropium-albuterol, loperamide HCl, ondansetron (ZOFRAN) IV, oxyCODONE, Resource ThickenUp Clear, sodium chloride flush  Physical Exam          Vital Signs: BP 105/70   Pulse (P) 82   Temp 97.7 F (36.5 C) (Oral)   Resp 19   Ht  5\' 4"  (1.626 m)   Wt 61.2 kg   SpO2 100%   BMI 23.16 kg/m  SpO2: SpO2: 100 % O2 Device: O2 Device: Nasal Cannula O2 Flow Rate: O2 Flow Rate (L/min): 3 L/min  Intake/output summary:   Intake/Output Summary (Last 24 hours) at 02/08/2020 1149 Last data filed at 02/08/2020 1130 Gross per 24 hour  Intake 1607.42 ml  Output 280 ml  Net 1327.42 ml   LBM: Last BM Date: 02/07/20 Baseline Weight: Weight: 50.7 kg Most recent weight: Weight: 61.2 kg       Palliative Assessment/Data: PPS: 30%    Flowsheet Rows     Most Recent Value  Intake Tab  Referral Department  Critical care  Unit at Time of Referral  ICU  Date Notified  01/24/20  Palliative Care Type  New Palliative care  Reason for referral  Clarify Goals of Care  Date of Admission  01/13/20  Date first seen by Palliative Care  01/25/20  # of days Palliative referral response time  1 Day(s)  # of days IP prior to Palliative referral  11  Clinical Assessment  Psychosocial & Spiritual Assessment  Palliative Care Outcomes      Patient Active Problem List   Diagnosis Date Noted  . Acute respiratory distress   . Palliative care encounter   . Protein-calorie malnutrition, severe 01/27/2020  . HAP (hospital-acquired pneumonia)   . Septic shock (HCC)   . Goals of care, counseling/discussion   . Palliative care by specialist   . DNR (do not resuscitate)   . Advanced care planning/counseling discussion   . Acute encephalopathy   . Hemorrhagic shock (HCC)   . AKI (acute kidney injury) (HCC)   . Anemia   . Essential hypertension 01/14/2020  . Acute pancreatitis 01/14/2020   . Gastrointestinal hemorrhage   . Acute respiratory failure (HCC)   . Pancreatitis, acute 01/13/2020  . Generalized anxiety disorder 12/20/2019  . 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 Chronic pain syndrome 08/02/2018  . Chronic hepatitis C without hepatic coma (HCC) 08/18/2017  . Hypothyroidism 07/08/2016  . Hearing loss, right 07/08/2016  . COPD (chronic obstructive pulmonary disease) (HCC) 09/06/2015  . SSS (sick sinus syndrome) (HCC) 09/06/2015    Palliative Care Assessment & Plan   Patient Profile: 69 y.o.femalewith past medical history of COPD,chronic Hep C,chronic pancreatitis, anxiety, chronic pain,pancreatic gastrostomy stent exchanged on 4/8admitted on 4/9/2021with hematemesis, red bloody bowel movements, abdominal pain, hypotension and low Hgb. Clinically worsened on 4/10 requiring intubation. 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 Urine + e. Faecalis. Evidence of possible renal infarct on CT scan. She was extubated 4/11, reintubated 4/12, extubated 4/16, and reintubated 4/19. Now with pneumonia and septic shock requiring vasopressors. Hypoalbumin at 1.3. Transaminases at creatinine are both trending up. She has remained encephalopathic during entire admission. Palliative medicine consulted for goals of care.Mental status is greatly improved- she is working towards going to SNF.  Assessment/Recommendations/Plan  Continue current level of care Will defer feeding tube discussion until able to see if po intake increases with treatment of splenic abscess  Goals of Care and Additional Recommendations: Limitations on Scope of Treatment: Full Scope Treatment  Code Status: DNR  Prognosis:  Unable to determine  Discharge Planning: To Be  Determined  Care plan was discussed with patient.  Thank you for allowing the Palliative  Medicine Team to assist in the care of this patient.   Time In: 1100 Time Out: 1135 Total Time 35 mins Prolonged Time Billed no      Greater than 50%  of this time was spent counseling and coordinating care related to the above assessment and plan.  [name], AGNP-C Palliative Medicine   Please contact Palliative Medicine Team phone at [phone] for questions and concerns.

## 2020-02-08 NOTE — Progress Notes (Signed)
Patient ID: Natasha Jacobson, female   DOB: 1951-07-09, 69 y.o.   MRN: [id number]  PROGRESS NOTE    Lillith Mcneff  YTK:[id number] DOB: [birth year] DOA: 01/13/2020 PCP: Ardyth Gal, MD   Brief Narrative:  69 year old female with history of chronic alcoholic pancreatitis, status post a recent pancreatico gastrostomy stent at [facility] on 01/12/20, admitted here with acute GI bleed due to splenic artery pseudoaneurysm status post embolization. She presented to [place] with recurrent abdominal pain and hematemesis, and hospital course was complicated by hypotension and hemorrhagic shock requiring multiple PRBC transfusion, invasive mechanical ventilation as well as vasopressors.  GI was consulted and underwent an EGD which showed active upper GI bleeding, and a follow-up CT angiogram showed a 7 mm pseudoaneurysm over the pancreatic branch of the splenic artery.  IR was consulted and she underwent coil embolization of the pseudoaneurysm. She failed extubation on April 11 and had to be reintubated on April 12 due to increased work of breathing and respiratory distress, eventually being extubated.  Failed again extubation on April 16 and had to be reintubated April 19, she was diagnosed with left-sided pneumonia at that time and placed on antibiotics.  She was eventually extubated April 22nd, transferred to [facility] on April 23rd. Patient had persistent leukocytosis and abdominal pain, underwent a CT scan of the abdomen and pelvis on 4/24 which was fairly unremarkable however was without contrast due to slightly elevated creatinine.  Given persistent pain and leukocytosis she had a repeat CT scan on 5/3 with contrast which showed an intra-abdominal abscess for which IR was consulted.  She underwent CT-guided drainage of the abscess by IR on 02/07/2020.  She was started on Zosyn on 02/07/2020.  Assessment & Plan:  Acute on chronic alcoholic pancreatitis, complicated with hemorrhagic shock and acute blood loss anemia due to upper  GI bleed, POA -this is due to a splenic artery pseudoaneurysm, now status post embolization by IR on 4/20. -She has a chronic component of abdominal pain, however had persistent pain over the last several days, worsening, as well as significant leukocytosis. initial CT scan on 4/24 was without acute findings: it was without contrast.  Given stable renal function normal, she had a repeat CT scan on 5/30 which showed an intra-abdominal abscess.  She underwent CT-guided drainage of the abscess by IR on 02/07/2020.  She was started on Zosyn on 02/07/2020. -She also has persistent diarrhea, C. difficile was negative.  This is likely due to tube feeds. -continue keeping the cortrak in because she has little to no p.o. intake.  They also recommended a PEG tube however this is a national back order.  -Still complaining of severe abdominal pain.  She is already on oral Exalgo along with as needed oral Dilaudid as per palliative care team.  We will add as needed oxycodone for breakthrough. -Overall prognosis is guarded to poor.  I think she is appropriate for hospice/comfort measures.  If pain is still out of control, recommend continuous intravenous morphine/Dilaudid drip.    Leukocytosis -White count has improved to 22.6 from 35.3 yesterday.  Monitor.  Continue antibiotic as above.  Acute hypoxic respiratory failure due to left lower lobe aspiration pneumonia -now off mechanical ventilation, respiratory cultures showed MSSA and Enterobacter, status post completed treatment with linezolid and cefepime. -Continue oxymetry monitoring and aspiration precautions, as needed bronchodilators and as tolerated airway clearing techniques with flutter valve and incentive spirometer. -Currently requiring 3 L oxygen via nasal cannula.  AKI with hypernatremia/ hypokalemia/ hyperchloremia/ hyperchloremic  non gap metabolic acidosis -patient has had significant diarrhea, C. difficile was negative.  Creatinine has remained  stable.   -Renal function has improved.  Acute on chronic anemia -Patient probably has anemia of chronic disease from prolonged hospitalization and chronic pancreatitis -Hemoglobin 6.8 this morning.  Transfuse 1 unit packed red cells.  Monitor H&H  Hypomagnesemia -Replace.  Repeat a.m. labs  Acute metabolic encephalopathy -likely component of ICU delirium as well, continues to improve, alert and oriented x4 mentating well right now  Severe calorie protein malnutrition-Continue withNutritional supplementation,thiamine and multivitamins.  Received albumin 5/2.  She is severely malnourished continue tube feeds as she has little to no p.o. intake -Albumin is 1.1 this morning.  Hypothyroidism-Continuelevothyroxine  Diabetes mellitus type 2 -continue sliding scale.  CBGs stable.  A1c was 5.4  Generalized deconditioning -Overall prognosis is very poor.  Recommend hospice/comfort measures.    DVT prophylaxis: SCDs Code Status: DNR Family Communication: Spoke to patient at bedside Disposition Plan: Status is: Inpatient  Remains inpatient appropriate because: New findings of intra-abdominal abscess requiring IR drainage and IV antibiotics.  Still in significant abdominal pain. Still on cortrak feeding   Dispo: The patient is from: Home              Anticipated d/c is to: SNF              Anticipated d/c date is: > 3 days              Patient currently is not medically stable to d/c.    Consultants: Palliative care/critical care/IR/GI  Procedures:  4/9 admitted; mass transfusion protocol 4/10 transfer to ICU; EGD, IR embolization 4/11 extubated 4/12 white count remains elevated at 39k, LFTs significantly elevated; continues to have dark red stools; hgb stable; minimal responsiveness; reintubated for increased work of breathing 4/13 LFTs improving. hgb stable. More responsive this morning. Minimal improvement in respiratory status. Increased BRBPR>>EGD 4/14 LFTs, renal  function improving. More encephalopathic this morning. hgb stable. 4/15continues to be severely encephalopathic>head CT neg; progressive hypernatremia 148>151 4/16 improved mental status, improved renal function; extubated; off pressors 4/17 remains encephalopathic 4/18 D/c to floor 4/19 Re-intubated and re-admitted to ICU 4/22 extubated 4/23 transferred to [facility] 4/24 CT abdomen pelvis without acute findings 5/3 CT abdomen pelvis left upper quadrant abscess  5/4 IR abscess drainage  Antimicrobials:  Anti-infectives (From admission, onward)   Start     Dose/Rate Route Frequency Ordered Stop   02/08/20 0000  piperacillin-tazobactam (ZOSYN) IVPB 3.375 g     3.375 g 12.5 mL/hr over 240 Minutes Intravenous Every 8 hours 02/07/20 2159     02/07/20 1530  piperacillin-tazobactam (ZOSYN) IVPB 3.375 g  Status:  Discontinued     3.375 g 12.5 mL/hr over 240 Minutes Intravenous Every 8 hours 02/07/20 1421 02/07/20 2159   01/26/20 0000  vancomycin (VANCOCIN) IVPB 1000 mg/200 mL premix  Status:  Discontinued     1,000 mg 200 mL/hr over 60 Minutes Intravenous Every 48 hours 01/24/20 0032 01/24/20 0934   01/25/20 1145  linezolid (ZYVOX) IVPB 600 mg  Status:  Discontinued     600 mg 300 mL/hr over 60 Minutes Intravenous Every 12 hours 01/25/20 1053 01/26/20 1044   01/24/20 0115  vancomycin (VANCOCIN) IVPB 1000 mg/200 mL premix     1,000 mg 200 mL/hr over 60 Minutes Intravenous  Once 01/24/20 0016 01/24/20 0254   01/23/20 2315  ceFEPIme (MAXIPIME) 2 g in sodium chloride 0.9 % 100 mL IVPB  2 g 200 mL/hr over 30 Minutes Intravenous Every 24 hours 01/23/20 2306 01/30/20 2359   01/19/20 1015  ampicillin (OMNIPEN) 2 g in sodium chloride 0.9 % 100 mL IVPB     2 g 300 mL/hr over 20 Minutes Intravenous Every 12 hours 01/19/20 0928 01/21/20 2131   01/18/20 2200  ampicillin (OMNIPEN) 1 g in sodium chloride 0.9 % 100 mL IVPB  Status:  Discontinued     1 g 300 mL/hr over 20 Minutes Intravenous Every 12  hours 01/18/20 1104 01/19/20 0928   01/16/20 1045  ampicillin (OMNIPEN) 1 g in sodium chloride 0.9 % 100 mL IVPB  Status:  Discontinued     1 g 300 mL/hr over 20 Minutes Intravenous Every 8 hours 01/16/20 0945 01/18/20 1104   01/14/20 0800  cefTRIAXone (ROCEPHIN) 2 g in sodium chloride 0.9 % 100 mL IVPB  Status:  Discontinued     2 g 200 mL/hr over 30 Minutes Intravenous Every 24 hours 01/14/20 0508 01/16/20 0945   01/13/20 2030  piperacillin-tazobactam (ZOSYN) IVPB 3.375 g     3.375 g 100 mL/hr over 30 Minutes Intravenous  Once 01/13/20 2017 01/13/20 2227       Subjective: Patient seen and examined at bedside.  She is awake and complains of severe intermittent abdominal pain and states that current pain regimen is not helping.  Her oral intake is still very poor.  No overnight fever or vomiting.  Objective: Vitals:   02/08/20 1045 02/08/20 1100 02/08/20 1115 02/08/20 1130  BP: 105/71 101/69 105/68 105/70  Pulse:      Resp: (!) 22 18 (!) 22 19  Temp: 98.2 F (36.8 C) 98.2 F (36.8 C) 98 F (36.7 C) 97.7 F (36.5 C)  TempSrc: Oral Oral  Oral  SpO2:    100%  Weight:      Height:        Intake/Output Summary (Last 24 hours) at 02/08/2020 1352 Last data filed at 02/08/2020 1130 Gross per 24 hour  Intake 1487.42 ml  Output 280 ml  Net 1207.42 ml   Filed Weights   02/06/20 0624 02/07/20 0340 02/08/20 0419  Weight: 64.7 kg 60.4 kg 61.2 kg    Examination:  General exam: Appears chronically ill, very deconditioned.  Older than stated age.  Cortrak present Respiratory system: Bilateral decreased breath sounds at bases with some scattered crackles Cardiovascular system: S1 & S2 heard, Rate controlled Gastrointestinal system: Abdomen is nondistended, soft and diffusely tender. Normal bowel sounds heard. Extremities: No cyanosis, clubbing, edema  Central nervous system: Alert and oriented. No focal neurological deficits. Moving extremities Skin: No rashes, lesions or  ulcers Psychiatry: Extremely anxious    Data Reviewed: I have personally reviewed following labs and imaging studies  CBC: Recent Labs  Lab 02/02/20 0627 02/05/20 0500 02/08/20 0500  WBC 32.0* 35.3* 22.6*  HGB 8.4* 7.9* 6.8*  HCT 26.9* 25.3* 22.3*  MCV 96.8 97.3 98.2  PLT 401* 622* 687*   Basic Metabolic Panel: Recent Labs  Lab 02/02/20 0627 02/05/20 0500 02/08/20 0500  NA 137 137 138  K 4.2 4.1 4.0  CL 112* 108 105  CO2 18* 23 25  GLUCOSE 131* 142* 158*  BUN 32* 37* 40*  CREATININE 1.08* 1.06* 0.88  CALCIUM 7.5* 7.7* 7.6*  MG 1.5* 1.4* 1.5*  PHOS 3.7 3.6 3.1   GFR: Estimated Creatinine Clearance: 53.6 mL/min (by C-G formula based on SCr of 0.88 mg/dL). Liver Function Tests: Recent Labs  Lab 02/02/20 0627 02/05/20 0500  02/08/20 0500  AST 24 19 20   ALT 21 20 21   ALKPHOS 82 90 86  BILITOT 0.4 0.3 0.3  PROT 4.1* 4.2* 4.3*  ALBUMIN 1.1* <1.0* 1.1*   No results for input(s): LIPASE, AMYLASE in the last 168 hours. No results for input(s): AMMONIA in the last 168 hours. Coagulation Profile: Recent Labs  Lab 02/06/20 1721  INR 1.2   Cardiac Enzymes: No results for input(s): CKTOTAL, CKMB, CKMBINDEX, TROPONINI in the last 168 hours. BNP (last 3 results) No results for input(s): PROBNP in the last 8760 hours. HbA1C: No results for input(s): HGBA1C in the last 72 hours. CBG: Recent Labs  Lab 02/07/20 1629 02/07/20 1924 02/07/20 2320 02/08/20 0418 02/08/20 1106  GLUCAP 83 127* 134* 148* 111*   Lipid Profile: No results for input(s): CHOL, HDL, LDLCALC, TRIG, CHOLHDL, LDLDIRECT in the last 72 hours. Thyroid Function Tests: No results for input(s): TSH, T4TOTAL, FREET4, T3FREE, THYROIDAB in the last 72 hours. Anemia Panel: No results for input(s): VITAMINB12, FOLATE, FERRITIN, TIBC, IRON, RETICCTPCT in the last 72 hours. Sepsis Labs: No results for input(s): PROCALCITON, LATICACIDVEN in the last 168 hours.  Recent Results (from the past 240  hour(s))  Aerobic/Anaerobic Culture (surgical/deep wound)     Status: None (Preliminary result)   Collection Time: 02/07/20 11:12 AM   Specimen: Abscess  Result Value Ref Range Status   Specimen Description ABSCESS  Final   Special Requests Normal  Final   Gram Stain   Final    MODERATE WBC PRESENT, PREDOMINANTLY PMN ABUNDANT GRAM POSITIVE COCCI IN CHAINS ABUNDANT GRAM NEGATIVE RODS MODERATE GRAM POSITIVE RODS    Culture   Final    CULTURE REINCUBATED FOR BETTER GROWTH Performed at [facility], [place] [address] [place],  [address]    Report Status PENDING  Incomplete         Radiology Studies: CT SOFT TISSUE NECK WO CONTRAST  Result Date: 02/07/2020 CLINICAL DATA:  69 year old female with diffuse neck pain without known injury. Bilateral pain. Suspected splenic abscess on CT Abdomen and Pelvis yesterday. EXAM: CT NECK WITHOUT CONTRAST TECHNIQUE: Multidetector CT imaging of the neck was performed following the standard protocol without intravenous contrast. COMPARISON:  Head CT 01/19/2020. FINDINGS: Pharynx and larynx: The glottis is closed. Otherwise negative noncontrast larynx. Right side nasal enteric tube is in place and courses into the esophagus. Otherwise negative noncontrast pharynx. Negative parapharyngeal and retropharyngeal spaces. Salivary glands: Negative sublingual space. Noncontrast submandibular glands and parotid glands appear symmetric and negative. Thyroid: Diminutive, negative. Lymph nodes: No cervical lymphadenopathy is identified. Vascular: Vascular patency is not evaluated in the absence of IV contrast. Scattered calcified atherosclerosis. Limited intracranial: Stable, negative. Visualized orbits: Negative. Mastoids and visualized paranasal sinuses: Right side nasoenteric tube. Mild mucosal thickening in the paranasal sinuses has regressed since last month. Mild mastoid effusions are stable. Tympanic cavities appear clear. Skeleton: Largely absent  dentition. Cervical spine facet degeneration. Severe left shoulder osseous degeneration. No acute or suspicious osseous lesion identified. Upper chest: Enteric tube continues in the thoracic esophagus which appears unremarkable. Left chest cardiac pacemaker type device. Calcified aortic atherosclerosis. No superior mediastinal lymphadenopathy. Other: Centrilobular emphysema. Small to moderate left and small right layering pleural effusions. Partially visible calcified granulomas in the right lung. No axillary lymphadenopathy. IMPRESSION: 1. Negative noncontrast CT appearance of the Neck aside from calcified carotid atherosclerosis. 2. Partially visible bilateral pleural effusions and Emphysema (ICD10-J43.9). 3. Right side nasoenteric tube in place, partially visible with no adverse features.  4. Aortic Atherosclerosis (ICD10-I70.0). Electronically Signed   By: Odessa FlemingH  Hall M.D.   On: 02/07/2020 11:30   CT IMAGE GUIDED DRAINAGE PERCUT CATH  PERITONEAL RETROPERIT  Result Date: 02/07/2020 INDICATION: Splenectomy bed abscess by CT EXAM: CT drainage left upper quadrant splenectomy bed drain placement MEDICATIONS: The patient is currently admitted to the hospital and receiving intravenous antibiotics. The antibiotics were administered within an appropriate time frame prior to the initiation of the procedure. ANESTHESIA/SEDATION: Fentanyl 50 mcg IV; Versed 0.5 mg IV Moderate Sedation Time:  19 minutes The patient was continuously monitored during the procedure by the interventional radiology nurse under my direct supervision. COMPLICATIONS: None immediate. PROCEDURE: Informed written consent was obtained from the patient after a thorough discussion of the procedural risks, benefits and alternatives. All questions were addressed. Maximal Sterile Barrier Technique was utilized including caps, mask, sterile gowns, sterile gloves, sterile drape, hand hygiene and skin antiseptic. A timeout was performed prior to the initiation of  the procedure. Previous imaging reviewed. Patient positioned left anterior oblique. Noncontrast localization CT performed. The left upper quadrant splenic bed abscess was localized and marked. Under sterile conditions and local anesthesia, an 18 gauge 10 cm access was advanced into the fluid collection. Needle position confirmed with CT. Syringe aspiration yielded bloody exudative malodorous fluid compatible with infected hematoma. Sample sent for culture. Guidewire inserted followed by tract dilatation to insert a 12 [place]French drain. Drain catheter position confirmed with CT. Catheter secured with a prolene suture and connected to external suction bulb. Sterile dressing applied. No immediate complication. Patient tolerated the procedure well. IMPRESSION: Successful CT-guided left upper quadrant splenic bed abscess drain placement as above. Electronically Signed   By: Judie PetitM.  Shick M.D.   On: 02/07/2020 12:18        Scheduled Meds: . sodium chloride   Intravenous Once  . acetaminophen (TYLENOL) oral liquid 160 mg/5 mL  650 mg Per Tube Q6H  . Chlorhexidine Gluconate Cloth  6 each Topical Daily  . feeding supplement (ENSURE ENLIVE)  237 mL Oral TID BM  . feeding supplement (PRO-STAT SUGAR FREE 64)  30 mL Per Tube Daily  . feeding supplement (VITAL 1.5 CAL)  840 mL Per Tube Q24H  . free water  150 mL Per Tube Q4H  . geriatric multivitamins-minerals  15 mL Per Tube Daily  . HYDROmorphone HCl  24 mg Oral Q24H  . insulin aspart  0-6 Units Subcutaneous Q4H  . levothyroxine  50 mcg Per Tube Q0600  . lipase/protease/amylase  12,000 Units Oral TID AC  . magic mouthwash  5 mL Oral QID  . melatonin  3 mg Oral QHS  . pantoprazole sodium  40 mg Per Tube BID  . potassium chloride  40 mEq Oral Once  . pregabalin  75 mg Oral BID  . sodium chloride flush  10-40 mL Intracatheter Q12H  . sodium chloride flush  5 mL Intracatheter Q8H  . thiamine  100 mg Per Tube Daily  . traZODone  50 mg Oral QHS   Continuous  Infusions: . piperacillin-tazobactam (ZOSYN)  IV 3.375 g (02/08/20 [id number])          Glade LloydKshitiz Sreya Froio, MD [facility] 02/08/2020, 1:52 PM

## 2020-02-09 DIAGNOSIS — R54 Age-related physical debility: Secondary | ICD-10-CM

## 2020-02-09 DIAGNOSIS — K625 Hemorrhage of anus and rectum: Secondary | ICD-10-CM | POA: Diagnosis not present

## 2020-02-09 DIAGNOSIS — Z515 Encounter for palliative care: Secondary | ICD-10-CM

## 2020-02-09 DIAGNOSIS — K852 Alcohol induced acute pancreatitis without necrosis or infection: Secondary | ICD-10-CM | POA: Diagnosis not present

## 2020-02-09 LAB — CBC WITH DIFFERENTIAL/PLATELET
Abs Immature Granulocytes: 0.22 10*3/uL — ABNORMAL HIGH (ref 0.00–0.07)
Basophils Absolute: 0.2 10*3/uL — ABNORMAL HIGH (ref 0.0–0.1)
Basophils Relative: 1 %
Eosinophils Absolute: 0.1 10*3/uL (ref 0.0–0.5)
Eosinophils Relative: 0 %
HCT: 29.9 % — ABNORMAL LOW (ref 36.0–46.0)
Hemoglobin: 9.4 g/dL — ABNORMAL LOW (ref 12.0–15.0)
Immature Granulocytes: 1 %
Lymphocytes Relative: 10 %
Lymphs Abs: 1.9 10*3/uL (ref 0.7–4.0)
MCH: 30.3 pg (ref 26.0–34.0)
MCHC: 31.4 g/dL (ref 30.0–36.0)
MCV: 96.5 fL (ref 80.0–100.0)
Monocytes Absolute: 1.5 10*3/uL — ABNORMAL HIGH (ref 0.1–1.0)
Monocytes Relative: 8 %
Neutro Abs: 15.4 10*3/uL — ABNORMAL HIGH (ref 1.7–7.7)
Neutrophils Relative %: 80 %
Platelets: 681 10*3/uL — ABNORMAL HIGH (ref 150–400)
RBC: 3.1 MIL/uL — ABNORMAL LOW (ref 3.87–5.11)
RDW: 15.8 % — ABNORMAL HIGH (ref 11.5–15.5)
WBC Morphology: INCREASED
WBC: 19.2 10*3/uL — ABNORMAL HIGH (ref 4.0–10.5)
nRBC: 0.4 % — ABNORMAL HIGH (ref 0.0–0.2)

## 2020-02-09 LAB — BPAM RBC
Blood Product Expiration Date: 202105112359
ISSUE DATE / TIME: 202105050925
Unit Type and Rh: 600

## 2020-02-09 LAB — COMPREHENSIVE METABOLIC PANEL
ALT: 23 U/L (ref 0–44)
AST: 27 U/L (ref 15–41)
Albumin: 1.2 g/dL — ABNORMAL LOW (ref 3.5–5.0)
Alkaline Phosphatase: 93 U/L (ref 38–126)
Anion gap: 8 (ref 5–15)
BUN: 31 mg/dL — ABNORMAL HIGH (ref 8–23)
CO2: 24 mmol/L (ref 22–32)
Calcium: 7.4 mg/dL — ABNORMAL LOW (ref 8.9–10.3)
Chloride: 105 mmol/L (ref 98–111)
Creatinine, Ser: 0.83 mg/dL (ref 0.44–1.00)
GFR calc Af Amer: >60 mL/min (ref >60)
GFR calc non Af Amer: >60 mL/min (ref >60)
Glucose, Bld: 141 mg/dL — ABNORMAL HIGH (ref 70–99)
Potassium: 4 mmol/L (ref 3.5–5.1)
Sodium: 137 mmol/L (ref 135–145)
Total Bilirubin: 0.5 mg/dL (ref 0.3–1.2)
Total Protein: 4.8 g/dL — ABNORMAL LOW (ref 6.5–8.1)

## 2020-02-09 LAB — GLUCOSE, CAPILLARY
Glucose-Capillary: 143 mg/dL — ABNORMAL HIGH (ref 70–99)
Glucose-Capillary: 105 mg/dL — ABNORMAL HIGH (ref 70–99)
Glucose-Capillary: 118 mg/dL — ABNORMAL HIGH (ref 70–99)
Glucose-Capillary: 110 mg/dL — ABNORMAL HIGH (ref 70–99)

## 2020-02-09 LAB — MAGNESIUM: Magnesium: 1.6 mg/dL — ABNORMAL LOW (ref 1.7–2.4)

## 2020-02-09 LAB — TYPE AND SCREEN
ABO/RH(D): A NEG
Antibody Screen: NEGATIVE
Unit division: 0

## 2020-02-09 MED ORDER — HALOPERIDOL LACTATE 2 MG/ML PO CONC
0.5000 mg | ORAL | Status: DC | PRN
  Filled 2020-02-09: qty 0.3

## 2020-02-09 MED ORDER — POLYVINYL ALCOHOL 1.4 % OP SOLN
1.0000 [drp] | Freq: Four times a day (QID) | OPHTHALMIC | Status: DC | PRN
  Filled 2020-02-09: qty 15

## 2020-02-09 MED ORDER — GLYCOPYRROLATE 0.2 MG/ML IJ SOLN: 0.2000 mg | INTRAMUSCULAR | Status: DC | PRN

## 2020-02-09 MED ORDER — BIOTENE DRY MOUTH MT LIQD: 15.0000 mL | OROMUCOSAL | Status: DC | PRN

## 2020-02-09 MED ORDER — HYDROMORPHONE BOLUS VIA INFUSION: 2.0000 mg | INTRAVENOUS | Status: DC | PRN

## 2020-02-09 MED ORDER — LORAZEPAM 2 MG/ML PO CONC: 1.0000 mg | ORAL | Status: DC | PRN

## 2020-02-09 MED ORDER — HALOPERIDOL LACTATE 5 MG/ML IJ SOLN: 0.5000 mg | INTRAMUSCULAR | Status: DC | PRN

## 2020-02-09 MED ORDER — MAGNESIUM SULFATE 2 GM/50ML IV SOLN
2.0000 g | Freq: Once | INTRAVENOUS | Status: AC
  Administered 2020-02-09: 09:00:00 2 g via INTRAVENOUS
  Filled 2020-02-09: qty 50

## 2020-02-09 MED ORDER — CHOLESTYRAMINE 4 G PO PACK
4.0000 g | PACK | Freq: Two times a day (BID) | ORAL | Status: DC
  Filled 2020-02-09 (×4): qty 1

## 2020-02-09 MED ORDER — LORAZEPAM 1 MG PO TABS: 1.0000 mg | ORAL_TABLET | ORAL | Status: DC | PRN

## 2020-02-09 MED ORDER — HYDROMORPHONE BOLUS VIA INFUSION
1.0000 mg | INTRAVENOUS | Status: DC | PRN
  Administered 2020-02-09 – 2020-02-10 (×20): 1 mg via INTRAVENOUS
  Filled 2020-02-09: qty 1

## 2020-02-09 MED ORDER — LORAZEPAM 2 MG/ML IJ SOLN: 1.0000 mg | INTRAMUSCULAR | Status: DC | PRN

## 2020-02-09 MED ORDER — SODIUM CHLORIDE 0.9 % IV SOLN
0.5000 mg/h | INTRAVENOUS | Status: DC
  Administered 2020-02-09 – 2020-02-10 (×2): 0.5 mg/h via INTRAVENOUS
  Filled 2020-02-09: qty 2.5
  Filled 2020-02-09: qty 5

## 2020-02-09 MED ORDER — GLYCOPYRROLATE 1 MG PO TABS: 1.0000 mg | ORAL_TABLET | ORAL | Status: DC | PRN

## 2020-02-09 MED ORDER — ACETAMINOPHEN 650 MG RE SUPP
650.0000 mg | Freq: Four times a day (QID) | RECTAL | Status: DC | PRN
  Filled 2020-02-09: qty 1

## 2020-02-09 MED ORDER — ACETAMINOPHEN 325 MG PO TABS: 650.0000 mg | ORAL_TABLET | Freq: Four times a day (QID) | ORAL | Status: DC | PRN

## 2020-02-09 MED ORDER — HALOPERIDOL 0.5 MG PO TABS
0.5000 mg | ORAL_TABLET | ORAL | Status: DC | PRN
  Filled 2020-02-09: qty 1

## 2020-02-09 MED ORDER — GLYCOPYRROLATE 1 MG PO TABS
1.0000 mg | ORAL_TABLET | ORAL | Status: DC | PRN
  Filled 2020-02-09: qty 1

## 2020-02-09 NOTE — Progress Notes (Signed)
Nutrition Brief Note  Chart reviewed. Pt now transitioning to comfort care.  No further nutrition interventions warranted at this time. Tube feeds have been discontinued. Please re-consult as needed.   Earma Reading, MS, RD, LDN Inpatient Clinical Dietitian Pager: [phone] Weekend/After Hours: [phone]

## 2020-02-09 NOTE — TOC Initial Note (Signed)
Transition of Care [facility]) - Initial/Assessment Note    Patient Details  Name: Natasha Jacobson MRN: [id number] Date of Birth: 1951-09-12  [facility]) CM/SW Contact:    Alberteen Sam, LCSW Phone Number: 02/09/2020, 1:01 PM  Clinical Narrative:                  CSW made referral to [place] with [facility], she is reviewing referral and will reach out to patient's daughter Natasha Jacobson.   Expected Discharge Plan: [place] Barriers to Discharge: Hospice Bed not available   Patient Goals and CMS Choice Patient states their goals for this hospitalization and ongoing recovery are:: Daughter To get stronger CMS Medicare.gov Compare Post Acute Care list provided to:: Patient Represenative (must comment)(daughter Natasha Jacobson) Choice offered to / list presented to : Adult Children  Expected Discharge Plan and Services Expected Discharge Plan: [place]   Discharge Planning Services: Other - See comment(SNF)   Living arrangements for the past 2 months: Single Family Home                                      Prior Living Arrangements/Services Living arrangements for the past 2 months: Single Family Home Lives with:: Self Patient language and need for interpreter reviewed:: Yes        Need for Family Participation in Patient Care: Yes (Comment) Care giver support system in place?: Yes (comment)   Criminal Activity/Legal Involvement Pertinent to Current Situation/Hospitalization: No - Comment as needed  Activities of Daily Living Home Assistive Devices/Equipment: None ADL Screening (condition at time of admission) Patient's cognitive ability adequate to safely complete daily activities?: Yes Is the patient deaf or have difficulty hearing?: No Does the patient have difficulty seeing, even when wearing glasses/contacts?: No Does the patient have difficulty concentrating, remembering, or making decisions?: No Patient able to express need  for assistance with ADLs?: Yes Does the patient have difficulty dressing or bathing?: No Independently performs ADLs?: Yes (appropriate for developmental age) Does the patient have difficulty walking or climbing stairs?: No Weakness of Legs: None Weakness of Arms/Hands: None  Permission Sought/Granted Permission sought to share information with : Case Manager, Customer service manager, Family Supports    Share Information with NAME: Natasha Jacobson  Permission granted to share info w AGENCY: [facility]  Permission granted to share info w Relationship: daughter  Permission granted to share info w Contact Information: [phone]  Emotional Assessment Appearance:: Appears stated age Attitude/Demeanor/Rapport: Unresponsive Affect (typically observed): Withdrawn Orientation: : Oriented to Place, Oriented to Self, Oriented to  Time, Oriented to Situation Alcohol / Substance Use: Not Applicable Psych Involvement: No (comment)  Admission diagnosis:  Pancreatitis, acute [K85.90] AKI (acute kidney injury) ([place]) [N17.9] Hematemesis with nausea [K92.0] Anemia, unspecified type [D64.9] Acute pancreatitis without infection or necrosis, unspecified pancreatitis type [K85.90] Acute pancreatitis [K85.90] Patient Active Problem List   Diagnosis Date Noted  . Frailty   . Comfort measures only status   . Intra-abdominal abscess ([place])   . Rectal bleeding   . Acute respiratory distress   . Palliative care encounter   . Protein-calorie malnutrition, severe 01/27/2020  . HAP (hospital-acquired pneumonia)   . Septic shock ([place])   . Goals of care, counseling/discussion   . Palliative care by specialist   . DNR (do not resuscitate)   . Advanced care planning/counseling discussion   . Acute encephalopathy   .  Hemorrhagic shock (HCC)   . AKI (acute kidney injury) (HCC)   . Anemia   . Essential hypertension 01/14/2020  . Acute pancreatitis 01/14/2020  . Gastrointestinal hemorrhage   . Acute  respiratory failure (HCC)   . Pancreatitis, acute 01/13/2020  . Generalized anxiety disorder 12/20/2019  . 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 Chronic pain syndrome 08/02/2018  . Chronic hepatitis C without hepatic coma (HCC) 08/18/2017  . Hypothyroidism 07/08/2016  . Hearing loss, right 07/08/2016  . COPD (chronic obstructive pulmonary disease) (HCC) 09/06/2015  . SSS (sick sinus syndrome) (HCC) 09/06/2015   PCP:  Ardyth Gal, MD Pharmacy:   [facility] [place], [place] - [address] [place]  [address] Phone: [phone] Fax: [phone]     Social Determinants of Health (SDOH) Interventions    Readmission Risk Interventions No flowsheet data found.

## 2020-02-09 NOTE — Progress Notes (Signed)
PT Cancellation Note  Patient Details Name: Natasha Jacobson MRN: [id number] DOB: 22-Dec-1950   Cancelled Treatment:    Reason Eval/Treat Not Completed: Patient declined, no reason specified(pt states she is not sure what she wants to be able to do or what her plan is. She refused any mobility other than one roll and refused to even move to get soiled brief changed. Will decrease to 1x per week and see what pt determines after palliative meeting)   [name] 02/09/2020, 10:36 AM Merryl Hacker, PT Acute Rehabilitation Services Pager: [phone] Office: [phone]

## 2020-02-09 NOTE — Progress Notes (Signed)
Referring Physician(s): Natasha Jacobson  Supervising Physician: Natasha Jacobson  Patient Status:  [name] - In-pt  Chief Complaint: LUQ Abscess  Subjective: Patient in bed, complaining of generalized pain. Slow to answer questions, minimally interactive.   Allergies: Buprenorphine and Aspirin  Medications: Prior to Admission medications   Medication Sig Start Date End Date Taking? Authorizing Provider  acetaminophen (TYLENOL) 500 MG tablet Take 2 tablets by mouth every 8 (eight) hours as needed. 11/01/19   [provider]  albuterol (VENTOLIN HFA) 108 (90 Base) MCG/ACT inhaler Inhale 2 puffs into the lungs every 4 (four) hours as needed. 11/24/18   [provider]  amLODipine (NORVASC) 5 MG tablet Take 1 tablet by mouth daily. 09/22/19   [provider]  baclofen (LIORESAL) 10 MG tablet Take 1 tablet by mouth daily as needed. 11/11/19   [provider]  ergocalciferol (VITAMIN D2) 1.25 MG (50000 UT) capsule Take 1 capsule by mouth once a week. 11/18/19   [provider]  fenofibrate (TRICOR) 48 MG tablet Take 1 tablet by mouth daily. 05/13/18   [provider]  gabapentin (NEURONTIN) 300 MG capsule Take 1 capsule by mouth 3 (three) times daily. 11/11/19 11/10/20  [provider]  levothyroxine (SYNTHROID) 50 MCG tablet Take 50 mcg by mouth daily before breakfast.    [provider]  Melatonin 5 MG CAPS Take 2 capsules by mouth at bedtime.    [provider]  Oxycodone HCl 10 MG TABS Take 10 mg by mouth every 6 (six) hours as needed for pain. 01/12/20   [provider]  Pancrelipase, Lip-Prot-Amyl, (CREON) 24000-76000 units CPEP Take 1 capsule by mouth 3 (three) times daily. 08/18/19   [provider]  pantoprazole (PROTONIX) 40 MG tablet Take 40 mg by mouth daily.    [provider]  polyethylene glycol powder (GLYCOLAX/MIRALAX) 17 GM/SCOOP powder Take 17 g by mouth as needed.    [provider]  senna (SENOKOT) 8.6 MG TABS tablet Take 1 tablet by mouth as needed for mild constipation.    [provider]  Tiotropium Bromide Monohydrate (SPIRIVA RESPIMAT) 1.25 MCG/ACT AERS Inhale 2 puffs into the lungs daily. 11/15/19 11/14/20  [provider]     Vital Signs: BP 117/74 (BP Location: Left Wrist)   Pulse 82   Temp 98.1 F (36.7 C) (Oral)   Resp 19   Ht 5\' 4"  (1.626 m)   Wt 134 lb 0.6 oz (60.8 kg)   SpO2 99%   BMI 23.01 kg/m   Physical Exam Appears uncomfortable, minimally interactive. Abdomen is soft, left side is tender. Drain intact, dressing is clean and dry. Approximately 25 cc purulent/serous fluid in the bulb. 100 cc output in the past 24 hours. Bedside RNs are flushing the drain per orders.   Imaging: CT SOFT TISSUE NECK WO CONTRAST  Result Date: 02/07/2020 CLINICAL DATA:  69 year old female with diffuse neck pain without known injury. Bilateral pain. Suspected splenic abscess on CT Abdomen and Pelvis yesterday. EXAM: CT NECK WITHOUT CONTRAST TECHNIQUE: Multidetector CT imaging of the neck was performed following the standard protocol without intravenous contrast. COMPARISON:  Head CT 01/19/2020. FINDINGS: Pharynx and larynx: The glottis is closed. Otherwise negative noncontrast larynx. Right side nasal enteric tube is in place and courses into the esophagus. Otherwise negative noncontrast pharynx. Negative parapharyngeal and retropharyngeal spaces. Salivary glands: Negative sublingual space. Noncontrast submandibular glands and parotid glands appear symmetric and negative. Thyroid: Diminutive, negative. Lymph nodes: No cervical lymphadenopathy is  identified. Vascular: Vascular patency is not evaluated in the absence of IV contrast. Scattered calcified atherosclerosis. Limited intracranial: Stable, negative. Visualized orbits: Negative. Mastoids and visualized paranasal sinuses: Right side nasoenteric tube. Mild mucosal thickening in the paranasal  sinuses has regressed since last month. Mild mastoid effusions are stable. Tympanic cavities appear clear. Skeleton: Largely absent dentition. Cervical spine facet degeneration. Severe left shoulder osseous degeneration. No acute or suspicious osseous lesion identified. Upper chest: Enteric tube continues in the thoracic esophagus which appears unremarkable. Left chest cardiac pacemaker type device. Calcified aortic atherosclerosis. No superior mediastinal lymphadenopathy. Other: Centrilobular emphysema. Small to moderate left and small right layering pleural effusions. Partially visible calcified granulomas in the right lung. No axillary lymphadenopathy. IMPRESSION: 1. Negative noncontrast CT appearance of the Neck aside from calcified carotid atherosclerosis. 2. Partially visible bilateral pleural effusions and Emphysema (ICD10-J43.9). 3. Right side nasoenteric tube in place, partially visible with no adverse features. 4. Aortic Atherosclerosis (ICD10-I70.0). Electronically Signed   By: Odessa Fleming M.D.   On: 02/07/2020 11:30   CT ABDOMEN PELVIS W CONTRAST  Result Date: 02/06/2020 CLINICAL DATA:  Abdominal abscess. EXAM: CT ABDOMEN AND PELVIS WITH CONTRAST TECHNIQUE: Multidetector CT imaging of the abdomen and pelvis was performed using the standard protocol following bolus administration of intravenous contrast. CONTRAST:  OMNIPAQUE IOHEXOL 300 MG/ML  SOLN COMPARISON:  January 28, 2020. FINDINGS: Lower chest: Moderate size bilateral pleural effusions are noted with adjacent subsegmental atelectasis. Hepatobiliary: No gallstones are noted. Mild intrahepatic and extrahepatic biliary dilatation is noted. The liver is unremarkable. Pancreas: Continued presence of pancreatic calcifications consistent with chronic pancreatitis. Pancreatic duct stent is noted and unchanged in position. Spleen: The spleen is not clearly visualized, but in its place is a 10.5 x 6.6 cm collection of fluid and gas concerning for abscess.  Adrenals/Urinary Tract: Adrenal glands appear normal. Mild bilateral hydronephrosis is noted without definite ureteral dilatation or ureteral obstruction. No definite nephrolithiasis is noted. Urinary bladder is unremarkable. Stomach/Bowel: Status post appendectomy. Feeding tube is seen entering and passing through stomach and duodenum with distal tip in the expected position of the proximal jejunum. Stomach is otherwise unremarkable. Stool and fluid is noted throughout the colon, but no bowel obstruction is noted. Rectal tube is noted. Vascular/Lymphatic: Aortic atherosclerosis. No enlarged abdominal or pelvic lymph nodes. Reproductive: Status post hysterectomy. No adnexal masses. Other: Moderate anasarca is noted. Minimal free fluid is noted in the pelvis. Musculoskeletal: No acute or significant osseous findings. IMPRESSION: Spleen is not clearly visualized, but in the splenic bed there is noted a 10.5 x 6.6 cm collection of fluid and gas concerning for abscess. Moderate size bilateral pleural effusions are noted with adjacent subsegmental atelectasis. Continued presence of pancreatic calcifications consistent with chronic pancreatitis. Pancreatic duct stent is noted and unchanged in position. Moderate anasarca is noted. Mild bilateral hydronephrosis is noted without definite ureteral dilatation or ureteral obstruction. Aortic Atherosclerosis (ICD10-I70.0). Electronically Signed   By: Lupita Raider M.D.   On: 02/06/2020 14:15   CT IMAGE GUIDED DRAINAGE PERCUT CATH  PERITONEAL RETROPERIT  Result Date: 02/07/2020 INDICATION: Splenectomy bed abscess by CT EXAM: CT drainage left upper quadrant splenectomy bed drain placement MEDICATIONS: The patient is currently admitted to the hospital and receiving intravenous antibiotics. The antibiotics were administered within an appropriate time frame prior to the initiation of the procedure. ANESTHESIA/SEDATION: Fentanyl 50 mcg IV; Versed 0.5 mg IV Moderate Sedation Time:   19 minutes The patient was continuously monitored during the procedure by the interventional  radiology nurse under my direct supervision. COMPLICATIONS: None immediate. PROCEDURE: Informed written consent was obtained from the patient after a thorough discussion of the procedural risks, benefits and alternatives. All questions were addressed. Maximal Sterile Barrier Technique was utilized including caps, mask, sterile gowns, sterile gloves, sterile drape, hand hygiene and skin antiseptic. A timeout was performed prior to the initiation of the procedure. Previous imaging reviewed. Patient positioned left anterior oblique. Noncontrast localization CT performed. The left upper quadrant splenic bed abscess was localized and marked. Under sterile conditions and local anesthesia, an 18 gauge 10 cm access was advanced into the fluid collection. Needle position confirmed with CT. Syringe aspiration yielded bloody exudative malodorous fluid compatible with infected hematoma. Sample sent for culture. Guidewire inserted followed by tract dilatation to insert a 12 [place] drain. Drain catheter position confirmed with CT. Catheter secured with a prolene suture and connected to external suction bulb. Sterile dressing applied. No immediate complication. Patient tolerated the procedure well. IMPRESSION: Successful CT-guided left upper quadrant splenic bed abscess drain placement as above. Electronically Signed   By: Judie Petit.  Shick M.D.   On: 02/07/2020 12:18    Labs:  CBC: Recent Labs    02/05/20 0500 02/08/20 0500 02/08/20 1504 02/09/20 0325  WBC 35.3* 22.6* 23.5* 19.2*  HGB 7.9* 6.8* 8.6* 9.4*  HCT 25.3* 22.3* 27.1* 29.9*  PLT 622* 687* 672* 681*    COAGS: Recent Labs    01/14/20 1042 01/15/20 1013 01/17/20 0439 02/06/20 1721  INR 3.5* 1.5* 1.5* 1.2    BMP: Recent Labs    02/02/20 0627 02/05/20 0500 02/08/20 0500 02/09/20 0325  NA 137 137 138 137  K 4.2 4.1 4.0 4.0  CL 112* 108 105 105  CO2 18* 23  25 24   GLUCOSE 131* 142* 158* 141*  BUN 32* 37* 40* 31*  CALCIUM 7.5* 7.7* 7.6* 7.4*  CREATININE 1.08* 1.06* 0.88 0.83  GFRNONAA 53* 54* >60 >60  GFRAA >60 >60 >60 >60    LIVER FUNCTION TESTS: Recent Labs    02/02/20 0627 02/05/20 0500 02/08/20 0500 02/09/20 0325  BILITOT 0.4 0.3 0.3 0.5  AST 24 19 20 27   ALT 21 20 21 23   ALKPHOS 82 90 86 93  PROT 4.1* 4.2* 4.3* 4.8*  ALBUMIN 1.1* <1.0* 1.1* 1.2*    Assessment and Plan: CT on 5/3 showed a 10.5 x 6.6 cm collection of fluid and gas concerning for Abscess. She is status post placement of a LUQ drain 02/07/20 by Dr. .  WBC down to 19.2 (was 35.8).  Continue routine drain care with flushes.  Recommend repeat CT scan when output is down to 10-15 mL per day and clearing..  Electronically Signed: 7/3, NP 02/09/2020, 9:57 AM   I spent a total of 15 Minutes at the the patient's bedside AND on the patient's hospital floor or unit, greater than 50% of which was counseling/coordinating care for LUQ drain.

## 2020-02-09 NOTE — Progress Notes (Signed)
Daily Rounding Note  02/09/2020, 10:26 AM  LOS: 27 days   SUBJECTIVE:   Chief complaint: Pink, blood tinged stool.     The blood-tinged to the stool has resolved.  Flexi-Seal was removed yesterday afternoon.  Patient has her usual chronic abdominal pain.  No nausea.  OBJECTIVE:         Vital signs in last 24 hours:    Temp:  [97.5 F (36.4 C)-98.2 F (36.8 C)] 98.1 F (36.7 C) (05/06 0822) Pulse Rate:  [81-90] 82 (05/06 0822) Resp:  [18-30] 19 (05/06 0822) BP: (101-127)/(49-74) 117/74 (05/06 0822) SpO2:  [92 %-100 %] 99 % (05/06 0822) Weight:  [60.8 kg] 60.8 kg (05/06 0410) Last BM Date: 02/08/20 Filed Weights   02/07/20 0340 02/08/20 0419 02/09/20 0410  Weight: 60.4 kg 61.2 kg 60.8 kg   General: Looks chronically unwell and malnourished.  Relatively comfortable.  PT in the room trying to get her involved in activities but patient not wanting to participate much. PT helped to turn the patient on her side.  She was incontinent of soft, unformed, brown stool.  There was no blood or pink tinge to it.  Intake/Output from previous day: 05/05 0701 - 05/06 0700 In: 902.5 [P.O.:480; I.V.:50; Blood:322.5; IV Piggyback:50] Out: 1032 [Drains:132; Stool:900]  Intake/Output this shift: Total I/O In: 200 [P.O.:200] Out: -   Lab Results: Recent Labs    02/08/20 0500 02/08/20 1504 02/09/20 0325  WBC 22.6* 23.5* 19.2*  HGB 6.8* 8.6* 9.4*  HCT 22.3* 27.1* 29.9*  PLT 687* 672* 681*   BMET Recent Labs    02/08/20 0500 02/09/20 0325  NA 138 137  K 4.0 4.0  CL 105 105  CO2 25 24  GLUCOSE 158* 141*  BUN 40* 31*  CREATININE 0.88 0.83  CALCIUM 7.6* 7.4*   LFT Recent Labs    02/08/20 0500 02/09/20 0325  PROT 4.3* 4.8*  ALBUMIN 1.1* 1.2*  AST 20 27  ALT 21 23  ALKPHOS 86 93  BILITOT 0.3 0.5   PT/INR Recent Labs    02/06/20 1721  LABPROT 14.9  INR 1.2   Hepatitis Panel No results for input(s): HEPBSAG,  HCVAB, HEPAIGM, HEPBIGM in the last 72 hours.  Studies/Results: CT SOFT TISSUE NECK WO CONTRAST  Result Date: 02/07/2020 CLINICAL DATA:  69 year old female with diffuse neck pain without known injury. Bilateral pain. Suspected splenic abscess on CT Abdomen and Pelvis yesterday. EXAM: CT NECK WITHOUT CONTRAST TECHNIQUE: Multidetector CT imaging of the neck was performed following the standard protocol without intravenous contrast. COMPARISON:  Head CT 01/19/2020. FINDINGS: Pharynx and larynx: The glottis is closed. Otherwise negative noncontrast larynx. Right side nasal enteric tube is in place and courses into the esophagus. Otherwise negative noncontrast pharynx. Negative parapharyngeal and retropharyngeal spaces. Salivary glands: Negative sublingual space. Noncontrast submandibular glands and parotid glands appear symmetric and negative. Thyroid: Diminutive, negative. Lymph nodes: No cervical lymphadenopathy is identified. Vascular: Vascular patency is not evaluated in the absence of IV contrast. Scattered calcified atherosclerosis. Limited intracranial: Stable, negative. Visualized orbits: Negative. Mastoids and visualized paranasal sinuses: Right side nasoenteric tube. Mild mucosal thickening in the paranasal sinuses has regressed since last month. Mild mastoid effusions are stable. Tympanic cavities appear clear. Skeleton: Largely absent dentition. Cervical spine facet degeneration. Severe left shoulder osseous degeneration. No acute or suspicious osseous lesion identified. Upper chest: Enteric tube continues in the thoracic esophagus which appears unremarkable. Left chest cardiac pacemaker type device. Calcified aortic  atherosclerosis. No superior mediastinal lymphadenopathy. Other: Centrilobular emphysema. Small to moderate left and small right layering pleural effusions. Partially visible calcified granulomas in the right lung. No axillary lymphadenopathy. IMPRESSION: 1. Negative noncontrast CT  appearance of the Neck aside from calcified carotid atherosclerosis. 2. Partially visible bilateral pleural effusions and Emphysema (ICD10-J43.9). 3. Right side nasoenteric tube in place, partially visible with no adverse features. 4. Aortic Atherosclerosis (ICD10-I70.0). Electronically Signed   By: Odessa Fleming M.D.   On: 02/07/2020 11:30   CT IMAGE GUIDED DRAINAGE PERCUT CATH  PERITONEAL RETROPERIT  Result Date: 02/07/2020 INDICATION: Splenectomy bed abscess by CT EXAM: CT drainage left upper quadrant splenectomy bed drain placement MEDICATIONS: The patient is currently admitted to the hospital and receiving intravenous antibiotics. The antibiotics were administered within an appropriate time frame prior to the initiation of the procedure. ANESTHESIA/SEDATION: Fentanyl 50 mcg IV; Versed 0.5 mg IV Moderate Sedation Time:  19 minutes The patient was continuously monitored during the procedure by the interventional radiology nurse under my direct supervision. COMPLICATIONS: None immediate. PROCEDURE: Informed written consent was obtained from the patient after a thorough discussion of the procedural risks, benefits and alternatives. All questions were addressed. Maximal Sterile Barrier Technique was utilized including caps, mask, sterile gowns, sterile gloves, sterile drape, hand hygiene and skin antiseptic. A timeout was performed prior to the initiation of the procedure. Previous imaging reviewed. Patient positioned left anterior oblique. Noncontrast localization CT performed. The left upper quadrant splenic bed abscess was localized and marked. Under sterile conditions and local anesthesia, an 18 gauge 10 cm access was advanced into the fluid collection. Needle position confirmed with CT. Syringe aspiration yielded bloody exudative malodorous fluid compatible with infected hematoma. Sample sent for culture. Guidewire inserted followed by tract dilatation to insert a 12 [place] drain. Drain catheter position confirmed  with CT. Catheter secured with a prolene suture and connected to external suction bulb. Sterile dressing applied. No immediate complication. Patient tolerated the procedure well. IMPRESSION: Successful CT-guided left upper quadrant splenic bed abscess drain placement as above. Electronically Signed   By: Judie Petit.  Shick M.D.   On: 02/07/2020 12:18   Scheduled Meds: . sodium chloride   Intravenous Once  . acetaminophen (TYLENOL) oral liquid 160 mg/5 mL  650 mg Per Tube Q6H  . Chlorhexidine Gluconate Cloth  6 each Topical Daily  . feeding supplement (ENSURE ENLIVE)  237 mL Oral TID BM  . feeding supplement (PRO-STAT SUGAR FREE 64)  30 mL Per Tube Daily  . feeding supplement (VITAL 1.5 CAL)  840 mL Per Tube Q24H  . free water  150 mL Per Tube Q4H  . geriatric multivitamins-minerals  15 mL Per Tube Daily  . HYDROmorphone HCl  24 mg Oral Q24H  . insulin aspart  0-6 Units Subcutaneous Q4H  . levothyroxine  50 mcg Per Tube Q0600  . lipase/protease/amylase  12,000 Units Oral TID AC  . magic mouthwash  5 mL Oral QID  . melatonin  3 mg Oral QHS  . pantoprazole sodium  40 mg Per Tube BID  . potassium chloride  40 mEq Oral Once  . pregabalin  75 mg Oral BID  . sodium chloride flush  10-40 mL Intracatheter Q12H  . sodium chloride flush  5 mL Intracatheter Q8H  . thiamine  100 mg Per Tube Daily  . traZODone  50 mg Oral QHS   Continuous Infusions: . piperacillin-tazobactam (ZOSYN)  IV 3.375 g (02/09/20 0611)   PRN Meds:.albuterol, Gerhardt's butt cream, guaiFENesin-dextromethorphan, HYDROmorphone, ipratropium-albuterol, loperamide  HCl, ondansetron (ZOFRAN) IV, oxyCODONE, Resource ThickenUp Clear, sodium chloride, sodium chloride flush Status Questran analysis ASSESMENT:   *   Blood-tinged stool beginning 5/5, resolved.  ?  Due to ulcer from Flexi-Seal.  Flexi-Seal removed 5/5. Diarrhea, C. difficile negative.  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  *  Anemia.  Hgb 6.8 >> 1  PRBC >> 9.4.     PLAN   *    No plans for colonoscopic or flex sig eval.  *    Add Questran bid.    *   GI signing off.  GI fup with Dr Okey Dupre at [facility].      Natasha Jacobson  02/09/2020, 10:26 AM Phone [phone]

## 2020-02-09 NOTE — Progress Notes (Signed)
Per order cortrack removed. Pt tolerated well. Pt currently resting in bed. Per orders called [phone] s/w Vince who placed a paged to [facility] to request ICD be programmed to off.   @ 1550 Byran came to bedside to turn off ICD. RN advise that patient has pacemaker and not ST Judes model he could not communicate with device.  @ 1605 RN notified Melaine, RN with Palliative Medicine team. RN was advise ok to leave pacemaker on.

## 2020-02-09 NOTE — Progress Notes (Signed)
Patient ID: Natasha Jacobson, female   DOB: 14-Mar-1951, 69 y.o.   MRN: [id number]  PROGRESS NOTE    Natasha Jacobson  WUJ:[id number] DOB: 14-Mar-1951 DOA: 01/13/2020 PCP: Ardyth GalYbanez, Jane, MD   Brief Narrative:  69 year old female with history of chronic alcoholic pancreatitis, status post a recent pancreat[facility], admitted here with acute GI bleed due to splenic artery pseudoaneurysm status post embolization. She presented to us with recurrent abdominal pain and hematemesis, and hospital course was complicated by hypotension and hemorrhagic shock requiring multiple PRBC transfusion, invasive mechanical ventilation as well as vasopressors.  GI was consulted and underwent an EGD which showed active upper GI bleeding, and a follow-up CT angiogram showed a 7 mm pseudoaneurysm over the pancreatic branch of the splenic artery.  IR was consulted and she underwent coil embolization of the pseudoaneurysm. She failed extubation on April 11 and had to be reintubated on April 12 due to increased work of breathing and respiratory distress, eventually being extubated.  Failed again extubation on April 16 and had to be reintubated April 19, she was diagnosed with left-sided pneumonia at that time and placed on antibiotics.  She was eventually extubated April 22nd, [facility]. Patient had persistent leukocytosis and abdominal pain, underwent a CT scan of the abdomen and pelvis on 4/24 which was fairly unremarkable however was without contrast due to slightly elevated creatinine.  Given persistent pain and leukocytosis she had a repeat CT scan on 5/3 with contrast which showed an intra-abdominal abscess for which IR was consulted.  She underwent CT-guided drainage of the abscess by IR on 02/07/2020.  She was started on Zosyn on 02/07/2020. She had some rectal leading on 02/08/2020 in her Flexi-Seal and GI was consulted again.  Assessment & Plan:  Acute on chronic alcoholic  pancreatitis, complicated with hemorrhagic shock and acute blood loss anemia due to upper GI bleed, POA -this is due to a splenic artery pseudoaneurysm, now status post embolization by IR on 4/20. -She has a chronic component of abdominal pain, however had persistent pain over the last several days, worsening, as well as significant leukocytosis. initial CT scan on 4/24 was without acute findings: it was without contrast.  Given stable renal function normal, she had a repeat CT scan on 5/30 which showed an intra-abdominal abscess.  She underwent CT-guided drainage of the abscess by IR on 02/07/2020.  She was started on Zosyn on 02/07/2020.  Follow drainage culture. -She also has persistent diarrhea, C. difficile was negative.  This is likely due to tube feeds. -continue keeping the cortrak in because she has little to no p.o. intake.  They also recommended a PEG tube however this is a national back order.  -Still complaining of severe abdominal pain.  She is already on oral Exalgo along with as needed oral Dilaudid as per palliative care team.  Continue as needed oxycodone for breakthrough. -Overall prognosis is guarded to poor.  I think she is appropriate for hospice/comfort measures.  Palliative care is planning to meet with patient and family again today.  If pain is still out of control, recommend continuous intravenous morphine/Dilaudid drip.    Leukocytosis -White count has improved to 19.2 from 23.5 yesterday.  Monitor.  Continue antibiotic as above.  Thrombocytosis -Most likely reactive.  Monitor  Rectal bleeding -Patient had some bleeding in the Flexi-Seal on 02/08/2020.  GI was reconsulted.  Conservative management for now.  Flexi-Seal has been removed.  No further  bleeding reported by nursing staff.  Monitor H&H.  Acute hypoxic respiratory failure due to left lower lobe aspiration pneumonia -now off mechanical ventilation, respiratory cultures showed MSSA and Enterobacter, status post  completed treatment with linezolid and cefepime. -Continue oxymetry monitoring and aspiration precautions, as needed bronchodilators and as tolerated airway clearing techniques with flutter valve and incentive spirometer. -Respiratory status is improved.  Currently on room air.  AKI with hypernatremia/ hypokalemia/ hyperchloremia/ hyperchloremic non gap metabolic acidosis -patient has had significant diarrhea, C. difficile was negative.  Creatinine has remained stable.   -Renal function has improved.  Acute on chronic anemia -Patient probably has anemia of chronic disease from prolonged hospitalization and chronic pancreatitis -Hemoglobin 9.4 this morning.  Status post 1 unit of packed red cell transfusion on 02/08/2020.  Monitor H&H  Hypomagnesemia -Replace.  Repeat a.m. labs  Acute metabolic encephalopathy -likely component of ICU delirium as well, continues to improve, alert and oriented x4 mentating well right now  Severe calorie protein malnutrition Hypoalbuminemia -Continue withNutritional supplementation,thiamine and multivitamins.  Received albumin 5/2.  She is severely malnourished continue tube feeds as she has little to no p.o. intake -Albumin is 1.2 this morning.  Hypothyroidism-Continuelevothyroxine  Diabetes mellitus type 2 -continue sliding scale.  CBGs stable.  A1c was 5.4  Generalized deconditioning -Overall prognosis is very poor.  Recommend hospice/comfort measures.   DVT prophylaxis: SCDs Code Status: DNR Family Communication: Spoke to patient at bedside Disposition Plan: Status is: Inpatient  Remains inpatient appropriate because: New findings of intra-abdominal abscess requiring IR drainage and IV antibiotics.  Still in significant abdominal pain. Still on cortrak feeding.  Had rectal bleeding yesterday.   Dispo: The patient is from: Home              Anticipated d/c is to: SNF              Anticipated d/c date is: > 3 days               Patient currently is not medically stable to d/c.    Consultants: Palliative care/critical care/IR/GI  Procedures:  4/9 admitted; mass transfusion protocol 4/10 transfer to ICU; EGD, IR embolization 4/11 extubated 4/12 white count remains elevated at 39k, LFTs significantly elevated; continues to have dark red stools; hgb stable; minimal responsiveness; reintubated for increased work of breathing 4/13 LFTs improving. hgb stable. More responsive this morning. Minimal improvement in respiratory status. Increased BRBPR>>EGD 4/14 LFTs, renal function improving. More encephalopathic this morning. hgb stable. 4/15continues to be severely encephalopathic>head CT neg; progressive hypernatremia 148>151 4/16 improved mental status, improved renal function; extubated; off pressors 4/17 remains encephalopathic 4/18 D/c to floor 4/19 Re-intubated and re-admitted to ICU 4/22 extubated 4/23 transferred to [facility] 4/24 CT abdomen pelvis without acute findings 5/3 CT abdomen pelvis left upper quadrant abscess  5/4 IR abscess drainage  Antimicrobials:  Anti-infectives (From admission, onward)   Start     Dose/Rate Route Frequency Ordered Stop   02/08/20 0000  piperacillin-tazobactam (ZOSYN) IVPB 3.375 g     3.375 g 12.5 mL/hr over 240 Minutes Intravenous Every 8 hours 02/07/20 2159     02/07/20 1530  piperacillin-tazobactam (ZOSYN) IVPB 3.375 g  Status:  Discontinued     3.375 g 12.5 mL/hr over 240 Minutes Intravenous Every 8 hours 02/07/20 1421 02/07/20 2159   01/26/20 0000  vancomycin (VANCOCIN) IVPB 1000 mg/200 mL premix  Status:  Discontinued     1,000 mg 200 mL/hr over 60 Minutes Intravenous Every 48 hours 01/24/20 0032  01/24/20 0934   01/25/20 1145  linezolid (ZYVOX) IVPB 600 mg  Status:  Discontinued     600 mg 300 mL/hr over 60 Minutes Intravenous Every 12 hours 01/25/20 1053 01/26/20 1044   01/24/20 0115  vancomycin (VANCOCIN) IVPB 1000 mg/200 mL premix     1,000 mg 200 mL/hr over 60  Minutes Intravenous  Once 01/24/20 0016 01/24/20 0254   01/23/20 2315  ceFEPIme (MAXIPIME) 2 g in sodium chloride 0.9 % 100 mL IVPB     2 g 200 mL/hr over 30 Minutes Intravenous Every 24 hours 01/23/20 2306 01/30/20 2359   01/19/20 1015  ampicillin (OMNIPEN) 2 g in sodium chloride 0.9 % 100 mL IVPB     2 g 300 mL/hr over 20 Minutes Intravenous Every 12 hours 01/19/20 0928 01/21/20 2131   01/18/20 2200  ampicillin (OMNIPEN) 1 g in sodium chloride 0.9 % 100 mL IVPB  Status:  Discontinued     1 g 300 mL/hr over 20 Minutes Intravenous Every 12 hours 01/18/20 1104 01/19/20 0928   01/16/20 1045  ampicillin (OMNIPEN) 1 g in sodium chloride 0.9 % 100 mL IVPB  Status:  Discontinued     1 g 300 mL/hr over 20 Minutes Intravenous Every 8 hours 01/16/20 0945 01/18/20 1104   01/14/20 0800  cefTRIAXone (ROCEPHIN) 2 g in sodium chloride 0.9 % 100 mL IVPB  Status:  Discontinued     2 g 200 mL/hr over 30 Minutes Intravenous Every 24 hours 01/14/20 0508 01/16/20 0945   01/13/20 2030  piperacillin-tazobactam (ZOSYN) IVPB 3.375 g     3.375 g 100 mL/hr over 30 Minutes Intravenous  Once 01/13/20 2017 01/13/20 2227       Subjective: Patient seen and examined at bedside.  She still complaining of severe intermittent abdominal pain.  Does not feel good.  Poor historian.  No overnight fever or vomiting reported.  As per nursing staff, toxicity was removed yesterday afternoon and no further rectal bleeding reported afterwards.  Objective: Vitals:   02/09/20 0002 02/09/20 0312 02/09/20 0321 02/09/20 0410  BP:  127/62    Pulse: 85 87 81   Resp: 20 (!) 26 20   Temp:  97.9 F (36.6 C)    TempSrc:  Oral    SpO2: 96% 95% 92%   Weight:    60.8 kg  Height:        Intake/Output Summary (Last 24 hours) at 02/09/2020 0711 Last data filed at 02/09/2020 6213 Gross per 24 hour  Intake 902.5 ml  Output 1032 ml  Net -129.5 ml   Filed Weights   02/07/20 0340 02/08/20 0419 02/09/20 0410  Weight: 60.4 kg 61.2 kg 60.8  kg    Examination:  General exam: Appears extremities deconditioned and ill looking.  Older than stated age.  Cortrak still present Respiratory system: Bilateral decreased breath sounds at bases with no wheezing but some crackles heard.  Intermittently tachypneic cardiovascular system: Rate controlled, S1-S2 heard  gastrointestinal system: Abdomen is nondistended, soft and tender diffusely.  Bowel sounds are heard Extremities: No edema or clubbing Central nervous system: Awake and alert.  No focal neurological deficits. Moving extremities. Skin: No ulcers or lesions Psychiatry: Looks very anxious   Data Reviewed: I have personally reviewed following labs and imaging studies  CBC: Recent Labs  Lab 02/05/20 0500 02/08/20 0500 02/08/20 1504 02/09/20 0325  WBC 35.3* 22.6* 23.5* 19.2*  NEUTROABS  --   --   --  15.4*  HGB 7.9* 6.8* 8.6* 9.4*  HCT 25.3* 22.3* 27.1* 29.9*  MCV 97.3 98.2 96.1 96.5  PLT 622* 687* 672* 681*   Basic Metabolic Panel: Recent Labs  Lab 02/05/20 0500 02/08/20 0500 02/09/20 0325  NA 137 138 137  K 4.1 4.0 4.0  CL 108 105 105  CO2 23 25 24   GLUCOSE 142* 158* 141*  BUN 37* 40* 31*  CREATININE 1.06* 0.88 0.83  CALCIUM 7.7* 7.6* 7.4*  MG 1.4* 1.5* 1.6*  PHOS 3.6 3.1  --    GFR: Estimated Creatinine Clearance: 56.8 mL/min (by C-G formula based on SCr of 0.83 mg/dL). Liver Function Tests: Recent Labs  Lab 02/05/20 0500 02/08/20 0500 02/09/20 0325  AST 19 20 27   ALT 20 21 23   ALKPHOS 90 86 93  BILITOT 0.3 0.3 0.5  PROT 4.2* 4.3* 4.8*  ALBUMIN <1.0* 1.1* 1.2*   No results for input(s): LIPASE, AMYLASE in the last 168 hours. No results for input(s): AMMONIA in the last 168 hours. Coagulation Profile: Recent Labs  Lab 02/06/20 1721  INR 1.2   Cardiac Enzymes: No results for input(s): CKTOTAL, CKMB, CKMBINDEX, TROPONINI in the last 168 hours. BNP (last 3 results) No results for input(s): PROBNP in the last 8760 hours. HbA1C: No results  for input(s): HGBA1C in the last 72 hours. CBG: Recent Labs  Lab 02/08/20 1106 02/08/20 1559 02/08/20 2040 02/08/20 2349 02/09/20 0311  GLUCAP 111* 94 117* 130* 143*   Lipid Profile: No results for input(s): CHOL, HDL, LDLCALC, TRIG, CHOLHDL, LDLDIRECT in the last 72 hours. Thyroid Function Tests: No results for input(s): TSH, T4TOTAL, FREET4, T3FREE, THYROIDAB in the last 72 hours. Anemia Panel: No results for input(s): VITAMINB12, FOLATE, FERRITIN, TIBC, IRON, RETICCTPCT in the last 72 hours. Sepsis Labs: No results for input(s): PROCALCITON, LATICACIDVEN in the last 168 hours.  Recent Results (from the past 240 hour(s))  Aerobic/Anaerobic Culture (surgical/deep wound)     Status: None (Preliminary result)   Collection Time: 02/07/20 11:12 AM   Specimen: Abscess  Result Value Ref Range Status   Specimen Description ABSCESS  Final   Special Requests Normal  Final   Gram Stain   Final    MODERATE WBC PRESENT, PREDOMINANTLY PMN ABUNDANT GRAM POSITIVE COCCI IN CHAINS ABUNDANT GRAM NEGATIVE RODS MODERATE GRAM POSITIVE RODS    Culture   Final    CULTURE REINCUBATED FOR BETTER GROWTH Performed at [facility], [address] [place], [facility] [address]    Report Status PENDING  Incomplete         Radiology Studies: CT SOFT TISSUE NECK WO CONTRAST  Result Date: 02/07/2020 CLINICAL DATA:  69 year old female with diffuse neck pain without known injury. Bilateral pain. Suspected splenic abscess on CT Abdomen and Pelvis yesterday. EXAM: CT NECK WITHOUT CONTRAST TECHNIQUE: Multidetector CT imaging of the neck was performed following the standard protocol without intravenous contrast. COMPARISON:  Head CT 01/19/2020. FINDINGS: Pharynx and larynx: The glottis is closed. Otherwise negative noncontrast larynx. Right side nasal enteric tube is in place and courses into the esophagus. Otherwise negative noncontrast pharynx. Negative parapharyngeal and retropharyngeal spaces.  Salivary glands: Negative sublingual space. Noncontrast submandibular glands and parotid glands appear symmetric and negative. Thyroid: Diminutive, negative. Lymph nodes: No cervical lymphadenopathy is identified. Vascular: Vascular patency is not evaluated in the absence of IV contrast. Scattered calcified atherosclerosis. Limited intracranial: Stable, negative. Visualized orbits: Negative. Mastoids and visualized paranasal sinuses: Right side nasoenteric tube. Mild mucosal thickening in the paranasal sinuses has regressed since last month. Mild mastoid  effusions are stable. Tympanic cavities appear clear. Skeleton: Largely absent dentition. Cervical spine facet degeneration. Severe left shoulder osseous degeneration. No acute or suspicious osseous lesion identified. Upper chest: Enteric tube continues in the thoracic esophagus which appears unremarkable. Left chest cardiac pacemaker type device. Calcified aortic atherosclerosis. No superior mediastinal lymphadenopathy. Other: Centrilobular emphysema. Small to moderate left and small right layering pleural effusions. Partially visible calcified granulomas in the right lung. No axillary lymphadenopathy. IMPRESSION: 1. Negative noncontrast CT appearance of the Neck aside from calcified carotid atherosclerosis. 2. Partially visible bilateral pleural effusions and Emphysema (ICD10-J43.9). 3. Right side nasoenteric tube in place, partially visible with no adverse features. 4. Aortic Atherosclerosis (ICD10-I70.0). Electronically Signed   By: Odessa Fleming M.D.   On: 02/07/2020 11:30   CT IMAGE GUIDED DRAINAGE PERCUT CATH  PERITONEAL RETROPERIT  Result Date: 02/07/2020 INDICATION: Splenectomy bed abscess by CT EXAM: CT drainage left upper quadrant splenectomy bed drain placement MEDICATIONS: The patient is currently admitted to the hospital and receiving intravenous antibiotics. The antibiotics were administered within an appropriate time frame prior to the initiation of the  procedure. ANESTHESIA/SEDATION: Fentanyl 50 mcg IV; Versed 0.5 mg IV Moderate Sedation Time:  19 minutes The patient was continuously monitored during the procedure by the interventional radiology nurse under my direct supervision. COMPLICATIONS: None immediate. PROCEDURE: Informed written consent was obtained from the patient after a thorough discussion of the procedural risks, benefits and alternatives. All questions were addressed. Maximal Sterile Barrier Technique was utilized including caps, mask, sterile gowns, sterile gloves, sterile drape, hand hygiene and skin antiseptic. A timeout was performed prior to the initiation of the procedure. Previous imaging reviewed. Patient positioned left anterior oblique. Noncontrast localization CT performed. The left upper quadrant splenic bed abscess was localized and marked. Under sterile conditions and local anesthesia, an 18 gauge 10 cm access was advanced into the fluid collection. Needle position confirmed with CT. Syringe aspiration yielded bloody exudative malodorous fluid compatible with infected hematoma. Sample sent for culture. Guidewire inserted followed by tract dilatation to insert a 12 [place] drain. Drain catheter position confirmed with CT. Catheter secured with a prolene suture and connected to external suction bulb. Sterile dressing applied. No immediate complication. Patient tolerated the procedure well. IMPRESSION: Successful CT-guided left upper quadrant splenic bed abscess drain placement as above. Electronically Signed   By: Judie Petit.  Shick M.D.   On: 02/07/2020 12:18        Scheduled Meds: . sodium chloride   Intravenous Once  . acetaminophen (TYLENOL) oral liquid 160 mg/5 mL  650 mg Per Tube Q6H  . Chlorhexidine Gluconate Cloth  6 each Topical Daily  . feeding supplement (ENSURE ENLIVE)  237 mL Oral TID BM  . feeding supplement (PRO-STAT SUGAR FREE 64)  30 mL Per Tube Daily  . feeding supplement (VITAL 1.5 CAL)  840 mL Per Tube Q24H  .  free water  150 mL Per Tube Q4H  . geriatric multivitamins-minerals  15 mL Per Tube Daily  . HYDROmorphone HCl  24 mg Oral Q24H  . insulin aspart  0-6 Units Subcutaneous Q4H  . levothyroxine  50 mcg Per Tube Q0600  . lipase/protease/amylase  12,000 Units Oral TID AC  . magic mouthwash  5 mL Oral QID  . melatonin  3 mg Oral QHS  . pantoprazole sodium  40 mg Per Tube BID  . potassium chloride  40 mEq Oral Once  . pregabalin  75 mg Oral BID  . sodium chloride flush  10-40 mL Intracatheter  Q12H  . sodium chloride flush  5 mL Intracatheter Q8H  . thiamine  100 mg Per Tube Daily  . traZODone  50 mg Oral QHS   Continuous Infusions: . piperacillin-tazobactam (ZOSYN)  IV 3.375 g (02/09/20 1937)          Glade Lloyd, MD [facility] 02/09/2020, 7:11 AM

## 2020-02-09 NOTE — Progress Notes (Signed)
Daily Progress Note   Patient Name: Natasha Jacobson       Date: 02/09/2020 DOB: 1950/12/12  Age: 68 y.o. MRN#: [id number] Attending Physician: Aline August, MD Primary Care Physician: Concepcion Elk, MD Admit Date: 01/13/2020  Reason for Consultation/Follow-up: Establishing goals of care  Subjective: Met with patient and her daughter [name]. As we begin patient states, "I think I'm dying". She repeatedly says she is tired. She doesn't want to participate with physical therapy. She now has an abdominal abcess, she is not eating or drinking. Her albumin is 1.2 today (was previously <1- likely a bump from her blood transfusion last evening) At this point I doubt she is absorbing much nutrition from her gut.  She doesn't want further hospitalizations and to go to a nursing facility. She clearly states at this point she is ready to transition to comfort care and Hospice. She has been in chronic pain for a very long time. This hospitalization has been complicated by one thing after another. Long discussion held regarding prognosis and what comfort care looks like. Erica at first was resistant, but after support from her mother and our continued discussion [name] is supportive of her mother's decision.  Myiah has chronic pain from pancreatitis, but is also having pain that is worse than her chronic pancreatitis pain that she has lived with for years. Given transition to full comfort we will work hard to control her pain- discussed continuous IV infusion of dilaudid with boluses and she asks if this can start "yesterday".  Jonay has a friend who went to the Hospice house in [facility] and that is where she would like to go as well.     Review of Systems  Constitutional: Positive for malaise/fatigue and weight loss.   HENT: Negative.   Eyes: Negative.   Respiratory: Positive for cough and shortness of breath.   Cardiovascular: Negative.   Gastrointestinal: Positive for abdominal pain, blood in stool, diarrhea and nausea.  Genitourinary: Negative.   Musculoskeletal: Positive for myalgias.  Skin: Negative.   Neurological: Negative.   Psychiatric/Behavioral: The patient is nervous/anxious.     Length of Stay: 27  Current Medications: Scheduled Meds:  . sodium chloride   Intravenous Once  . acetaminophen (TYLENOL) oral liquid 160 mg/5 mL  650 mg Per Tube Q6H  . Chlorhexidine Gluconate Cloth  6 each Topical Daily  . cholestyramine  4 g Oral BID  . lipase/protease/amylase  12,000 Units Oral TID AC  . magic mouthwash  5 mL Oral QID  . melatonin  3 mg Oral QHS  . potassium chloride  40 mEq Oral Once  . pregabalin  75 mg Oral BID  . traZODone  50 mg Oral QHS    Continuous Infusions: . HYDROmorphone    . piperacillin-tazobactam (ZOSYN)  IV 3.375 g (02/09/20 0611)    PRN Meds: albuterol, Gerhardt's butt cream, guaiFENesin-dextromethorphan, HYDROmorphone, loperamide HCl, ondansetron (ZOFRAN) IV, sodium chloride flush  Physical Exam Vitals and nursing note reviewed.  Constitutional:      Comments: cachetic  HENT:     Mouth/Throat:     Comments: Hoarse voice Cardiovascular:     Rate and Rhythm: Normal rate and regular rhythm.  Pulmonary:     Comments: Some shortness of breath Skin:    General: Skin is warm and dry.  Neurological:     Mental Status: She is alert and oriented to person, place, and time.     Motor: Weakness present.  Psychiatric:        Mood and Affect: Mood normal.        Behavior: Behavior normal.        Thought Content: Thought content normal.        Judgment: Judgment normal.             Vital Signs: BP 117/74 (BP Location: Left Wrist)   Pulse 82   Temp 98.1 F (36.7 C) (Oral)   Resp 19   Ht 5' 4" (1.626 m)   Wt 60.8 kg   SpO2 99%   BMI 23.01 kg/m  SpO2:  SpO2: 99 % O2 Device: O2 Device: Room Air O2 Flow Rate: O2 Flow Rate (L/min): 0 L/min  Intake/output summary:   Intake/Output Summary (Last 24 hours) at 02/09/2020 1153 Last data filed at 02/09/2020 0830 Gross per 24 hour  Intake 980 ml  Output 1032 ml  Net -52 ml   LBM: Last BM Date: 02/08/20 Baseline Weight: Weight: 50.7 kg Most recent weight: Weight: 60.8 kg       Palliative Assessment/Data: PPS: 20%    Flowsheet Rows     Most Recent Value  Intake Tab  Referral Department  Critical care  Unit at Time of Referral  ICU  Date Notified  01/24/20  Palliative Care Type  New Palliative care  Reason for referral  Clarify Goals of Care  Date of Admission  01/13/20  Date first seen by Palliative Care  01/25/20  # of days Palliative referral response time  1 Day(s)  # of days IP prior to Palliative referral  11  Clinical Assessment  Psychosocial & Spiritual Assessment  Palliative Care Outcomes      Patient Active Problem List   Diagnosis Date Noted  . Intra-abdominal abscess (HCC)   . Rectal bleeding   . Acute respiratory distress   . Palliative care encounter   . Protein-calorie malnutrition, severe 01/27/2020  . HAP (hospital-acquired pneumonia)   . Septic shock (HCC)   . Goals of care, counseling/discussion   . Palliative care by specialist   . DNR (do not resuscitate)   . Advanced care planning/counseling discussion   . Acute encephalopathy   . Hemorrhagic shock (HCC)   . AKI (acute kidney injury) (HCC)   . Anemia   . Essential hypertension 01/14/2020  . Acute pancreatitis 01/14/2020  . Gastrointestinal hemorrhage   .   Acute respiratory failure (HCC)   . Pancreatitis, acute 01/13/2020  . Generalized anxiety disorder 12/20/2019  . 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 Chronic pain syndrome 08/02/2018  . Chronic hepatitis C without hepatic coma (HCC) 08/18/2017  . Hypothyroidism 07/08/2016  . Hearing  loss, right 07/08/2016  . COPD (chronic obstructive pulmonary disease) (HCC) 09/06/2015  . SSS (sick sinus syndrome) (HCC) 09/06/2015    Palliative Care Assessment & Plan   Patient Profile: 68 y.o.femalewith past medical history of COPD,chronic Hep C,chronic pancreatitis, anxiety, chronic pain,pancreatic gastrostomy stent exchanged on 4/8admitted on 4/9/2021with hematemesis, red bloody bowel movements, abdominal pain, hypotension and low Hgb. Clinically worsened on 4/10 requiring intubation. 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 Urine + e. Faecalis. Evidence of possible renal infarct on CT scan. She was extubated 4/11, reintubated 4/12, extubated 4/16, and reintubated 4/19. Now with pneumonia and septic shock requiring vasopressors. Hypoalbumin at 1.3. Transaminases at creatinine are both trending up. She has remained encephalopathic during entire admission. Palliative medicine consulted for goals of care.5/4 with finding of splenic abcess requiring drain placement, significant drop in Hgb requiring blood transfusion. No with plan for focus on comfort.   Assessment/Recommendations/Plan  Abdominal abscess, acute on chronic pancreatitis, severe malnutrition- plan to transition to full comfort measures only TOC referal for placement at [facility] inpatient [facility] Start hydromorphone infusion .5mg/hr with 2 mg IV boluses Other comfort medications and interventions as ordered D/C meds, diagnostics, NG tube and other interventions that do not provide comfort  Goals of Care and Additional Recommendations: Limitations on Scope of Treatment: Full Comfort Care  Code Status: DNR  Prognosis:  < 2 weeks  Discharge Planning: Hospice facility  Care plan was discussed with patient, daughter, Dr. Alekh  Thank you  for allowing the Palliative Medicine Team to assist in the care of this patient.   Time In: 1030 Time Out: 1200 Total Time 90 minutes Prolonged Time Billed yes      Greater than 50%  of this time was spent counseling and coordinating care related to the above assessment and plan.   , AGNP-C Palliative Medicine   Please contact Palliative Medicine Team phone at [phone] for questions and concerns.        

## 2020-02-10 MED ORDER — LOPERAMIDE HCL 1 MG/7.5ML PO SUSP: 2.0000 mg | Freq: Three times a day (TID) | ORAL | Status: AC | PRN

## 2020-02-10 MED ORDER — POLYVINYL ALCOHOL 1.4 % OP SOLN: 1.0000 [drp] | Freq: Four times a day (QID) | OPHTHALMIC | Status: AC | PRN

## 2020-02-10 MED ORDER — LORAZEPAM 1 MG PO TABS: 1.0000 mg | ORAL_TABLET | ORAL | Status: AC | PRN

## 2020-02-10 MED ORDER — TRAZODONE HCL 50 MG PO TABS: 50.0000 mg | ORAL_TABLET | Freq: Every day | ORAL | Status: AC

## 2020-02-10 MED ORDER — HALOPERIDOL 0.5 MG PO TABS: 0.5000 mg | ORAL_TABLET | ORAL | Status: AC | PRN

## 2020-02-10 MED ORDER — PREGABALIN 75 MG PO CAPS: 75.0000 mg | ORAL_CAPSULE | Freq: Two times a day (BID) | ORAL | Status: AC

## 2020-02-10 NOTE — Progress Notes (Addendum)
RN notified by Ashley-CM/SW patient cleared to discharge. PTAR schedule for pick up at 1630. RN will await PTAR.   Called report to Gavin Pound, RN at [facility] [place] [facility]. RN provide detail report to [employer]. Midline will stay in place per Deborah's request.  Pt belongings in belonging bag, states "had a cellphone."  Pt says last seen yesterday. RN could not locate. Patient states "daughter may have taken with her yesterday."  RN made several attemptes to reach daughter by phone without being able to reach her.

## 2020-02-10 NOTE — Progress Notes (Signed)
[facility]: [employer]  TC to the daughter left VM to return call.  Have reviewed the chart and after discussing with daughter if they are in agreement with full comfort care at our facility we can proceed with transfer if MD feels she is able. Norm Parcel RN [phone]

## 2020-02-10 NOTE — Progress Notes (Signed)
This RN attempted to call Natasha Jacobson, daughter @ [phone] to get in touch with the hospice facility to aid in pt DC. Phone rang, no vm picked up. Will attempt again. Pt states she doesn't have another number to try.   Bedside RN will continue to monitor patient. Pt continues to pull nasal cannula to under her chin desaturating into the low 70s. Pt recovers well once oxygen replaced.

## 2020-02-10 NOTE — TOC Transition Note (Signed)
Transition of Care [facility]) - CM/SW Discharge Note   Patient Details  Name: Janayia Burggraf MRN: [id number] Date of Birth: [birth year]  Transition [facility]) CM/SW Contact:  Gildardo Griffes, LCSW Phone Number: 02/10/2020, 1:31 PM   Clinical Narrative:     Patient will DC to: [facility] Anticipated DC date: 02/10/20 Family notified:Ericka Transport TM:[id number]  Per MD patient ready for [facility]. RN, patient, patient's family, and facility notified of DC. Discharge Summary sent to facility.  DC packet on chart. Ambulance transport requested for patient for 4:30 pm.  CSW signing off.  [place], [place] [phone]   Final next level of care: Hospice Medical Facility Barriers to Discharge: No Barriers Identified   Patient Goals and CMS Choice Patient states their goals for this hospitalization and ongoing recovery are:: Daughter To get stronger CMS Medicare.gov Compare Post Acute Care list provided to:: Patient Represenative (must comment)(Ericka) Choice offered to / list presented to : Adult Children  Discharge Placement                Patient to be transferred to facility by: PTAR Name of family member notified: Ericka Patient and family notified of of transfer: 02/10/20  Discharge Plan and Services   Discharge Planning Services: Other - See comment(SNF)                                 Social Determinants of Health (SDOH) Interventions     Readmission Risk Interventions No flowsheet data found.

## 2020-02-10 NOTE — Progress Notes (Signed)
Patient discharged to [facility].  Transport provided via [employer]. Belongings including 2 chargers, hair ties, and electronic tablet transported with patient.  67mL from IV Dilaudid gtt wasted and witnessed by Jodean Lima, RN.

## 2020-02-10 NOTE — TOC Progression Note (Signed)
Transition of Care [facility]) - Progression Note    Patient Details  Name: Rilee Knoll MRN: [id number] Date of Birth: [birth year]  [facility]) CM/SW Contact  Gildardo Griffes, [place] Phone Number: 02/10/2020, 9:48 AM  Clinical Narrative:      CSW spoke with Cherice with [facility] she reports they do have a bed for patient today however they have been unable to reach the daughter to come sign paperwork, she has tried reaching her 3 times this morning. Cherice has also been calling into the room/floor if the daughter is present at the hospital.   Cherice will let CSW know once the daughter has been reached so patient can be discharged.  Expected Discharge Plan: Hospice Medical Facility Barriers to Discharge: Hospice Bed not available  Expected Discharge Plan and Services Expected Discharge Plan: Hospice Medical Facility   Discharge Planning Services: Other - See comment(SNF)   Living arrangements for the past 2 months: Single Family Home                                       Social Determinants of Health (SDOH) Interventions    Readmission Risk Interventions No flowsheet data found.

## 2020-02-10 NOTE — Progress Notes (Signed)
RN went into room to administer morning meds. Pt states " Im not taking anymore medicine only pain medication." RN attempted to educate pt. Pt states " Im only going to take pain medications."  RN will continue to monitor.

## 2020-02-10 NOTE — Discharge Summary (Signed)
Physician Discharge Summary  Natasha Jacobson EVO:[id number] DOB: [birth year] DOA: 01/13/2020  PCP: Ardyth Gal, MD  Admit date: 01/13/2020 Discharge date: 02/10/2020  Admitted From: Home Disposition: Residential hospice  Recommendations for Outpatient Follow-up:  1. Follow up with residential hospice at earliest convenience   Home Health: No Equipment/Devices: None  Discharge Condition: Poor  CODE STATUS: DNR Diet recommendation: Per comfort measures  Brief/Interim Summary: 69 year old female with history of chronic alcoholic pancreatitis, status post a recent pancreatico gastrostomy stent at [facility] on 01/12/20, admitted here with acute GI bleed due to splenic artery pseudoaneurysm status post embolization. She presented to [place] with recurrent abdominal pain and hematemesis, and hospital course was complicated by hypotension and hemorrhagic shock requiring multiple PRBC transfusion, invasive mechanical ventilation as well as vasopressors. GI was consulted and underwent an EGD which showed active upper GI bleeding, and a follow-up CT angiogram showed a 7 mm pseudoaneurysm over the pancreatic branch of the splenic artery. IR was consulted and she underwent coil embolization of the pseudoaneurysm. She failed extubation on April 11 and had to be reintubated on April 12 due to increased work of breathing and respiratory distress, eventually being extubated. Failed again extubation on April 16 and had to be reintubated April 19, she was diagnosed with left-sided pneumonia at that time and placed on antibiotics. She was eventually extubated April 22nd, transferred to [facility] on April 23rd. Patient had persistent leukocytosis and abdominal pain, underwent a CT scan of the abdomen and pelvis on 4/24 which was fairly unremarkable however was without contrast due to slightly elevated creatinine. Given persistent pain and leukocytosis she had a repeat CT scan on 5/3 with contrast which showed an  intra-abdominal abscess for which IR was consulted.  She underwent CT-guided drainage of the abscess by IR on 02/07/2020.  She was started on Zosyn on 02/07/2020. She had some rectal leading on 02/08/2020 in her Flexi-Seal and GI was consulted again.  Her hemoglobin stabilized after unit of blood transfusion on 02/08/2020.  GI recommended conservative management.  Because of overall very poor prognosis and worsening patient's condition, after discussion with palliative care, patient decided to pursue residential hospice.  Patient was started on Dilaudid drip.  She will be transferred to residential hospice once bed is available.  Discharge Diagnoses:   Acute on chronic alcoholic pancreatitis, complicated with hemorrhagic shock and acute blood loss anemia due to upper GI bleed, POA Acute hypoxic respiratory failure due to left lower lobe aspiration pneumonia Rectal bleeding Thrombocytosis Leukocytosis AKI with hypernatremia/ hypokalemia/ hyperchloremia/ hyperchloremic non gap metabolic acidosis Acute on chronic anemia Hypomagnesemia Acute metabolic encephalopathy Severe protein calorie malnutrition Hypoalbuminemia Hypothyroidism next diabetes mellitus type 2 Generalized deconditioning  Plan -As per the discussion above, she will be transferred to residential hospice once bed is available.  Overall prognosis is very poor.   Discharge Instructions   Allergies as of 02/10/2020      Reactions   Buprenorphine Nausea Only   Aspirin Nausea Only   GI upset GI upset      Medication List    STOP taking these medications   acetaminophen 500 MG tablet Commonly known as: TYLENOL   albuterol 108 (90 Base) MCG/ACT inhaler Commonly known as: VENTOLIN HFA   amLODipine 5 MG tablet Commonly known as: NORVASC   baclofen 10 MG tablet Commonly known as: LIORESAL   ergocalciferol 1.25 MG (50000 UT) capsule Commonly known as: VITAMIN D2   fenofibrate 48 MG tablet Commonly known as: TRICOR    gabapentin  300 MG capsule Commonly known as: NEURONTIN   levothyroxine 50 MCG tablet Commonly known as: SYNTHROID   Oxycodone HCl 10 MG Tabs   pantoprazole 40 MG tablet Commonly known as: PROTONIX   polyethylene glycol powder 17 GM/SCOOP powder Commonly known as: GLYCOLAX/MIRALAX   senna 8.6 MG Tabs tablet Commonly known as: SENOKOT   Spiriva Respimat 1.25 MCG/ACT Aers Generic drug: Tiotropium Bromide Monohydrate     TAKE these medications   Creon 24000-76000 units Cpep Generic drug: Pancrelipase (Lip-Prot-Amyl) Take 1 capsule by mouth 3 (three) times daily.   haloperidol 0.5 MG tablet Commonly known as: HALDOL Take 1 tablet (0.5 mg total) by mouth every 4 (four) hours as needed for agitation (or delirium).   loperamide HCl 1 MG/7.5ML suspension Commonly known as: IMODIUM Take 15 mLs (2 mg total) by mouth 3 (three) times daily as needed for diarrhea or loose stools.   LORazepam 1 MG tablet Commonly known as: ATIVAN Take 1 tablet (1 mg total) by mouth every 4 (four) hours as needed for anxiety.   Melatonin 5 MG Caps Take 2 capsules by mouth at bedtime.   polyvinyl alcohol 1.4 % ophthalmic solution Commonly known as: LIQUIFILM TEARS Place 1 drop into both eyes 4 (four) times daily as needed for dry eyes.   pregabalin 75 MG capsule Commonly known as: LYRICA Take 1 capsule (75 mg total) by mouth 2 (two) times daily.   traZODone 50 MG tablet Commonly known as: DESYREL Take 1 tablet (50 mg total) by mouth at bedtime.      Follow-up Information    Lappas, Wendall Papa, NP Follow up.   Specialty: Family Medicine Why: call to arrange follow up with your gastro NP or MD Dr Raylene Miyamoto MD Contact information: [address] [phone]; [employer] [place] [address] [phone]        Ned Grace, MD Follow up.   Specialty: Psychiatry Why: follow up pancreatitis and pancreatic stents.   Contact information: [id number]  [employer] [place] [address] [phone]          Allergies  Allergen Reactions  . Buprenorphine Nausea Only  . Aspirin Nausea Only    GI upset GI upset     Consultations:  Critical care/IR/GI/palliative care   Procedures/Studies: CT ABDOMEN WO CONTRAST  Result Date: 01/28/2020 CLINICAL DATA:  Acute on chronic pancreatitis EXAM: CT ABDOMEN WITHOUT CONTRAST TECHNIQUE: Multidetector CT imaging of the abdomen was performed following the standard protocol without IV contrast. COMPARISON:  01/14/2020 FINDINGS: Lower chest: There are small bilateral pleural effusions with compressive lower lobe atelectasis. Hepatobiliary: No focal liver abnormality is seen. No gallstones, gallbladder wall thickening, or biliary dilatation. Pancreas: Pancreatic parenchymal calcifications consistent with chronic calcific pancreatitis. Embolic coils are seen at the site of the previous pseudoaneurysm abutting the body of the pancreas. Pancreatic stent extends into the gastric lumen unchanged. Spleen: Normal in size without focal abnormality. Adrenals/Urinary Tract: No renal calculi. Fullness of the bilateral renal pelves without frank hydronephrosis. Unremarkable unenhanced appearance of the renal parenchyma. The adrenals are unremarkable. Stomach/Bowel: Enteric catheter extends into the proximal jejunum. There is no bowel obstruction or ileus. Continued gas fluid levels throughout the colon. Vascular/Lymphatic: Extensive atherosclerosis of the aorta. No pathologic adenopathy. Other: There is extensive body wall edema and mesenteric edema. Trace ascites is seen throughout the upper abdomen. No free intraperitoneal gas. Musculoskeletal: No acute or destructive bony lesions. IMPRESSION: 1. Exam limited by the lack of intravenous contrast.  2. Interval embolization of a splenic artery pseudoaneurysm abutting the pancreatic body. 3. Diffuse body wall and mesenteric edema, with trace upper abdominal  ascites. 4. Small bilateral pleural effusions with bibasilar atelectasis. 5. Sequela of chronic calcific pancreatitis. Electronically Signed   By: Sharlet Salina M.D.   On: 01/28/2020 22:16   DG Chest 1 View  Result Date: 01/29/2020 CLINICAL DATA:  Dyspnea EXAM: CHEST  1 VIEW COMPARISON:  01/24/2020 FINDINGS: Interval endotracheal extubation and removal of a right neck vascular catheter. Partially imaged enteric feeding tube remains in position. New or increased, layering right pleural effusion and no significant change in layering left pleural effusion with associated atelectasis or consolidation. Calcified, benign pulmonary nodules. Left chest multi lead pacer. IMPRESSION: 1. Interval endotracheal extubation and removal of a right neck vascular catheter. Partially imaged enteric feeding tube remains in position. 2. New or increased, layering right pleural effusion and no significant change in layering left pleural effusion with associated atelectasis or consolidation. Electronically Signed   By: Lauralyn Primes M.D.   On: 01/29/2020 13:53   DG Chest 2 View  Result Date: 01/13/2020 CLINICAL DATA:  Shortness of breath EXAM: CHEST - 2 VIEW COMPARISON:  06/16/2019 FINDINGS: Old granulomatous disease. Left pacer remains in place, unchanged. No confluent opacities or effusions. Heart is normal size. Aortic atherosclerosis. No acute bony abnormality. IMPRESSION: No acute cardiopulmonary disease. Old granulomatous disease. Electronically Signed   By: Charlett Nose M.D.   On: 01/13/2020 18:54   DG Abd 1 View  Result Date: 01/24/2020 CLINICAL DATA:  Nasogastric tube placement EXAM: ABDOMEN - 1 VIEW COMPARISON:  None. FINDINGS: Dobbhoff tube with the tip projecting over the jejunum. Pancreatic stent is present. There is no bowel dilatation to suggest obstruction. There is no evidence of pneumoperitoneum, portal venous gas or pneumatosis. There are no pathologic calcifications along the expected course of the ureters.  The osseous structures are unremarkable. IMPRESSION: Dobbhoff tube with the tip projecting over the jejunum. Electronically Signed   By: Elige Ko   On: 01/24/2020 12:33   CT HEAD WO CONTRAST  Result Date: 01/19/2020 CLINICAL DATA:  Encephalopathy EXAM: CT HEAD WITHOUT CONTRAST TECHNIQUE: Contiguous axial images were obtained from the base of the skull through the vertex without intravenous contrast. COMPARISON:  None. FINDINGS: Brain: No acute intracranial abnormality. Specifically, no hemorrhage, hydrocephalus, mass lesion, acute infarction, or significant intracranial injury. Mild age related volume loss. Vascular: No hyperdense vessel or unexpected calcification. Skull: No acute calvarial abnormality. Sinuses/Orbits: Mucosal thickening throughout the paranasal sinuses. Air-fluid level in the left maxillary sinus. Mastoid air cells clear. Orbital soft tissues unremarkable. Other: None IMPRESSION: No acute intracranial abnormality. Acute on chronic sinusitis. Electronically Signed   By: Charlett Nose M.D.   On: 01/19/2020 11:33   CT SOFT TISSUE NECK WO CONTRAST  Result Date: 02/07/2020 CLINICAL DATA:  69 year old female with diffuse neck pain without known injury. Bilateral pain. Suspected splenic abscess on CT Abdomen and Pelvis yesterday. EXAM: CT NECK WITHOUT CONTRAST TECHNIQUE: Multidetector CT imaging of the neck was performed following the standard protocol without intravenous contrast. COMPARISON:  Head CT 01/19/2020. FINDINGS: Pharynx and larynx: The glottis is closed. Otherwise negative noncontrast larynx. Right side nasal enteric tube is in place and courses into the esophagus. Otherwise negative noncontrast pharynx. Negative parapharyngeal and retropharyngeal spaces. Salivary glands: Negative sublingual space. Noncontrast submandibular glands and parotid glands appear symmetric and negative. Thyroid: Diminutive, negative. Lymph nodes: No cervical lymphadenopathy is identified. Vascular:  Vascular patency is not evaluated  in the absence of IV contrast. Scattered calcified atherosclerosis. Limited intracranial: Stable, negative. Visualized orbits: Negative. Mastoids and visualized paranasal sinuses: Right side nasoenteric tube. Mild mucosal thickening in the paranasal sinuses has regressed since last month. Mild mastoid effusions are stable. Tympanic cavities appear clear. Skeleton: Largely absent dentition. Cervical spine facet degeneration. Severe left shoulder osseous degeneration. No acute or suspicious osseous lesion identified. Upper chest: Enteric tube continues in the thoracic esophagus which appears unremarkable. Left chest cardiac pacemaker type device. Calcified aortic atherosclerosis. No superior mediastinal lymphadenopathy. Other: Centrilobular emphysema. Small to moderate left and small right layering pleural effusions. Partially visible calcified granulomas in the right lung. No axillary lymphadenopathy. IMPRESSION: 1. Negative noncontrast CT appearance of the Neck aside from calcified carotid atherosclerosis. 2. Partially visible bilateral pleural effusions and Emphysema (ICD10-J43.9). 3. Right side nasoenteric tube in place, partially visible with no adverse features. 4. Aortic Atherosclerosis (ICD10-I70.0). Electronically Signed   By: Odessa Fleming M.D.   On: 02/07/2020 11:30   CT ANGIO ABDOMEN W &/OR WO CONTRAST  Addendum Date: 01/14/2020   ADDENDUM REPORT: 01/14/2020 16:45 ADDENDUM: PRA, Bing Neighbors, states that Dr. Deanne Coffer has viewed the CT scan and is aware of the findings. Dr. Deanne Coffer is currently performing a procedure on this patient. Electronically Signed   By: Elberta Fortis M.D.   On: 01/14/2020 16:45   Result Date: 01/14/2020 CLINICAL DATA:  GI bleed. Recent abdominal distention with nausea and vomiting. History of chronic pancreatitis. Recent stent exchange. EXAM: CT ANGIOGRAPHY ABDOMEN TECHNIQUE: Multidetector CT imaging of the abdomen was performed using the standard  protocol during bolus administration of intravenous contrast. Multiplanar reconstructed images and MIPs were obtained and reviewed to evaluate the vascular anatomy. CONTRAST:  80mL OMNIPAQUE IOHEXOL 350 MG/ML SOLN COMPARISON:  01/13/2020 and 10/24/2019 FINDINGS: VASCULAR Aorta: Abdominal aorta is normal in caliber and demonstrates mild calcified plaque distally. Celiac: Celiac axis is patent. Pancreatic branch of the celiac demonstrates a 7 mm focal dilatation likely pseudoaneurysm. There is a 4 mm hyperdense focus of contrast approximately 1 cm to the left of the pseudoaneurysm as this hyperdense focus lies just inside the gastric wall with the stent crosses into the stomach. This hyperdense blush communicates with layering contrast over the dependent portion of the stomach likely acute hemorrhage. This area hemorrhage is larger on the delayed images. SMA: SMA is patent. Renals: Single bilateral renal arteries are patent with focal narrowing over the proximal 1 cm of the right renal artery. IMA: Patent diminutive IMA. Inflow: Calcified plaque over the visualized iliac arteries which are otherwise patent. Veins: Unremarkable. Review of the MIP images confirms the above findings. NON-VASCULAR Lower chest: Minimal bibasilar dependent atelectasis. Small amount right pleural fluid. Several calcified pulmonary granulomas. Remainder of the lung bases are unremarkable. Hepatobiliary: Liver and gallbladder are unremarkable. Mild periportal edema is present. Pancreas: Changes of chronic pancreatitis with calcifications throughout the pancreas. Patient stent is unchanged which has pigtail over the second portion the duodenum and crosses the pancreas and stomach wall with pigtail in the body of the stomach. Spleen: Unremarkable. Adrenals/Urinary Tract: Adrenal glands are normal. Kidneys are normal size and demonstrate a small right renal cyst unchanged. There are slight worsening patchy areas of low-attenuation throughout the  cortex which may be due to infarction. No hydronephrosis. Stomach/Bowel: Interval significant distention of the stomach containing heterogeneous density material with mottled air collection. This heterogeneous slight increased density is likely due to mixed hemorrhage. This slightly hyperdense material extends into the duodenum. The visualized proximal  jejunal loops are dilated measuring up to 3.8 cm. Mild diverticulosis of the colon. Colon is air and fluid-filled. Subtle changes which could reflect mild pneumatosis over the descending colon. Lymphatic: No adenopathy. Other: Mild amount of free peritoneal fluid is present which is new since the previous exam. No free peritoneal air. Musculoskeletal: Unchanged. IMPRESSION: VASCULAR 1. 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 There is evidence of acute hemorrhage occurring 1 cm to the left of this pseudoaneurysm just inside the gastric wall at the site of stent crossing into the stomach. 2. Normal caliber aorta with atherosclerotic disease. Aortic Atherosclerosis (ICD10-I70.0). 3. Patent bilateral renal arteries with focal narrowing of the proximal 1 cm of the right renal artery. NON-VASCULAR 1. Interval distension of the stomach and duodenum with heterogeneous slightly hyperdense material likely hemorrhage. Presumed site of this acute hemorrhage as described above. 2. Dilated visualized proximal small bowel loops. Air and fluid throughout the visualized colon with possible pneumatosis involving the descending colon. 3. Worsening patchy bilateral low attenuation areas over the renal cortex which may be due to infarction. 4.  Worsening free peritoneal fluid. 5.  Evidence of chronic pancreatitis. Ordering physician has been paged. Electronically Signed: By: Elberta Fortis M.D. On: 01/14/2020 16:33   CT ABDOMEN PELVIS W CONTRAST  Result Date: 02/06/2020 CLINICAL DATA:  Abdominal  abscess. EXAM: CT ABDOMEN AND PELVIS WITH CONTRAST TECHNIQUE: Multidetector CT imaging of the abdomen and pelvis was performed using the standard protocol following bolus administration of intravenous contrast. CONTRAST:  OMNIPAQUE IOHEXOL 300 MG/ML  SOLN COMPARISON:  January 28, 2020. FINDINGS: Lower chest: Moderate size bilateral pleural effusions are noted with adjacent subsegmental atelectasis. Hepatobiliary: No gallstones are noted. Mild intrahepatic and extrahepatic biliary dilatation is noted. The liver is unremarkable. Pancreas: Continued presence of pancreatic calcifications consistent with chronic pancreatitis. Pancreatic duct stent is noted and unchanged in position. Spleen: The spleen is not clearly visualized, but in its place is a 10.5 x 6.6 cm collection of fluid and gas concerning for abscess. Adrenals/Urinary Tract: Adrenal glands appear normal. Mild bilateral hydronephrosis is noted without definite ureteral dilatation or ureteral obstruction. No definite nephrolithiasis is noted. Urinary bladder is unremarkable. Stomach/Bowel: Status post appendectomy. Feeding tube is seen entering and passing through stomach and duodenum with distal tip in the expected position of the proximal jejunum. Stomach is otherwise unremarkable. Stool and fluid is noted throughout the colon, but no bowel obstruction is noted. Rectal tube is noted. Vascular/Lymphatic: Aortic atherosclerosis. No enlarged abdominal or pelvic lymph nodes. Reproductive: Status post hysterectomy. No adnexal masses. Other: Moderate anasarca is noted. Minimal free fluid is noted in the pelvis. Musculoskeletal: No acute or significant osseous findings. IMPRESSION: Spleen is not clearly visualized, but in the splenic bed there is noted a 10.5 x 6.6 cm collection of fluid and gas concerning for abscess. Moderate size bilateral pleural effusions are noted with adjacent subsegmental atelectasis. Continued presence of pancreatic calcifications  consistent with chronic pancreatitis. Pancreatic duct stent is noted and unchanged in position. Moderate anasarca is noted. Mild bilateral hydronephrosis is noted without definite ureteral dilatation or ureteral obstruction. Aortic Atherosclerosis (ICD10-I70.0). Electronically Signed   By: Lupita Raider M.D.   On: 02/06/2020 14:15   CT ABDOMEN PELVIS W CONTRAST  Result Date: 01/13/2020 CLINICAL DATA:  69 year old female with abdominal distention, nausea vomiting. History of chronic pancreatitis. Status post stent exchange. EXAM: CT ABDOMEN AND PELVIS  WITH CONTRAST TECHNIQUE: Multidetector CT imaging of the abdomen and pelvis was performed using the standard protocol following bolus administration of intravenous contrast. CONTRAST:  38mL OMNIPAQUE IOHEXOL 300 MG/ML  SOLN COMPARISON:  CT abdomen pelvis dated 10/24/2019. FINDINGS: Lower chest: There is mild emphysematous changes of the visualized lung bases. Right lung base linear atelectasis/scarring. There is a small right middle lobe granuloma. Pacemaker wires noted. There is no intra-abdominal free air or free fluid. Hepatobiliary: The liver is unremarkable. No intrahepatic biliary ductal dilatation. The gallbladder is unremarkable. Pancreas: There is diffuse coarse calcification of the pancreas with moderate atrophy of the distal gland sequela of chronic pancreatitis. Mild peripancreatic haziness noted. Correlation with pancreatic enzymes recommended to exclude recurrent acute pancreatitis. A pancreatic stent noted with pigtail ends in the second portion of the duodenum and in the body of the stomach. No peripancreatic fluid collection or pseudocyst. Spleen: Normal in size without focal abnormality. Adrenals/Urinary Tract: The adrenal glands are unremarkable. Which shaped areas of hypoenhancement in the kidneys bilaterally, left greater right concerning for areas of renal parenchyma infarct. These are new since the prior CT. Correlation with urinalysis  recommended to exclude pyelonephritis. There is a 1 cm right renal interpolar cyst. There is no hydronephrosis on either side. The visualized ureters and urinary bladder appear unremarkable. Stomach/Bowel: The stomach is distended with ingested content. There is moderate stool throughout the colon. There is no bowel obstruction or active inflammation. Appendectomy. Vascular/Lymphatic: There is advanced aortoiliac atherosclerotic disease. There is a 2.3 cm infrarenal aortic ectasia. The IVC is unremarkable. No portal venous gas. There is no adenopathy. Reproductive: Hysterectomy. No adnexal masses. Other: None Musculoskeletal: Degenerative changes primarily at L4-L5. No acute osseous pathology. IMPRESSION: 1. Mild peripancreatic haziness may represent acute on chronic pancreatitis. Correlation with pancreatic enzymes recommended. A pancreatic stent with pigtail ends in the second portion of the duodenum and in the body of the stomach noted. No peripancreatic fluid collection or pseudocyst. 2. Bilateral renal parenchyma wedge-shaped infarcts, new since the prior CT. Correlation with urinalysis recommended to exclude pyelonephritis. 3. No bowel obstruction. 4. Aortic Atherosclerosis (ICD10-I70.0). Electronically Signed   By: Anner Crete M.D.   On: 01/13/2020 19:05   IR Angiogram Visceral Selective  Result Date: 01/15/2020 CLINICAL DATA:  GI bleed. 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 EXAM: SELECTIVE VISCERAL ARTERIOGRAPHY; ADDITIONAL ARTERIOGRAPHY; IR ULTRASOUND GUIDANCE VASC ACCESS RIGHT; IR EMBO ART VEN HEMORR LYMPH EXTRAV INC GUIDE ROADMAPPING ANESTHESIA/SEDATION: Patient was intubated and receiving IV fentanyl MEDICATIONS: Lidocaine 1% subcutaneous CONTRAST:  Omnipaque 300 IA PROCEDURE: The procedure was performed with implied emergent consent. Right femoral region prepped and draped in usual sterile fashion. Maximal barrier sterile technique was utilized including caps,  mask, sterile gowns, sterile gloves, sterile drape, hand hygiene and skin antiseptic. The right common femoral artery was localized under ultrasound. Under real-time ultrasound guidance, the vessel was accessed with a 21-gauge micropuncture needle, exchanged over a 018 guidewire for a transitional dilator, through which a 035 guidewire was advanced. Over this, a 5 [place] vascular sheath was placed, through which a 5 [place] C2 catheter was advanced and used to selectively catheterize the celiac axis for selective arteriography. This was exchanged for a 5 [place] Sos catheter. Through this, a coaxial microcatheter with 016 guidewire were advanced across the lesion in the mid splenic artery. Coil embolization immediately distal to the lesion performed with 2, 3, and 4 mm interlock coils. Separately, 5 mm coils x2 placed in the pseudoaneurysm. Separately, 2, 3, and  4 mm interlock coils were used in the native splenic artery immediately proximal to the lesion. Hemostasis of flow was achieved. Follow-up arteriogram was performed. The catheter was removed. Because of coagulopathy, sheath was secured with 0 silk suture and placed to flush system. The patient tolerated the procedure well. 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 Technically successful coil embolization of the splenic artery immediately distal and proximal to the lesion. IMPRESSION: 1. 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 Electronically Signed   By: Corlis Leak M.D.   On: 01/15/2020 09:49   IR Angiogram Selective Each Additional Vessel  Result Date: 01/15/2020 CLINICAL DATA:  GI bleed. 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 EXAM: SELECTIVE VISCERAL ARTERIOGRAPHY; ADDITIONAL ARTERIOGRAPHY; IR ULTRASOUND GUIDANCE VASC ACCESS RIGHT; IR EMBO ART VEN  HEMORR LYMPH EXTRAV INC GUIDE ROADMAPPING ANESTHESIA/SEDATION: Patient was intubated and receiving IV fentanyl MEDICATIONS: Lidocaine 1% subcutaneous CONTRAST:  Omnipaque 300 IA PROCEDURE: The procedure was performed with implied emergent consent. Right femoral region prepped and draped in usual sterile fashion. Maximal barrier sterile technique was utilized including caps, mask, sterile gowns, sterile gloves, sterile drape, hand hygiene and skin antiseptic. The right common femoral artery was localized under ultrasound. Under real-time ultrasound guidance, the vessel was accessed with a 21-gauge micropuncture needle, exchanged over a 018 guidewire for a transitional dilator, through which a 035 guidewire was advanced. Over this, a 5 [place] vascular sheath was placed, through which a 5 [place] C2 catheter was advanced and used to selectively catheterize the celiac axis for selective arteriography. This was exchanged for a 5 [place] Sos catheter. Through this, a coaxial microcatheter with 016 guidewire were advanced across the lesion in the mid splenic artery. Coil embolization immediately distal to the lesion performed with 2, 3, and 4 mm interlock coils. Separately, 5 mm coils x2 placed in the pseudoaneurysm. Separately, 2, 3, and 4 mm interlock coils were used in the native splenic artery immediately proximal to the lesion. Hemostasis of flow was achieved. Follow-up arteriogram was performed. The catheter was removed. Because of coagulopathy, sheath was secured with 0 silk suture and placed to flush system. The patient tolerated the procedure well. 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 Technically successful coil embolization of the splenic artery immediately distal and proximal to the lesion. IMPRESSION: 1. Technically successful localization and embolization of bleeding pseudoaneurysm in the mid  splenic artery. Electronically Signed   By: Corlis Leak M.D.   On: 01/15/2020 09:49   IR US Guide Vasc Access Right  Result Date: 01/15/2020 CLINICAL DATA:  GI bleed. 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 EXAM: SELECTIVE VISCERAL ARTERIOGRAPHY; ADDITIONAL ARTERIOGRAPHY; IR ULTRASOUND GUIDANCE VASC ACCESS RIGHT; IR EMBO ART VEN HEMORR LYMPH EXTRAV INC GUIDE ROADMAPPING ANESTHESIA/SEDATION: Patient was intubated and receiving IV fentanyl MEDICATIONS: Lidocaine 1% subcutaneous CONTRAST:  Omnipaque 300 IA PROCEDURE: The procedure was performed with implied emergent consent. Right femoral region prepped and draped in usual sterile fashion. Maximal barrier sterile technique was utilized including caps, mask, sterile gowns, sterile gloves, sterile drape, hand hygiene and skin antiseptic. The right common femoral artery was localized under ultrasound. Under real-time ultrasound guidance, the vessel was accessed with a 21-gauge micropuncture needle, exchanged over a 018 guidewire for a transitional dilator, through which a 035 guidewire was advanced. Over this, a  5 [place] vascular sheath was placed, through which a 5 [place] C2 catheter was advanced and used to selectively catheterize the celiac axis for selective arteriography. This was exchanged for a 5 [place] Sos catheter. Through this, a coaxial microcatheter with 016 guidewire were advanced across the lesion in the mid splenic artery. Coil embolization immediately distal to the lesion performed with 2, 3, and 4 mm interlock coils. Separately, 5 mm coils x2 placed in the pseudoaneurysm. Separately, 2, 3, and 4 mm interlock coils were used in the native splenic artery immediately proximal to the lesion. Hemostasis of flow was achieved. Follow-up arteriogram was performed. The catheter was removed. Because of coagulopathy, sheath was secured with 0 silk suture and placed to flush system. The patient tolerated the procedure well.  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 Technically successful coil embolization of the splenic artery immediately distal and proximal to the lesion. IMPRESSION: 1. 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 Electronically Signed   By: Corlis Leak M.D.   On: 01/15/2020 09:49   DG Chest Port 1 View  Result Date: 01/24/2020 CLINICAL DATA:  ET tube, COPD EXAM: PORTABLE CHEST 1 VIEW COMPARISON:  01/23/2020 FINDINGS: Endotracheal tube tip is 3.7 cm above the carina. Left pacer remains in place, unchanged. Cardiomegaly. Increasing left lung airspace disease. Calcified granulomas in the lungs bilaterally. Underlying COPD. IMPRESSION: Endotracheal tube 3.7 cm above the carina. Airspace disease worsening throughout the left lung, most confluent in the left lung base concerning for pneumonia. Cardiomegaly, COPD. Electronically Signed   By: Charlett Nose M.D.   On: 01/24/2020 00:49   DG Chest Port 1 View  Result Date: 01/23/2020 CLINICAL DATA:  Shortness of breath EXAM: PORTABLE CHEST 1 VIEW COMPARISON:  01/22/2020 FINDINGS: Left pacer remains in place, unchanged. There is hyperinflation of the lungs compatible with COPD. Heart is normal size. Aortic atherosclerosis. Calcified granulomas in the lungs bilaterally. Bibasilar atelectasis or scarring, similar to prior study. Possible small effusions bilaterally. No acute bony abnormality. IMPRESSION: Old granulomatous disease. Bibasilar scarring or atelectasis. Possible small bilateral effusions. Emphysema. Electronically Signed   By: Charlett Nose M.D.   On: 01/23/2020 22:55   DG Chest Port 1 View  Result Date: 01/22/2020 CLINICAL DATA:  Dyspnea. EXAM: PORTABLE CHEST 1 VIEW COMPARISON:  January 21, 2020. FINDINGS: The heart size and mediastinal contours are within normal limits. Feeding tube is seen  entering the stomach. Left-sided pacemaker is unchanged in position. Right internal jugular catheter is unchanged. No pneumothorax is noted. Calcified splenic granulomata are noted bilaterally. Mild bibasilar subsegmental atelectasis is noted with possible small left pleural effusion. The visualized skeletal structures are unremarkable. IMPRESSION: Mild bibasilar subsegmental atelectasis is noted with possible small left pleural effusion. Aortic Atherosclerosis (ICD10-I70.0). Electronically Signed   By: Lupita Raider M.D.   On: 01/22/2020 12:52   DG CHEST PORT 1 VIEW  Result Date: 01/21/2020 CLINICAL DATA:  Hypoxia, acute pancreatitis EXAM: PORTABLE CHEST 1 VIEW COMPARISON:  01/19/2020 chest radiograph. FINDINGS: Enteric tube enters stomach with the tip not seen on this image. 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 Stable cardiomediastinal silhouette with normal heart size. No pneumothorax. Small bilateral pleural effusions are similar. No pulmonary edema. Stable small calcified granulomas scattered in the right lung. Hazy left lung base opacity is similar. IMPRESSION: 1. Small bilateral pleural effusions, similar. 2. Stable hazy left lung base opacity,  favor atelectasis. Electronically Signed   By: Delbert Phenix M.D.   On: 01/21/2020 13:20   DG CHEST PORT 1 VIEW  Result Date: 01/19/2020 CLINICAL DATA:  Altered mental status. Bilateral lower extremity swelling. EXAM: PORTABLE CHEST 1 VIEW COMPARISON:  January 17, 2020. FINDINGS: The heart size and mediastinal contours are within normal limits. Endotracheal and nasogastric tubes are unchanged in position. Right internal jugular catheter is unchanged. Left-sided pacemaker is noted. Mild bibasilar subsegmental atelectasis is noted with possible small left pleural effusion. The visualized skeletal structures are unremarkable. IMPRESSION: Stable support apparatus. Mild bibasilar subsegmental atelectasis is noted with possible small left pleural  effusion. Aortic Atherosclerosis (ICD10-I70.0). Electronically Signed   By: Lupita Raider M.D.   On: 01/19/2020 10:17   DG Chest Port 1 View  Result Date: 01/17/2020 CLINICAL DATA:  69 year old female with history of acute respiratory failure. EXAM: PORTABLE CHEST 1 VIEW COMPARISON:  Chest x-ray 01/16/2020. FINDINGS: An endotracheal tube is in place with tip 3.4 cm above the carina. There is a right-sided internal jugular central venous catheter with tip terminating in the distal superior vena cava. Left-sided pacemaker device in place with lead tips projecting over the expected location of the right atrium and right ventricle. A nasogastric tube is seen extending into the stomach, however, the tip of the nasogastric tube extends below the lower margin of the image. Lung volumes are low. Bibasilar opacities are noted, favored to reflect areas of subsegmental atelectasis, although underlying airspace consolidation in the medial aspect of the left lower lobe is not excluded. Small left pleural effusion. No definite right pleural effusion. No evidence of pulmonary edema. No pneumothorax. Several small calcified granulomas are noted in the lungs bilaterally. No other suspicious appearing pulmonary nodules or masses are noted. Heart size is normal. Upper mediastinal contours are within normal limits. Aortic atherosclerosis. IMPRESSION: 1. Support apparatus, as above. 2. Low lung volumes with probable bibasilar subsegmental atelectasis and small left pleural effusion. 3. Aortic atherosclerosis. Electronically Signed   By: Trudie Reed M.D.   On: 01/17/2020 06:29   Portable Chest x-ray  Result Date: 01/16/2020 CLINICAL DATA:  Intubation EXAM: PORTABLE CHEST 1 VIEW COMPARISON:  01/15/2020 FINDINGS: Interval placement of esophagogastric tube, tip and side port below the diaphragm. Interval advancement of endotracheal tube, tip positioned above the carina. Right neck vascular catheter remains in position.  Unchanged mild, diffuse interstitial opacity and possible small layering pleural effusions. No new or focal airspace opacity. IMPRESSION: 1. Interval placement of esophagogastric tube, tip and side port below the diaphragm. 2. Interval advancement of endotracheal tube, tip positioned above the carina. Electronically Signed   By: Lauralyn Primes M.D.   On: 01/16/2020 15:54   DG CHEST PORT 1 VIEW  Result Date: 01/15/2020 CLINICAL DATA:  ETT placement. EXAM: PORTABLE CHEST 1 VIEW COMPARISON:  January 14, 2020 FINDINGS: The ETT is in good position. Stable pacemaker. The heart, hila, mediastinum are normal. A right central line is again identified. No pneumothorax. Mild interstitial prominence may represent pulmonary venous congestion. No overt edema. Mild bibasilar atelectasis. No suspicious infiltrates. IMPRESSION: 1. Support apparatus as above. 2. Suggested mild pulmonary venous congestion. Electronically Signed   By: Gerome Sam III M.D   On: 01/15/2020 11:24   Portable Chest x-ray  Result Date: 01/14/2020 CLINICAL DATA:  Status post ET tube placement. EXAM: PORTABLE CHEST 1 VIEW COMPARISON:  Multiple x-rays since Feb 15, 2016 FINDINGS: The ETT is in good position terminating in the mid trachea. A right  IJ terminates in the central SVC. No pneumothorax. Bilateral calcified granulomas are seen in the lungs. No suspicious nodules or masses. No focal infiltrates. The heart, hila, mediastinum are normal. A stable pacemaker is identified. IMPRESSION: The ETT is in good position. The right IJ is in good position with no pneumothorax. No acute abnormalities. Electronically Signed   By: Gerome Sam III M.D   On: 01/14/2020 11:59   DG Abd Portable 1V  Result Date: 01/16/2020 CLINICAL DATA:  Nasogastric tube placement EXAM: PORTABLE ABDOMEN - 1 VIEW COMPARISON:  CT abdomen and pelvis January 13, 2020 FINDINGS: Nasogastric tube tip and side port in stomach. Stomach appears to contain extensive food material. No  bowel dilatation or air-fluid level to suggest bowel obstruction. No free air. Pancreatic stent again noted. Calcified granulomas in visualized portions of lungs. IMPRESSION: Nasogastric tube tip and side port in stomach. No bowel dilatation or free air evident. Extensive apparent food material in stomach. Pancreatic stent present. Electronically Signed   By: Bretta Bang III M.D.   On: 01/16/2020 11:55   DG Swallowing Func-Speech Pathology  Result Date: 01/30/2020 Objective Swallowing Evaluation: Type of Study: MBS-Modified Barium Swallow Study  Patient Details Name: Bradee Common MRN: [id number] Date of Birth: [birth year] Today's Date: 01/30/2020 Time: SLP Start Time (ACUTE ONLY): 1140 -SLP Stop Time (ACUTE ONLY): 1200 SLP Time Calculation (min) (ACUTE ONLY): 20 min Past Medical History: Past Medical History: Diagnosis Date . 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 Alcohol-induced chronic pancreatitis (HCC)  . Brittle bone disease  . Chronic pain syndrome  . COPD (chronic obstructive pulmonary disease) (HCC)  . Hearing loss, right 07/08/2016 . Hepatitis C   Eradicated with antiviral treatment per patient . High cholesterol  . Hypertension  . Hypopotassemia  . Hypothyroidism 07/08/2016 . Prediabetes  . SSS (sick sinus syndrome) (HCC) 09/06/2015 . Thyroid disease  . Vitamin D deficiency  Past Surgical History: Past Surgical History: Procedure Laterality Date . ABDOMINAL HYSTERECTOMY   . APPENDECTOMY   . ERCP    multiple with stents placed  . ESOPHAGOGASTRODUODENOSCOPY N/A 01/17/2020  Procedure: ESOPHAGOGASTRODUODENOSCOPY (EGD);  Surgeon: Lemar Lofty., MD;  Location[facility] ENDOSCOPY;  Service: Gastroenterology;  Laterality: N/A; . ESOPHAGOGASTRODUODENOSCOPY (EGD) WITH PROPOFOL N/A 01/14/2020  Procedure: ESOPHAGOGASTRODUODENOSCOPY (EGD) WITH PROPOFOL;  Surgeon: Benancio Deeds, MD;  Location: [facility] ENDOSCOPY;  Service: Gastroenterology;  Laterality: N/A; . FOREIGN  BODY REMOVAL  01/17/2020  Procedure: FOREIGN BODY REMOVAL;  Surgeon: Meridee Score Netty Starring., MD;  Location: [facility] ENDOSCOPY;  Service: Gastroenterology;;  gastric lavage . IR ANGIOGRAM SELECTIVE EACH ADDITIONAL VESSEL  01/15/2020 . IR ANGIOGRAM VISCERAL SELECTIVE  01/15/2020 . IR EMBO ART  VEN HEMORR LYMPH EXTRAV  INC GUIDE ROADMAPPING  01/15/2020 . IR US GUIDE VASC ACCESS RIGHT  01/15/2020 . PACEMAKER IMPLANT   . TONSILLECTOMY   HPI: Pt is a 69 yo female admitted with hemorrhagic shock 2/2 bleeding 7mm pseudoaneurysm and recent instrumentation s/p EGD and coil embolization by IR 4/10. Course also significant for UTI, acute on chronic pancreatitis, and ETT 4/10-4/11; reintubated for encephalopathy and hypoxia 4/12-4/16. BSE 4/17 recommended NPO. Pt reintubated 4/19-4/22 due to respiratory distress. PMH also includes: COPD, hearing loss, HTN, thyroid disease.  Subjective: deconditioned, saying she can't cough, doesn't want to eat/drink Assessment / Plan / Recommendation CHL IP CLINICAL IMPRESSIONS 01/30/2020 Clinical Impression Pt presents as very restless and poorly pariticpatory during MBS. Needed max cues by end of session and instances of oral holding were prevalent throughout.  Suspect that pts pain and anxiety impacted her breathing pattern and PO trials contributed to her respiratory fatigue. Pt held bolus for several seconds at time while trying to catch her breath with occasional piecemealing of overlarge boluses. Though there were instances of premature spillage, swallow initaition was timely and strong. Primary problem was mild oral/base of tongue residue that spilled to vestibule and settled on vocal folds post swallow without sensation with large sips of thin liquids. Pt did improve with smaller bolus size via cup, but further strategies were not attempted as pt became increasingly restless. She tolerated nectar well but never swallowed her puree bolus. Recommed initiating a dys 1 (puree)/nectar thick liquid diet  with potential to upgrade if endurance improves. Will follow for futher trials.  SLP Visit Diagnosis Dysphagia, unspecified (R13.10) Attention and concentration deficit following -- Frontal lobe and executive function deficit following -- Impact on safety and function Mild aspiration risk   CHL IP TREATMENT RECOMMENDATION 01/30/2020 Treatment Recommendations Therapy as outlined in treatment plan below   Prognosis 01/27/2020 Prognosis for Safe Diet Advancement (No Data) Barriers to Reach Goals Cognitive deficits;Behavior Barriers/Prognosis Comment -- CHL IP DIET RECOMMENDATION 01/30/2020 SLP Diet Recommendations Dysphagia 1 (Puree) solids;Nectar thick liquid Liquid Administration via Cup;Straw Medication Administration Crushed with puree Compensations Clear throat intermittently Postural Changes --   CHL IP OTHER RECOMMENDATIONS 01/27/2020 Recommended Consults -- Oral Care Recommendations -- Other Recommendations Have oral suction available   CHL IP FOLLOW UP RECOMMENDATIONS 01/30/2020 Follow up Recommendations Skilled Nursing facility   [facility] IP FREQUENCY AND DURATION 01/30/2020 Speech Therapy Frequency (ACUTE ONLY) min 2x/week Treatment Duration 2 weeks      CHL IP ORAL PHASE 01/30/2020 Oral Phase Impaired Oral - Pudding Teaspoon -- Oral - Pudding Cup -- Oral - Honey Teaspoon -- Oral - Honey Cup -- Oral - Nectar Teaspoon -- Oral - Nectar Cup -- Oral - Nectar Straw Delayed oral transit;Holding of bolus;Lingual pumping Oral - Thin Teaspoon -- Oral - Thin Cup Delayed oral transit;Holding of bolus;Lingual pumping Oral - Thin Straw Delayed oral transit;Holding of bolus;Lingual pumping Oral - Puree Delayed oral transit;Holding of bolus;Lingual pumping;Other (Comment) Oral - Mech Soft -- Oral - Regular -- Oral - Multi-Consistency -- Oral - Pill -- Oral Phase - Comment --  CHL IP PHARYNGEAL PHASE 01/30/2020 Pharyngeal Phase Impaired Pharyngeal- Pudding Teaspoon -- Pharyngeal -- Pharyngeal- Pudding Cup -- Pharyngeal -- Pharyngeal-  Honey Teaspoon -- Pharyngeal -- Pharyngeal- Honey Cup -- Pharyngeal -- Pharyngeal- Nectar Teaspoon -- Pharyngeal -- Pharyngeal- Nectar Cup -- Pharyngeal -- Pharyngeal- Nectar Straw WFL Pharyngeal -- Pharyngeal- Thin Teaspoon -- Pharyngeal -- Pharyngeal- Thin Cup [facility] Pharyngeal Material enters airway, CONTACTS cords and not ejected out Pharyngeal- Thin Straw Penetration/Apiration after swallow;Trace aspiration Pharyngeal Material enters airway, CONTACTS cords and not ejected out Pharyngeal- Puree -- Pharyngeal -- Pharyngeal- Mechanical Soft -- Pharyngeal -- Pharyngeal- Regular -- Pharyngeal -- Pharyngeal- Multi-consistency -- Pharyngeal -- Pharyngeal- Pill -- Pharyngeal -- Pharyngeal Comment --  No flowsheet data found. Harlon Ditty, MA CCC-SLP Acute Rehabilitation Services Pager [phone] Office [phone] [name] 01/30/2020, 1:56 PM              IR EMBO ART  VEN HEMORR LYMPH EXTRAV  INC GUIDE ROADMAPPING  Result Date: 01/15/2020 CLINICAL DATA:  GI bleed. 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 EXAM: SELECTIVE VISCERAL ARTERIOGRAPHY; ADDITIONAL ARTERIOGRAPHY; IR ULTRASOUND GUIDANCE VASC ACCESS RIGHT; IR EMBO ART VEN HEMORR LYMPH EXTRAV INC GUIDE ROADMAPPING ANESTHESIA/SEDATION: Patient was intubated and receiving IV fentanyl  MEDICATIONS: Lidocaine 1% subcutaneous CONTRAST:  Omnipaque 300 IA PROCEDURE: The procedure was performed with implied emergent consent. Right femoral region prepped and draped in usual sterile fashion. Maximal barrier sterile technique was utilized including caps, mask, sterile gowns, sterile gloves, sterile drape, hand hygiene and skin antiseptic. The right common femoral artery was localized under ultrasound. Under real-time ultrasound guidance, the vessel was accessed with a 21-gauge micropuncture needle, exchanged over a 018 guidewire for a transitional dilator, through which a 035 guidewire was advanced. Over this, a 5 [place] vascular  sheath was placed, through which a 5 [place] C2 catheter was advanced and used to selectively catheterize the celiac axis for selective arteriography. This was exchanged for a 5 [place] Sos catheter. Through this, a coaxial microcatheter with 016 guidewire were advanced across the lesion in the mid splenic artery. Coil embolization immediately distal to the lesion performed with 2, 3, and 4 mm interlock coils. Separately, 5 mm coils x2 placed in the pseudoaneurysm. Separately, 2, 3, and 4 mm interlock coils were used in the native splenic artery immediately proximal to the lesion. Hemostasis of flow was achieved. Follow-up arteriogram was performed. The catheter was removed. Because of coagulopathy, sheath was secured with 0 silk suture and placed to flush system. The patient tolerated the procedure well. 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 Technically successful coil embolization of the splenic artery immediately distal and proximal to the lesion. IMPRESSION: 1. 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 Electronically Signed   By: Corlis Leak M.D.   On: 01/15/2020 09:49   CT IMAGE GUIDED DRAINAGE PERCUT CATH  PERITONEAL RETROPERIT  Result Date: 02/07/2020 INDICATION: Splenectomy bed abscess by CT EXAM: CT drainage left upper quadrant splenectomy bed drain placement MEDICATIONS: The patient is currently admitted to the hospital and receiving intravenous antibiotics. The antibiotics were administered within an appropriate time frame prior to the initiation of the procedure. ANESTHESIA/SEDATION: Fentanyl 50 mcg IV; Versed 0.5 mg IV Moderate Sedation Time:  19 minutes The patient was continuously monitored during the procedure by the interventional radiology nurse under my direct supervision. COMPLICATIONS: None immediate. PROCEDURE:  Informed written consent was obtained from the patient after a thorough discussion of the procedural risks, benefits and alternatives. All questions were addressed. Maximal Sterile Barrier Technique was utilized including caps, mask, sterile gowns, sterile gloves, sterile drape, hand hygiene and skin antiseptic. A timeout was performed prior to the initiation of the procedure. Previous imaging reviewed. Patient positioned left anterior oblique. Noncontrast localization CT performed. The left upper quadrant splenic bed abscess was localized and marked. Under sterile conditions and local anesthesia, an 18 gauge 10 cm access was advanced into the fluid collection. Needle position confirmed with CT. Syringe aspiration yielded bloody exudative malodorous fluid compatible with infected hematoma. Sample sent for culture. Guidewire inserted followed by tract dilatation to insert a 12 [place] drain. Drain catheter position confirmed with CT. Catheter secured with a prolene suture and connected to external suction bulb. Sterile dressing applied. No immediate complication. Patient tolerated the procedure well. IMPRESSION: Successful CT-guided left upper quadrant splenic bed abscess drain placement as above. Electronically Signed   By: Judie Petit.  Shick M.D.   On: 02/07/2020 12:18   VAS [place] UPPER EXTREMITY VENOUS DUPLEX  Result Date: 01/23/2020 UPPER VENOUS STUDY  Indications: Edema Limitations: Bandages, poor ultrasound/tissue interface and edema. Comparison Study: No prior study on file for comparison  Performing Technologist: Sherren Kernsandace Kanady RVS  Examination Guidelines: A complete evaluation includes B-mode imaging, spectral Doppler, color Doppler, and power Doppler as needed of all accessible portions of each vessel. Bilateral testing is considered an integral part of a complete examination. Limited examinations for reoccurring indications may be performed as noted.  Right Findings:  +----------+------------+---------+-----------+----------+-----------------+ RIGHT     CompressiblePhasicitySpontaneousProperties     Summary      +----------+------------+---------+-----------+----------+-----------------+ IJV                                                  Not visualized   +----------+------------+---------+-----------+----------+-----------------+ Subclavian    Full       Yes       Yes                                +----------+------------+---------+-----------+----------+-----------------+ Axillary      Full       Yes       Yes                                +----------+------------+---------+-----------+----------+-----------------+ Brachial      Full       Yes       Yes                                +----------+------------+---------+-----------+----------+-----------------+ Cephalic      None                                        Acute       +----------+------------+---------+-----------+----------+-----------------+ Basilic       None                                  Age Indeterminate +----------+------------+---------+-----------+----------+-----------------+  Left Findings: +----------+------------+---------+-----------+----------+-------+ LEFT      CompressiblePhasicitySpontaneousPropertiesSummary +----------+------------+---------+-----------+----------+-------+ Subclavian    Full       Yes       Yes                      +----------+------------+---------+-----------+----------+-------+  Summary:  Right: No evidence of deep vein thrombosis in the upper extremity. Findings consistent with acute superficial vein thrombosis involving the right cephalic vein. Findings consistent with age indeterminate superficial vein thrombosis involving the right basilic vein.  Left: No evidence of thrombosis in the subclavian.  *See table(s) above for measurements and observations.  Diagnosing physician: Fabienne Brunsharles Fields MD Electronically  signed by Fabienne Brunsharles Fields MD on 01/23/2020 at 7:32:31 PM.    Final        Subjective: Patient seen and examined at bedside.  She is currently on Dilaudid drip and feels that her pain is a little better controlled.  Discharge Exam: Vitals:   02/09/20 2304 02/10/20 0710  BP: 103/63 92/65  Pulse: 97 91  Resp: 18 17  Temp: 98.8 F (37.1 C) 98.7 F (37.1 C)  SpO2: 100% (!) 89%    General: Chronically ill looking female lying in bed.  No distress. Cardiovascular: rate controlled, S1/S2 +  Respiratory: bilateral decreased breath sounds at bases with some scattered crackles Abdominal: Soft, mildly tender, ND, bowel sounds + Extremities: Trace lower extremity edema, no cyanosis    The results of significant diagnostics from this hospitalization (including imaging, microbiology, ancillary and laboratory) are listed below for reference.     Microbiology: Recent Results (from the past 240 hour(s))  Aerobic/Anaerobic Culture (surgical/deep wound)     Status: None (Preliminary result)   Collection Time: 02/07/20 11:12 AM   Specimen: Abscess  Result Value Ref Range Status   Specimen Description ABSCESS  Final   Special Requests Normal  Final   Gram Stain   Final    MODERATE WBC PRESENT, PREDOMINANTLY PMN ABUNDANT GRAM POSITIVE COCCI IN CHAINS ABUNDANT GRAM NEGATIVE RODS MODERATE GRAM POSITIVE RODS    Culture   Final    FEW KLEBSIELLA PNEUMONIAE FEW ENTEROBACTER AEROGENES MODERATE ENTEROCOCCUS FAECALIS RARE STAPHYLOCOCCUS AUREUS SUSCEPTIBILITIES TO FOLLOW HOLDING FOR POSSIBLE ANAEROBE Performed at [facility], [address], [place], [place] [address]    Report Status PENDING  Incomplete   Organism ID, Bacteria KLEBSIELLA PNEUMONIAE  Final   Organism ID, Bacteria ENTEROBACTER AEROGENES  Final   Organism ID, Bacteria ENTEROCOCCUS FAECALIS  Final      Susceptibility   Enterobacter aerogenes - MIC*    CEFAZOLIN >=64 RESISTANT Resistant     CEFEPIME <=1 SENSITIVE  Sensitive     CEFTAZIDIME >=64 RESISTANT Resistant     CEFTRIAXONE 16 INTERMEDIATE Intermediate     CIPROFLOXACIN <=0.25 SENSITIVE Sensitive     GENTAMICIN <=1 SENSITIVE Sensitive     IMIPENEM 0.5 SENSITIVE Sensitive     TRIMETH/SULFA <=20 SENSITIVE Sensitive     PIP/TAZO 32 INTERMEDIATE Intermediate     * FEW ENTEROBACTER AEROGENES   Enterococcus faecalis - MIC*    AMPICILLIN <=2 SENSITIVE Sensitive     VANCOMYCIN 1 SENSITIVE Sensitive     GENTAMICIN SYNERGY SENSITIVE Sensitive     * MODERATE ENTEROCOCCUS FAECALIS   Klebsiella pneumoniae - MIC*    AMPICILLIN >=32 RESISTANT Resistant     CEFAZOLIN <=4 SENSITIVE Sensitive     CEFEPIME <=1 SENSITIVE Sensitive     CEFTAZIDIME <=1 SENSITIVE Sensitive     CEFTRIAXONE <=1 SENSITIVE Sensitive     CIPROFLOXACIN <=0.25 SENSITIVE Sensitive     GENTAMICIN <=1 SENSITIVE Sensitive     IMIPENEM <=0.25 SENSITIVE Sensitive     TRIMETH/SULFA <=20 SENSITIVE Sensitive     AMPICILLIN/SULBACTAM 16 INTERMEDIATE Intermediate     PIP/TAZO 16 SENSITIVE Sensitive     * FEW KLEBSIELLA PNEUMONIAE     Labs: BNP (last 3 results) Recent Labs    01/23/20 0528  BNP 268.6*   Basic Metabolic Panel: Recent Labs  Lab 02/05/20 0500 02/08/20 0500 02/09/20 0325  NA 137 138 137  K 4.1 4.0 4.0  CL 108 105 105  CO2 23 25 24   GLUCOSE 142* 158* 141*  BUN 37* 40* 31*  CREATININE 1.06* 0.88 0.83  CALCIUM 7.7* 7.6* 7.4*  MG 1.4* 1.5* 1.6*  PHOS 3.6 3.1  --    Liver Function Tests: Recent Labs  Lab 02/05/20 0500 02/08/20 0500 02/09/20 0325  AST 19 20 27   ALT 20 21 23   ALKPHOS 90 86 93  BILITOT 0.3 0.3 0.5  PROT 4.2* 4.3* 4.8*  ALBUMIN <1.0* 1.1* 1.2*   No results for input(s): LIPASE, AMYLASE in the last 168 hours. No results for input(s): AMMONIA in the last 168 hours. CBC: Recent Labs  Lab 02/05/20  0500 02/08/20 0500 02/08/20 1504 02/09/20 0325  WBC 35.3* 22.6* 23.5* 19.2*  NEUTROABS  --   --   --  15.4*  HGB 7.9* 6.8* 8.6* 9.4*  HCT  25.3* 22.3* 27.1* 29.9*  MCV 97.3 98.2 96.1 96.5  PLT 622* 687* 672* 681*   Cardiac Enzymes: No results for input(s): CKTOTAL, CKMB, CKMBINDEX, TROPONINI in the last 168 hours. BNP: Invalid input(s): POCBNP CBG: Recent Labs  Lab 02/08/20 2349 02/09/20 0311 02/09/20 0758 02/09/20 1231 02/09/20 1553  GLUCAP 130* 143* 105* 118* 110*   D-Dimer No results for input(s): DDIMER in the last 72 hours. Hgb A1c No results for input(s): HGBA1C in the last 72 hours. Lipid Profile No results for input(s): CHOL, HDL, LDLCALC, TRIG, CHOLHDL, LDLDIRECT in the last 72 hours. Thyroid function studies No results for input(s): TSH, T4TOTAL, T3FREE, THYROIDAB in the last 72 hours.  Invalid input(s): FREET3 Anemia work up No results for input(s): VITAMINB12, FOLATE, FERRITIN, TIBC, IRON, RETICCTPCT in the last 72 hours. Urinalysis    Component Value Date/Time   COLORURINE YELLOW 01/20/2020 2200   APPEARANCEUR CLOUDY (A) 01/20/2020 2200   LABSPEC 1.015 01/20/2020 2200   PHURINE 5.0 01/20/2020 2200   GLUCOSEU NEGATIVE 01/20/2020 2200   HGBUR LARGE (A) 01/20/2020 2200   BILIRUBINUR NEGATIVE 01/20/2020 2200   KETONESUR NEGATIVE 01/20/2020 2200   PROTEINUR 30 (A) 01/20/2020 2200   NITRITE NEGATIVE 01/20/2020 2200   LEUKOCYTESUR SMALL (A) 01/20/2020 2200   Sepsis Labs Invalid input(s): PROCALCITONIN,  WBC,  LACTICIDVEN Microbiology Recent Results (from the past 240 hour(s))  Aerobic/Anaerobic Culture (surgical/deep wound)     Status: None (Preliminary result)   Collection Time: 02/07/20 11:12 AM   Specimen: Abscess  Result Value Ref Range Status   Specimen Description ABSCESS  Final   Special Requests Normal  Final   Gram Stain   Final    MODERATE WBC PRESENT, PREDOMINANTLY PMN ABUNDANT GRAM POSITIVE COCCI IN CHAINS ABUNDANT GRAM NEGATIVE RODS MODERATE GRAM POSITIVE RODS    Culture   Final    FEW KLEBSIELLA PNEUMONIAE FEW ENTEROBACTER AEROGENES MODERATE ENTEROCOCCUS FAECALIS RARE  STAPHYLOCOCCUS AUREUS SUSCEPTIBILITIES TO FOLLOW HOLDING FOR POSSIBLE ANAEROBE Performed at [facility], [address] [place], [place] [address]    Report Status PENDING  Incomplete   Organism ID, Bacteria KLEBSIELLA PNEUMONIAE  Final   Organism ID, Bacteria ENTEROBACTER AEROGENES  Final   Organism ID, Bacteria ENTEROCOCCUS FAECALIS  Final      Susceptibility   Enterobacter aerogenes - MIC*    CEFAZOLIN >=64 RESISTANT Resistant     CEFEPIME <=1 SENSITIVE Sensitive     CEFTAZIDIME >=64 RESISTANT Resistant     CEFTRIAXONE 16 INTERMEDIATE Intermediate     CIPROFLOXACIN <=0.25 SENSITIVE Sensitive     GENTAMICIN <=1 SENSITIVE Sensitive     IMIPENEM 0.5 SENSITIVE Sensitive     TRIMETH/SULFA <=20 SENSITIVE Sensitive     PIP/TAZO 32 INTERMEDIATE Intermediate     * FEW ENTEROBACTER AEROGENES   Enterococcus faecalis - MIC*    AMPICILLIN <=2 SENSITIVE Sensitive     VANCOMYCIN 1 SENSITIVE Sensitive     GENTAMICIN SYNERGY SENSITIVE Sensitive     * MODERATE ENTEROCOCCUS FAECALIS   Klebsiella pneumoniae - MIC*    AMPICILLIN >=32 RESISTANT Resistant     CEFAZOLIN <=4 SENSITIVE Sensitive     CEFEPIME <=1 SENSITIVE Sensitive     CEFTAZIDIME <=1 SENSITIVE Sensitive     CEFTRIAXONE <=1 SENSITIVE Sensitive     CIPROFLOXACIN <=0.25 SENSITIVE  Sensitive     GENTAMICIN <=1 SENSITIVE Sensitive     IMIPENEM <=0.25 SENSITIVE Sensitive     TRIMETH/SULFA <=20 SENSITIVE Sensitive     AMPICILLIN/SULBACTAM 16 INTERMEDIATE Intermediate     PIP/TAZO 16 SENSITIVE Sensitive     * FEW KLEBSIELLA PNEUMONIAE     Time coordinating discharge: 35 minutes  SIGNED:   Glade Lloyd, MD  [facility] 02/10/2020, 10:08 AM

## 2020-02-10 NOTE — Progress Notes (Signed)
[facility] [place] [facility]:  Spoke to pt's daughter Jamesetta Orleans and explained hospice care and transition from hospital to Hospice home in [place]. She states her moms goals are comfort and she understands we are looking a short time frame.  She will sign paperwork at our office. She has been approved by our MD and can transport today. WE are hoping for a 430pm pick up time. Norm Parcel RN [phone]

## 2020-02-11 LAB — AEROBIC/ANAEROBIC CULTURE W GRAM STAIN (SURGICAL/DEEP WOUND): Special Requests: NORMAL

## 2020-02-14 ENCOUNTER — Other Ambulatory Visit (HOSPITAL_COMMUNITY): Payer: Medicare Other

## 2020-03-06 DEATH — deceased

## 2021-07-27 IMAGING — XA IR ANGIO/VISCERAL SELECTIVE EA VESSEL WO/W FLUSH
7 series · 14 of 24 positions shown · IV contrast (IODINE)
Comparison: none

CLINICAL DATA: GI bleed. CTA shows splenic arterial pseudoaneurysm
with active extravasation.

[Series 1: body 4 care · 1 of 4 slices shown (1 of 7)]
[im 1/4]
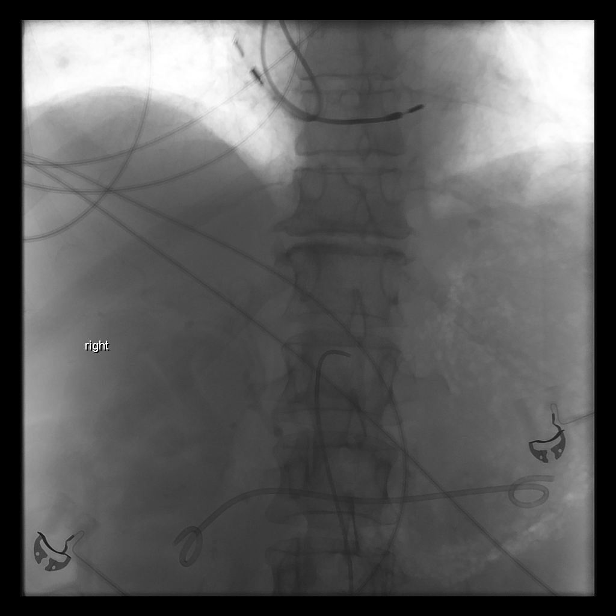

[Series 2: body 4 care · 1 of 6 slices shown (2 of 7)]
[im 3/6]
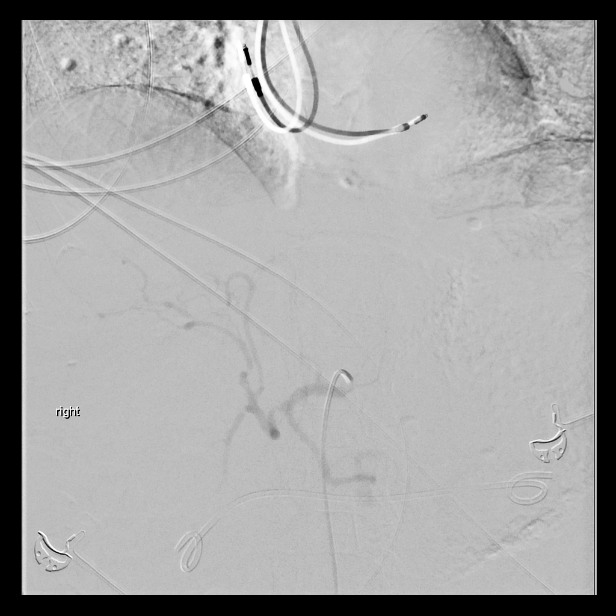

[Series 3: body 4 care · 1 of 5 slices shown (3 of 7)]
[im 1/5]
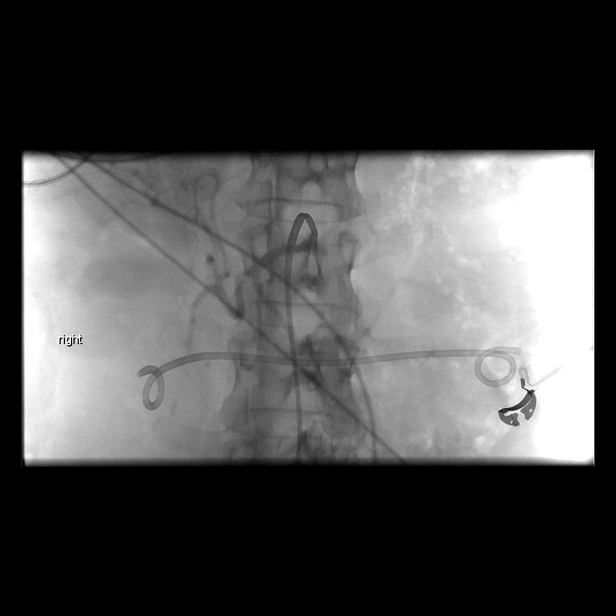

[Series 4: body 4 care · 2 of 4 slices shown (4 of 7)]
[im 1/4]
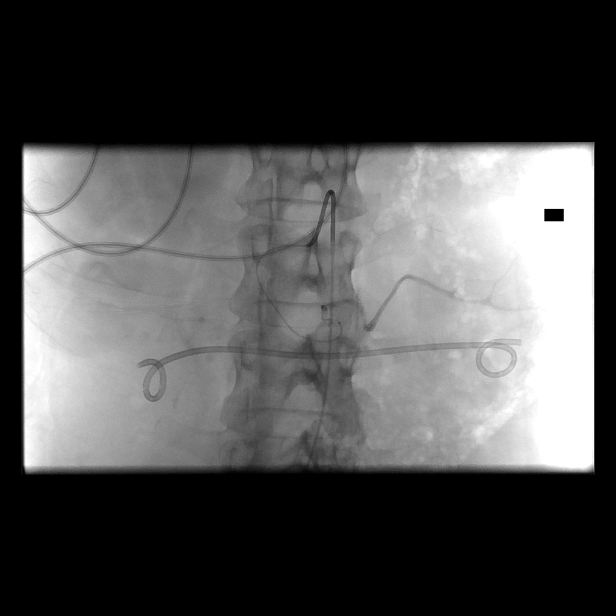
[im 4/4]
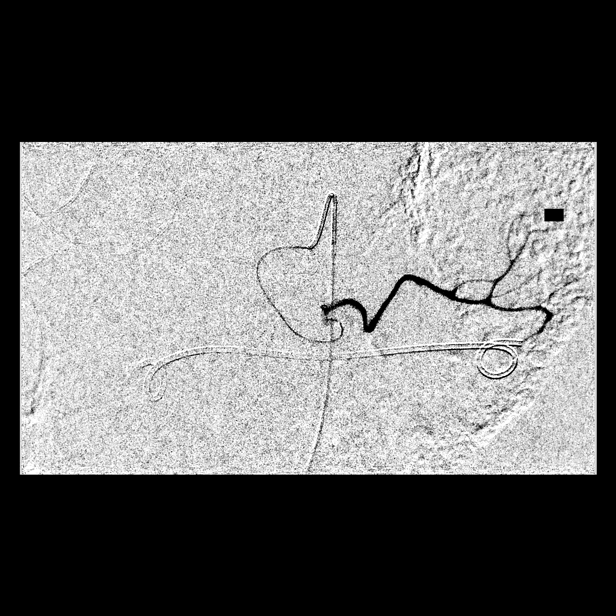

[Series 5: body 4 care · 3 of 14 slices shown (5 of 7)]
[im 3/14]
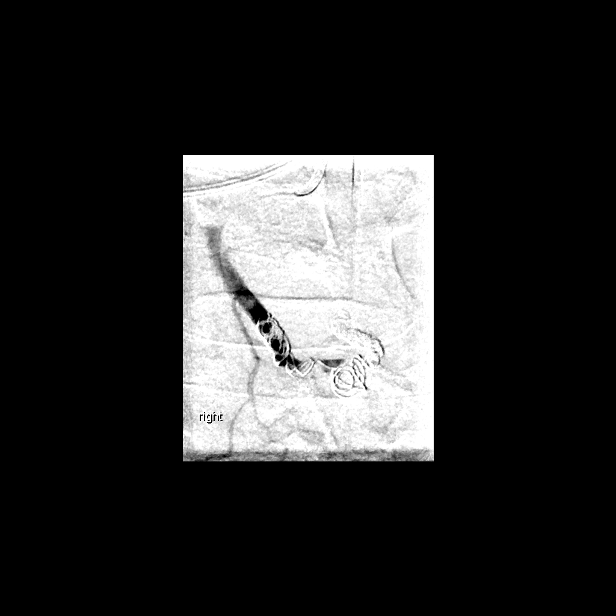
[im 8/14]
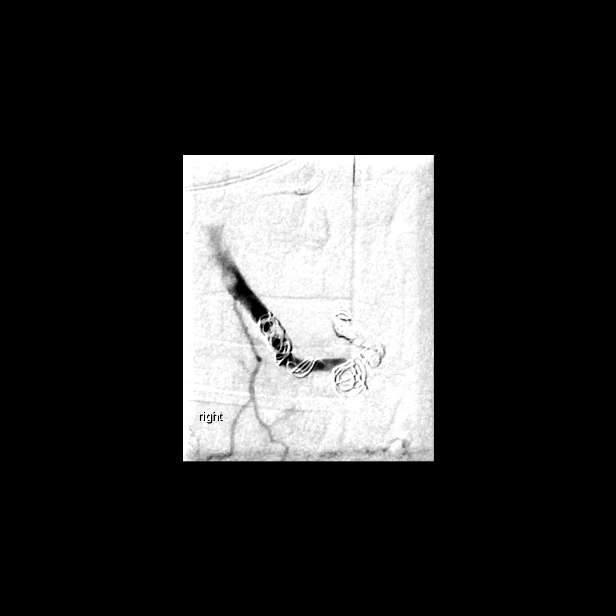
[im 11/14]
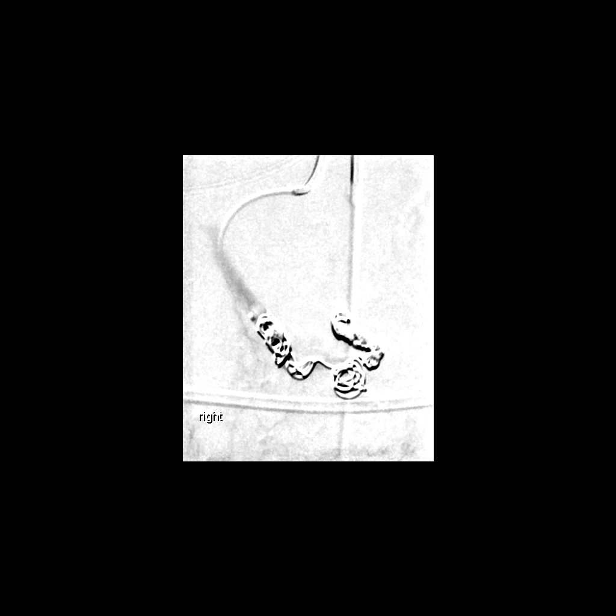

[Series 6: body 4 care · 4 of 13 slices shown (6 of 7)]
[im 1/13]
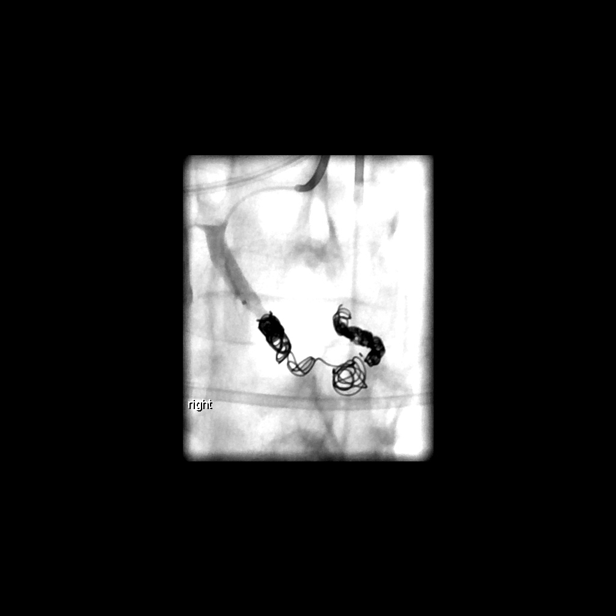
[im 5/13]
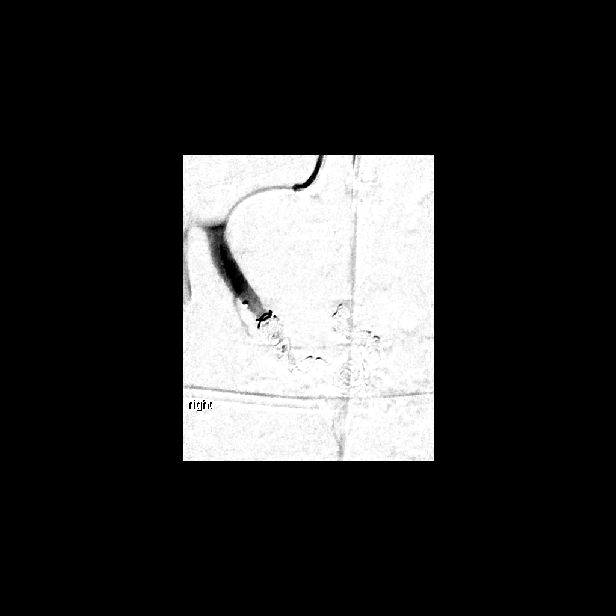
[im 10/13]
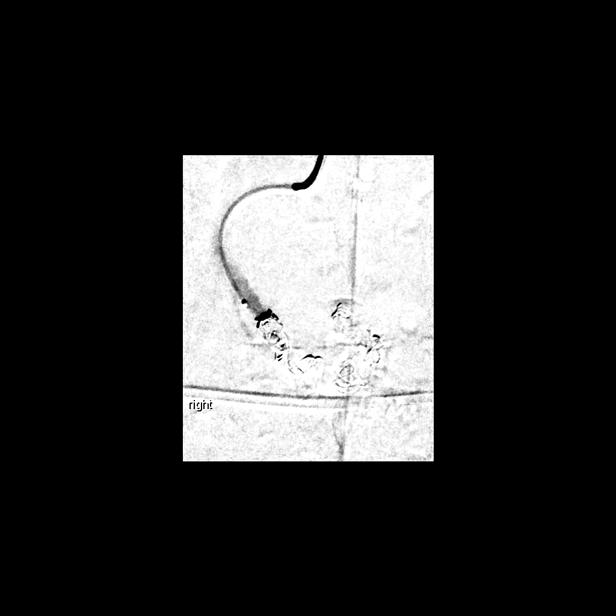
[im 13/13]
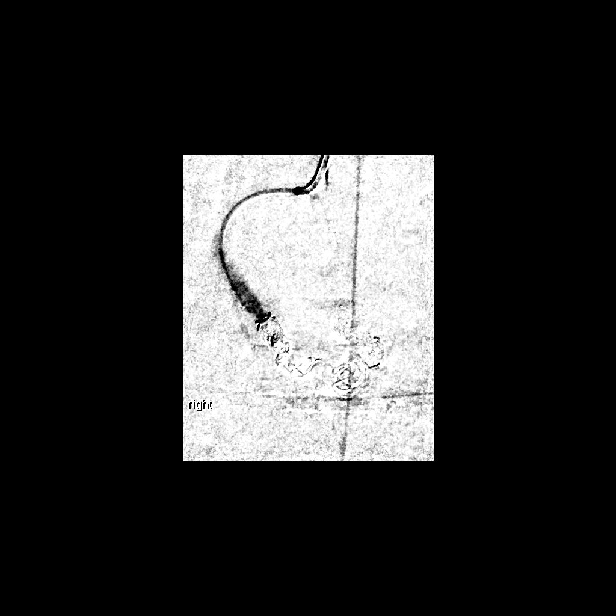

[Series 7: body 4 care · 2 of 8 slices shown (7 of 7)]
[im 3/8]
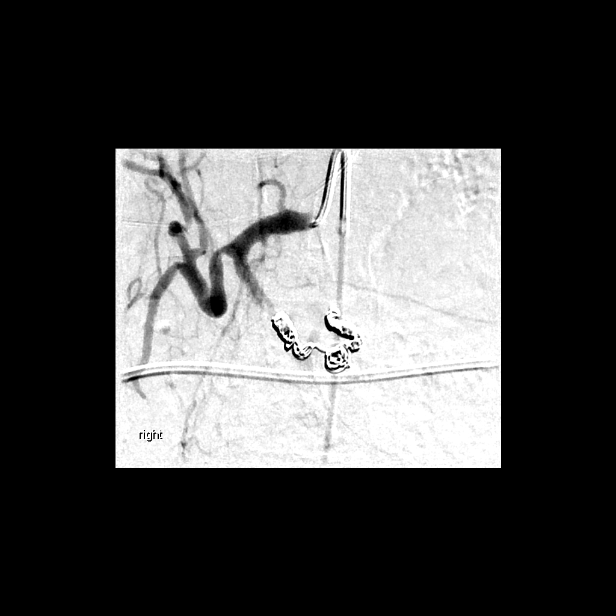
[im 8/8]
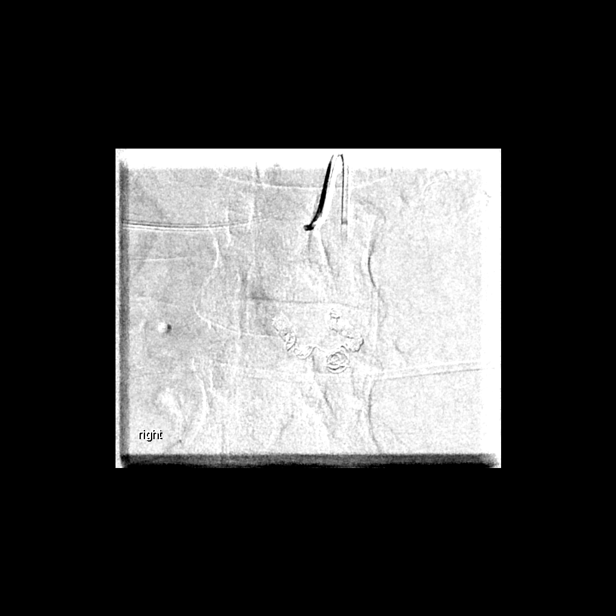

[14 of 24 positions shown; findings below may reference images not displayed]

EXAM:
SELECTIVE VISCERAL ARTERIOGRAPHY; ADDITIONAL ARTERIOGRAPHY; IR
ULTRASOUND GUIDANCE VASC ACCESS RIGHT; IR INGUNN HARPA RONLOR HEMORR LYMPH
EXTRAV INC GUIDE ROADMAPPING

ANESTHESIA/SEDATION:
Patient was intubated and receiving IV fentanyl

MEDICATIONS:
Lidocaine 1% subcutaneous

CONTRAST:  Omnipaque 300 IA

PROCEDURE:
The procedure was performed with implied emergent consent. Right
femoral region prepped and draped in usual sterile fashion. Maximal
barrier sterile technique was utilized including caps, mask, sterile
gowns, sterile gloves, sterile drape, hand hygiene and skin
antiseptic.

The right common femoral artery was localized under ultrasound.
Under real-time ultrasound guidance, the vessel was accessed with a
21-gauge micropuncture needle, exchanged over a 018 guidewire for a
transitional dilator, through which a 035 guidewire was advanced.
Over this, a 5 French vascular sheath was placed, through which a 5
French C2 catheter was advanced and used to selectively catheterize
the celiac axis for selective arteriography. This was exchanged for
a 5 French Sos catheter. Through this, a coaxial microcatheter with
016 guidewire were advanced across the lesion in the mid splenic
artery. Coil embolization immediately distal to the lesion performed
with 2, 3, and 4 mm interlock coils. Separately, 5 mm coils x2
placed in the pseudoaneurysm. Separately, 2, 3, and 4 mm interlock
coils were used in the native splenic artery immediately proximal to
the lesion. Hemostasis of flow was achieved. Follow-up arteriogram
was performed.

The catheter was removed. Because of coagulopathy, sheath was
secured with 0 silk suture and placed to flush system.

The patient tolerated the procedure well.

COMPLICATIONS:
None immediate
FINDINGS: Pseudoaneurysm from the midportion of the splenic artery,
corresponding to site of active extravasation on CTA.

Technically successful coil embolization of the splenic artery
immediately distal and proximal to the lesion.
IMPRESSION: 1. Technically successful localization and embolization of bleeding
pseudoaneurysm in the mid splenic artery.
